# Patient Record
Sex: Male | Born: 1955 | Race: White | Hispanic: No | Smoking: Former smoker
Health system: Southern US, Community
[De-identification: ages and names within clinical notes are randomized; demographics above are authoritative.]

## PROBLEM LIST (undated history)

## (undated) DIAGNOSIS — F1010 Alcohol abuse, uncomplicated: Secondary | ICD-10-CM

## (undated) DIAGNOSIS — I4819 Other persistent atrial fibrillation: Secondary | ICD-10-CM

## (undated) DIAGNOSIS — I10 Essential (primary) hypertension: Secondary | ICD-10-CM

## (undated) DIAGNOSIS — I2510 Atherosclerotic heart disease of native coronary artery without angina pectoris: Secondary | ICD-10-CM

## (undated) DIAGNOSIS — F419 Anxiety disorder, unspecified: Secondary | ICD-10-CM

## (undated) DIAGNOSIS — R7303 Prediabetes: Secondary | ICD-10-CM

## (undated) DIAGNOSIS — I5022 Chronic systolic (congestive) heart failure: Secondary | ICD-10-CM

## (undated) DIAGNOSIS — R7989 Other specified abnormal findings of blood chemistry: Secondary | ICD-10-CM

## (undated) DIAGNOSIS — I499 Cardiac arrhythmia, unspecified: Secondary | ICD-10-CM

## (undated) HISTORY — PX: HERNIA REPAIR: SHX51

---

## 2012-06-09 ENCOUNTER — Ambulatory Visit: Payer: Self-pay | Admitting: Family Medicine

## 2012-06-09 VITALS — BP 138/88 | HR 76 | Temp 98.6°F | Resp 18 | Ht 73.0 in | Wt 377.6 lb

## 2012-06-09 DIAGNOSIS — Z021 Encounter for pre-employment examination: Secondary | ICD-10-CM

## 2012-06-09 DIAGNOSIS — Z0289 Encounter for other administrative examinations: Secondary | ICD-10-CM

## 2012-06-09 NOTE — Progress Notes (Signed)
     Patient Name: Arthur Church Date of Birth: 11/06/1956 Medical Record Number: [id number] Gender: male Date of Encounter: 06/09/2012  History of Present Illness:  Arthur Church is a 56 y.o. very pleasant male patient who presents with the following:  Here for self- pay DOT.  He is aware that he needs to lose weight.  Is getting back to formal work- he had been taking care of his grandkids for the past year, during which time he gained a lot of weight.  He did check that he tends to snore on his sheet- his wife noted that when he lost a significant amount of weight in the past that his snoring resolved.  However, he does not have daytime somnolence or known OSA. He has never been diagnosed with HTN  There is no problem list on file for this patient.  No past medical history on file. No past surgical history on file. History  Substance Use Topics  . Smoking status: Current Everyday Smoker    Types: Cigarettes  . Smokeless tobacco: Not on file   Comment: smokes about a week  . Alcohol Use: Not on file   No family history on file. No Known Allergies  Medication list has been reviewed and updated.  Prior to Admission medications   Medication Sig Start Date End Date Taking? Authorizing Provider  pseudoephedrine (SUDAFED) 30 MG tablet Take 30 mg by mouth every 4 (four) hours as needed.   Yes Historical Provider, MD    Review of Systems:  As per HPI- otherwise negative. Please see DOT history and exam form  Physical Examination: Filed Vitals:   06/09/12 1200  BP: 158/95  Pulse:   Temp:   Resp:    Filed Vitals:   06/09/12 1154  Height: 6\' 1"  (1.854 m)  Weight: 377 lb 9.6 oz (171.278 kg)   Body mass index is 49.82 kg/(m^2). Ideal Body Weight: Weight in (lb) to have BMI = 25: 189.1  Note recheck O2 sat 95%, done on pinky finger.  I think his other fingers are too large for probe to read properly   GEN: WDWN, NAD, Non-toxic, A & O x 3, morbid obesity HEENT:  Atraumatic, Normocephalic. Neck supple. No masses, No LAD.  TM and oropharynx wnl, PEERL Ears and Nose: No external deformity. CV: RRR, No M/G/R. No JVD. No thrill. No extra heart sounds. PULM: CTA B, no wheezes, crackles, rhonchi. No retractions. No resp. distress. No accessory muscle use. ABD: S, NT, ND, +BS. No rebound. No HSM. No inguinal hernia EXTR: No c/c/e NEURO Normal gait.  Full strength and normal DTR all extremities PSYCH: Normally interactive. Conversant. Not depressed or anxious appearing.  Calm demeanor.   Assessment and Plan: 1. Physical exam, pre-employment    Mr. Nobrega fulfills the DOT requirements for certification.  Will certify for only one year as he needs to attend to his weight, his BP is borderline, and he probably needs to have a sleep study to ensure that he does not have sleep apnea.  He plans to take care of these issues as soon as possible, and to establish with a local primary care provider.    Abbe Amsterdam, MD

## 2013-05-24 ENCOUNTER — Ambulatory Visit: Payer: Self-pay | Admitting: Family Medicine

## 2013-05-24 VITALS — BP 138/78 | HR 86 | Temp 98.5°F | Resp 16 | Ht 72.0 in | Wt 358.0 lb

## 2013-05-24 DIAGNOSIS — Z0289 Encounter for other administrative examinations: Secondary | ICD-10-CM

## 2013-05-24 NOTE — Progress Notes (Signed)
Subjective:    Patient ID: Arthur Church, male    DOB: [birth year], 57 y.o.   MRN: [id number]  HPI Arthur Church is a 57 y.o. male  here for DOT exam.   Last ov 06/09/12 - 1 year card given.  Went on diet as weight up to 425 in March - has lost about 50 pounds with Renaldo Fiddler Diet.  Weight 377 last year, 358 today. Denies recent daytime somnolence, no recent snoring - per wife. (stopped snoring past month, no daytime fatigue or naps).  Per notes at visit last year -  Certified for one year as BP borderline(lowest reading 138/88), and possible sleep study, as well as weight loss. Has not established care with primary provider. Has lost weight as above - 377 to 358.   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   Moved from [place] 2 years ago.   Review of Systems  Constitutional: Negative for fatigue.  Eyes: Negative for visual disturbance.  Respiratory: Negative for chest tightness.   Cardiovascular: Negative for chest pain.  Genitourinary: Negative for difficulty urinating.  Neurological: Negative for weakness.      Objective:   Physical Exam  Vitals reviewed. Constitutional: He is oriented to person, place, and time. He appears well-developed and well-nourished.  Obese.   HENT:  Head: Normocephalic and atraumatic.  Right Ear: External ear normal.  Left Ear: External ear normal.  Mouth/Throat: Oropharynx is clear and moist.  Eyes: Conjunctivae and lids are normal. Pupils are equal, round, and reactive to light. No foreign bodies found. 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 ). Left eye exhibits abnormal extraocular motion.  Neck: Normal range of motion. Neck supple. No thyromegaly present.  Cardiovascular: Normal rate, regular rhythm, normal heart sounds, intact distal pulses and normal pulses.   No murmur heard. Pulmonary/Chest: Effort normal and breath  sounds normal. No respiratory distress. He has no wheezes.  Abdominal: Soft. He exhibits no distension. There is no tenderness. Hernia confirmed negative in the right inguinal area and confirmed negative in the left inguinal area.  Genitourinary: Prostate normal.  Musculoskeletal: Normal range of motion. He exhibits no edema and no tenderness.       Cervical back: He exhibits normal range of motion.  Lymphadenopathy:    He has no cervical adenopathy.  Neurological: He is alert and oriented to person, place, and time. He has normal reflexes.  Skin: Skin is warm and dry.  Psychiatric: He has a normal mood and affect. His behavior is normal.   Filed Vitals:   05/24/13 1539 05/24/13 1557  BP: 182/108 168/80  Pulse: 86   Temp: 98.5 F (36.9 C)   TempSrc: Oral   Resp: 16   Height: 6' (1.829 m)   Weight: 358 lb (162.388 kg)   SpO2: 98%       Assessment & Plan:  Arthur Church is a 57 y.o. male  Health examination of defined subpopulation   DOT physical - elevated BP initially. Reviewed prior note on plan, and has not yet setup primary care, but has lost weight - and continuing to work on diet.  recheck BP in passing range, vision borderline but passes.  Denies any recent snoring or daytime fatigue.  recommended primary care for follow up of hematuria/proteinuria and borderline BP. 1 year card. See scanned paperwork.   Patient Instructions  1 year DOT card.  Schedule  primary care follow up in next 6 weeks if possible for borderline blood pressure, trace blood and protein in urine. Keep a record of your blood pressures outside of the office and bring them to the next office visit.

## 2013-05-24 NOTE — Patient Instructions (Addendum)
1 year DOT card.  Schedule primary care follow up in next 6 weeks if possible for borderline blood pressure, trace blood and protein in urine. Keep a record of your blood pressures outside of the office and bring them to the next office visit.

## 2013-07-01 ENCOUNTER — Encounter: Payer: Self-pay | Admitting: Family Medicine

## 2014-05-19 ENCOUNTER — Ambulatory Visit: Payer: Self-pay | Admitting: Internal Medicine

## 2014-05-19 VITALS — BP 140/84 | HR 81 | Temp 98.5°F | Resp 16 | Ht 71.73 in | Wt 326.1 lb

## 2014-05-19 DIAGNOSIS — Z0289 Encounter for other administrative examinations: Secondary | ICD-10-CM

## 2014-05-19 NOTE — Progress Notes (Signed)
   Subjective:    Patient ID: Arthur Church, male    DOB: 04-30-1956, 58 y.o.   MRN: [id number]  HPI Here for DOT. Has lost 30lbs over last 1 year. Is down 100lbs over last 2-64yrs. No snoring or day time sleepiness. Has no primary care and no health care coverage.   Review of Systems  Constitutional: Negative.   HENT: Negative.   Eyes: Negative.   Respiratory: Negative.   Cardiovascular: Negative.   Gastrointestinal: Negative.   Endocrine: Negative.   Genitourinary: Negative.   Allergic/Immunologic: Negative.   Neurological: Negative.   Hematological: Negative.   Psychiatric/Behavioral: Negative.        Objective:   Physical Exam  Constitutional: He is oriented to person, place, and time. He appears well-nourished. No distress.  HENT:  Head: Normocephalic.  Right Ear: External ear normal.  Left Ear: External ear normal.  Nose: Nose normal.  Mouth/Throat: Oropharynx is clear and moist.  Eyes: Conjunctivae and EOM are normal. Pupils are equal, round, and reactive to light. No scleral icterus.  Neck: Normal range of motion. Neck supple. No tracheal deviation present. No thyromegaly present.  Cardiovascular: Normal rate, regular rhythm and normal heart sounds.   Pulmonary/Chest: Effort normal and breath sounds normal.  Abdominal: Soft. There is no tenderness. Hernia confirmed negative in the right inguinal area and confirmed negative in the left inguinal area.  Genitourinary: Penis normal.  Musculoskeletal: Normal range of motion.  Lymphadenopathy:    He has no cervical adenopathy.  Neurological: He is alert and oriented to person, place, and time. He has normal reflexes. No cranial nerve deficit. He exhibits normal muscle tone. Coordination normal.  Psychiatric: He has a normal mood and affect. His behavior is normal. Judgment and thought content normal.     No results found for this or any previous visit.      Assessment & Plan:  Obesity improving  DOT ok for 2  yrs

## 2014-05-19 NOTE — Progress Notes (Signed)
   Subjective:    Patient ID: Arthur Church, male    DOB: 1956/05/29, 58 y.o.   MRN: [id number]  HPI Arthur Church is here today for his DOT physical. He dosen't have a PCP due to lack of insurance. He states that he continues to lose weight. He states he started at 419 lbs in which he is currently weighing 326.2 lbs. To his knowledge he has no health hx. Arthur Church states that his blood pressure occasionally runs high from time to time. But he hasn't been diagnosed with HTN. He also takes no regular medications. He has no complaints at this time.    Review of Systems     Objective:   Physical Exam        Assessment & Plan:

## 2015-03-04 ENCOUNTER — Inpatient Hospital Stay (HOSPITAL_COMMUNITY)
Admission: EM | Admit: 2015-03-04 | Discharge: 2015-03-10 | DRG: 308 | Disposition: A | Discharge status: Home / Self Care | Payer: 59 | Attending: Cardiology | Admitting: Cardiology

## 2015-03-04 ENCOUNTER — Encounter (HOSPITAL_COMMUNITY): Payer: Self-pay | Admitting: *Deleted

## 2015-03-04 ENCOUNTER — Other Ambulatory Visit (HOSPITAL_COMMUNITY): Payer: Self-pay

## 2015-03-04 ENCOUNTER — Emergency Department (HOSPITAL_COMMUNITY): Payer: 59

## 2015-03-04 DIAGNOSIS — L299 Pruritus, unspecified: Secondary | ICD-10-CM | POA: Diagnosis present

## 2015-03-04 DIAGNOSIS — R7303 Prediabetes: Secondary | ICD-10-CM | POA: Diagnosis present

## 2015-03-04 DIAGNOSIS — Z6841 Body Mass Index (BMI) 40.0 and over, adult: Secondary | ICD-10-CM

## 2015-03-04 DIAGNOSIS — E785 Hyperlipidemia, unspecified: Secondary | ICD-10-CM | POA: Diagnosis present

## 2015-03-04 DIAGNOSIS — I484 Atypical atrial flutter: Secondary | ICD-10-CM

## 2015-03-04 DIAGNOSIS — R197 Diarrhea, unspecified: Secondary | ICD-10-CM | POA: Diagnosis not present

## 2015-03-04 DIAGNOSIS — I4892 Unspecified atrial flutter: Secondary | ICD-10-CM | POA: Diagnosis present

## 2015-03-04 DIAGNOSIS — I5021 Acute systolic (congestive) heart failure: Secondary | ICD-10-CM | POA: Diagnosis present

## 2015-03-04 DIAGNOSIS — R7309 Other abnormal glucose: Secondary | ICD-10-CM | POA: Diagnosis present

## 2015-03-04 DIAGNOSIS — I4891 Unspecified atrial fibrillation: Secondary | ICD-10-CM | POA: Diagnosis present

## 2015-03-04 DIAGNOSIS — I472 Ventricular tachycardia: Secondary | ICD-10-CM | POA: Diagnosis present

## 2015-03-04 DIAGNOSIS — I509 Heart failure, unspecified: Secondary | ICD-10-CM

## 2015-03-04 DIAGNOSIS — R7989 Other specified abnormal findings of blood chemistry: Secondary | ICD-10-CM | POA: Diagnosis present

## 2015-03-04 DIAGNOSIS — R778 Other specified abnormalities of plasma proteins: Secondary | ICD-10-CM | POA: Diagnosis present

## 2015-03-04 DIAGNOSIS — I2510 Atherosclerotic heart disease of native coronary artery without angina pectoris: Secondary | ICD-10-CM | POA: Diagnosis present

## 2015-03-04 DIAGNOSIS — K5900 Constipation, unspecified: Secondary | ICD-10-CM | POA: Diagnosis present

## 2015-03-04 DIAGNOSIS — R079 Chest pain, unspecified: Secondary | ICD-10-CM | POA: Diagnosis not present

## 2015-03-04 DIAGNOSIS — E875 Hyperkalemia: Secondary | ICD-10-CM | POA: Diagnosis present

## 2015-03-04 DIAGNOSIS — I340 Nonrheumatic mitral (valve) insufficiency: Secondary | ICD-10-CM | POA: Diagnosis present

## 2015-03-04 DIAGNOSIS — F1010 Alcohol abuse, uncomplicated: Secondary | ICD-10-CM | POA: Diagnosis present

## 2015-03-04 DIAGNOSIS — I429 Cardiomyopathy, unspecified: Secondary | ICD-10-CM | POA: Diagnosis present

## 2015-03-04 DIAGNOSIS — I200 Unstable angina: Secondary | ICD-10-CM

## 2015-03-04 DIAGNOSIS — Z7901 Long term (current) use of anticoagulants: Secondary | ICD-10-CM

## 2015-03-04 DIAGNOSIS — G4733 Obstructive sleep apnea (adult) (pediatric): Secondary | ICD-10-CM | POA: Diagnosis present

## 2015-03-04 DIAGNOSIS — Z87891 Personal history of nicotine dependence: Secondary | ICD-10-CM

## 2015-03-04 DIAGNOSIS — I10 Essential (primary) hypertension: Secondary | ICD-10-CM | POA: Diagnosis present

## 2015-03-04 HISTORY — DX: Other specified abnormal findings of blood chemistry: R79.89

## 2015-03-04 HISTORY — DX: Prediabetes: R73.03

## 2015-03-04 HISTORY — DX: Alcohol abuse, uncomplicated: F10.10

## 2015-03-04 HISTORY — DX: Essential (primary) hypertension: I10

## 2015-03-04 HISTORY — DX: Morbid (severe) obesity due to excess calories: E66.01

## 2015-03-04 HISTORY — DX: Chronic systolic (congestive) heart failure: I50.22

## 2015-03-04 LAB — BASIC METABOLIC PANEL
Anion gap: 8 (ref 5–15)
BUN: 14 mg/dL (ref 6–23)
CO2: 28 mmol/L (ref 19–32)
Calcium: 9.6 mg/dL (ref 8.4–10.5)
Chloride: 103 mmol/L (ref 96–112)
Creatinine, Ser: 0.84 mg/dL (ref 0.50–1.35)
GFR calc Af Amer: >90 mL/min (ref >90)
Glucose, Bld: 133 mg/dL — ABNORMAL HIGH (ref 70–99)
POTASSIUM: 4.1 mmol/L (ref 3.5–5.1)
SODIUM: 139 mmol/L (ref 135–145)

## 2015-03-04 LAB — CBC
HEMATOCRIT: 45.3 % (ref 39.0–52.0)
Hemoglobin: 15 g/dL (ref 13.0–17.0)
MCH: 29.6 pg (ref 26.0–34.0)
MCHC: 33.1 g/dL (ref 30.0–36.0)
MCV: 89.5 fL (ref 78.0–100.0)
Platelets: 243 10*3/uL (ref 150–400)
RBC: 5.06 MIL/uL (ref 4.22–5.81)
RDW: 13.9 % (ref 11.5–15.5)
WBC: 10.5 10*3/uL (ref 4.0–10.5)

## 2015-03-04 LAB — I-STAT TROPONIN, ED: Troponin i, poc: 0.03 ng/mL (ref 0.00–0.08)

## 2015-03-04 LAB — BRAIN NATRIURETIC PEPTIDE: B Natriuretic Peptide: 410.6 pg/mL — ABNORMAL HIGH (ref 0.0–100.0)

## 2015-03-04 MED ORDER — ASPIRIN 81 MG PO CHEW
324.0000 mg | CHEWABLE_TABLET | Freq: Once | ORAL | Status: AC
  Administered 2015-03-04: 324 mg via ORAL
  Filled 2015-03-04: qty 4

## 2015-03-04 MED ORDER — HEPARIN (PORCINE) IN NACL 100-0.45 UNIT/ML-% IJ SOLN
2000.0000 [IU]/h | INTRAMUSCULAR | Status: DC
  Administered 2015-03-04: 1600 [IU]/h via INTRAVENOUS
  Administered 2015-03-05: 2000 [IU]/h via INTRAVENOUS
  Filled 2015-03-04 (×2): qty 250

## 2015-03-04 MED ORDER — HEPARIN (PORCINE) IN NACL 100-0.45 UNIT/ML-% IJ SOLN: 12.0000 [IU]/kg/h | Freq: Once | INTRAMUSCULAR | Status: DC

## 2015-03-04 MED ORDER — NITROGLYCERIN 2 % TD OINT
1.0000 [in_us] | TOPICAL_OINTMENT | Freq: Once | TRANSDERMAL | Status: AC
  Administered 2015-03-04: 1 [in_us] via TOPICAL
  Filled 2015-03-04: qty 1

## 2015-03-04 MED ORDER — HEPARIN SODIUM (PORCINE) 5000 UNIT/ML IJ SOLN
4000.0000 [IU] | Freq: Once | INTRAMUSCULAR | Status: AC
  Administered 2015-03-04: 4000 [IU] via INTRAVENOUS

## 2015-03-04 NOTE — ED Notes (Signed)
Pt in c/o intermittent chest pain for the last two weeks, worse today, today pt became diaphoretic and pale during pain, denies pain at this time, also reports trouble sleeping at night due to shortness of breath, also worse with exertion, no distress at this time

## 2015-03-04 NOTE — ED Provider Notes (Signed)
CSN: [id number]     Arrival date & time 03/04/15  2009 History   First MD Initiated Contact with Patient 03/04/15 2149     Chief Complaint  Patient presents with  . Chest Pain     (Consider location/radiation/quality/duration/timing/severity/associated sxs/prior Treatment) Patient is a 59 y.o. male presenting with chest pain. The history is provided by the patient.  Chest Pain Pain location:  Substernal area Pain quality: pressure   Pain radiates to:  L arm Pain radiates to the back: no   Pain severity:  Severe Onset quality:  Sudden Duration:  2 hours Timing:  Intermittent Progression:  Resolved Chronicity:  New Context: at rest   Relieved by:  None tried Worsened by:  Nothing tried Ineffective treatments:  None tried Associated symptoms: fatigue, lower extremity edema, nausea, orthopnea and shortness of breath   Associated symptoms: no cough, no palpitations and not vomiting   Risk factors: hypertension and obesity     Past Medical History  Diagnosis Date  . Hypertension    Past Surgical History  Procedure Laterality Date  . Hernia repair     History reviewed. No pertinent family history. History  Substance Use Topics  . Smoking status: Former Smoker    Types: Cigarettes  . Smokeless tobacco: Never Used     Comment: smokes about a week  . Alcohol Use: Yes    Review of Systems  Constitutional: Positive for fatigue.  Respiratory: Positive for shortness of breath. Negative for cough.   Cardiovascular: Positive for chest pain and orthopnea. Negative for palpitations.  Gastrointestinal: Positive for nausea. Negative for vomiting.  All other systems reviewed and are negative.     Allergies  Review of patient's allergies indicates no known allergies.  Home Medications   Prior to Admission medications   Medication Sig Start Date End Date Taking? Authorizing Provider  diphenhydrAMINE (BENADRYL) 25 MG tablet Take 25 mg by mouth every 6 (six) hours as needed  for allergies.   Yes Historical Provider, MD  ibuprofen (ADVIL,MOTRIN) 200 MG tablet Take 600 mg by mouth every 6 (six) hours as needed (pain).   Yes Historical Provider, MD  loratadine (CLARITIN) 10 MG tablet Take 10 mg by mouth daily.   Yes Historical Provider, MD  sodium chloride (OCEAN) 0.65 % SOLN nasal spray Place 1 spray into both nostrils as needed for congestion.   Yes Historical Provider, MD   BP 115/97 mmHg  Pulse 129  Temp(Src) 98 F (36.7 C) (Oral)  Resp 15  Ht 6\' 1"  (1.854 m)  Wt 370 lb (167.831 kg)  BMI 48.83 kg/m2  SpO2 94% Physical Exam  Constitutional: He is oriented to person, place, and time. He appears well-developed and well-nourished. No distress.  Obese male in no acute distress  HENT:  Head: Normocephalic and atraumatic.  Mouth/Throat: Oropharynx is clear and moist. No oropharyngeal exudate.  Eyes: EOM are normal. Pupils are equal, round, and reactive to light.  Neck: Normal range of motion. Neck supple. No thyromegaly present.  Cardiovascular: Normal heart sounds and intact distal pulses.  An irregularly irregular rhythm present. Tachycardia present.  Exam reveals no gallop and no friction rub.   No murmur heard. Pulmonary/Chest: Effort normal and breath sounds normal. No respiratory distress. He has no wheezes. He has no rales.  Abdominal: Soft. He exhibits no distension. There is no tenderness.  Obese  Musculoskeletal: Normal range of motion. He exhibits edema ( 1+ to the knee bilaterally). He exhibits no tenderness.  Lymphadenopathy:  He has no cervical adenopathy.  Neurological: He is alert and oriented to person, place, and time. No cranial nerve deficit.  Skin: Skin is warm and dry. No rash noted. He is not diaphoretic.  Psychiatric: He has a normal mood and affect. His behavior is normal. Judgment and thought content normal.  Nursing note and vitals reviewed.   ED Course  Procedures (including critical care time) Labs Review Labs Reviewed   BASIC METABOLIC PANEL - Abnormal; Notable for the following:    Glucose, Bld 133 (*)    All other components within normal limits  BRAIN NATRIURETIC PEPTIDE - Abnormal; Notable for the following:    B Natriuretic Peptide 410.6 (*)    All other components within normal limits  CBC  HEPARIN LEVEL (UNFRACTIONATED)  CBC  I-STAT TROPOININ, ED    Imaging Review Dg Chest 2 View  03/04/2015   CLINICAL DATA:  Chest pain and difficulty breathing since 4 p.m. today.  EXAM: CHEST  2 VIEW  COMPARISON:  None.  FINDINGS: The heart is borderline enlarged. There is tortuosity and mild ectasia of the thoracic aorta. There is peribronchial thickening and increased interstitial markings which could suggest chronic bronchitis, smoking changes, mild interstitial edema or interstitial pneumonitis. No pleural effusions. Streaky bibasilar atelectasis. The bony thorax is intact.  IMPRESSION: Borderline cardiac enlargement with acute versus chronic interstitial change as detailed above.   Electronically Signed   By: Rudie Meyer M.D.   On: 03/04/2015 20:50     EKG Interpretation   Date/Time:  Monday March 04 2015 20:15:18 EDT Ventricular Rate:  129 PR Interval:    QRS Duration: 90 QT Interval:  402 QTC Calculation: 588 R Axis:   13 Text Interpretation:  Critical Test Result: Long QTc Atrial flutter  with 2:1 A-V conduction Possible Anterior infarct , age undetermined  Marked ST abnormality, possible inferior subendocardial injury Abnormal  ECG Confirmed by BEATON  MD, ROBERT ([id number]) on 03/04/2015 11:21:04 PM      MDM   Final diagnoses:  Unstable angina  Atypical atrial flutter  Acute heart failure, unspecified heart failure type   59 year old male with past medical history of obesity and hypertension presents with chest pain, shortness of breath, orthopnea, lower extremity swelling. One episode 10 days ago was severe should chest pressure with radiation to his arm and diaphoresis and clamminess. He  had 2 episodes similar to that today. Over the last 2 weeks he is also had increasing symptoms of heart failure with virtually swelling, exertional dyspnea, orthopnea.  Atrial flutter with 2-1 conduction noted on arrival with a slower than expected rate at 125. Pain-free on arrival. Full dose aspirin given. Nitropaste also applied. Laboratory workup from triage was negative troponin, however BNP elevated at 410, and sides with ongoing symptoms of heart failure. His 2 episodes of angina today are concerning for unstable angina. Based on that, heparin bolus and infusion given. Cardiology consultation for admission. This patient is high risk for ACS and has evidence of heart failure. Given symptoms, also concern for unstable angina.   Dorna Leitz, MD 03/04/15 1610  Nelva Nay, MD 03/09/15 [phone]

## 2015-03-04 NOTE — Progress Notes (Signed)
ANTICOAGULATION CONSULT NOTE - Initial Consult  Pharmacy Consult for Heparin Indication: chest pain/ACS  No Known Allergies  Patient Measurements: Height: 6\' 1"  (185.4 cm) Weight: (!) 370 lb (167.831 kg) IBW/kg (Calculated) : 79.9 Heparin Dosing Weight: 120 kg  Vital Signs: Temp: 98 F (36.7 C) (03/21 2013) Temp Source: Oral (03/21 2013) BP: 149/103 mmHg (03/21 2215) Pulse Rate: 129 (03/21 2215)  Labs:  Recent Labs  03/04/15 2023  HGB 15.0  HCT 45.3  PLT 243  CREATININE 0.84    Estimated Creatinine Clearance: 158 mL/min (by C-G formula based on Cr of 0.84).   Medical History: Past Medical History  Diagnosis Date  . Hypertension     Medications:   (Not in a hospital admission) Scheduled:  . heparin  12 Units/kg/hr Intravenous Once  . heparin  4,000 Units Intravenous Once   Infusions:    Assessment: 59yo male with history of HTN presents with chest pain. Pharmacy is consulted to dose heparin for ACS/chest pain. CBC is wnl, sCr 0.8, BNP 410.6, Trop 0.03.  Goal of Therapy:  Heparin level 0.3-0.7 units/ml Monitor platelets by anticoagulation protocol: Yes   Plan:  Give 4000 units bolus x 1 Start heparin infusion at 1600 units/hr Check anti-Xa level in 6 hours and daily while on heparin Continue to monitor H&H and platelets   Arlean Hopping. Newman Pies, PharmD Clinical Pharmacist Pager [phone] 03/04/2015,10:23 PM

## 2015-03-05 DIAGNOSIS — Z87891 Personal history of nicotine dependence: Secondary | ICD-10-CM | POA: Diagnosis not present

## 2015-03-05 DIAGNOSIS — I10 Essential (primary) hypertension: Secondary | ICD-10-CM | POA: Diagnosis present

## 2015-03-05 DIAGNOSIS — Z6841 Body Mass Index (BMI) 40.0 and over, adult: Secondary | ICD-10-CM | POA: Diagnosis not present

## 2015-03-05 DIAGNOSIS — I509 Heart failure, unspecified: Secondary | ICD-10-CM

## 2015-03-05 DIAGNOSIS — I472 Ventricular tachycardia: Secondary | ICD-10-CM | POA: Diagnosis present

## 2015-03-05 DIAGNOSIS — I200 Unstable angina: Secondary | ICD-10-CM | POA: Diagnosis not present

## 2015-03-05 DIAGNOSIS — I5021 Acute systolic (congestive) heart failure: Secondary | ICD-10-CM | POA: Diagnosis present

## 2015-03-05 DIAGNOSIS — F1010 Alcohol abuse, uncomplicated: Secondary | ICD-10-CM | POA: Diagnosis present

## 2015-03-05 DIAGNOSIS — K5900 Constipation, unspecified: Secondary | ICD-10-CM | POA: Diagnosis present

## 2015-03-05 DIAGNOSIS — I4891 Unspecified atrial fibrillation: Secondary | ICD-10-CM | POA: Diagnosis present

## 2015-03-05 DIAGNOSIS — I429 Cardiomyopathy, unspecified: Secondary | ICD-10-CM | POA: Diagnosis present

## 2015-03-05 DIAGNOSIS — E875 Hyperkalemia: Secondary | ICD-10-CM | POA: Diagnosis present

## 2015-03-05 DIAGNOSIS — R079 Chest pain, unspecified: Secondary | ICD-10-CM | POA: Diagnosis present

## 2015-03-05 DIAGNOSIS — R197 Diarrhea, unspecified: Secondary | ICD-10-CM | POA: Diagnosis not present

## 2015-03-05 DIAGNOSIS — G478 Other sleep disorders: Secondary | ICD-10-CM | POA: Diagnosis not present

## 2015-03-05 DIAGNOSIS — E785 Hyperlipidemia, unspecified: Secondary | ICD-10-CM | POA: Diagnosis present

## 2015-03-05 DIAGNOSIS — I2510 Atherosclerotic heart disease of native coronary artery without angina pectoris: Secondary | ICD-10-CM | POA: Diagnosis present

## 2015-03-05 DIAGNOSIS — I483 Typical atrial flutter: Secondary | ICD-10-CM | POA: Diagnosis not present

## 2015-03-05 DIAGNOSIS — R7989 Other specified abnormal findings of blood chemistry: Secondary | ICD-10-CM | POA: Diagnosis not present

## 2015-03-05 DIAGNOSIS — G4733 Obstructive sleep apnea (adult) (pediatric): Secondary | ICD-10-CM | POA: Diagnosis present

## 2015-03-05 DIAGNOSIS — L299 Pruritus, unspecified: Secondary | ICD-10-CM | POA: Diagnosis present

## 2015-03-05 DIAGNOSIS — Z7901 Long term (current) use of anticoagulants: Secondary | ICD-10-CM | POA: Diagnosis not present

## 2015-03-05 DIAGNOSIS — R7309 Other abnormal glucose: Secondary | ICD-10-CM | POA: Diagnosis present

## 2015-03-05 DIAGNOSIS — R778 Other specified abnormalities of plasma proteins: Secondary | ICD-10-CM | POA: Diagnosis present

## 2015-03-05 DIAGNOSIS — I484 Atypical atrial flutter: Secondary | ICD-10-CM | POA: Diagnosis present

## 2015-03-05 DIAGNOSIS — I340 Nonrheumatic mitral (valve) insufficiency: Secondary | ICD-10-CM | POA: Diagnosis present

## 2015-03-05 DIAGNOSIS — I4892 Unspecified atrial flutter: Secondary | ICD-10-CM | POA: Diagnosis not present

## 2015-03-05 LAB — CBC
HEMATOCRIT: 42.4 % (ref 39.0–52.0)
Hemoglobin: 13.9 g/dL (ref 13.0–17.0)
MCH: 29.6 pg (ref 26.0–34.0)
MCHC: 32.8 g/dL (ref 30.0–36.0)
MCV: 90.2 fL (ref 78.0–100.0)
Platelets: 234 10*3/uL (ref 150–400)
RBC: 4.7 MIL/uL (ref 4.22–5.81)
RDW: 13.9 % (ref 11.5–15.5)
WBC: 8.4 10*3/uL (ref 4.0–10.5)

## 2015-03-05 LAB — TROPONIN I
TROPONIN I: 0.05 ng/mL — AB (ref <0.031)
TROPONIN I: 0.04 ng/mL — AB (ref <0.031)
Troponin I: 0.05 ng/mL — ABNORMAL HIGH (ref <0.031)

## 2015-03-05 LAB — TSH: TSH: 5.578 u[IU]/mL — AB (ref 0.350–4.500)

## 2015-03-05 LAB — LIPID PANEL
CHOL/HDL RATIO: 4.3 ratio
Cholesterol: 195 mg/dL (ref 0–200)
HDL: 45 mg/dL (ref >39)
LDL CALC: 134 mg/dL — AB (ref 0–99)
Triglycerides: 78 mg/dL (ref <150)
VLDL: 16 mg/dL (ref 0–40)

## 2015-03-05 LAB — MAGNESIUM: MAGNESIUM: 1.7 mg/dL (ref 1.5–2.5)

## 2015-03-05 LAB — HEPARIN LEVEL (UNFRACTIONATED)

## 2015-03-05 LAB — MRSA PCR SCREENING: MRSA by PCR: NEGATIVE

## 2015-03-05 MED ORDER — CETYLPYRIDINIUM CHLORIDE 0.05 % MT LIQD
7.0000 mL | Freq: Two times a day (BID) | OROMUCOSAL | Status: DC
  Administered 2015-03-06 – 2015-03-10 (×6): 7 mL via OROMUCOSAL

## 2015-03-05 MED ORDER — ATORVASTATIN CALCIUM 80 MG PO TABS
80.0000 mg | ORAL_TABLET | Freq: Every day | ORAL | Status: DC
  Administered 2015-03-05 – 2015-03-09 (×5): 80 mg via ORAL
  Filled 2015-03-05 (×6): qty 1

## 2015-03-05 MED ORDER — SODIUM CHLORIDE 0.9 % IJ SOLN: 3.0000 mL | INTRAMUSCULAR | Status: DC | PRN

## 2015-03-05 MED ORDER — DEXTROSE 5 % IV SOLN
5.0000 mg/h | INTRAVENOUS | Status: DC
  Administered 2015-03-05 – 2015-03-06 (×2): 7.5 mg/h via INTRAVENOUS
  Filled 2015-03-05: qty 100

## 2015-03-05 MED ORDER — FUROSEMIDE 10 MG/ML IJ SOLN
40.0000 mg | Freq: Two times a day (BID) | INTRAMUSCULAR | Status: DC
  Administered 2015-03-05 – 2015-03-07 (×5): 40 mg via INTRAVENOUS
  Filled 2015-03-05 (×7): qty 4

## 2015-03-05 MED ORDER — SODIUM CHLORIDE 0.9 % IV SOLN
INTRAVENOUS | Status: DC
  Administered 2015-03-06: 1000 mL via INTRAVENOUS
  Administered 2015-03-06: 500 mL via INTRAVENOUS

## 2015-03-05 MED ORDER — ONDANSETRON HCL 4 MG/2ML IJ SOLN
4.0000 mg | Freq: Four times a day (QID) | INTRAMUSCULAR | Status: DC | PRN
  Filled 2015-03-05: qty 2

## 2015-03-05 MED ORDER — ASPIRIN EC 81 MG PO TBEC
81.0000 mg | DELAYED_RELEASE_TABLET | Freq: Every day | ORAL | Status: DC
  Administered 2015-03-05 – 2015-03-06 (×2): 81 mg via ORAL
  Filled 2015-03-05 (×2): qty 1

## 2015-03-05 MED ORDER — APIXABAN 5 MG PO TABS
5.0000 mg | ORAL_TABLET | Freq: Two times a day (BID) | ORAL | Status: DC
  Administered 2015-03-05 – 2015-03-10 (×11): 5 mg via ORAL
  Filled 2015-03-05 (×13): qty 1

## 2015-03-05 MED ORDER — HEPARIN BOLUS VIA INFUSION
3000.0000 [IU] | Freq: Once | INTRAVENOUS | Status: AC
  Administered 2015-03-05: 3000 [IU] via INTRAVENOUS
  Filled 2015-03-05: qty 3000

## 2015-03-05 MED ORDER — DEXTROSE 5 % IV SOLN
5.0000 mg/h | INTRAVENOUS | Status: DC
  Administered 2015-03-05: 5 mg/h via INTRAVENOUS

## 2015-03-05 MED ORDER — SODIUM CHLORIDE 0.9 % IJ SOLN
3.0000 mL | Freq: Two times a day (BID) | INTRAMUSCULAR | Status: DC
  Administered 2015-03-05 – 2015-03-10 (×10): 3 mL via INTRAVENOUS

## 2015-03-05 MED ORDER — APIXABAN 5 MG PO TABS: 5.0000 mg | ORAL_TABLET | Freq: Two times a day (BID) | ORAL | Status: DC

## 2015-03-05 MED ORDER — METOPROLOL SUCCINATE 12.5 MG HALF TABLET
12.5000 mg | ORAL_TABLET | Freq: Every day | ORAL | Status: DC
  Administered 2015-03-05 – 2015-03-06 (×2): 12.5 mg via ORAL
  Filled 2015-03-05 (×2): qty 1

## 2015-03-05 MED ORDER — ACETAMINOPHEN 325 MG PO TABS: 650.0000 mg | ORAL_TABLET | ORAL | Status: DC | PRN

## 2015-03-05 MED ORDER — SODIUM CHLORIDE 0.9 % IV SOLN: 250.0000 mL | INTRAVENOUS | Status: DC | PRN

## 2015-03-05 NOTE — Care Management Note (Addendum)
    Page 1 of 2   03/08/2015     4:36:37 PM CARE MANAGEMENT NOTE 03/08/2015  Patient:  Arthur Church, Arthur Church   Account Number:  [id number]  Date Initiated:  03/05/2015  Documentation initiated by:  Junius Creamer  Subjective/Objective Assessment:   adm w heart failure     Action/Plan:   lives w wife   Anticipated DC Date:  03/09/2015   Anticipated DC Plan:  HOME W HOME HEALTH SERVICES      DC Planning Services  CM consult  [facility]  Medication Assistance      Choice offered to / List presented to:             Status of service:   Medicare Important Message given?  NA - LOS <3 / Initial given by admissions (If response is "NO", the following Medicare IM given date fields will be blank) Date Medicare IM given:   Medicare IM given by:   Date Additional Medicare IM given:   Additional Medicare IM given by:    Discharge Disposition:  HOME/SELF CARE  Per UR Regulation:  Reviewed for med. necessity/level of care/duration of stay  If discussed at Long Length of Stay Meetings, dates discussed:    Comments:  03/08/15 Sidney Ace, RN, BSN [phone] Pt has [facility] insurance, and is eligilble for Eliquis copay card--he will be able to obtain med for $10/month after card is activated. Sherryll Burger is not on [facility] formulary and would not be covered after 30 day free trial card. Called [facility] Patient Support Program; completed enrollment form for possible assistance.  Drug co.contact patient's insurance and see if med can be added to formulary.  They should contact me within 48h of faxing in info.  Will follow up with pt; he may be eligible for pt assistance program with drug co and be able to receive med for free.  Noted pt dc over the weekend.  Will follow up on an OP basis.  Pt plans to follow up with Dr. Windle Guard in [place] for PCP.  Office closed today-left message for them to call pt for new PCP appt.  Pt/wife appreciative of help.  03/08/15 Sidney Ace, RN, BSN  [phone]      S/W LAQUESHA @ [facility] @ [phone]  ELIQUIS 5MG  BID 30 DAY SUPPLY  COVER- YES CO-PAY- $ 160.00  30DAY SUPPLY TIER- 4 DRUG PRIOR APPROVAL - NO PHARMACY: [facility] AND [facility]  ENTRESTO 24-26 MG BID  30 DAY SUPPLY   ** NOT ON FORMULARY **    3/23  1108 debbie dowell rn,bsn placed sleep study order form on shadow chart if needed.  3/22 1150 debbie dowell rn,bsn left pt [facility] and inform on [facility]. will cont to follow. placed eliquis pt assist form on shadow chart. left 30day free eliquis card in room for pt.

## 2015-03-05 NOTE — H&P (Signed)
Patient ID: Arthur Church MRN: [id number], DOB/AGE: 01/07/1956   Admit date: 03/04/2015   Primary Physician: No PCP Per Patient Primary Cardiologist: None  Pt. Profile:  72 obese male former smoker with HTN who presents with dyspnea, weight gain, LE edema, and chest pain.  Problem List  Past Medical History  Diagnosis Date  . Hypertension     Past Surgical History  Procedure Laterality Date  . Hernia repair       Allergies  No Known Allergies  HPI  14 obese male former smoker with HTN who presents with dyspnea, weight gain, LE edema, and chest pain.  Arthur Church reports that about 3 weeks ago he developed dyspnea and PND. Since then, he has gained about 20lbs. Has had occasional cough with clear phlegm. Has been sleeping on 1 "big" pillow. Two weeks ago he had a "real bad" chest pain that lasted about 20 minutes which he described as a pressure that resolved with rest. The day of presentation he had another, longer, more severe episode with associated clamminess, shakes, and sweats. Has had nausea without vomiting. Although the CP was worrisome, his progressive dyspnea was what brought him to the ER. He stated he was unable to walk to the bathroom or couch without becoming severely dyspneic.    On arrival, Arthur Church was hypertensive to 165/107 mmHg, and tachycardic to 130bpm. 100% on RA  ECG demonstrated AFL with 2:1, rate 129 Possible old ASMI.Lateral STD. Labs were notable for K 4.1, Cr 0.84, BNP 410, POC TnI 0.03, TnI 0.05, CXR demonstrated bilateral insterstitial pulmonary edema.   He was give 324mg  PO ASA, 1 inch nitro paste, and started on a UFH drip.   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 His main medical care is his annual required physicals for work. He is married, no kids.Marland Kitchen His wife says that he snores heavily (she wears ear plugs) and he occasionally stops breathing. He drinks 10-12 shots of whiskey ~4 nights per week. He smoked 3/4 PPD  X 30 years  and quit 3 years ago. No FH of early CAD.  Home Medications  Prior to Admission medications   Medication Sig Start Date End Date Taking? Authorizing Provider  diphenhydrAMINE (BENADRYL) 25 MG tablet Take 25 mg by mouth every 6 (six) hours as needed for allergies.   Yes Historical Provider, MD  ibuprofen (ADVIL,MOTRIN) 200 MG tablet Take 600 mg by mouth every 6 (six) hours as needed (pain).   Yes Historical Provider, MD  loratadine (CLARITIN) 10 MG tablet Take 10 mg by mouth daily.   Yes Historical Provider, MD  sodium chloride (OCEAN) 0.65 % SOLN nasal spray Place 1 spray into both nostrils as needed for congestion.   Yes Historical Provider, MD    Family History  History reviewed. No pertinent family history.  Social History  History   Social History  . Marital Status: Married    Spouse Name: N/A  . Number of Children: N/A  . Years of Education: N/A   Occupational History  . Not on file.   Social History Main Topics  . Smoking status: Former Smoker    Types: Cigarettes  . Smokeless tobacco: Never Used     Comment: smokes about a week  . Alcohol Use: Yes  . Drug Use: No  . Sexual Activity: Not on file   Other Topics Concern  . Not on file   Social History Narrative     Review of  Systems General:  No chills, fever, night sweats. + 20# weight gain.  Cardiovascular: See HPI Dermatological: No rash, lesions/masses Respiratory: See HPI Urologic: No hematuria, dysuria Abdominal:   + nausea, no vomiting, diarrhea, bright red blood per rectum, melena, or hematemesis Neurologic:  No visual changes, wkns, changes in mental status. All other systems reviewed and are otherwise negative except as noted above.  Physical Exam  Blood pressure 133/93, pulse 129, temperature 98 F (36.7 C), temperature source Oral, resp. rate 19, height  (1.854 m), weight 167.831 kg (370 lb), SpO2 92 %.  General: Pleasant, NAD Psych: Normal affect. Neuro: Alert and oriented X 3. Moves  all extremities spontaneously. HEENT: Normal  Neck: Supple without bruits. JVD to mid neck while seated upright. Lungs:  Resp regular and unlabored, CTA. Heart: tachy, regular, no s3, s4, or murmurs. Abdomen: Soft, non-tender, non-distended, BS + x 4.  Extremities: No clubbing, cyanosis or 1+ LE edema. DP/PT/Radials 2+ and equal bilaterally.  Labs  Troponin [facility] of Care Test)  Recent Labs  03/04/15 2030  TROPIPOC 0.03   No results for input(s): CKTOTAL, CKMB, TROPONINI in the last 72 hours. Lab Results  Component Value Date   WBC 10.5 03/04/2015   HGB 15.0 03/04/2015   HCT 45.3 03/04/2015   MCV 89.5 03/04/2015   PLT 243 03/04/2015    Recent Labs Lab 03/04/15 2023  NA 139  K 4.1  CL 103  CO2 28  BUN 14  CREATININE 0.84  CALCIUM 9.6  GLUCOSE 133*   No results found for: CHOL, HDL, LDLCALC, TRIG No results found for: DDIMER   Radiology/Studies  Dg Chest 2 View  03/04/2015   CLINICAL DATA:  Chest pain and difficulty breathing since 4 p.m. today.  EXAM: CHEST  2 VIEW  COMPARISON:  None.  FINDINGS: The heart is borderline enlarged. There is tortuosity and mild ectasia of the thoracic aorta. There is peribronchial thickening and increased interstitial markings which could suggest chronic bronchitis, smoking changes, mild interstitial edema or interstitial pneumonitis. No pleural effusions. Streaky bibasilar atelectasis. The bony thorax is intact.  IMPRESSION: Borderline cardiac enlargement with acute versus chronic interstitial change as detailed above.   Electronically Signed   By: Rudie Meyer M.D.   On: 03/04/2015 20:50    ECG  03/04/15 @ 8:15pm: AFL with 2:1 block. Rate 129 Possible old ASMI. Lateral STD 03/04/15 @ 11:47: AFL with variable block. Rate 107. NSSTTWC  ASSESSMENT AND PLAN  60 obese male former smoker with HTN who presents with dyspnea, weight gain, LE edema, and chest pain. He has new onset congestive heart failure and atrial flutter. His chest pain  episodes are concerning for ACS. TnI is 0.05 -- it could all be related to HF and arrhythmia.   Acute, new onset HF. Unclear subtype. Given AFL without obvious symptoms, this raise the question of a tachy myopathy. He has risk factors for CAD as well (age, male, former smoker, HTN, obese). He has about 20lbs to be diuresed by his accounts. Warm and perfused.  1. Lasix  IV BID 2. TTE  AFL, new onset. Possible triggers may include obesity, OSA, CHF, thyroid disease. 1. dilt drip 2. UFH gtt with low dose metoprolol (latter given d/t possibility of CAD) 3. NPO for possible TEE DCCV 4. TSH 5. Outpatient sleep study  Chest pain - c/f ACS.  Prudent to treat as such. Based on results of TTE and troponin trend, we can determine whether he requires a cardiac cath or  stress test. 1. Metoprolol 12.5mg  BID 2. ASA 3. UFH 4. atorva  5. Lipids, A1c  Alcohol abuse. Denies history of withdrawal 1. Monitor for w/d  Signed, Glori Luis, MD 03/05/2015, 12:03 AM

## 2015-03-05 NOTE — Progress Notes (Signed)
ANTICOAGULATION CONSULT NOTE Pharmacy Consult for Heparin Indication: Aflutter/ACS  No Known Allergies  Patient Measurements: Height: 6\' 1"  (185.4 cm) Weight: (!) 366 lb 10 oz (166.3 kg) IBW/kg (Calculated) : 79.9 Heparin Dosing Weight: 120 kg  Vital Signs: Temp: 97.9 F (36.6 C) (03/22 0318) Temp Source: Oral (03/22 0318) BP: 148/102 mmHg (03/22 0315) Pulse Rate: 116 (03/22 0315)  Labs:  Recent Labs  03/04/15 2023 03/05/15 0107 03/05/15 0410  HGB 15.0  --  13.9  HCT 45.3  --  42.4  PLT 243  --  234  HEPARINUNFRC  --   --  <0.10*  CREATININE 0.84  --   --   TROPONINI  --  0.05*  --     Estimated Creatinine Clearance: 157.1 mL/min (by C-G formula based on Cr of 0.84).  Assessment: 59 y.o. male with SOB/chest pain for heparin  Goal of Therapy:  Heparin level 0.3-0.7 units/ml Monitor platelets by anticoagulation protocol: Yes   Plan:  Heparin 3000 units IV bolus, then increase heparin 2000 units/hr Check heparin level in 6 hours.   Geannie Risen, PharmD, BCPS 03/05/2015,5:27 AM

## 2015-03-05 NOTE — Progress Notes (Signed)
DAILY PROGRESS NOTE  Subjective:  No events overnight. No further chest pain. Breathing and leg swelling has improved. Troponin remains flat at 0.5, more consistent with CHF than acute coronary syndrome. Flutter rate is controlled on diltiazem.  Long discussion about risk factors - he snores loudly, is morbidly obese and has witnessed apnea - high likelihood of OSA. Also, suspect he has developed some degree of cardiomyopathy. He is also a moderate etoh user - 4 drinks/day, some 4-5x a week. He realizes he needs to quit alcohol and reduce salt intake.  Objective:  Temp:  [97.6 F (36.4 C)-98.7 F (37.1 C)] 97.6 F (36.4 C) (03/22 1139) Pulse Rate:  [66-132] 132 (03/22 1139) Resp:  [13-25] 25 (03/22 1139) BP: (115-165)/(62-124) 149/76 mmHg (03/22 1139) SpO2:  [92 %-100 %] 92 % (03/22 1139) Weight:  [366 lb 10 oz (166.3 kg)-370 lb (167.831 kg)] 366 lb 10 oz (166.3 kg) (03/22 0318) Weight change:   Intake/Output from previous day: 03/21 0701 - 03/22 0700 In: 205.1 [I.V.:205.1] Out: 600 [Urine:600]  Intake/Output from this shift: Total I/O In: -  Out: 475 [Urine:475]  Medications: Current Facility-Administered Medications  Medication Dose Route Frequency Provider Last Rate Last Dose  . 0.9 %  sodium chloride infusion  250 mL Intravenous PRN Lamar Sprinkles, MD 20 mL/hr at 03/05/15 0315 250 mL at 03/05/15 0315  . acetaminophen (TYLENOL) tablet 650 mg  650 mg Oral Q4H PRN Lamar Sprinkles, MD      . aspirin EC tablet 81 mg  81 mg Oral Daily Lamar Sprinkles, MD   81 mg at 03/05/15 0914  . atorvastatin (LIPITOR) tablet 80 mg  80 mg Oral q1800 Lamar Sprinkles, MD      . diltiazem (CARDIZEM) 100 mg in dextrose 5 % 100 mL (1 mg/mL) infusion  5-20 mg/hr Intravenous Titrated Lamar Sprinkles, MD 7.5 mL/hr at 03/05/15 0502 7.5 mg/hr at 03/05/15 0502  . furosemide (LASIX) injection 40 mg  40 mg Intravenous BID Lamar Sprinkles, MD   40 mg at 03/05/15 0913  . heparin ADULT infusion 100  units/mL (25000 units/250 mL)  2,000 Units/hr Intravenous Continuous Lamar Sprinkles, MD 20 mL/hr at 03/05/15 1138 2,000 Units/hr at 03/05/15 1138  . metoprolol succinate (TOPROL-XL) 24 hr tablet 12.5 mg  12.5 mg Oral Daily Lamar Sprinkles, MD   12.5 mg at 03/05/15 0914  . ondansetron (ZOFRAN) injection 4 mg  4 mg Intravenous Q6H PRN Lamar Sprinkles, MD      . sodium chloride 0.9 % injection 3 mL  3 mL Intravenous Q12H Lamar Sprinkles, MD      . sodium chloride 0.9 % injection 3 mL  3 mL Intravenous PRN Lamar Sprinkles, MD        Physical Exam: General appearance: alert, no distress and morbidly obese Neck: no carotid bruit, no JVD and neck thick Lungs: diminished breath sounds bilaterally Heart: irregularly irregular rhythm Abdomen: soft, non-tender; bowel sounds normal; no masses,  no organomegaly and morbidly obese Extremities: edema 1-2+ bilaterally Pulses: 2+ and symmetric Skin: pale, warm, dry Neurologic: Mental status: Alert, oriented, thought content appropriate, right eye exotropia Psych: Pleasant  Lab Results: Results for orders placed or performed during the hospital encounter of 03/04/15 (from the past 48 hour(s))  CBC     Status: None   Collection Time: 03/04/15  8:23 PM  Result Value Ref Range   WBC 10.5 4.0 - 10.5 K/uL   RBC 5.06 4.22 - 5.81 MIL/uL   Hemoglobin 15.0 13.0 - 17.0  g/dL   HCT 45.3 39.0 - 52.0 %   MCV 89.5 78.0 - 100.0 fL   MCH 29.6 26.0 - 34.0 pg   MCHC 33.1 30.0 - 36.0 g/dL   RDW 13.9 11.5 - 15.5 %   Platelets 243 150 - 400 K/uL  Basic metabolic panel     Status: Abnormal   Collection Time: 03/04/15  8:23 PM  Result Value Ref Range   Sodium 139 135 - 145 mmol/L   Potassium 4.1 3.5 - 5.1 mmol/L   Chloride 103 96 - 112 mmol/L   CO2 28 19 - 32 mmol/L   Glucose, Bld 133 (H) 70 - 99 mg/dL   BUN 14 6 - 23 mg/dL   Creatinine, Ser 0.84 0.50 - 1.35 mg/dL   Calcium 9.6 8.4 - 10.5 mg/dL   GFR calc non Af Amer >90 >90 mL/min   GFR calc Af Amer >90 >90  mL/min    Comment: (NOTE) The eGFR has been calculated using the CKD EPI equation. This calculation has not been validated in all clinical situations. eGFR's persistently <90 mL/min signify possible Chronic Kidney Disease.    Anion gap 8 5 - 15  BNP (order ONLY if patient complains of dyspnea/SOB AND you have documented it for THIS visit)     Status: Abnormal   Collection Time: 03/04/15  8:23 PM  Result Value Ref Range   B Natriuretic Peptide 410.6 (H) 0.0 - 100.0 pg/mL  I-stat troponin, ED (not at [facility])     Status: None   Collection Time: 03/04/15  8:30 PM  Result Value Ref Range   Troponin i, poc 0.03 0.00 - 0.08 ng/mL   Comment 3            Comment: Due to the release kinetics of cTnI, a negative result within the first hours of the onset of symptoms does not rule out myocardial infarction with certainty. If myocardial infarction is still suspected, repeat the test at appropriate intervals.   Magnesium     Status: None   Collection Time: 03/05/15  1:07 AM  Result Value Ref Range   Magnesium 1.7 1.5 - 2.5 mg/dL  Troponin I     Status: Abnormal   Collection Time: 03/05/15  1:07 AM  Result Value Ref Range   Troponin I 0.05 (H) <0.031 ng/mL    Comment:        PERSISTENTLY INCREASED TROPONIN VALUES IN THE RANGE OF 0.04-0.49 ng/mL CAN BE SEEN IN:       -UNSTABLE ANGINA       -CONGESTIVE HEART FAILURE       -MYOCARDITIS       -CHEST TRAUMA       -ARRYHTHMIAS       -LATE PRESENTING MYOCARDIAL INFARCTION       -COPD   CLINICAL FOLLOW-UP RECOMMENDED.   TSH     Status: Abnormal   Collection Time: 03/05/15  1:10 AM  Result Value Ref Range   TSH 5.578 (H) 0.350 - 4.500 uIU/mL  Lipid panel     Status: Abnormal   Collection Time: 03/05/15  1:11 AM  Result Value Ref Range   Cholesterol 195 0 - 200 mg/dL   Triglycerides 78 <150 mg/dL   HDL 45 >39 mg/dL   Total CHOL/HDL Ratio 4.3 RATIO   VLDL 16 0 - 40 mg/dL   LDL Cholesterol 134 (H) 0 - 99 mg/dL    Comment:        Total  Cholesterol/HDL:CHD Risk Coronary  Heart Disease Risk Table                     Men   Women  1/2 Average Risk   3.4   3.3  Average Risk       5.0   4.4  2 X Average Risk   9.6   7.1  3 X Average Risk  23.4   11.0        Use the calculated Patient Ratio above and the CHD Risk Table to determine the patient's CHD Risk.        ATP III CLASSIFICATION (LDL):  <100     mg/dL   Optimal  100-129  mg/dL   Near or Above                    Optimal  130-159  mg/dL   Borderline  160-189  mg/dL   High  >190     mg/dL   Very High   MRSA PCR Screening     Status: None   Collection Time: 03/05/15  3:16 AM  Result Value Ref Range   MRSA by PCR NEGATIVE NEGATIVE    Comment:        The GeneXpert MRSA Assay (FDA approved for NASAL specimens only), is one component of a comprehensive MRSA colonization surveillance program. It is not intended to diagnose MRSA infection nor to guide or monitor treatment for MRSA infections.   Heparin level (unfractionated)     Status: Abnormal   Collection Time: 03/05/15  4:10 AM  Result Value Ref Range   Heparin Unfractionated <0.10 (L) 0.30 - 0.70 IU/mL    Comment:        IF HEPARIN RESULTS ARE BELOW EXPECTED VALUES, AND PATIENT DOSAGE HAS BEEN CONFIRMED, SUGGEST FOLLOW UP TESTING OF ANTITHROMBIN III LEVELS.   CBC     Status: None   Collection Time: 03/05/15  4:10 AM  Result Value Ref Range   WBC 8.4 4.0 - 10.5 K/uL   RBC 4.70 4.22 - 5.81 MIL/uL   Hemoglobin 13.9 13.0 - 17.0 g/dL   HCT 42.4 39.0 - 52.0 %   MCV 90.2 78.0 - 100.0 fL   MCH 29.6 26.0 - 34.0 pg   MCHC 32.8 30.0 - 36.0 g/dL   RDW 13.9 11.5 - 15.5 %   Platelets 234 150 - 400 K/uL  Troponin I     Status: Abnormal   Collection Time: 03/05/15  7:40 AM  Result Value Ref Range   Troponin I 0.05 (H) <0.031 ng/mL    Comment:        PERSISTENTLY INCREASED TROPONIN VALUES IN THE RANGE OF 0.04-0.49 ng/mL CAN BE SEEN IN:       -UNSTABLE ANGINA       -CONGESTIVE HEART FAILURE        -MYOCARDITIS       -CHEST TRAUMA       -ARRYHTHMIAS       -LATE PRESENTING MYOCARDIAL INFARCTION       -COPD   CLINICAL FOLLOW-UP RECOMMENDED.     Imaging: Dg Chest 2 View  03/04/2015   CLINICAL DATA:  Chest pain and difficulty breathing since 4 p.m. today.  EXAM: CHEST  2 VIEW  COMPARISON:  None.  FINDINGS: The heart is borderline enlarged. There is tortuosity and mild ectasia of the thoracic aorta. There is peribronchial thickening and increased interstitial markings which could suggest chronic bronchitis, smoking changes, mild interstitial edema or interstitial pneumonitis.  No pleural effusions. Streaky bibasilar atelectasis. The bony thorax is intact.  IMPRESSION: Borderline cardiac enlargement with acute versus chronic interstitial change as detailed above.   Electronically Signed   By: Marijo Sanes M.D.   On: 03/04/2015 20:50    Assessment:  Principal Problem:   Atrial flutter Active Problems:   Congestive heart failure   Morbid obesity   Non-restorative sleep   Elevated troponin   ETOH abuse   Plan:  1. Atrial flutter - rate-controlled on diltiazem. Continue IV diltiazem today. No availability for TEE/cardioversion until tomorrow. Will switch heparin to Eliquis 5 mg BID. Should have 3 doses prior to cardioversion tomorrow. May wish to use b-blocker post-procedure if he has cardiomyopathy. 2. Congestive heart failure - suspect new, acute systolic congestive heart failure - possibly tachycardia related - however, cannot exclude ischemia or etoh related. Continue IV diuresis - nitrates. TEE tomorrow to assess LV function. 3. Morbid obesity - will need weight loss - major risk factor for OSA, which is suspected 4. Non-restorative sleep - wife reports heavy snoring and witnessed apnea - will order overnight oximetry. Will need a formal outpatient sleep study. Could be a major risk factor for his atrial flutter. Also, he has severe daytime fatigue and somnolence. 5. ETOH abuse - he  realizes etoh is excessive and says that he is going to quit . No history of withdrawal or prior admissions for detox. 6. Elevated troponin - suspect due to CHF.  Ultimately, will need an ischemia evaluation. If there is cardiomyopathy, a LHC would be reasonable.  Time Spent Directly with Patient:  45 minutes  Length of Stay:  LOS: 0 days   [name], MD, [facility] Attending Cardiologist [facility]  Neeraj Housand C 03/05/2015, 11:49 AM

## 2015-03-06 ENCOUNTER — Inpatient Hospital Stay (HOSPITAL_COMMUNITY): Payer: 59 | Admitting: Anesthesiology

## 2015-03-06 ENCOUNTER — Encounter (HOSPITAL_COMMUNITY)
Admission: EM | Disposition: A | Discharge status: Home / Self Care | Payer: Self-pay | Source: Home / Self Care | Attending: Cardiology

## 2015-03-06 ENCOUNTER — Encounter (HOSPITAL_COMMUNITY): Payer: Self-pay | Admitting: *Deleted

## 2015-03-06 DIAGNOSIS — I340 Nonrheumatic mitral (valve) insufficiency: Secondary | ICD-10-CM

## 2015-03-06 DIAGNOSIS — I4892 Unspecified atrial flutter: Secondary | ICD-10-CM

## 2015-03-06 HISTORY — PX: CARDIOVERSION: SHX1299

## 2015-03-06 HISTORY — PX: TEE WITHOUT CARDIOVERSION: SHX5443

## 2015-03-06 LAB — HEMOGLOBIN A1C
Hgb A1c MFr Bld: 6.1 % — ABNORMAL HIGH (ref 4.8–5.6)
Mean Plasma Glucose: 128 mg/dL

## 2015-03-06 LAB — BASIC METABOLIC PANEL
Anion gap: 11 (ref 5–15)
BUN: 6 mg/dL (ref 6–23)
CO2: 30 mmol/L (ref 19–32)
Calcium: 9 mg/dL (ref 8.4–10.5)
Chloride: 96 mmol/L (ref 96–112)
Creatinine, Ser: 0.93 mg/dL (ref 0.50–1.35)
GFR calc Af Amer: >90 mL/min (ref >90)
GLUCOSE: 96 mg/dL (ref 70–99)
Potassium: 4 mmol/L (ref 3.5–5.1)
SODIUM: 137 mmol/L (ref 135–145)

## 2015-03-06 LAB — CBC
HCT: 45 % (ref 39.0–52.0)
Hemoglobin: 14.5 g/dL (ref 13.0–17.0)
MCH: 29.2 pg (ref 26.0–34.0)
MCHC: 32.2 g/dL (ref 30.0–36.0)
MCV: 90.5 fL (ref 78.0–100.0)
Platelets: 265 10*3/uL (ref 150–400)
RBC: 4.97 MIL/uL (ref 4.22–5.81)
RDW: 13.9 % (ref 11.5–15.5)
WBC: 9.3 10*3/uL (ref 4.0–10.5)

## 2015-03-06 SURGERY — ECHOCARDIOGRAM, TRANSESOPHAGEAL
Anesthesia: Monitor Anesthesia Care

## 2015-03-06 MED ORDER — CARVEDILOL 6.25 MG PO TABS
6.2500 mg | ORAL_TABLET | Freq: Two times a day (BID) | ORAL | Status: DC
  Administered 2015-03-06 – 2015-03-07 (×3): 6.25 mg via ORAL
  Filled 2015-03-06 (×6): qty 1

## 2015-03-06 MED ORDER — PROPOFOL INFUSION 10 MG/ML OPTIME
INTRAVENOUS | Status: DC | PRN
  Administered 2015-03-06: 100 ug/kg/min via INTRAVENOUS

## 2015-03-06 MED ORDER — SACUBITRIL-VALSARTAN 24-26 MG PO TABS
1.0000 | ORAL_TABLET | Freq: Two times a day (BID) | ORAL | Status: DC
  Administered 2015-03-06 – 2015-03-09 (×6): 1 via ORAL
  Filled 2015-03-06 (×9): qty 1

## 2015-03-06 MED ORDER — MIDAZOLAM HCL 5 MG/5ML IJ SOLN
INTRAMUSCULAR | Status: DC | PRN
  Administered 2015-03-06: 2 mg via INTRAVENOUS

## 2015-03-06 MED ORDER — PROPOFOL 10 MG/ML IV BOLUS
INTRAVENOUS | Status: DC | PRN
  Administered 2015-03-06: 100 mg via INTRAVENOUS

## 2015-03-06 MED ORDER — MAGNESIUM HYDROXIDE 400 MG/5ML PO SUSP
30.0000 mL | Freq: Every day | ORAL | Status: DC | PRN
  Administered 2015-03-06: 30 mL via ORAL
  Filled 2015-03-06: qty 30

## 2015-03-06 MED ORDER — LACTATED RINGERS IV SOLN
INTRAVENOUS | Status: DC | PRN
  Administered 2015-03-06: 12:00:00 via INTRAVENOUS

## 2015-03-06 NOTE — Procedures (Addendum)
Electrical Cardioversion Procedure Note Arthur Church [id number] 26-Mar-1956  Procedure: Electrical Cardioversion Indications:  Atrial Flutter.  He has been on Eliquis and TEE showed no LAA thrombus.   Procedure Details Consent: Risks of procedure as well as the alternatives and risks of each were explained to the (patient/caregiver).  Consent for procedure obtained. Time Out: Verified patient identification, verified procedure, site/side was marked, verified correct patient position, special equipment/implants available, medications/allergies/relevent history reviewed, required imaging and test results available.  Performed  Patient placed on cardiac monitor, pulse oximetry, supplemental oxygen as necessary.  Sedation given: Propofol per anesthesiology Pacer pads placed anterior and posterior chest.  Cardioverted 1 time(s).  Cardioverted at 150J.  Evaluation Findings: Post procedure EKG shows: NSR Complications: None Patient did tolerate procedure well.   Marca Ancona 03/06/2015, 1:40 PM

## 2015-03-06 NOTE — Anesthesia Postprocedure Evaluation (Signed)
  Anesthesia Post-op Note  Patient: Arthur Church  Procedure(s) Performed: Procedure(s): TRANSESOPHAGEAL ECHOCARDIOGRAM (TEE) (N/A) CARDIOVERSION (N/A)  Patient Location: Endoscopy Unit  Anesthesia Type:MAC  Level of Consciousness: awake, alert  and oriented  Airway and Oxygen Therapy: Patient Spontanous Breathing and Patient connected to nasal cannula oxygen  Post-op Pain: none  Post-op Assessment: Post-op Vital signs reviewed, Patient's Cardiovascular Status Stable, Respiratory Function Stable, Patent Airway and No signs of Nausea or vomiting  Post-op Vital Signs: Reviewed and stable  Last Vitals:  Filed Vitals:   03/06/15 1355  BP: 150/81  Pulse: 81  Temp:   Resp: 16    Complications: No apparent anesthesia complications

## 2015-03-06 NOTE — Transfer of Care (Signed)
Immediate Anesthesia Transfer of Care Note  Patient: Arthur Church  Procedure(s) Performed: Procedure(s): TRANSESOPHAGEAL ECHOCARDIOGRAM (TEE) (N/A) CARDIOVERSION (N/A)  Patient Location: Endoscopy Unit  Anesthesia Type:MAC  Level of Consciousness: awake, alert  and oriented  Airway & Oxygen Therapy: Patient Spontanous Breathing and Patient connected to nasal cannula oxygen  Post-op Assessment: Report given to RN, Post -op Vital signs reviewed and stable and Patient moving all extremities X 4  Post vital signs: Reviewed and stable  Last Vitals:  Filed Vitals:   03/06/15 1355  BP: 150/81  Pulse: 81  Temp:   Resp: 16    Complications: No apparent anesthesia complications

## 2015-03-06 NOTE — CV Procedure (Signed)
Procedure: TEE  Indication: Atrial flutter with RVR  Sedation: Propofol per anesthesiology  Findings: Please see echo section for full report.  Normal LV size with severe global hypokinesis, EF 15-20%.  Normal wall thickness.  Normal RV size with mild to moderate systolic dysfunction.  There was mild mitral regurgitation.  Trivial TR.  No AS or AI, trileaflet aortic valve.  Mild left atrial enlargement with no LAA thrombus.  Normal caliber aorta with no significant plaque.    Will proceed to DCCV.   Arthur Church 03/06/2015 1:40 PM

## 2015-03-06 NOTE — H&P (View-Only) (Signed)
DAILY PROGRESS NOTE  Subjective:  No events overnight. Breathing has improved and swelling is less. He is almost -9L negative. I personally reviewed his bedside echo today and there is severe cardiomyopathy, EF 15-20% with global hypokinesis. Remains in atrial flutter with variable response on cardizem.  Objective:  Temp:  [97.6 F (36.4 C)-98.4 F (36.9 C)] 97.8 F (36.6 C) (03/23 0757) Pulse Rate:  [25-132] 71 (03/23 1023) Resp:  [14-25] 20 (03/23 0757) BP: (98-172)/(44-101) 151/101 mmHg (03/23 1023) SpO2:  [92 %-99 %] 99 % (03/23 0800) Weight:  [358 lb 0.4 oz (162.4 kg)] 358 lb 0.4 oz (162.4 kg) (03/23 0300) Weight change: -11 lb 15.6 oz (-5.431 kg)  Intake/Output from previous day: 03/22 0701 - 03/23 0700 In: 687 [I.V.:687] Out: 8000 [Urine:8000]  Intake/Output from this shift: Total I/O In: 27.5 [I.V.:27.5] Out: 1200 [Urine:1200]  Medications: Current Facility-Administered Medications  Medication Dose Route Frequency Provider Last Rate Last Dose  . 0.9 %  sodium chloride infusion  250 mL Intravenous PRN Lamar Sprinkles, MD 10 mL/hr at 03/05/15 1138 250 mL at 03/05/15 1138  . 0.9 %  sodium chloride infusion   Intravenous Continuous [name], MD 20 mL/hr at 03/06/15 0030 1,000 mL at 03/06/15 0030  . acetaminophen (TYLENOL) tablet 650 mg  650 mg Oral Q4H PRN Lamar Sprinkles, MD      . antiseptic oral rinse (CPC / CETYLPYRIDINIUM CHLORIDE 0.05%) solution 7 mL  7 mL Mouth Rinse BID Lamar Sprinkles, MD   7 mL at 03/06/15 0031  . apixaban (ELIQUIS) tablet 5 mg  5 mg Oral BID [name], MD   5 mg at 03/06/15 1023  . aspirin EC tablet 81 mg  81 mg Oral Daily Lamar Sprinkles, MD   81 mg at 03/06/15 1024  . atorvastatin (LIPITOR) tablet 80 mg  80 mg Oral q1800 Lamar Sprinkles, MD   80 mg at 03/05/15 1721  . diltiazem (CARDIZEM) 100 mg in dextrose 5 % 100 mL (1 mg/mL) infusion  5-15 mg/hr Intravenous Titrated [name], MD 7.5 mL/hr at 03/06/15 0305 7.5 mg/hr  at 03/06/15 0305  . furosemide (LASIX) injection 40 mg  40 mg Intravenous BID Lamar Sprinkles, MD   40 mg at 03/06/15 0758  . magnesium hydroxide (MILK OF MAGNESIA) suspension 30 mL  30 mL Oral Daily PRN Lamar Sprinkles, MD      . metoprolol succinate (TOPROL-XL) 24 hr tablet 12.5 mg  12.5 mg Oral Daily Lamar Sprinkles, MD   12.5 mg at 03/06/15 1024  . ondansetron (ZOFRAN) injection 4 mg  4 mg Intravenous Q6H PRN Lamar Sprinkles, MD      . sodium chloride 0.9 % injection 3 mL  3 mL Intravenous Q12H Lamar Sprinkles, MD   3 mL at 03/06/15 1027  . sodium chloride 0.9 % injection 3 mL  3 mL Intravenous PRN Lamar Sprinkles, MD        Physical Exam: General appearance: alert, no distress and morbidly obese Neck: no carotid bruit, no JVD and neck thick Lungs: diminished breath sounds bilaterally Heart: irregularly irregular rhythm Abdomen: soft, non-tender; bowel sounds normal; no masses,  no organomegaly and morbidly obese Extremities: edema 1+ bilaterally Pulses: 2+ and symmetric Skin: pale, warm, dry Neurologic: Mental status: Alert, oriented, thought content appropriate, right eye exotropia Psych: Pleasant  Lab Results: Results for orders placed or performed during the hospital encounter of 03/04/15 (from the past 48 hour(s))  CBC     Status: None  Collection Time: 03/04/15  8:23 PM  Result Value Ref Range   WBC 10.5 4.0 - 10.5 K/uL   RBC 5.06 4.22 - 5.81 MIL/uL   Hemoglobin 15.0 13.0 - 17.0 g/dL   HCT 45.3 39.0 - 52.0 %   MCV 89.5 78.0 - 100.0 fL   MCH 29.6 26.0 - 34.0 pg   MCHC 33.1 30.0 - 36.0 g/dL   RDW 13.9 11.5 - 15.5 %   Platelets 243 150 - 400 K/uL  Basic metabolic panel     Status: Abnormal   Collection Time: 03/04/15  8:23 PM  Result Value Ref Range   Sodium 139 135 - 145 mmol/L   Potassium 4.1 3.5 - 5.1 mmol/L   Chloride 103 96 - 112 mmol/L   CO2 28 19 - 32 mmol/L   Glucose, Bld 133 (H) 70 - 99 mg/dL   BUN 14 6 - 23 mg/dL   Creatinine, Ser 0.84 0.50 - 1.35 mg/dL     Calcium 9.6 8.4 - 10.5 mg/dL   GFR calc non Af Amer >90 >90 mL/min   GFR calc Af Amer >90 >90 mL/min    Comment: (NOTE) The eGFR has been calculated using the CKD EPI equation. This calculation has not been validated in all clinical situations. eGFR's persistently <90 mL/min signify possible Chronic Kidney Disease.    Anion gap 8 5 - 15  BNP (order ONLY if patient complains of dyspnea/SOB AND you have documented it for THIS visit)     Status: Abnormal   Collection Time: 03/04/15  8:23 PM  Result Value Ref Range   B Natriuretic Peptide 410.6 (H) 0.0 - 100.0 pg/mL  I-stat troponin, ED (not at [facility])     Status: None   Collection Time: 03/04/15  8:30 PM  Result Value Ref Range   Troponin i, poc 0.03 0.00 - 0.08 ng/mL   Comment 3            Comment: Due to the release kinetics of cTnI, a negative result within the first hours of the onset of symptoms does not rule out myocardial infarction with certainty. If myocardial infarction is still suspected, repeat the test at appropriate intervals.   Magnesium     Status: None   Collection Time: 03/05/15  1:07 AM  Result Value Ref Range   Magnesium 1.7 1.5 - 2.5 mg/dL  Troponin I     Status: Abnormal   Collection Time: 03/05/15  1:07 AM  Result Value Ref Range   Troponin I 0.05 (H) <0.031 ng/mL    Comment:        PERSISTENTLY INCREASED TROPONIN VALUES IN THE RANGE OF 0.04-0.49 ng/mL CAN BE SEEN IN:       -UNSTABLE ANGINA       -CONGESTIVE HEART FAILURE       -MYOCARDITIS       -CHEST TRAUMA       -ARRYHTHMIAS       -LATE PRESENTING MYOCARDIAL INFARCTION       -COPD   CLINICAL FOLLOW-UP RECOMMENDED.   TSH     Status: Abnormal   Collection Time: 03/05/15  1:10 AM  Result Value Ref Range   TSH 5.578 (H) 0.350 - 4.500 uIU/mL  Hemoglobin A1c     Status: Abnormal   Collection Time: 03/05/15  1:11 AM  Result Value Ref Range   Hgb A1c MFr Bld 6.1 (H) 4.8 - 5.6 %    Comment: (NOTE)         Pre-diabetes:  5.7 - 6.4          Diabetes: >6.4         Glycemic control for adults with diabetes: <7.0    Mean Plasma Glucose 128 mg/dL    Comment: (NOTE) Performed At: [facility] [place], [place] [id number] Lindon Romp MD OF:[id number]   Lipid panel     Status: Abnormal   Collection Time: 03/05/15  1:11 AM  Result Value Ref Range   Cholesterol 195 0 - 200 mg/dL   Triglycerides 78 <150 mg/dL   HDL 45 >39 mg/dL   Total CHOL/HDL Ratio 4.3 RATIO   VLDL 16 0 - 40 mg/dL   LDL Cholesterol 134 (H) 0 - 99 mg/dL    Comment:        Total Cholesterol/HDL:CHD Risk Coronary Heart Disease Risk Table                     Men   Women  1/2 Average Risk   3.4   3.3  Average Risk       5.0   4.4  2 X Average Risk   9.6   7.1  3 X Average Risk  23.4   11.0        Use the calculated Patient Ratio above and the CHD Risk Table to determine the patient's CHD Risk.        ATP III CLASSIFICATION (LDL):  <100     mg/dL   Optimal  100-129  mg/dL   Near or Above                    Optimal  130-159  mg/dL   Borderline  160-189  mg/dL   High  >190     mg/dL   Very High   MRSA PCR Screening     Status: None   Collection Time: 03/05/15  3:16 AM  Result Value Ref Range   MRSA by PCR NEGATIVE NEGATIVE    Comment:        The GeneXpert MRSA Assay (FDA approved for NASAL specimens only), is one component of a comprehensive MRSA colonization surveillance program. It is not intended to diagnose MRSA infection nor to guide or monitor treatment for MRSA infections.   Heparin level (unfractionated)     Status: Abnormal   Collection Time: 03/05/15  4:10 AM  Result Value Ref Range   Heparin Unfractionated <0.10 (L) 0.30 - 0.70 IU/mL    Comment:        IF HEPARIN RESULTS ARE BELOW EXPECTED VALUES, AND PATIENT DOSAGE HAS BEEN CONFIRMED, SUGGEST FOLLOW UP TESTING OF ANTITHROMBIN III LEVELS.   CBC     Status: None   Collection Time: 03/05/15  4:10 AM  Result Value Ref Range   WBC 8.4 4.0 - 10.5 K/uL    RBC 4.70 4.22 - 5.81 MIL/uL   Hemoglobin 13.9 13.0 - 17.0 g/dL   HCT 42.4 39.0 - 52.0 %   MCV 90.2 78.0 - 100.0 fL   MCH 29.6 26.0 - 34.0 pg   MCHC 32.8 30.0 - 36.0 g/dL   RDW 13.9 11.5 - 15.5 %   Platelets 234 150 - 400 K/uL  Troponin I     Status: Abnormal   Collection Time: 03/05/15  7:40 AM  Result Value Ref Range   Troponin I 0.05 (H) <0.031 ng/mL    Comment:        PERSISTENTLY INCREASED TROPONIN VALUES IN THE RANGE  OF 0.04-0.49 ng/mL CAN BE SEEN IN:       -UNSTABLE ANGINA       -CONGESTIVE HEART FAILURE       -MYOCARDITIS       -CHEST TRAUMA       -ARRYHTHMIAS       -LATE PRESENTING MYOCARDIAL INFARCTION       -COPD   CLINICAL FOLLOW-UP RECOMMENDED.   Troponin I     Status: Abnormal   Collection Time: 03/05/15  1:15 PM  Result Value Ref Range   Troponin I 0.04 (H) <0.031 ng/mL    Comment:        PERSISTENTLY INCREASED TROPONIN VALUES IN THE RANGE OF 0.04-0.49 ng/mL CAN BE SEEN IN:       -UNSTABLE ANGINA       -CONGESTIVE HEART FAILURE       -MYOCARDITIS       -CHEST TRAUMA       -ARRYHTHMIAS       -LATE PRESENTING MYOCARDIAL INFARCTION       -COPD   CLINICAL FOLLOW-UP RECOMMENDED.   CBC     Status: None   Collection Time: 03/06/15  3:09 AM  Result Value Ref Range   WBC 9.3 4.0 - 10.5 K/uL   RBC 4.97 4.22 - 5.81 MIL/uL   Hemoglobin 14.5 13.0 - 17.0 g/dL   HCT 45.0 39.0 - 52.0 %   MCV 90.5 78.0 - 100.0 fL   MCH 29.2 26.0 - 34.0 pg   MCHC 32.2 30.0 - 36.0 g/dL   RDW 13.9 11.5 - 15.5 %   Platelets 265 150 - 400 K/uL  Basic metabolic panel     Status: None   Collection Time: 03/06/15  3:09 AM  Result Value Ref Range   Sodium 137 135 - 145 mmol/L   Potassium 4.0 3.5 - 5.1 mmol/L   Chloride 96 96 - 112 mmol/L   CO2 30 19 - 32 mmol/L   Glucose, Bld 96 70 - 99 mg/dL   BUN 6 6 - 23 mg/dL   Creatinine, Ser 0.93 0.50 - 1.35 mg/dL   Calcium 9.0 8.4 - 10.5 mg/dL   GFR calc non Af Amer >90 >90 mL/min   GFR calc Af Amer >90 >90 mL/min    Comment:  (NOTE) The eGFR has been calculated using the CKD EPI equation. This calculation has not been validated in all clinical situations. eGFR's persistently <90 mL/min signify possible Chronic Kidney Disease.    Anion gap 11 5 - 15    Imaging: Dg Chest 2 View  03/04/2015   CLINICAL DATA:  Chest pain and difficulty breathing since 4 p.m. today.  EXAM: CHEST  2 VIEW  COMPARISON:  None.  FINDINGS: The heart is borderline enlarged. There is tortuosity and mild ectasia of the thoracic aorta. There is peribronchial thickening and increased interstitial markings which could suggest chronic bronchitis, smoking changes, mild interstitial edema or interstitial pneumonitis. No pleural effusions. Streaky bibasilar atelectasis. The bony thorax is intact.  IMPRESSION: Borderline cardiac enlargement with acute versus chronic interstitial change as detailed above.   Electronically Signed   By: Marijo Sanes M.D.   On: 03/04/2015 20:50    Assessment:  Principal Problem:   Atrial flutter Active Problems:   Congestive heart failure   Morbid obesity   Non-restorative sleep   Elevated troponin   ETOH abuse   Acute heart failure   Plan:  1. Atrial flutter - CHADSVASC score is 2 (Systolic CHF, HTN). He is  rate-controlled on diltiazem - preliminary bedside echo shows EF of 15-20%, global hypokinesis. Continue Eliquis 5 mg BID. Plan for TEE/Cardioversion this afternoon. 2. Acute systolic congestive heart failure, NYHA Class IV symptoms- LVEF at bedside is around 15-20% with global hypokinesis. Diuresing well on IV lasix. Will switch to po b-blocker (carvediolol) after cardioversion. Renal function is normal. He is ACE-I naive.  Will also start Entresto 24-26 mg BID tonight. 3. Morbid obesity - will need weight loss - major risk factor for OSA, which is suspected 4. Non-restorative sleep - wife reports heavy snoring and witnessed apnea - will order overnight oximetry. Will need a formal outpatient sleep study.  Could be a major risk factor for his atrial flutter. Also, he has severe daytime fatigue and somnolence. 5. ETOH abuse - he realizes etoh is excessive and says that he is going to quit . No history of withdrawal or prior admissions for detox. 6. Elevated troponin - suspect due to CHF.  Ultimately, will need an ischemia evaluation - likely LHC. 7. Constipation - given MOM. Consider miralax PRN.  Time Spent Directly with Patient:  45 minutes  Length of Stay:  LOS: 1 day   [name], MD, [facility] Attending Cardiologist [facility]  Rayn Shorb C 03/06/2015, 11:15 AM

## 2015-03-06 NOTE — Anesthesia Procedure Notes (Signed)
Procedure Name: MAC Date/Time: 03/06/2015 1:15 PM Performed by: Carmela Rima Pre-anesthesia Checklist: Timeout performed, Patient identified, Emergency Drugs available, Suction available and Patient being monitored Patient Re-evaluated:Patient Re-evaluated prior to induction

## 2015-03-06 NOTE — Progress Notes (Signed)
Echocardiogram Echocardiogram Transesophageal has been performed.  Arthur Church M 03/06/2015, 2:07 PM

## 2015-03-06 NOTE — Progress Notes (Signed)
Echocardiogram 2D Echocardiogram has been performed.  Arthur Church 03/06/2015, 11:25 AM

## 2015-03-06 NOTE — Anesthesia Preprocedure Evaluation (Addendum)
Anesthesia Evaluation    Reviewed: NPO status , Patient's Chart, lab work & pertinent test results  History of Anesthesia Complications Negative for: history of anesthetic complications  Airway        Dental   Pulmonary sleep apnea , former smoker,          Cardiovascular hypertension, Pt. on medications and Pt. on home beta blockers +CHF + dysrhythmias Atrial Fibrillation     Neuro/Psych negative neurological ROS  negative psych ROS   GI/Hepatic negative GI ROS, Neg liver ROS,   Endo/Other  Morbid obesity  Renal/GU negative Renal ROS     Musculoskeletal   Abdominal   Peds  Hematology   Anesthesia Other Findings   Reproductive/Obstetrics                            Anesthesia Physical Anesthesia Plan  ASA: III  Anesthesia Plan: MAC   Post-op Pain Management:    Induction: Intravenous  Airway Management Planned: Simple Face Mask  Additional Equipment:   Intra-op Plan:   Post-operative Plan:   Informed Consent:   Plan Discussed with:   Anesthesia Plan Comments:         Anesthesia Quick Evaluation

## 2015-03-06 NOTE — Progress Notes (Signed)
DAILY PROGRESS NOTE  Subjective:  No events overnight. Breathing has improved and swelling is less. He is almost -9L negative. I personally reviewed his bedside echo today and there is severe cardiomyopathy, EF 15-20% with global hypokinesis. Remains in atrial flutter with variable response on cardizem.  Objective:  Temp:  [97.6 F (36.4 C)-98.4 F (36.9 C)] 97.8 F (36.6 C) (03/23 0757) Pulse Rate:  [25-132] 71 (03/23 1023) Resp:  [14-25] 20 (03/23 0757) BP: (98-172)/(44-101) 151/101 mmHg (03/23 1023) SpO2:  [92 %-99 %] 99 % (03/23 0800) Weight:  [358 lb 0.4 oz (162.4 kg)] 358 lb 0.4 oz (162.4 kg) (03/23 0300) Weight change: -11 lb 15.6 oz (-5.431 kg)  Intake/Output from previous day: 03/22 0701 - 03/23 0700 In: 687 [I.V.:687] Out: 8000 [Urine:8000]  Intake/Output from this shift: Total I/O In: 27.5 [I.V.:27.5] Out: 1200 [Urine:1200]  Medications: Current Facility-Administered Medications  Medication Dose Route Frequency Provider Last Rate Last Dose  . 0.9 %  sodium chloride infusion  250 mL Intravenous PRN Lamar Sprinkles, MD 10 mL/hr at 03/05/15 1138 250 mL at 03/05/15 1138  . 0.9 %  sodium chloride infusion   Intravenous Continuous [name], MD 20 mL/hr at 03/06/15 0030 1,000 mL at 03/06/15 0030  . acetaminophen (TYLENOL) tablet 650 mg  650 mg Oral Q4H PRN Lamar Sprinkles, MD      . antiseptic oral rinse (CPC / CETYLPYRIDINIUM CHLORIDE 0.05%) solution 7 mL  7 mL Mouth Rinse BID Lamar Sprinkles, MD   7 mL at 03/06/15 0031  . apixaban (ELIQUIS) tablet 5 mg  5 mg Oral BID [name], MD   5 mg at 03/06/15 1023  . aspirin EC tablet 81 mg  81 mg Oral Daily Lamar Sprinkles, MD   81 mg at 03/06/15 1024  . atorvastatin (LIPITOR) tablet 80 mg  80 mg Oral q1800 Lamar Sprinkles, MD   80 mg at 03/05/15 1721  . diltiazem (CARDIZEM) 100 mg in dextrose 5 % 100 mL (1 mg/mL) infusion  5-15 mg/hr Intravenous Titrated [name], MD 7.5 mL/hr at 03/06/15 0305 7.5 mg/hr  at 03/06/15 0305  . furosemide (LASIX) injection 40 mg  40 mg Intravenous BID Lamar Sprinkles, MD   40 mg at 03/06/15 0758  . magnesium hydroxide (MILK OF MAGNESIA) suspension 30 mL  30 mL Oral Daily PRN Lamar Sprinkles, MD      . metoprolol succinate (TOPROL-XL) 24 hr tablet 12.5 mg  12.5 mg Oral Daily Lamar Sprinkles, MD   12.5 mg at 03/06/15 1024  . ondansetron (ZOFRAN) injection 4 mg  4 mg Intravenous Q6H PRN Lamar Sprinkles, MD      . sodium chloride 0.9 % injection 3 mL  3 mL Intravenous Q12H Lamar Sprinkles, MD   3 mL at 03/06/15 1027  . sodium chloride 0.9 % injection 3 mL  3 mL Intravenous PRN Lamar Sprinkles, MD        Physical Exam: General appearance: alert, no distress and morbidly obese Neck: no carotid bruit, no JVD and neck thick Lungs: diminished breath sounds bilaterally Heart: irregularly irregular rhythm Abdomen: soft, non-tender; bowel sounds normal; no masses,  no organomegaly and morbidly obese Extremities: edema 1+ bilaterally Pulses: 2+ and symmetric Skin: pale, warm, dry Neurologic: Mental status: Alert, oriented, thought content appropriate, right eye exotropia Psych: Pleasant  Lab Results: Results for orders placed or performed during the hospital encounter of 03/04/15 (from the past 48 hour(s))  CBC     Status: None  Collection Time: 03/04/15  8:23 PM  Result Value Ref Range   WBC 10.5 4.0 - 10.5 K/uL   RBC 5.06 4.22 - 5.81 MIL/uL   Hemoglobin 15.0 13.0 - 17.0 g/dL   HCT 45.3 39.0 - 52.0 %   MCV 89.5 78.0 - 100.0 fL   MCH 29.6 26.0 - 34.0 pg   MCHC 33.1 30.0 - 36.0 g/dL   RDW 13.9 11.5 - 15.5 %   Platelets 243 150 - 400 K/uL  Basic metabolic panel     Status: Abnormal   Collection Time: 03/04/15  8:23 PM  Result Value Ref Range   Sodium 139 135 - 145 mmol/L   Potassium 4.1 3.5 - 5.1 mmol/L   Chloride 103 96 - 112 mmol/L   CO2 28 19 - 32 mmol/L   Glucose, Bld 133 (H) 70 - 99 mg/dL   BUN 14 6 - 23 mg/dL   Creatinine, Ser 0.84 0.50 - 1.35 mg/dL     Calcium 9.6 8.4 - 10.5 mg/dL   GFR calc non Af Amer >90 >90 mL/min   GFR calc Af Amer >90 >90 mL/min    Comment: (NOTE) The eGFR has been calculated using the CKD EPI equation. This calculation has not been validated in all clinical situations. eGFR's persistently <90 mL/min signify possible Chronic Kidney Disease.    Anion gap 8 5 - 15  BNP (order ONLY if patient complains of dyspnea/SOB AND you have documented it for THIS visit)     Status: Abnormal   Collection Time: 03/04/15  8:23 PM  Result Value Ref Range   B Natriuretic Peptide 410.6 (H) 0.0 - 100.0 pg/mL  I-stat troponin, ED (not at [facility])     Status: None   Collection Time: 03/04/15  8:30 PM  Result Value Ref Range   Troponin i, poc 0.03 0.00 - 0.08 ng/mL   Comment 3            Comment: Due to the release kinetics of cTnI, a negative result within the first hours of the onset of symptoms does not rule out myocardial infarction with certainty. If myocardial infarction is still suspected, repeat the test at appropriate intervals.   Magnesium     Status: None   Collection Time: 03/05/15  1:07 AM  Result Value Ref Range   Magnesium 1.7 1.5 - 2.5 mg/dL  Troponin I     Status: Abnormal   Collection Time: 03/05/15  1:07 AM  Result Value Ref Range   Troponin I 0.05 (H) <0.031 ng/mL    Comment:        PERSISTENTLY INCREASED TROPONIN VALUES IN THE RANGE OF 0.04-0.49 ng/mL CAN BE SEEN IN:       -UNSTABLE ANGINA       -CONGESTIVE HEART FAILURE       -MYOCARDITIS       -CHEST TRAUMA       -ARRYHTHMIAS       -LATE PRESENTING MYOCARDIAL INFARCTION       -COPD   CLINICAL FOLLOW-UP RECOMMENDED.   TSH     Status: Abnormal   Collection Time: 03/05/15  1:10 AM  Result Value Ref Range   TSH 5.578 (H) 0.350 - 4.500 uIU/mL  Hemoglobin A1c     Status: Abnormal   Collection Time: 03/05/15  1:11 AM  Result Value Ref Range   Hgb A1c MFr Bld 6.1 (H) 4.8 - 5.6 %    Comment: (NOTE)         Pre-diabetes:  5.7 - 6.4          Diabetes: >6.4         Glycemic control for adults with diabetes: <7.0    Mean Plasma Glucose 128 mg/dL    Comment: (NOTE) Performed At: [facility] [place], [place] [id number] Lindon Romp MD [id number]   Lipid panel     Status: Abnormal   Collection Time: 03/05/15  1:11 AM  Result Value Ref Range   Cholesterol 195 0 - 200 mg/dL   Triglycerides 78 <150 mg/dL   HDL 45 >39 mg/dL   Total CHOL/HDL Ratio 4.3 RATIO   VLDL 16 0 - 40 mg/dL   LDL Cholesterol 134 (H) 0 - 99 mg/dL    Comment:        Total Cholesterol/HDL:CHD Risk Coronary Heart Disease Risk Table                     Men   Women  1/2 Average Risk   3.4   3.3  Average Risk       5.0   4.4  2 X Average Risk   9.6   7.1  3 X Average Risk  23.4   11.0        Use the calculated Patient Ratio above and the CHD Risk Table to determine the patient's CHD Risk.        ATP III CLASSIFICATION (LDL):  <100     mg/dL   Optimal  100-129  mg/dL   Near or Above                    Optimal  130-159  mg/dL   Borderline  160-189  mg/dL   High  >190     mg/dL   Very High   MRSA PCR Screening     Status: None   Collection Time: 03/05/15  3:16 AM  Result Value Ref Range   MRSA by PCR NEGATIVE NEGATIVE    Comment:        The GeneXpert MRSA Assay (FDA approved for NASAL specimens only), is one component of a comprehensive MRSA colonization surveillance program. It is not intended to diagnose MRSA infection nor to guide or monitor treatment for MRSA infections.   Heparin level (unfractionated)     Status: Abnormal   Collection Time: 03/05/15  4:10 AM  Result Value Ref Range   Heparin Unfractionated <0.10 (L) 0.30 - 0.70 IU/mL    Comment:        IF HEPARIN RESULTS ARE BELOW EXPECTED VALUES, AND PATIENT DOSAGE HAS BEEN CONFIRMED, SUGGEST FOLLOW UP TESTING OF ANTITHROMBIN III LEVELS.   CBC     Status: None   Collection Time: 03/05/15  4:10 AM  Result Value Ref Range   WBC 8.4 4.0 - 10.5 K/uL    RBC 4.70 4.22 - 5.81 MIL/uL   Hemoglobin 13.9 13.0 - 17.0 g/dL   HCT 42.4 39.0 - 52.0 %   MCV 90.2 78.0 - 100.0 fL   MCH 29.6 26.0 - 34.0 pg   MCHC 32.8 30.0 - 36.0 g/dL   RDW 13.9 11.5 - 15.5 %   Platelets 234 150 - 400 K/uL  Troponin I     Status: Abnormal   Collection Time: 03/05/15  7:40 AM  Result Value Ref Range   Troponin I 0.05 (H) <0.031 ng/mL    Comment:        PERSISTENTLY INCREASED TROPONIN VALUES IN THE RANGE  OF 0.04-0.49 ng/mL CAN BE SEEN IN:       -UNSTABLE ANGINA       -CONGESTIVE HEART FAILURE       -MYOCARDITIS       -CHEST TRAUMA       -ARRYHTHMIAS       -LATE PRESENTING MYOCARDIAL INFARCTION       -COPD   CLINICAL FOLLOW-UP RECOMMENDED.   Troponin I     Status: Abnormal   Collection Time: 03/05/15  1:15 PM  Result Value Ref Range   Troponin I 0.04 (H) <0.031 ng/mL    Comment:        PERSISTENTLY INCREASED TROPONIN VALUES IN THE RANGE OF 0.04-0.49 ng/mL CAN BE SEEN IN:       -UNSTABLE ANGINA       -CONGESTIVE HEART FAILURE       -MYOCARDITIS       -CHEST TRAUMA       -ARRYHTHMIAS       -LATE PRESENTING MYOCARDIAL INFARCTION       -COPD   CLINICAL FOLLOW-UP RECOMMENDED.   CBC     Status: None   Collection Time: 03/06/15  3:09 AM  Result Value Ref Range   WBC 9.3 4.0 - 10.5 K/uL   RBC 4.97 4.22 - 5.81 MIL/uL   Hemoglobin 14.5 13.0 - 17.0 g/dL   HCT 45.0 39.0 - 52.0 %   MCV 90.5 78.0 - 100.0 fL   MCH 29.2 26.0 - 34.0 pg   MCHC 32.2 30.0 - 36.0 g/dL   RDW 13.9 11.5 - 15.5 %   Platelets 265 150 - 400 K/uL  Basic metabolic panel     Status: None   Collection Time: 03/06/15  3:09 AM  Result Value Ref Range   Sodium 137 135 - 145 mmol/L   Potassium 4.0 3.5 - 5.1 mmol/L   Chloride 96 96 - 112 mmol/L   CO2 30 19 - 32 mmol/L   Glucose, Bld 96 70 - 99 mg/dL   BUN 6 6 - 23 mg/dL   Creatinine, Ser 0.93 0.50 - 1.35 mg/dL   Calcium 9.0 8.4 - 10.5 mg/dL   GFR calc non Af Amer >90 >90 mL/min   GFR calc Af Amer >90 >90 mL/min    Comment:  (NOTE) The eGFR has been calculated using the CKD EPI equation. This calculation has not been validated in all clinical situations. eGFR's persistently <90 mL/min signify possible Chronic Kidney Disease.    Anion gap 11 5 - 15    Imaging: Dg Chest 2 View  03/04/2015   CLINICAL DATA:  Chest pain and difficulty breathing since 4 p.m. today.  EXAM: CHEST  2 VIEW  COMPARISON:  None.  FINDINGS: The heart is borderline enlarged. There is tortuosity and mild ectasia of the thoracic aorta. There is peribronchial thickening and increased interstitial markings which could suggest chronic bronchitis, smoking changes, mild interstitial edema or interstitial pneumonitis. No pleural effusions. Streaky bibasilar atelectasis. The bony thorax is intact.  IMPRESSION: Borderline cardiac enlargement with acute versus chronic interstitial change as detailed above.   Electronically Signed   By: Marijo Sanes M.D.   On: 03/04/2015 20:50    Assessment:  Principal Problem:   Atrial flutter Active Problems:   Congestive heart failure   Morbid obesity   Non-restorative sleep   Elevated troponin   ETOH abuse   Acute heart failure   Plan:  1. Atrial flutter - CHADSVASC score is 2 (Systolic CHF, HTN). He is  rate-controlled on diltiazem - preliminary bedside echo shows EF of 15-20%, global hypokinesis. Continue Eliquis 5 mg BID. Plan for TEE/Cardioversion this afternoon. 2. Acute systolic congestive heart failure, NYHA Class IV symptoms- LVEF at bedside is around 15-20% with global hypokinesis. Diuresing well on IV lasix. Will switch to po b-blocker (carvediolol) after cardioversion. Renal function is normal. He is ACE-I naive.  Will also start Entresto 24-26 mg BID tonight. 3. Morbid obesity - will need weight loss - major risk factor for OSA, which is suspected 4. Non-restorative sleep - wife reports heavy snoring and witnessed apnea - will order overnight oximetry. Will need a formal outpatient sleep study.  Could be a major risk factor for his atrial flutter. Also, he has severe daytime fatigue and somnolence. 5. ETOH abuse - he realizes etoh is excessive and says that he is going to quit . No history of withdrawal or prior admissions for detox. 6. Elevated troponin - suspect due to CHF.  Ultimately, will need an ischemia evaluation - likely LHC. 7. Constipation - given MOM. Consider miralax PRN.  Time Spent Directly with Patient:  45 minutes  Length of Stay:  LOS: 1 day   [name], MD, [facility] Attending Cardiologist [facility]  HILTY,Kenneth C 03/06/2015, 11:15 AM

## 2015-03-06 NOTE — Interval H&P Note (Signed)
History and Physical Interval Note:  03/06/2015 1:22 PM  Arthur Church  has presented today for surgery, with the diagnosis of AFIB  The various methods of treatment have been discussed with the patient and family. After consideration of risks, benefits and other options for treatment, the patient has consented to  Procedure(s): TRANSESOPHAGEAL ECHOCARDIOGRAM (TEE) (N/A) CARDIOVERSION (N/A) as a surgical intervention .  The patient's history has been reviewed, patient examined, no change in status, stable for surgery.  I have reviewed the patient's chart and labs.  Questions were answered to the patient's satisfaction.     Brindy Higginbotham [employer]

## 2015-03-07 ENCOUNTER — Encounter (HOSPITAL_COMMUNITY): Payer: Self-pay | Admitting: Cardiology

## 2015-03-07 DIAGNOSIS — K5909 Other constipation: Secondary | ICD-10-CM

## 2015-03-07 LAB — BASIC METABOLIC PANEL
Anion gap: 10 (ref 5–15)
BUN: 8 mg/dL (ref 6–23)
CO2: 32 mmol/L (ref 19–32)
CREATININE: 0.79 mg/dL (ref 0.50–1.35)
Calcium: 9 mg/dL (ref 8.4–10.5)
Chloride: 96 mmol/L (ref 96–112)
GFR calc non Af Amer: >90 mL/min (ref >90)
Glucose, Bld: 116 mg/dL — ABNORMAL HIGH (ref 70–99)
Potassium: 4.1 mmol/L (ref 3.5–5.1)
Sodium: 138 mmol/L (ref 135–145)

## 2015-03-07 LAB — CBC
HCT: 46.1 % (ref 39.0–52.0)
Hemoglobin: 14.9 g/dL (ref 13.0–17.0)
MCH: 29.4 pg (ref 26.0–34.0)
MCHC: 32.3 g/dL (ref 30.0–36.0)
MCV: 91.1 fL (ref 78.0–100.0)
PLATELETS: 264 10*3/uL (ref 150–400)
RBC: 5.06 MIL/uL (ref 4.22–5.81)
RDW: 14 % (ref 11.5–15.5)
WBC: 10 10*3/uL (ref 4.0–10.5)

## 2015-03-07 MED ORDER — FUROSEMIDE 40 MG PO TABS: 40.0000 mg | ORAL_TABLET | Freq: Two times a day (BID) | ORAL | Status: DC

## 2015-03-07 MED ORDER — FUROSEMIDE 40 MG PO TABS: 40.0000 mg | ORAL_TABLET | Freq: Every day | ORAL | Status: DC

## 2015-03-07 MED ORDER — FUROSEMIDE 40 MG PO TABS
40.0000 mg | ORAL_TABLET | Freq: Two times a day (BID) | ORAL | Status: DC
  Administered 2015-03-07 – 2015-03-10 (×6): 40 mg via ORAL
  Filled 2015-03-07 (×8): qty 1

## 2015-03-07 NOTE — Progress Notes (Signed)
1301 tel report received from [place] 2h RN  1330 Received from 2h via wheelchair .Fully awake aler and oriented . Spouse at bedside . Orientation to pt and spouse given  Safety precauition  observed

## 2015-03-07 NOTE — Progress Notes (Signed)
Resting comfortably in bed ,dangling legs.spouse in attendance

## 2015-03-07 NOTE — Progress Notes (Signed)
DAILY PROGRESS NOTE  Subjective:  Successfully cardioverted yesterday. Has significant LV dysfunction. Switched to po lasix today. Weight down 5lbs since admit. Net negative almost 10L.  Breathing has improved. Started on carvedilol and Entresto yesterday for CHF. Labs are stable.   Objective:  Temp:  [97.6 F (36.4 C)-98.6 F (37 C)] 98.6 F (37 C) (03/24 0342) Pulse Rate:  [76-117] 76 (03/24 0800) Resp:  [12-22] 22 (03/24 0800) BP: (112-164)/(57-113) 131/98 mmHg (03/24 0800) SpO2:  [91 %-100 %] 92 % (03/24 0800) Weight:  [352 lb 1.2 oz (159.7 kg)] 352 lb 1.2 oz (159.7 kg) (03/24 0342) Weight change: -5 lb 15.2 oz (-2.7 kg)  Intake/Output from previous day: 03/23 0701 - 03/24 0700 In: 1287.5 [P.O.:730; I.V.:557.5] Out: 3000 [Urine:3000]  Intake/Output from this shift: Total I/O In: 360 [P.O.:360] Out: 350 [Urine:350]  Medications: Current Facility-Administered Medications  Medication Dose Route Frequency Provider Last Rate Last Dose  . 0.9 %  sodium chloride infusion  250 mL Intravenous PRN Lamar Sprinkles, MD 10 mL/hr at 03/05/15 1138 250 mL at 03/05/15 1138  . 0.9 %  sodium chloride infusion   Intravenous Continuous [name], MD 20 mL/hr at 03/06/15 1223 500 mL at 03/06/15 1223  . acetaminophen (TYLENOL) tablet 650 mg  650 mg Oral Q4H PRN Lamar Sprinkles, MD      . antiseptic oral rinse (CPC / CETYLPYRIDINIUM CHLORIDE 0.05%) solution 7 mL  7 mL Mouth Rinse BID Lamar Sprinkles, MD   7 mL at 03/07/15 1000  . apixaban (ELIQUIS) tablet 5 mg  5 mg Oral BID [name], MD   5 mg at 03/07/15 0859  . atorvastatin (LIPITOR) tablet 80 mg  80 mg Oral q1800 Lamar Sprinkles, MD   80 mg at 03/06/15 1808  . carvedilol (COREG) tablet 6.25 mg  6.25 mg Oral BID WC Larey Dresser, MD   6.25 mg at 03/07/15 0745  . furosemide (LASIX) tablet 40 mg  40 mg Oral BID [name], MD   40 mg at 03/07/15 0815  . magnesium hydroxide (MILK OF MAGNESIA) suspension 30 mL  30 mL Oral  Daily PRN Lamar Sprinkles, MD   30 mL at 03/06/15 2213  . ondansetron (ZOFRAN) injection 4 mg  4 mg Intravenous Q6H PRN Lamar Sprinkles, MD      . sacubitril-valsartan (ENTRESTO) 24-26 mg per tablet  1 tablet Oral BID [name], MD   1 tablet at 03/07/15 0859  . sodium chloride 0.9 % injection 3 mL  3 mL Intravenous Q12H Lamar Sprinkles, MD   3 mL at 03/07/15 0900  . sodium chloride 0.9 % injection 3 mL  3 mL Intravenous PRN Lamar Sprinkles, MD        Physical Exam: General appearance: alert, no distress and morbidly obese Neck: no carotid bruit, no JVD and neck thick Lungs: diminished breath sounds bilaterally Heart: irregularly irregular rhythm Abdomen: soft, non-tender; bowel sounds normal; no masses,  no organomegaly and morbidly obese Extremities: edema 1+ bilaterally Pulses: 2+ and symmetric Skin: pale, warm, dry Neurologic: Mental status: Alert, oriented, thought content appropriate, right eye exotropia Psych: Pleasant  Lab Results: Results for orders placed or performed during the hospital encounter of 03/04/15 (from the past 48 hour(s))  Troponin I     Status: Abnormal   Collection Time: 03/05/15  1:15 PM  Result Value Ref Range   Troponin I 0.04 (H) <0.031 ng/mL    Comment:  PERSISTENTLY INCREASED TROPONIN VALUES IN THE RANGE OF 0.04-0.49 ng/mL CAN BE SEEN IN:       -UNSTABLE ANGINA       -CONGESTIVE HEART FAILURE       -MYOCARDITIS       -CHEST TRAUMA       -ARRYHTHMIAS       -LATE PRESENTING MYOCARDIAL INFARCTION       -COPD   CLINICAL FOLLOW-UP RECOMMENDED.   CBC     Status: None   Collection Time: 03/06/15  3:09 AM  Result Value Ref Range   WBC 9.3 4.0 - 10.5 K/uL   RBC 4.97 4.22 - 5.81 MIL/uL   Hemoglobin 14.5 13.0 - 17.0 g/dL   HCT 45.0 39.0 - 52.0 %   MCV 90.5 78.0 - 100.0 fL   MCH 29.2 26.0 - 34.0 pg   MCHC 32.2 30.0 - 36.0 g/dL   RDW 13.9 11.5 - 15.5 %   Platelets 265 150 - 400 K/uL  Basic metabolic panel     Status: None   Collection  Time: 03/06/15  3:09 AM  Result Value Ref Range   Sodium 137 135 - 145 mmol/L   Potassium 4.0 3.5 - 5.1 mmol/L   Chloride 96 96 - 112 mmol/L   CO2 30 19 - 32 mmol/L   Glucose, Bld 96 70 - 99 mg/dL   BUN 6 6 - 23 mg/dL   Creatinine, Ser 0.93 0.50 - 1.35 mg/dL   Calcium 9.0 8.4 - 10.5 mg/dL   GFR calc non Af Amer >90 >90 mL/min   GFR calc Af Amer >90 >90 mL/min    Comment: (NOTE) The eGFR has been calculated using the CKD EPI equation. This calculation has not been validated in all clinical situations. eGFR's persistently <90 mL/min signify possible Chronic Kidney Disease.    Anion gap 11 5 - 15  CBC     Status: None   Collection Time: 03/07/15  4:33 AM  Result Value Ref Range   WBC 10.0 4.0 - 10.5 K/uL   RBC 5.06 4.22 - 5.81 MIL/uL   Hemoglobin 14.9 13.0 - 17.0 g/dL   HCT 46.1 39.0 - 52.0 %   MCV 91.1 78.0 - 100.0 fL   MCH 29.4 26.0 - 34.0 pg   MCHC 32.3 30.0 - 36.0 g/dL   RDW 14.0 11.5 - 15.5 %   Platelets 264 150 - 400 K/uL  Basic metabolic panel     Status: Abnormal   Collection Time: 03/07/15  4:33 AM  Result Value Ref Range   Sodium 138 135 - 145 mmol/L   Potassium 4.1 3.5 - 5.1 mmol/L   Chloride 96 96 - 112 mmol/L   CO2 32 19 - 32 mmol/L   Glucose, Bld 116 (H) 70 - 99 mg/dL   BUN 8 6 - 23 mg/dL   Creatinine, Ser 0.79 0.50 - 1.35 mg/dL   Calcium 9.0 8.4 - 10.5 mg/dL   GFR calc non Af Amer >90 >90 mL/min   GFR calc Af Amer >90 >90 mL/min    Comment: (NOTE) The eGFR has been calculated using the CKD EPI equation. This calculation has not been validated in all clinical situations. eGFR's persistently <90 mL/min signify possible Chronic Kidney Disease.    Anion gap 10 5 - 15    Imaging: No results found.  Assessment:  Principal Problem:   Atrial flutter Active Problems:   Morbid obesity   Non-restorative sleep   Elevated troponin   ETOH abuse  Acute systolic congestive heart failure, NYHA class 4   Plan:  1. Atrial flutter - CHADSVASC score is 2  (Systolic CHF, HTN). He is rate-controlled on diltiazem - preliminary bedside echo shows EF of 15-20%, global hypokinesis. Continue Eliquis 5 mg BID. Back in sinus and maintaining rhythm.  2. Acute systolic congestive heart failure, NYHA Class IV symptoms- LVEF at bedside is around 15-20% with global hypokinesis. Diuresing well on IV lasix. Will switch to po b-blocker (carvediolol) after cardioversion. Renal function is normal. He is ACE-I naive.  Will also start Entresto 24-26 mg BID tonight. Switched to po lasix - continue diuresis. He still has a 4L [place] o2 requirement. 3. Morbid obesity - will need weight loss - major risk factor for OSA, which is suspected 4. Non-restorative sleep - wife reports heavy snoring and witnessed apnea - will order overnight oximetry. Will need a formal outpatient sleep study. Could be a major risk factor for his atrial flutter. Also, he has severe daytime fatigue and somnolence. 5. ETOH abuse - he realizes etoh is excessive and says that he is going to quit . No history of withdrawal or prior admissions for detox. 6. Elevated troponin - suspect due to CHF.  Ultimately, will need an ischemia evaluation - likely LHC. 7. Constipation - good BM yesterday with miralax.  Ok to transfer to telemetry floor today (3E preferred).  Time Spent Directly with Patient:  30 minutes  Length of Stay:  LOS: 2 days   [name], MD, [place] [facility] Attending Cardiologist [facility]  HILTY,Kenneth C 03/07/2015, 11:08 AM

## 2015-03-07 NOTE — Progress Notes (Signed)
CARDIAC REHAB PHASE I   PRE:  Rate/Rhythm: 80 SR with PACs    BP: sitting 141/87    SaO2: 93 3L  MODE:  Ambulation: 400 ft   POST:  Rate/Rhythm: 88 SR with PACs    BP: sitting 134/87     SaO2: 93 3L  Pt tolerated well. Thankful to walk, sts it feels good. Denied SOB. 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 Numerous PACs. Gave HF booklet and video to watch. [id number]   Elissa Lovett [place] CES, ACSM 03/07/2015 3:40 PM

## 2015-03-07 NOTE — Discharge Instructions (Signed)
Information on my medicine - ELIQUIS® (apixaban) ° °This medication education was reviewed with me or my healthcare representative as part of my discharge preparation.  The pharmacist that spoke with me during my hospital stay was:  Ashleynicole Mcclees Rhea, RPH ° °Why was Eliquis® prescribed for you? °Eliquis® was prescribed for you to reduce the risk of a blood clot forming that can cause a stroke if you have a medical condition called atrial fibrillation (a type of irregular heartbeat). ° °What do You need to know about Eliquis® ? °Take your Eliquis® TWICE DAILY - one tablet in the morning and one tablet in the evening with or without food. If you have difficulty swallowing the tablet whole please discuss with your pharmacist how to take the medication safely. ° °Take Eliquis® exactly as prescribed by your doctor and DO NOT stop taking Eliquis® without talking to the doctor who prescribed the medication.  Stopping may increase your risk of developing a stroke.  Refill your prescription before you run out. ° °After discharge, you should have regular check-up appointments with your healthcare provider that is prescribing your Eliquis®.  In the future your dose may need to be changed if your kidney function or weight changes by a significant amount or as you get older. ° °What do you do if you miss a dose? °If you miss a dose, take it as soon as you remember on the same day and resume taking twice daily.  Do not take more than one dose of ELIQUIS at the same time to make up a missed dose. ° °Important Safety Information °A possible side effect of Eliquis® is bleeding. You should call your healthcare provider right away if you experience any of the following: °  Bleeding from an injury or your nose that does not stop. °  Unusual colored urine (red or dark brown) or unusual colored stools (red or black). °  Unusual bruising for unknown reasons. °  A serious fall or if you hit your head (even if there is no  bleeding). ° °Some medicines may interact with Eliquis® and might increase your risk of bleeding or clotting while on Eliquis®. To help avoid this, consult your healthcare provider or pharmacist prior to using any new prescription or non-prescription medications, including herbals, vitamins, non-steroidal anti-inflammatory drugs (NSAIDs) and supplements. ° °This website has more information on Eliquis® (apixaban): http://www.eliquis.com/eliquis/home ° °

## 2015-03-08 ENCOUNTER — Other Ambulatory Visit: Payer: Self-pay | Admitting: Physician Assistant

## 2015-03-08 DIAGNOSIS — F1010 Alcohol abuse, uncomplicated: Secondary | ICD-10-CM

## 2015-03-08 DIAGNOSIS — I5021 Acute systolic (congestive) heart failure: Secondary | ICD-10-CM

## 2015-03-08 LAB — BASIC METABOLIC PANEL
ANION GAP: 8 (ref 5–15)
BUN: 12 mg/dL (ref 6–23)
CHLORIDE: 96 mmol/L (ref 96–112)
CO2: 34 mmol/L — AB (ref 19–32)
CREATININE: 0.82 mg/dL (ref 0.50–1.35)
Calcium: 9.1 mg/dL (ref 8.4–10.5)
GFR calc non Af Amer: >90 mL/min (ref >90)
Glucose, Bld: 121 mg/dL — ABNORMAL HIGH (ref 70–99)
POTASSIUM: 4 mmol/L (ref 3.5–5.1)
Sodium: 138 mmol/L (ref 135–145)

## 2015-03-08 MED ORDER — CARVEDILOL 12.5 MG PO TABS
12.5000 mg | ORAL_TABLET | Freq: Two times a day (BID) | ORAL | Status: DC
  Administered 2015-03-08 – 2015-03-10 (×4): 12.5 mg via ORAL
  Filled 2015-03-08 (×6): qty 1

## 2015-03-08 NOTE — Progress Notes (Signed)
CARDIAC REHAB PHASE I   PRE:  Rate/Rhythm: 90 SR with PAC's  BP:  Supine:   Sitting: 126/82  Standing:    SaO2: 93 2 1/2 93 RA  MODE:  Ambulation: 1600 ft   POST:  Rate/Rhythm: 109 ST with PAC's  BP:  Supine:   Sitting: 149/94  Standing:    SaO2: 89-93 RA 1410-1510 On arrival pt on O2 2 1/2 L sat 93%. O2 discontinued room air sat 93-94%. Walked pt on room air sat 89-93%. Pt denies SOB. VS stable. RA sat when back to room 93%. O2 left off. Pt denies SOB walking. He describes a pattern of sleep apnea. Pt needs O2 saturations to be measured at night during sleep. Completed CHF and Afib education with pt and wife. They voice understanding. I encouraged them to watch Afib and CHF videos. Pt and his wife seems very motivated to making changes, expect good compliance.  Melina Copa RN 03/08/2015 3:26 PM

## 2015-03-08 NOTE — Progress Notes (Addendum)
Patient Name: Arthur Church Date of Encounter: 03/08/2015  Primary Cardiologist: New - Dr. Rennis Golden   Principal Problem:   Atrial flutter Active Problems:   Morbid obesity   Non-restorative sleep   Elevated troponin   ETOH abuse   Acute systolic congestive heart failure, NYHA class 4    SUBJECTIVE  Denies any SOB or CP  CURRENT MEDS . antiseptic oral rinse  7 mL Mouth Rinse BID  . apixaban  5 mg Oral BID  . atorvastatin  80 mg Oral q1800  . carvedilol  6.25 mg Oral BID WC  . furosemide  40 mg Oral BID  . sacubitril-valsartan  1 tablet Oral BID  . sodium chloride  3 mL Intravenous Q12H    OBJECTIVE  Filed Vitals:   03/07/15 1410 03/07/15 2049 03/08/15 0224 03/08/15 0500  BP: 128/95 148/98 119/70 131/69  Pulse: 99 82 80 88  Temp: 98.5 F (36.9 C) 97.7 F (36.5 C) 98 F (36.7 C) 98.1 F (36.7 C)  TempSrc:  Oral Oral Oral  Resp:  20 18 20   Height:      Weight:    351 lb 6.4 oz (159.394 kg)  SpO2: 95% 96% 95% 98%    Intake/Output Summary (Last 24 hours) at 03/08/15 0821 Last data filed at 03/08/15 0400  Gross per 24 hour  Intake   1060 ml  Output   2475 ml  Net  -1415 ml   Filed Weights   03/06/15 0300 03/07/15 0342 03/08/15 0500  Weight: 358 lb 0.4 oz (162.4 kg) 352 lb 1.2 oz (159.7 kg) 351 lb 6.4 oz (159.394 kg)    PHYSICAL EXAM  General: Pleasant, NAD. Neuro: Alert and oriented X 3. Moves all extremities spontaneously. Psych: Normal affect. HEENT:  Normal  Neck: Supple without bruits or JVD. Lungs:  Resp regular and unlabored, CTA. Heart: RRR no s3, s4, or murmurs. Abdomen: Soft, non-tender, non-distended, BS + x 4.  Extremities: No clubbing, cyanosis or edema. DP/PT/Radials 2+ and equal bilaterally.  Accessory Clinical Findings  CBC  Recent Labs  03/06/15 0309 03/07/15 0433  WBC 9.3 10.0  HGB 14.5 14.9  HCT 45.0 46.1  MCV 90.5 91.1  PLT 265 264   Basic Metabolic Panel  Recent Labs  03/07/15 0433 03/08/15 0635  NA 138 138  K  4.1 4.0  CL 96 96  CO2 32 34*  GLUCOSE 116* 121*  BUN 8 12  CREATININE 0.79 0.82  CALCIUM 9.0 9.1   Cardiac Enzymes  Recent Labs  03/05/15 1315  TROPONINI 0.04*    TELE NSR with HR 80s, frequent PACs    ECG  No new EKG  Echocardiogram 03/06/2015  - Left ventricle: The cavity size was moderately dilated. Systolic function was severely reduced. The estimated ejection fraction was in the range of 20% to 25%. Diffuse hypokinesis. - Aorta: Ascending aortic diameter: 40 mm (S). - Left atrium: The atrium was mildly dilated. - Right ventricle: The cavity size was mildly dilated. Wall thickness was normal. Systolic function was moderately reduced.     Radiology/Studies  Dg Chest 2 View  03/04/2015   CLINICAL DATA:  Chest pain and difficulty breathing since 4 p.m. today.  EXAM: CHEST  2 VIEW  COMPARISON:  None.  FINDINGS: The heart is borderline enlarged. There is tortuosity and mild ectasia of the thoracic aorta. There is peribronchial thickening and increased interstitial markings which could suggest chronic bronchitis, smoking changes, mild interstitial edema or interstitial pneumonitis. No pleural effusions. Streaky  bibasilar atelectasis. The bony thorax is intact.  IMPRESSION: Borderline cardiac enlargement with acute versus chronic interstitial change as detailed above.   Electronically Signed   By: Rudie Meyer M.D.   On: 03/04/2015 20:50    ASSESSMENT AND PLAN 73 obese male former smoker with HTN who presents with dyspnea, weight gain, LE edema, and chest pain. He has new onset congestive heart failure and atrial flutter. His chest pain episodes are concerning for ACS. TnI is 0.05 -- it could all be related to HF and arrhythmia.   1. Newly diagnosed atrial flutter with RVR  - CHA2DS2- Vasc score 2 (CHF, HTN), started on eliquis  - s/p TEE/DCCV on 03/06/2015 EF 15-20%, mild MR  - maintaining NSR after cardioversion however has frequent PACs on telemetry, will  increase coreg.  - possible discharge today vs tomorrow, however worried about compliance and cost for entresto and eliquis, will discuss with case manager  2. Acute systolic heart failure in the setting of prolonged a-fib with RVR  - likely tachy-mediated, also had CP prior to arrival  - started on coreg and Entresto  - EF 15-20% on TEE, repeat TTE 03/06/2015 EF 20-25%, diffuse hypokinesis.  - will need repeat echo in 3 month, if LVEF < 35%, may need ICD   - will need stress test vs outpatient LHC esp if EF does not improve in 3 month however this can be done as outpatient given the patient is already back on eliquis. Continue  BID PO lasix. Need 1 wk BMET  3. Morbid obesity 4. Elevated troponin: likely related to CHF 5. ETOH abuse 6. Possible OSA need outpatient sleep study 7. Pre-diabetes: A1C 6.1, followup with PCP. Will need to establish care with new PCP 8. Elevated TSH: TSH 5.578, need outpatient assessment.   Ramond Dial PA Pager: [phone]   I have seen and examined the patient along with Azalee Course, PA.  I have reviewed the chart, notes and new data.  I agree with PA's note.  Key new complaints: still wearing O2, dyspnea improved substantially Key examination changes: no rales, regular rhythm, virtually no edema Key new findings / data: mildly elevated TSH could represent sick euthyroid sd.  PLAN: Differential includes tachycardia related or alcoholic cardiomyopathy, but ischemic heart disease is statistically most likely and will need to be excluded first. Possible DC tomorrow. Cost of meds may be an issue in the long run.  Discussed mandatory need for Eliquis x 30 days. Will need coronary angiography once it is safe to interrupt anticoagulation. Will also need frequent follow up for titration of CHF meds and diuretics as an outpatient. Reevaluate need for ICD in 90 days, depending on need for revascularization. Complete ETOH abstinence. Daily weights and sodium  restriction discussed. Thurmon Fair, MD, [facility] [phone] 03/08/2015, 10:28 AM

## 2015-03-09 ENCOUNTER — Other Ambulatory Visit: Payer: Self-pay | Admitting: Physician Assistant

## 2015-03-09 DIAGNOSIS — I484 Atypical atrial flutter: Secondary | ICD-10-CM | POA: Diagnosis present

## 2015-03-09 DIAGNOSIS — I5022 Chronic systolic (congestive) heart failure: Secondary | ICD-10-CM

## 2015-03-09 DIAGNOSIS — E875 Hyperkalemia: Secondary | ICD-10-CM | POA: Diagnosis present

## 2015-03-09 DIAGNOSIS — R197 Diarrhea, unspecified: Secondary | ICD-10-CM | POA: Diagnosis present

## 2015-03-09 LAB — BASIC METABOLIC PANEL
ANION GAP: 8 (ref 5–15)
Anion gap: 9 (ref 5–15)
BUN: 18 mg/dL (ref 6–23)
BUN: 17 mg/dL (ref 6–23)
CALCIUM: 9.3 mg/dL (ref 8.4–10.5)
CO2: 34 mmol/L — AB (ref 19–32)
CO2: 28 mmol/L (ref 19–32)
CREATININE: 0.91 mg/dL (ref 0.50–1.35)
Calcium: 9.4 mg/dL (ref 8.4–10.5)
Chloride: 96 mmol/L (ref 96–112)
Chloride: 99 mmol/L (ref 96–112)
Creatinine, Ser: 0.88 mg/dL (ref 0.50–1.35)
GFR calc Af Amer: >90 mL/min (ref >90)
GFR calc Af Amer: >90 mL/min (ref >90)
GLUCOSE: 103 mg/dL — AB (ref 70–99)
Glucose, Bld: 117 mg/dL — ABNORMAL HIGH (ref 70–99)
Potassium: 5.2 mmol/L — ABNORMAL HIGH (ref 3.5–5.1)
Potassium: 4.6 mmol/L (ref 3.5–5.1)
SODIUM: 136 mmol/L (ref 135–145)
Sodium: 138 mmol/L (ref 135–145)

## 2015-03-09 LAB — T4, FREE: Free T4: 1.09 ng/dL (ref 0.80–1.80)

## 2015-03-09 MED ORDER — DIPHENHYDRAMINE HCL 25 MG PO CAPS: 25.0000 mg | ORAL_CAPSULE | ORAL | Status: DC | PRN

## 2015-03-09 NOTE — Progress Notes (Signed)
Patient Name: Arthur Church Date of Encounter: 03/09/2015  Primary Cardiologist: New - Dr. Rennis Golden   Principal Problem:   Atrial flutter Active Problems:   Morbid obesity   Non-restorative sleep   Elevated troponin   ETOH abuse   Acute systolic congestive heart failure, NYHA class 4    SUBJECTIVE  Denies any SOB or CP  CURRENT MEDS . antiseptic oral rinse  7 mL Mouth Rinse BID  . apixaban  5 mg Oral BID  . atorvastatin  80 mg Oral q1800  . carvedilol  12.5 mg Oral BID WC  . furosemide  40 mg Oral BID  . sacubitril-valsartan  1 tablet Oral BID  . sodium chloride  3 mL Intravenous Q12H    OBJECTIVE  Filed Vitals:   03/08/15 1700 03/08/15 2234 03/09/15 0216 03/09/15 0534  BP: 122/68 124/58 128/64 126/69  Pulse: 69 70 72 76  Temp: 98 F (36.7 C) 98.5 F (36.9 C) 98.3 F (36.8 C) 98.2 F (36.8 C)  TempSrc: Oral Oral Oral Oral  Resp: Height:      Weight:    350 lb 12.7 oz (159.118 kg)  SpO2: 90% 98% 96% 98%    Intake/Output Summary (Last 24 hours) at 03/09/15 0927 Last data filed at 03/09/15 0529  Gross per 24 hour  Intake   1080 ml  Output   1575 ml  Net   -495 ml   Filed Weights   03/07/15 0342 03/08/15 0500 03/09/15 0534  Weight: 352 lb 1.2 oz (159.7 kg) 351 lb 6.4 oz (159.394 kg) 350 lb 12.7 oz (159.118 kg)    PHYSICAL EXAM  General: Pleasant, NAD. obese Neuro: Alert and oriented X 3. Moves all extremities spontaneously. Psych: Normal affect. HEENT:  Normal  Neck: Supple without bruits or JVD. Lungs:  Resp regular and unlabored, CTA. Heart: RRR no s3, s4, or murmurs. Abdomen: Soft, non-tender, non-distended, BS + x 4.  Extremities: No clubbing, cyanosis or no edema. DP/PT/Radials 2+ and equal bilaterally.  Accessory Clinical Findings  CBC  Recent Labs  03/07/15 0433  WBC 10.0  HGB 14.9  HCT 46.1  MCV 91.1  PLT 264   Basic Metabolic Panel  Recent Labs  03/08/15 0635 03/09/15 0340  NA 138 138  K 4.0 5.2*  CL 96  96  CO2 34* 34*  GLUCOSE 121* 117*  BUN 12 18  CREATININE 0.82 0.91  CALCIUM 9.1 9.4   Cardiac Enzymes No results for input(s): CKTOTAL, CKMB, CKMBINDEX, TROPONINI in the last 72 hours.  TELE NSR with HR 80s, frequent PACs    ECG  No new EKG  Echocardiogram 03/06/2015  - Left ventricle: The cavity size was moderately dilated. Systolic function was severely reduced. The estimated ejection fraction was in the range of 20% to 25%. Diffuse hypokinesis. - Aorta: Ascending aortic diameter: 40 mm (S). - Left atrium: The atrium was mildly dilated. - Right ventricle: The cavity size was mildly dilated. Wall thickness was normal. Systolic function was moderately reduced.     Radiology/Studies  Dg Chest 2 View  03/04/2015   CLINICAL DATA:  Chest pain and difficulty breathing since 4 p.m. today.  EXAM: CHEST  2 VIEW  COMPARISON:  None.  FINDINGS: The heart is borderline enlarged. There is tortuosity and mild ectasia of the thoracic aorta. There is peribronchial thickening and increased interstitial markings which could suggest chronic bronchitis, smoking changes, mild interstitial edema or interstitial pneumonitis. No pleural effusions. Streaky bibasilar  atelectasis. The bony thorax is intact.  IMPRESSION: Borderline cardiac enlargement with acute versus chronic interstitial change as detailed above.   Electronically Signed   By: Rudie Meyer M.D.   On: 03/04/2015 20:50    ASSESSMENT AND PLAN 50 obese male former smoker with HTN who presented to [facility] on 03/05/15 with dyspnea, weight gain, LE edema, and chest pain. He was found to have new onset CHF and atrial flutter. His chest pain episodes are concerning for ACS. TnI is 0.05 -- it could all be related to HF and arrhythmia.   Newly diagnosed atrial flutter with RVR  - CHA2DS2- Vasc score 2 (CHF, HTN), started on eliquis  - s/p TEE/DCCV on 03/06/2015 EF 15-20%, mild MR. Will need to be on uninterrupted AC for at least 1 month  -  maintaining NSR after cardioversion however has frequent PACs on telemetry, coreg dose increased to 12.5mg  BID.   Acute systolic heart failure in the setting of prolonged a-fib with RVR  - likely tachy-mediated, also had CP prior to arrival. Differential includes tachycardia related or alcoholic cardiomyopathy, but ischemic heart disease is statistically most likely and will need to be excluded first. Will need coronary angiography once it is safe to interrupt anticoagulation. He also has many risk factors for CAD  - started on coreg and Entresto  - EF 15-20% on TEE, repeat TTE 03/06/2015 EF 20-25%, diffuse hypokinesis. EF 15-20% by TEE.  - Will need repeat echo in 3 month, if LVEF < 35%, may need ICD. Reevaluate need for ICD in 90 days, depending on need for revascularization. He was noted to have 14 beat run of NSVT. He is at high risk for SCD so we will place a LifeVest before discharge.   - Continue 40mg  BID PO lasix. Need 1 wk BMET  - Complete ETOH abstinence.  - Daily weights and sodium restriction discussed.  Hyperkalemia- K jumped from 4.0 to 5.1 today. Will hold Entresto today.  NSVT- 14 beat run noted on tele. Worrisome for ischemia. High risk for SCD with low EF, will place LifeVest.  -- Continue BB  Rash- noted on face and chest. Pruritic. Will order benadryl. On many new drugs. Continue to monitor  Diarrhea- he has two days of watery mucus-y diarrhea. Will order a CDif PCR  Morbid obesity- Body mass index is 46.29 kg/(m^2).  Elevated troponin: likely related to CHF, but has many RFs for CAD. He will need a LHC at some point. We can hopefully arrange this as an outpatient after he has completed 30 days of Eliquis after DCCV on 03/06/15.  ETOH abuse- advised complete abstinence.   Possible OSA- need outpatient sleep study  Pre-diabetes: A1C 6.1, followup with PCP.  -- Will need to establish care with new PCP  Elevated TSH: TSH 5.578. mildly elevated TSH could represent sick  euthyroid sd. -- Follow up as an out patient   Cline Crock PA-C  [facility]

## 2015-03-09 NOTE — Progress Notes (Signed)
The patient was placed on a continuous pulse ox overnight.  His O2 sat fell into the mid-80s and he was placed on 3L of O2.

## 2015-03-09 NOTE — Progress Notes (Signed)
CARDIAC REHAB PHASE I   PRE:  Rate/Rhythm: SR 82  BP:  Supine:   Sitting: 110/64 left radial  Standing:    SaO2: 91 on ra increased to 95 with PLB 30 seconds  MODE:  Ambulation: 400 ft   POST:  Rate/Rhythm: 89  BP:  Supine:   Sitting: 141/71 left radial  Standing:    SaO2: 93 RA increased to 95-96% rest and plb 15 seconds  Pt ambulated x 1 minimal assist in hallway with rehab staff. Pt gait steady and strong.  Pt worried about the loose stools and the possibility of C. Difficile.  Reassured pt and offered support.  Pt to side of bed, call bell in place. Denies any other needs. Alanson Aly, BSN [phone]

## 2015-03-10 ENCOUNTER — Encounter (HOSPITAL_COMMUNITY): Payer: Self-pay | Admitting: Physician Assistant

## 2015-03-10 DIAGNOSIS — R7989 Other specified abnormal findings of blood chemistry: Secondary | ICD-10-CM | POA: Diagnosis present

## 2015-03-10 DIAGNOSIS — R946 Abnormal results of thyroid function studies: Secondary | ICD-10-CM

## 2015-03-10 DIAGNOSIS — I484 Atypical atrial flutter: Principal | ICD-10-CM

## 2015-03-10 DIAGNOSIS — R7303 Prediabetes: Secondary | ICD-10-CM | POA: Diagnosis present

## 2015-03-10 LAB — BASIC METABOLIC PANEL
ANION GAP: 9 (ref 5–15)
BUN: 14 mg/dL (ref 6–23)
CO2: 32 mmol/L (ref 19–32)
Calcium: 9.3 mg/dL (ref 8.4–10.5)
Chloride: 97 mmol/L (ref 96–112)
Creatinine, Ser: 0.81 mg/dL (ref 0.50–1.35)
GFR calc Af Amer: >90 mL/min (ref >90)
Glucose, Bld: 179 mg/dL — ABNORMAL HIGH (ref 70–99)
POTASSIUM: 4.4 mmol/L (ref 3.5–5.1)
SODIUM: 138 mmol/L (ref 135–145)

## 2015-03-10 MED ORDER — ATORVASTATIN CALCIUM 80 MG PO TABS: 80.0000 mg | ORAL_TABLET | Freq: Every day | ORAL | Status: DC

## 2015-03-10 MED ORDER — APIXABAN 5 MG PO TABS: 5.0000 mg | ORAL_TABLET | Freq: Two times a day (BID) | ORAL | Status: DC

## 2015-03-10 MED ORDER — FUROSEMIDE 40 MG PO TABS: 40.0000 mg | ORAL_TABLET | Freq: Two times a day (BID) | ORAL | Status: DC

## 2015-03-10 MED ORDER — CARVEDILOL 12.5 MG PO TABS: 12.5000 mg | ORAL_TABLET | Freq: Two times a day (BID) | ORAL | Status: DC

## 2015-03-10 MED ORDER — LISINOPRIL 10 MG PO TABS: 5.0000 mg | ORAL_TABLET | Freq: Every day | ORAL | Status: DC

## 2015-03-10 NOTE — Discharge Summary (Signed)
Discharge Summary   Patient ID: Arthur Church MRN: [id number], DOB/AGE: July 18, 1956 59 y.o. Admit date: 03/04/2015 D/C date:     03/10/2015  Primary Cardiologist: Dr. Rennis Golden  Principal Problems:   Atrial flutter- new dx   Acute systolic congestive heart failure- new dx Active Problems:   Morbid obesity   Elevated troponin   ETOH abuse   Atypical atrial flutter   Diarrhea   Hyperkalemia   Elevated TSH   Pre-diabetes    Admission Dates: 03/04/15-03/10/15 Discharge Diagnosis: New onset acute systolic CHF and new onset atrial flutter s/p TEE/DCCV   HPI: Arthur Church is a 59 y.o. male with a history of tobacco abuse, heavy alcohol use and HTN who presented to [facility] on 03/05/15 with dyspnea, weight gain, LE edema, and chest pain. He was found to have new onset CHF and atrial flutter.    Hospital Course  Newly diagnosed atrial flutter with RVR  - CHA2DS2- Vasc score 2 (CHF, HTN), started on eliquis  - S/p TEE/DCCV on 03/06/2015 EF 15-20%, mild MR. Will need to be on uninterrupted AC for at least 1 month  - Maintaining NSR after cardioversion however has frequent PACs on telemetry, coreg dose increased to 12.5mg  BID.   Newly diagnosed acute systolic heart failure in the setting of prolonged a-fib with RVR  - Likely tachy-mediated, also had CP prior to arrival. Differential includes tachycardia related or alcoholic cardiomyopathy, but ischemic heart disease is statistically most likely and will need to be excluded first. Will need coronary angiography once it is safe to interrupt anticoagulation. He also has many risk factors for CAD  - Sarted on coreg and Entresto; however entresto D/C'd due to hyperkalemia. Will start low dose of lisinopril 5mg  as K has normalized. BMET in 1 week scheduled.   - EF 15-20% on TEE, repeat TTE 03/06/2015 EF 20-25%, diffuse hypokinesis. EF 15-20% by TEE.  - Will need repeat echo in 3 month, if LVEF < 35%, may need ICD. Reevaluate need for ICD in 90 days,  depending on need for revascularization. He was noted to have 14 beat run of NSVT. He is at high risk for SCD so a LifeVest has been placed.  - Continue 40mg  BID PO lasix. Need 1 wk BMET  - Complete ETOH abstinence.  - Daily weights and sodium restriction discussed.  Hyperkalemia- K jumped from 4.0 to 5.1 yesterday. Entresto held. Repeat BMET last night and K down to 4.4  NSVT- 14 beat run noted on tele. Worrisome for ischemia. High risk for SCD with low EF,  LifeVest placed.   -- Continue BB  Rash- noted on face and chest. He has had similar rash before and thinks it has to do with his abrupt alcohol cessation since being in the hopsital. Will continue to monitor  Diarrhea- now resolved. C Dif PCR pending. Low risk as he has not been on ABx recently, no white count or fever.   Morbid obesity- Body mass index is 45.96 kg/(m^2).  Elevated troponin: likely related to CHF, but has many RFs for CAD. He will need a LHC at some point. We can hopefully arrange this as an outpatient after he has completed 30 days of Eliquis after DCCV on 03/06/15. -- Placed on statin   ETOH abuse- advised complete abstinence. He is determined to quit  Possible OSA- need outpatient sleep study  Pre-diabetes: A1C 6.1, followup with PCP.  -- Will need to establish care with new PCP  Elevated TSH: TSH  5.578. mildly elevated TSH could represent sick euthyroid sd. -- Follow up as an out patient  HLD- LDL 134. Placed on statin.   The patient has had an uncomplicated hospital course and is recovering well. He has been seen by Dr. Jeffie Pollock today and deemed ready for discharge home. All follow-up appointments have been scheduled. Smoking cessation was disscussed in length. Discharge medications are listed below. He will need a TOC appt in 1-2 weeks. I have sent a staff message to arrange this.    Discharge Vitals: Blood pressure 121/100, pulse 63, temperature 98.2 F (36.8 C), temperature source Oral, resp. rate 18,  height  (1.854 m), weight 348 lb 4.8 oz (157.988 kg), SpO2 96 %.  Labs: Lab Results  Component Value Date   WBC 10.0 03/07/2015   HGB 14.9 03/07/2015   HCT 46.1 03/07/2015   MCV 91.1 03/07/2015   PLT 264 03/07/2015     Recent Labs Lab 03/10/15 0859  NA 138  K 4.4  CL 97  CO2 32  Arthur 14  CREATININE 0.81  CALCIUM 9.3  GLUCOSE 179*   No results for input(s): CKTOTAL, CKMB, TROPONINI in the last 72 hours. Lab Results  Component Value Date   CHOL 195 03/05/2015   HDL 45 03/05/2015   LDLCALC 134* 03/05/2015   TRIG 78 03/05/2015     Diagnostic Studies/Procedures   Dg Chest 2 View  03/04/2015   CLINICAL DATA:  Chest pain and difficulty breathing since 4 p.m. today.  EXAM: CHEST  2 VIEW  COMPARISON:  None.  FINDINGS: The heart is borderline enlarged. There is tortuosity and mild ectasia of the thoracic aorta. There is peribronchial thickening and increased interstitial markings which could suggest chronic bronchitis, smoking changes, mild interstitial edema or interstitial pneumonitis. No pleural effusions. Streaky bibasilar atelectasis. The bony thorax is intact.  IMPRESSION: Borderline cardiac enlargement with acute versus chronic interstitial change as detailed above.   Electronically Signed   By: Rudie Meyer M.D.   On: 03/04/2015 20:50    Echocardiogram 03/06/2015  - Left ventricle: The cavity size was moderately dilated. Systolic function was severely reduced. The estimated ejection fraction was in the range of 20% to 25%. Diffuse hypokinesis. - Aorta: Ascending aortic diameter: 40 mm (S). - Left atrium: The atrium was mildly dilated. - Right ventricle: The cavity size was mildly dilated. Wall thickness was normal. Systolic function was moderately reduced.     Discharge Medications     Medication List    STOP taking these medications        ibuprofen 200 MG tablet  Commonly known as:  ADVIL,MOTRIN      TAKE these medications        apixaban  5 MG Tabs tablet  Commonly known as:  ELIQUIS  Take 1 tablet (5 mg total) by mouth 2 (two) times daily.     atorvastatin 80 MG tablet  Commonly known as:  LIPITOR  Take 1 tablet (80 mg total) by mouth daily at 6 PM.     carvedilol 12.5 MG tablet  Commonly known as:  COREG  Take 1 tablet (12.5 mg total) by mouth 2 (two) times daily with a meal.     diphenhydrAMINE 25 MG tablet  Commonly known as:  BENADRYL  Take 25 mg by mouth every 6 (six) hours as needed for allergies.     furosemide 40 MG tablet  Commonly known as:  LASIX  Take 1 tablet (40 mg total) by mouth 2 (two)  times daily.     lisinopril 10 MG tablet  Commonly known as:  PRINIVIL  Take 0.5 tablets (5 mg total) by mouth daily.     loratadine 10 MG tablet  Commonly known as:  CLARITIN  Take 10 mg by mouth daily.     sodium chloride 0.65 % Soln nasal spray  Commonly known as:  OCEAN  Place 1 spray into both nostrils as needed for congestion.        Disposition   The patient will be discharged in stable condition to home.  Follow-up Information    Follow up with [facility] R, NP On 04/05/2015.   Specialty:  Cardiology   Why:  8:00am   Contact information:   [address] [place] [address] [phone]       Follow up with [facility] On 03/15/2015.   Why:  Obtain BMET lab to check renal function in 1 week   Contact information:   [address] [place] [address] [phone]      Follow up with [facility].   Specialty:  Cardiology   Why:  The office will call you to make an appoinment., If you do not hear from them, please contact them., You should be seen within 1-2 weeks.   Contact information:   [address] [place] [address] [phone]        Duration of Discharge Encounter: Greater than 30 minutes including physician and PA time.  Shari Heritage, Tamila Gaulin R PA-C 03/10/2015, 1:00 PM

## 2015-03-10 NOTE — Progress Notes (Signed)
Patient Name: Arthur Church Date of Encounter: 03/10/2015  Primary Cardiologist: New - Dr. Rennis Golden   Principal Problem:   Atrial flutter Active Problems:   Morbid obesity   Non-restorative sleep   Elevated troponin   ETOH abuse   Acute systolic congestive heart failure, NYHA class 4   Atypical atrial flutter   Diarrhea   Hyperkalemia    SUBJECTIVE  Denies any SOB or CP. Feeling well. Ready to go home.   CURRENT MEDS . antiseptic oral rinse  7 mL Mouth Rinse BID  . apixaban  5 mg Oral BID  . atorvastatin  80 mg Oral q1800  . carvedilol  12.5 mg Oral BID WC  . furosemide  40 mg Oral BID  . sodium chloride  3 mL Intravenous Q12H    OBJECTIVE  Filed Vitals:   03/09/15 1501 03/09/15 2118 03/10/15 0530 03/10/15 0655  BP: 139/86 138/90 120/69   Pulse: 80 65 63   Temp: 98.2 F (36.8 C) 97.7 F (36.5 C) 98.2 F (36.8 C)   TempSrc: Oral Oral Oral   Resp: 18 16 18    Height:      Weight:    348 lb 4.8 oz (157.988 kg)  SpO2: 94% 97% 96%     Intake/Output Summary (Last 24 hours) at 03/10/15 0752 Last data filed at 03/10/15 0658  Gross per 24 hour  Intake   1420 ml  Output   2050 ml  Net   -630 ml   Filed Weights   03/08/15 0500 03/09/15 0534 03/10/15 0655  Weight: 351 lb 6.4 oz (159.394 kg) 350 lb 12.7 oz (159.118 kg) 348 lb 4.8 oz (157.988 kg)    PHYSICAL EXAM  General: Pleasant, NAD. obese Neuro: Alert and oriented X 3. Moves all extremities spontaneously. Psych: Normal affect. HEENT:  Normal  Neck: Supple without bruits or JVD. Lungs:  Resp regular and unlabored, CTA. Heart: RRR no s3, s4, or murmurs. Abdomen: Soft, non-tender, non-distended, BS + x 4.  Extremities: No clubbing, cyanosis or no edema. DP/PT/Radials 2+ and equal bilaterally.  Accessory Clinical Findings  CBC No results for input(s): WBC, NEUTROABS, HGB, HCT, MCV, PLT in the last 72 hours. Basic Metabolic Panel  Recent Labs  03/09/15 0340 03/09/15 1300  NA 138 136  K 5.2* 4.6    CL 96 99  CO2 34* 28  GLUCOSE 117* 103*  BUN 18 17  CREATININE 0.91 0.88  CALCIUM 9.4 9.3    TELE NSR with HR 80s, frequent PACs    ECG  No new EKG  Echocardiogram 03/06/2015  - Left ventricle: The cavity size was moderately dilated. Systolic function was severely reduced. The estimated ejection fraction was in the range of 20% to 25%. Diffuse hypokinesis. - Aorta: Ascending aortic diameter: 40 mm (S). - Left atrium: The atrium was mildly dilated. - Right ventricle: The cavity size was mildly dilated. Wall thickness was normal. Systolic function was moderately reduced.     Radiology/Studies  Dg Chest 2 View  03/04/2015   CLINICAL DATA:  Chest pain and difficulty breathing since 4 p.m. today.  EXAM: CHEST  2 VIEW  COMPARISON:  None.  FINDINGS: The heart is borderline enlarged. There is tortuosity and mild ectasia of the thoracic aorta. There is peribronchial thickening and increased interstitial markings which could suggest chronic bronchitis, smoking changes, mild interstitial edema or interstitial pneumonitis. No pleural effusions. Streaky bibasilar atelectasis. The bony thorax is intact.  IMPRESSION: Borderline cardiac enlargement with acute versus chronic  interstitial change as detailed above.   Electronically Signed   By: Rudie Meyer M.D.   On: 03/04/2015 20:50    ASSESSMENT AND PLAN 68 obese male former smoker with HTN who presented to [facility] on [facility]15 with dyspnea, weight gain, LE edema, and chest pain. He was found to have new onset CHF and atrial flutter.   Newly diagnosed atrial flutter with RVR  - CHA2DS2- Vasc score 2 (CHF, HTN), started on eliquis  - S/p TEE/DCCV on 03/06/2015 EF 15-20%, mild MR. Will need to be on uninterrupted AC for at least 1 month  - Maintaining NSR after cardioversion however has frequent PACs on telemetry, coreg dose increased to 12.5mg  BID.   Acute systolic heart failure in the setting of prolonged a-fib with RVR  - Likely  tachy-mediated, also had CP prior to arrival. Differential includes tachycardia related or alcoholic cardiomyopathy, but ischemic heart disease is statistically most likely and will need to be excluded first. Will need coronary angiography once it is safe to interrupt anticoagulation. He also has many risk factors for CAD  - Sarted on coreg and Entresto; however entresto D/C'd due to hyperkalemia. Now normalized and will start a low dose of lisinopril .    - EF 15-20% on TEE, repeat TTE 03/06/2015 EF 20-25%, diffuse hypokinesis. EF 15-20% by TEE.  - Will need repeat echo in 3 month, if LVEF < 35%, may need ICD. Reevaluate need for ICD in 90 days, depending on need for revascularization. He was noted to have 14 beat run of NSVT. He is at high risk for SCD so a LifeVest has been placed.  - Continue  BID PO lasix. Need 1 wk BMET  - Complete ETOH abstinence.  - Daily weights and sodium restriction discussed.  Hyperkalemia- K jumped from 4.0 to 5.1 yesterday. Entresto held. Repeat BMET last night and K down to 4.4.   NSVT- 14 beat run noted on tele. Worrisome for ischemia. High risk for SCD with low EF, will place LifeVest.  -- Continue BB  Rash- noted on face and chest. Pruritic. Will order benadryl. On many new drugs. Continue to monitor --He has had facial rash before and thinks it has to do with his abrupt alcohol cessation since being in the hopsital  Diarrhea- he has two days of watery mucus-y diarrhea. CDif PCR pending. Low risk as he has not been on ABx recently, no white count or fever. Now diarrhea has stopped.   Morbid obesity- Body mass index is 45.96 kg/(m^2).  Elevated troponin: likely related to CHF, but has many RFs for CAD. He will need a LHC at some point. We can hopefully arrange this as an outpatient after he has completed 30 days of Eliquis after DCCV on 03/06/15.  ETOH abuse- advised complete abstinence. He is determined to quit  Possible OSA- need outpatient sleep  study  Pre-diabetes: A1C 6.1, followup with PCP.  -- Will need to establish care with new PCP  Elevated TSH: TSH 5.578. mildly elevated TSH could represent sick euthyroid sd. -- Follow up as an out patient   Cline Crock PA-C  [facility]    I have seen and examined the patient along with Cline Crock PA-C  [facility].  I have reviewed the chart, notes and new data.  I agree with PA's note.  Key new complaints: feels well, only one stool last 24h (strongly doubt C.diff) Key examination changes: no edema or rales Key new findings / data: no further VT  PLAN:  Early f/u in clinic and BMET DC on carvedilol, lisinopril, furosemide and Eliquis Needs coronary angio after 30 days uninterrupted Eliquis. Life Vest - reevaluate for ICD after 90 days (post revascularization if this is indicated) Outpatient sleep study. Discussed diet, exercise, sodium restriction, daily weights - bring weight log to clinic  Thurmon Fair, MD, [facility] [phone] 03/10/2015, 12:38 PM

## 2015-03-11 LAB — CLOSTRIDIUM DIFFICILE BY PCR: CDIFFPCR: NEGATIVE

## 2015-03-15 ENCOUNTER — Telehealth: Payer: Self-pay | Admitting: Cardiology

## 2015-03-15 ENCOUNTER — Other Ambulatory Visit: Payer: Self-pay | Admitting: *Deleted

## 2015-03-15 DIAGNOSIS — I5021 Acute systolic (congestive) heart failure: Secondary | ICD-10-CM

## 2015-03-15 DIAGNOSIS — E875 Hyperkalemia: Secondary | ICD-10-CM

## 2015-03-15 LAB — BASIC METABOLIC PANEL WITH GFR
BUN: 27 mg/dL — ABNORMAL HIGH (ref 6–23)
CALCIUM: 10 mg/dL (ref 8.4–10.5)
CO2: 28 mEq/L (ref 19–32)
CREATININE: 1 mg/dL (ref 0.50–1.35)
Chloride: 97 mEq/L (ref 96–112)
GFR, EST NON AFRICAN AMERICAN: 83 mL/min
GFR, Est African American: >89 mL/min
Glucose, Bld: 93 mg/dL (ref 70–99)
Potassium: 4.6 mEq/L (ref 3.5–5.3)
SODIUM: 135 meq/L (ref 135–145)

## 2015-03-15 NOTE — Telephone Encounter (Signed)
Bmp orders placed 

## 2015-03-20 ENCOUNTER — Other Ambulatory Visit: Payer: Self-pay | Admitting: *Deleted

## 2015-03-20 DIAGNOSIS — E875 Hyperkalemia: Secondary | ICD-10-CM

## 2015-03-22 ENCOUNTER — Other Ambulatory Visit (INDEPENDENT_AMBULATORY_CARE_PROVIDER_SITE_OTHER): Payer: 59 | Admitting: *Deleted

## 2015-03-22 ENCOUNTER — Encounter: Payer: Self-pay | Admitting: Nurse Practitioner

## 2015-03-22 ENCOUNTER — Ambulatory Visit (INDEPENDENT_AMBULATORY_CARE_PROVIDER_SITE_OTHER): Payer: 59 | Admitting: Nurse Practitioner

## 2015-03-22 VITALS — BP 110/70 | HR 86 | Resp 18 | Ht 73.0 in | Wt 349.0 lb

## 2015-03-22 DIAGNOSIS — R0600 Dyspnea, unspecified: Secondary | ICD-10-CM | POA: Diagnosis not present

## 2015-03-22 DIAGNOSIS — I5021 Acute systolic (congestive) heart failure: Secondary | ICD-10-CM | POA: Diagnosis not present

## 2015-03-22 LAB — BASIC METABOLIC PANEL
BUN: 25 mg/dL — ABNORMAL HIGH (ref 6–23)
CALCIUM: 10 mg/dL (ref 8.4–10.5)
CO2: 28 mEq/L (ref 19–32)
Chloride: 99 mEq/L (ref 96–112)
Creatinine, Ser: 0.97 mg/dL (ref 0.40–1.50)
GFR: 84.59 mL/min (ref >60.00)
Glucose, Bld: 98 mg/dL (ref 70–99)
POTASSIUM: 4.2 meq/L (ref 3.5–5.1)
SODIUM: 136 meq/L (ref 135–145)

## 2015-03-22 MED ORDER — CARVEDILOL 12.5 MG PO TABS: 18.7500 mg | ORAL_TABLET | Freq: Two times a day (BID) | ORAL | Status: DC

## 2015-03-22 NOTE — Addendum Note (Signed)
Addended by: Tonita Phoenix on: 03/22/2015 11:11 AM   Modules accepted: Orders

## 2015-03-22 NOTE — Progress Notes (Signed)
CARDIOLOGY OFFICE NOTE  Date:  03/22/2015    [name] Date of Birth: [birth year] Medical Record #[id number]  PCP:  No PCP Per Patient  Cardiologist:  [facility]    Chief Complaint  Patient presents with  . Congestive Heart Failure  . Atrial Fibrillation     History of Present Illness: Arthur Church is a 59 y.o. male who presents today for a follow up/post hospital visit. Seen for Dr. Rennis Golden. He has morbid obesity, ETOH abuse, prediabetes and elevated TSH.  Presented with new onset of acute systolic CHF in the setting of new onset atrial flutter s/p TEE/DCCV.  Tried on Entresto stopped due to hyperkalemia. Plan was to repeat echo in 3 months. May need to have cardiac cath given elevated troponin but has to complete 30 days of Eliquis prior to any procedure.   Comes in today. Here with his wife. He is doing ok. Overwhelmed with what all has transpired. Not short of breath. No swelling. Weight and BP ok at home. Has really changed his diet, less salt. Not drinking alcohol anymore. Does have a LifeVest in place. No palpitations reported. He is not dizzy or lightheaded. No syncope.   Past Medical History  Diagnosis Date  . Hypertension   . Chronic systolic CHF (congestive heart failure)     a. 2D ECHO (03/06/15) EF 20-25%, diffuse HK. Asc aortic diameter: 40mm. Mild LA dilation, mild RV dilation. Mild RV systolic dysfunction   b. LifeVest placed on 02/2015 admissoin   . Morbid obesity     a. BMI ~46  . Paroxysmal atrial flutter     a. newly dx 02/2015 admission. s/p successful TEE/DCCV  b. on Eliquis  . ETOH abuse   . Pre-diabetes     a. HgA1c 6.1  . Elevated TSH     Past Surgical History  Procedure Laterality Date  . Hernia repair    . Tee without cardioversion N/A 03/06/2015    Procedure: TRANSESOPHAGEAL ECHOCARDIOGRAM (TEE);  Surgeon: Laurey Morale, MD;  Location: [facility] ENDOSCOPY;  Service: Cardiovascular;  Laterality: N/A;  . Cardioversion N/A 03/06/2015    Procedure:  CARDIOVERSION;  Surgeon: Laurey Morale, MD;  Location: [facility] ENDOSCOPY;  Service: Cardiovascular;  Laterality: N/A;     Medications: Current Outpatient Prescriptions  Medication Sig Dispense Refill  . apixaban (ELIQUIS) 5 MG TABS tablet Take 1 tablet (5 mg total) by mouth 2 (two) times daily. 60 tablet 11  . atorvastatin (LIPITOR) 80 MG tablet Take 1 tablet (80 mg total) by mouth daily at 6 PM. 30 tablet 11  . carvedilol (COREG) 12.5 MG tablet Take 1.5 tablets (18.75 mg total) by mouth 2 (two) times daily with a meal. 60 tablet 11  . diphenhydrAMINE (BENADRYL) 25 MG tablet Take 25 mg by mouth every 6 (six) hours as needed for allergies.    . furosemide (LASIX) 40 MG tablet Take 1 tablet (40 mg total) by mouth 2 (two) times daily. 60 tablet 11  . lisinopril (PRINIVIL) 10 MG tablet Take 0.5 tablets (5 mg total) by mouth daily. 30 tablet 11  . loratadine (CLARITIN) 10 MG tablet Take 10 mg by mouth daily.    . sodium chloride (OCEAN) 0.65 % SOLN nasal spray Place 1 spray into both nostrils as needed for congestion.     No current facility-administered medications for this visit.    Allergies: No Known Allergies  Social History: The patient  reports that he has quit smoking. His smoking use  included Cigarettes. He has never used smokeless tobacco. He reports that he drinks alcohol. He reports that he does not use illicit drugs.   Family History: The patient's family history is not on file.  His parents are both deceased. Mother had an irregular heart beat.   Review of Systems: Please see the history of present illness.   All other systems are reviewed and negative.   Physical Exam: VS:  BP 110/70 mmHg  Pulse 86  Resp 18  Ht  (1.854 m)  Wt 349 lb (158.305 kg)  BMI 46.05 kg/m2  SpO2 94% .  BMI Body mass index is 46.05 kg/(m^2).  Wt Readings from Last 3 Encounters:  03/22/15 349 lb (158.305 kg)  03/10/15 348 lb 4.8 oz (157.988 kg)  05/19/14 326 lb 2 oz (147.929 kg)     General: Pleasant. Obese white male who is in no acute distress.  HEENT: Normal. Neck: Supple, no JVD, carotid bruits, or masses noted.  Cardiac: Irregular irregular rhythm. His rate is ok. No murmurs, rubs, or gallops. No edema.  Respiratory:  Lungs are clear to auscultation bilaterally with normal work of breathing.  GI: Soft and nontender.  MS: No deformity or atrophy. Gait and ROM intact. Skin: Warm and dry. Color is normal.  Neuro:  Strength and sensation are intact and no gross focal deficits noted.  Psych: Alert, appropriate and with normal affect.   LABORATORY DATA:  EKG:  EKG is ordered today. This demonstrates atrial fib with a controlled VR.  Lab Results  Component Value Date   WBC 10.0 03/07/2015   HGB 14.9 03/07/2015   HCT 46.1 03/07/2015   PLT 264 03/07/2015   GLUCOSE 93 03/15/2015   CHOL 195 03/05/2015   TRIG 78 03/05/2015   HDL 45 03/05/2015   LDLCALC 134* 03/05/2015   NA 135 03/15/2015   K 4.6 03/15/2015   CL 97 03/15/2015   CREATININE 1.00 03/15/2015   BUN 27* 03/15/2015   CO2 28 03/15/2015   TSH 5.578* 03/05/2015   HGBA1C 6.1* 03/05/2015    BNP (last 3 results)  Recent Labs  03/04/15 2023  BNP 410.6*    ProBNP (last 3 results) No results for input(s): PROBNP in the last 8760 hours.   Other Studies Reviewed Today:  Echo Study Conclusions from 02/2015  - Left ventricle: The cavity size was moderately dilated. Systolic function was severely reduced. The estimated ejection fraction was in the range of 20% to 25%. Diffuse hypokinesis. - Aorta: Ascending aortic diameter: 40 mm (S). - Left atrium: The atrium was mildly dilated. - Right ventricle: The cavity size was mildly dilated. Wall thickness was normal. Systolic function was moderately reduced.  Procedure: Electrical Cardioversion  Patient placed on cardiac monitor, pulse oximetry, supplemental oxygen as necessary.  Sedation given: Propofol per anesthesiology Pacer pads  placed anterior and posterior chest.  Cardioverted 1 time(s).  Cardioverted at 150J.  Evaluation Findings: Post procedure EKG shows: NSR Complications: None Patient did tolerate procedure well.   Marca Ancona 03/06/2015, 1:40 PM    Assessment/Plan: 1. Acute systolic heart failure - looks compensated. Will try to increase the Coreg to 18.75 mg BID. Recheck lab today - he has had a tendency towards hyperkalemia - this may limit how much ACE he can be on. See back in 2 to 3 weeks.  2. Atrial flutter - s/p cardioversion  3. Atrial fib - now in AF - his rate is controlled. He is anticoagulated with Eliquis. Most likely will need  AAD therapy. Will refer on to EP/AF clinic.  4. Elevated troponin - no symptoms - would hold on plan for cath until after we get his rhythm straightened out - possible repeat cardioversion - would not want to hold his anticoagulation at this time.   5. Alcohol abuse - not drinking  6. Obesity  Current medicines are reviewed with the patient today.  The patient does not have concerns regarding medicines other than what has been noted above.  The following changes have been made:  See above.  Labs/ tests ordered today include:    Orders Placed This Encounter  Procedures  . Brain natriuretic peptide  . Basic metabolic panel  . CBC  . Ambulatory referral to Cardiac Electrophysiology     Disposition:   FU with EP for referral. See Dr. Rennis Golden in 2 to 3 weeks  Patient is agreeable to this plan and will call if any problems develop in the interim.   Signed: Rosalio Macadamia, RN, ANP-C 03/22/2015 12:17 PM  [facility] [address] [place], [place]  [address] Phone: [phone] Fax: [phone]

## 2015-03-22 NOTE — Patient Instructions (Addendum)
We will be checking the following labs today BNP, BMET & CBC  Stay on your current medicines but I am increasing your Coreg today   Take 1 1/2 pills twice a day - this 18.75mg  two times a day  Keep restricting salt and weigh daily  Take extra dose of Lasix - fluid pill - for weight gain of over 3 pounds overnight  We will refer you to EP regarding your rhythm  See Dr. Rennis Golden in 2 to 3 weeks  Call the [facility] office at [phone] if you have any questions, problems or concerns.

## 2015-03-22 NOTE — Addendum Note (Signed)
Addended by: Serita Degroote K on: 03/22/2015 11:11 AM   Modules accepted: Orders  

## 2015-03-27 ENCOUNTER — Other Ambulatory Visit: Payer: Self-pay

## 2015-03-27 DIAGNOSIS — I5021 Acute systolic (congestive) heart failure: Secondary | ICD-10-CM

## 2015-03-27 DIAGNOSIS — I484 Atypical atrial flutter: Secondary | ICD-10-CM

## 2015-03-27 NOTE — Progress Notes (Signed)
Cindi Carbon, RN, called pt to come in on Friday for repeat labs.

## 2015-03-29 ENCOUNTER — Other Ambulatory Visit (INDEPENDENT_AMBULATORY_CARE_PROVIDER_SITE_OTHER): Payer: 59 | Admitting: *Deleted

## 2015-03-29 DIAGNOSIS — I5021 Acute systolic (congestive) heart failure: Secondary | ICD-10-CM

## 2015-03-29 DIAGNOSIS — I484 Atypical atrial flutter: Secondary | ICD-10-CM

## 2015-03-29 LAB — CBC WITH DIFFERENTIAL/PLATELET
Basophils Absolute: 0 10*3/uL (ref 0.0–0.1)
Basophils Relative: 0.4 % (ref 0.0–3.0)
Eosinophils Absolute: 0.4 10*3/uL (ref 0.0–0.7)
Eosinophils Relative: 3.3 % (ref 0.0–5.0)
HCT: 47 % (ref 39.0–52.0)
Hemoglobin: 15.7 g/dL (ref 13.0–17.0)
Lymphocytes Relative: 28.6 % (ref 12.0–46.0)
Lymphs Abs: 3.4 10*3/uL (ref 0.7–4.0)
MCHC: 33.5 g/dL (ref 30.0–36.0)
MCV: 86.2 fl (ref 78.0–100.0)
Monocytes Absolute: 1 10*3/uL (ref 0.1–1.0)
Monocytes Relative: 8.1 % (ref 3.0–12.0)
Neutro Abs: 7.2 10*3/uL (ref 1.4–7.7)
Neutrophils Relative %: 59.6 % (ref 43.0–77.0)
Platelets: 307 10*3/uL (ref 150.0–400.0)
RBC: 5.45 Mil/uL (ref 4.22–5.81)
RDW: 13.4 % (ref 11.5–15.5)
WBC: 12 10*3/uL — ABNORMAL HIGH (ref 4.0–10.5)

## 2015-03-29 LAB — BRAIN NATRIURETIC PEPTIDE: Pro B Natriuretic peptide (BNP): 172 pg/mL — ABNORMAL HIGH (ref 0.0–100.0)

## 2015-04-01 ENCOUNTER — Other Ambulatory Visit: Payer: Self-pay

## 2015-04-01 ENCOUNTER — Telehealth: Payer: Self-pay | Admitting: *Deleted

## 2015-04-01 DIAGNOSIS — I5021 Acute systolic (congestive) heart failure: Secondary | ICD-10-CM

## 2015-04-01 NOTE — Telephone Encounter (Signed)
Patient aware of lab results that were resulted from [place], [place], see there is already bmet scheduled for pt to come back.  Not sure if pt is aware. Will send to Cindi Carbon, RN to [facility] and call pt if need to come in on  April 22.

## 2015-04-03 ENCOUNTER — Ambulatory Visit (INDEPENDENT_AMBULATORY_CARE_PROVIDER_SITE_OTHER): Payer: 59 | Admitting: Family Medicine

## 2015-04-03 ENCOUNTER — Encounter: Payer: Self-pay | Admitting: Family Medicine

## 2015-04-03 VITALS — BP 113/73 | HR 45 | Ht 72.05 in | Wt 349.0 lb

## 2015-04-03 DIAGNOSIS — R946 Abnormal results of thyroid function studies: Secondary | ICD-10-CM | POA: Diagnosis not present

## 2015-04-03 DIAGNOSIS — R7309 Other abnormal glucose: Secondary | ICD-10-CM

## 2015-04-03 DIAGNOSIS — L740 Miliaria rubra: Secondary | ICD-10-CM

## 2015-04-03 DIAGNOSIS — R7989 Other specified abnormal findings of blood chemistry: Secondary | ICD-10-CM

## 2015-04-03 DIAGNOSIS — I5020 Unspecified systolic (congestive) heart failure: Secondary | ICD-10-CM | POA: Insufficient documentation

## 2015-04-03 DIAGNOSIS — I5022 Chronic systolic (congestive) heart failure: Secondary | ICD-10-CM

## 2015-04-03 DIAGNOSIS — R7303 Prediabetes: Secondary | ICD-10-CM

## 2015-04-03 DIAGNOSIS — L719 Rosacea, unspecified: Secondary | ICD-10-CM

## 2015-04-03 DIAGNOSIS — L409 Psoriasis, unspecified: Secondary | ICD-10-CM

## 2015-04-03 MED ORDER — TRIAMCINOLONE ACETONIDE 0.1 % EX CREA: TOPICAL_CREAM | CUTANEOUS | Status: DC

## 2015-04-03 NOTE — Progress Notes (Signed)
CC: Arthur Church is a 59 y.o. male is here for Establish Care   Subjective: HPI:  Pleasant 59 year old here to establish care  Currently been seen by cardiology and electrophysiology for recent hospitalization for acute systolic heart failure. He's currently on carvedilol and lisinopril without any known side effects. No outside blood pressures to report. He is weighing himself on a daily basis and has not noticed any change greater than 1 or 2 pounds in a 24-hour period. He continues to take Lasix 40 mg twice a day without any muscle cramping or excessive thirst. No peripheral edema or orthopnea.  While hospitalized he had a mildly elevated TSH and normal T3. He is unaware of this. He's never had an elevated or abnormal TSH that he knows of in the past but his daughter does suffer from hypothyroidism. He's had difficulty losing weight in the past however this was complicated by excessive alcohol use.   While in the hospital he was also found to have a A1c of 6.1. Since discharge she's been trying to eat much healthier and with reduced carbohydrates. He's never had an elevated A1c were blood sugar that he is aware of. No polyuria polyphagia or polydipsia.  His major complaint today is a rash involving his face and the back of the neck and the back between the shoulder blades. The regionbetween the shoulder blades is itchy the other spots are painless without any itch. No interventions as of yet. The rash on the face seems to get worse after a hot shower and is almost absent at the end of the day. He is uncertain when the rash on the face began but the rash on the back began ever since he had to start wearing a defibrillator LifeVest.  Review of Systems - General ROS: negative for - chills, fever, night sweats, weight gain or weight loss Ophthalmic ROS: negative for - decreased vision Psychological ROS: negative for - anxiety or depression ENT ROS: negative for - hearing change, nasal congestion,  tinnitus or allergies Hematological and Lymphatic ROS: negative for - bleeding problems, bruising or swollen lymph nodes Breast ROS: negative Respiratory ROS: no cough, shortness of breath, or wheezing Cardiovascular ROS: no chest pain or dyspnea on exertion Gastrointestinal ROS: no abdominal pain, change in bowel habits, or black or bloody stools Genito-Urinary ROS: negative for - genital discharge, genital ulcers, incontinence or abnormal bleeding from genitals Musculoskeletal ROS: negative for - joint pain or muscle pain Neurological ROS: negative for - headaches or memory loss Dermatological ROS: negative for lumps, mole changes, rash and skin lesion changes other than that described above  Past Medical History  Diagnosis Date  . Hypertension   . Chronic systolic CHF (congestive heart failure)     a. 2D ECHO (03/06/15) EF 20-25%, diffuse HK. Asc aortic diameter: 72mm. Mild LA dilation, mild RV dilation. Mild RV systolic dysfunction   b. LifeVest placed on 02/2015 admissoin   . Morbid obesity     a. BMI ~46  . Paroxysmal atrial flutter     a. newly dx 02/2015 admission. s/p successful TEE/DCCV  b. on Eliquis  . ETOH abuse   . Pre-diabetes     a. HgA1c 6.1  . Elevated TSH     Past Surgical History  Procedure Laterality Date  . Hernia repair    . Tee without cardioversion N/A 03/06/2015    Procedure: TRANSESOPHAGEAL ECHOCARDIOGRAM (TEE);  Surgeon: Laurey Morale, MD;  Location: [facility] ENDOSCOPY;  Service: Cardiovascular;  Laterality: N/A;  .  Cardioversion N/A 03/06/2015    Procedure: CARDIOVERSION;  Surgeon: Laurey Morale, MD;  Location: [facility] ENDOSCOPY;  Service: Cardiovascular;  Laterality: N/A;   Family History  Problem Relation Age of Onset  . Uterine cancer Sister   . Heart attack Mother   . Breast cancer Mother   . Sleep apnea Brother   . Heart attack Maternal Grandfather   . Sleep apnea Brother     History   Social History  . Marital Status: Married    Spouse Name: N/A   . Number of Children: N/A  . Years of Education: N/A   Occupational History  . Not on file.   Social History Main Topics  . Smoking status: Former Smoker    Types: Cigarettes    Quit date: 01/02/2013  . Smokeless tobacco: Never Used     Comment: smokes about a week  . Alcohol Use: 0.0 oz/week    0 Standard drinks or equivalent per week  . Drug Use: No  . Sexual Activity: Not Currently   Other Topics Concern  . Not on file   Social History Narrative     Objective: BP 113/73 mmHg  Pulse 45  Ht 6' 0.05" (1.83 m)  Wt 349 lb (158.305 kg)  BMI 47.27 kg/m2  General: Alert and Oriented, No Acute Distress HEENT: Pupils equal, round, reactive to light. Conjunctivae clear. Moist mucous membranes pharynx unremarkable Lungs: Clear to auscultation bilaterally, no wheezing/ronchi/rales.  Comfortable work of breathing. Good air movement. Cardiac: Regular rate and rhythm,estimated rate at 60-60 5 bpm. Normal S1/S2.  No murmurs, rubs, nor gallops.   Abdomen: obese and soft Extremities: No peripheral edema.  Strong peripheral pulses.  Mental Status: No depression, anxiety, nor agitation. Skin: Warm and dry. Heat rash and allergic dermatitis appearance  Of a 10 x 10 cm patch of erythema between the shoulder blades. On the face he has a erythematous patch of discoloration involving the nose and just beneath the eyes. Around the scalp line there is some mild flaking anteriorly and posteriorly  Assessment & Plan: Arthur Church was seen today for establish care.  Diagnoses and all orders for this visit:  Chronic systolic heart failure  Elevated TSH Orders: -     TSH -     T4, free -     T3, free  Pre-diabetes  Rosacea  Psoriasis Orders: -     triamcinolone cream (KENALOG) 0.1 %; Apply to affected areas twice a day for up to two weeks, avoid face.  Heat rash   Chronic systolic heart failure: Currently controlled, weight is stable, continue management with cardiology note adjustment  in medications today. Prediabetes: Congratulated his his efforts at quitting alcohol use and starting  To be much more healthy. Plan to recheck A1c in 2 months. Continue walking regimen and current diet. Elevated TSH: Repeat TSH T3 and T4 Rosacea: Discussed treatment options however he understandably does not want to pursue any treatment of this since it does not bother him cosmetically.  psoriasis: Begin as needed use of triamcinolone cream to flaky Patches around the scalp line Heat rash on the back: Begin as needed use of triamcinolone cream for the next 2 weeks and begin focusing on keeping this region dry and clean.  Return in about 3 months (around 07/03/2015) for blood sugar.

## 2015-04-05 ENCOUNTER — Ambulatory Visit: Payer: 59 | Admitting: Cardiology

## 2015-04-05 ENCOUNTER — Other Ambulatory Visit (INDEPENDENT_AMBULATORY_CARE_PROVIDER_SITE_OTHER): Payer: 59 | Admitting: *Deleted

## 2015-04-05 ENCOUNTER — Ambulatory Visit (INDEPENDENT_AMBULATORY_CARE_PROVIDER_SITE_OTHER): Payer: 59 | Admitting: Internal Medicine

## 2015-04-05 ENCOUNTER — Encounter: Payer: Self-pay | Admitting: Internal Medicine

## 2015-04-05 ENCOUNTER — Other Ambulatory Visit: Payer: Self-pay | Admitting: *Deleted

## 2015-04-05 VITALS — BP 137/88 | HR 79 | Ht 72.5 in | Wt 349.0 lb

## 2015-04-05 DIAGNOSIS — I5021 Acute systolic (congestive) heart failure: Secondary | ICD-10-CM | POA: Diagnosis not present

## 2015-04-05 DIAGNOSIS — I484 Atypical atrial flutter: Secondary | ICD-10-CM

## 2015-04-05 DIAGNOSIS — R7989 Other specified abnormal findings of blood chemistry: Secondary | ICD-10-CM

## 2015-04-05 DIAGNOSIS — R946 Abnormal results of thyroid function studies: Secondary | ICD-10-CM | POA: Diagnosis not present

## 2015-04-05 LAB — BASIC METABOLIC PANEL
BUN: 25 mg/dL — ABNORMAL HIGH (ref 6–23)
CHLORIDE: 99 meq/L (ref 96–112)
CO2: 31 meq/L (ref 19–32)
Calcium: 9.9 mg/dL (ref 8.4–10.5)
Creatinine, Ser: 0.94 mg/dL (ref 0.40–1.50)
GFR: 87.7 mL/min (ref >60.00)
Glucose, Bld: 96 mg/dL (ref 70–99)
Potassium: 4.2 mEq/L (ref 3.5–5.1)
Sodium: 136 mEq/L (ref 135–145)

## 2015-04-05 LAB — T3, FREE: T3, Free: 3 pg/mL (ref 2.3–4.2)

## 2015-04-05 LAB — MAGNESIUM: MAGNESIUM: 2 mg/dL (ref 1.5–2.5)

## 2015-04-05 MED ORDER — APIXABAN 5 MG PO TABS: 5.0000 mg | ORAL_TABLET | Freq: Two times a day (BID) | ORAL | Status: DC

## 2015-04-05 NOTE — Patient Instructions (Signed)
Your physician recommends that you return for lab work BMP/MAG/T3 FREE  You have been referred FOR OUTPATIENT SLEEP STUDY   Cordelia Pen will call you with follow up  Thank you for choosing [facility]!!

## 2015-04-05 NOTE — Progress Notes (Signed)
ELECTROPHYSIOLOGY CONSULT NOTE  Patient ID: Arthur Church, MRN: [id number], DOB/AGE: [birth year] 59 y.o. Admit date: (Not on file) Date of Consult: 04/05/2015  Primary Physician: Laren Boom, DO Primary Cardiologist: [facility]  Chief Complaint: Atrial fibrillation   HPI Arthur Church is a 59 y.o. male  Seen for management of atrial arrhythmias.  He has a history of atrial flutter and fibrillation.Marland Kitchen He presented with congestive heart failure and asymptomatic rapid atrial flutter and was found to be in florid congestive failure. He was found to have severe LV dysfunction and surprisingly normal atrial sizes. ;  it was thought and hoped that his cardiomyopathy was tachycardia mediated. Left atrial dimension was 44/1.5??/30  He was treated with carvedilol and Entresto, the second was discontinued because of hyperkalemia and he was treated with low-dose lisinopril. She is apparently tolerated.  He was considered high risk for sudden death and a LifeVest was placed. Nonsustained ventricular tachycardia had been noted.  Unfortunately he reverted to atrial fibrillation albeit now with controlled ventricular response  He feels great. He is sleeping better than he has in months. He has more energy than he has had in months. He has no peripheral edema. He has no palpitations.  His sleep disordered breathing and some daytime somnolence. A sleep study has been discussed but not yet ordered   Ejection fraction by echocardiogram 3/16 was 20-25%. There is significant left ventricular chamber dilatation as well as RV enlargement with dilatation and biatrial dilatation.   Past Medical History  Diagnosis Date  . Hypertension   . Chronic systolic CHF (congestive heart failure)     a. 2D ECHO (03/06/15) EF 20-25%, diffuse HK. Asc aortic diameter: 40mm. Mild LA dilation, mild RV dilation. Mild RV systolic dysfunction   b. LifeVest placed on 02/2015 admissoin   . Morbid obesity     a. BMI ~46  .  Paroxysmal atrial flutter     a. newly dx 02/2015 admission. s/p successful TEE/DCCV  b. on Eliquis  . ETOH abuse   . Pre-diabetes     a. HgA1c 6.1  . Elevated TSH       Surgical History:  Past Surgical History  Procedure Laterality Date  . Hernia repair    . Tee without cardioversion N/A 03/06/2015    Procedure: TRANSESOPHAGEAL ECHOCARDIOGRAM (TEE);  Surgeon: Laurey Morale, MD;  Location: [facility] ENDOSCOPY;  Service: Cardiovascular;  Laterality: N/A;  . Cardioversion N/A 03/06/2015    Procedure: CARDIOVERSION;  Surgeon: Laurey Morale, MD;  Location: [facility] ENDOSCOPY;  Service: Cardiovascular;  Laterality: N/A;     Home Meds: Prior to Admission medications   Medication Sig Start Date End Date Taking? Authorizing Provider  apixaban (ELIQUIS) 5 MG TABS tablet Take 1 tablet (5 mg total) by mouth 2 (two) times daily. 03/10/15  Yes Janetta Hora, PA-C  atorvastatin (LIPITOR) 80 MG tablet Take 1 tablet (80 mg total) by mouth daily at 6 PM. 03/10/15  Yes Janetta Hora, PA-C  carvedilol (COREG) 12.5 MG tablet Take 1.5 tablets (18.75 mg total) by mouth 2 (two) times daily with a meal. 03/22/15  Yes Rosalio Macadamia, NP  furosemide (LASIX) 40 MG tablet Take 1 tablet (40 mg total) by mouth 2 (two) times daily. 03/10/15  Yes Janetta Hora, PA-C  lisinopril (PRINIVIL) 10 MG tablet Take 0.5 tablets (5 mg total) by mouth daily. 03/10/15  Yes Janetta Hora, PA-C  triamcinolone cream (KENALOG) 0.1 % Apply to affected areas twice a day  for up to two weeks, avoid face. 04/03/15 04/02/16 Yes Sean Hommel, DO    I  Allergies: No Known Allergies  History   Social History  . Marital Status: Married    Spouse Name: N/A  . Number of Children: N/A  . Years of Education: N/A   Occupational History  . Not on file.   Social History Main Topics  . Smoking status: Former Smoker    Types: Cigarettes    Quit date: 01/02/2013  . Smokeless tobacco: Never Used     Comment: smokes about a week  .  Alcohol Use: 0.0 oz/week    0 Standard drinks or equivalent per week  . Drug Use: No  . Sexual Activity: Not Currently   Other Topics Concern  . Not on file   Social History Narrative     Family History  Problem Relation Age of Onset  . Uterine cancer Sister   . Heart attack Mother   . Breast cancer Mother   . Sleep apnea Brother   . Heart attack Maternal Grandfather   . Sleep apnea Brother      ROS:  Please see the history of present illness.     All other systems reviewed and negative.    Physical Exam:   Blood pressure 137/88, pulse 79, height 6' 0.5" (1.842 m), weight 349 lb (158.305 kg). General: Well developed, Morbidly obese  male in no acute distress. Head: Normocephalic, atraumatic, sclera non-icteric, no xanthomas, nares are without discharge. EENT: normal Lymph Nodes:  none Back: without scoliosis/kyphosis , no CVA tendersness Neck: Negative for carotid bruits. JVD not elevated. Lungs: Clear bilaterally to auscultation without wheezes, rales, or rhonchi. Breathing is unlabored. Heart: RRR with S1 S2. No  murmur , rubs, or gallops appreciated. Abdomen: Soft, non-tender, non-distended with normoactive bowel sounds. No hepatomegaly. No rebound/guarding. No obvious abdominal masses. Msk:  Strength and tone appear normal for age. Extremities: No clubbing or cyanosis. No* edema.  Distal pedal pulses are 2+ and equal bilaterally. Skin: Warm and Dry Neuro: Alert and oriented X 3. CN III-XII intact Grossly normal sensory and motor function . Psych:  Responds to questions appropriately with a normal affect.      Labs: Cardiac Enzymes No results for input(s): CKTOTAL, CKMB, TROPONINI in the last 72 hours. CBC Lab Results  Component Value Date   WBC 12.0* 03/29/2015   HGB 15.7 03/29/2015   HCT 47.0 03/29/2015   MCV 86.2 03/29/2015   PLT 307.0 03/29/2015   PROTIME: No results for input(s): LABPROT, INR in the last 72 hours. Chemistry No results for input(s): NA,  K, CL, CO2, BUN, CREATININE, CALCIUM, PROT, BILITOT, ALKPHOS, ALT, AST, GLUCOSE in the last 168 hours.  Invalid input(s): LABALBU Lipids Lab Results  Component Value Date   CHOL 195 03/05/2015   HDL 45 03/05/2015   LDLCALC 134* 03/05/2015   TRIG 78 03/05/2015   BNP PRO B NATRIURETIC PEPTIDE (BNP)  Date/Time Value Ref Range Status  03/29/2015 01:34 PM 172.0* 0.0 - 100.0 pg/mL Final   Thyroid Function Tests: No results for input(s): TSH, T4TOTAL, T3FREE, THYROIDAB in the last 72 hours.  Invalid input(s): FREET3    Miscellaneous No results found for: DDIMER  Radiology/Studies:  No results found.  EKG:  Atrial fibrillation at 76 Intervals-/09/38   Assessment and Plan:   Atrial fibrillation/flutter  Nonischemic cardiomyopathy  Morbid obesity  Probable sleep apnea  He is feeling amazingly well with restoration of a controlled heart rate not withstanding the  persistence of atrial fibrillation. We have discussed the lack of data regarding outcomes and rate versus rhythm control especially given his paucity of symptoms. However, given his young age and his normal left atrial dimensions, I think it is reasonable to undertake the use of dofetilide to try to restore and maintain sinus rhythm. There may well be a role for catheter ablation however, in the absence of data from [facility], we don't know the answer to this question. It is also appropriate that in about 2 months repeat assessment of LV function be obtained and hopefully his cardiomyopathy will have resolved.  In anticipation of admission for dofetilide loading, we will check his magnesium level and potassium level today. He comes over with a requisition for blood work for thyroid function testing; laboratories from the hospital reviewed. His TSH was mildly elevated at 5.57. His T4 was measured and was normal. We will measure his T3.  We will also arrange for an outpatient sleep study.    Sherryl Manges

## 2015-04-06 ENCOUNTER — Emergency Department (HOSPITAL_COMMUNITY): Payer: 59

## 2015-04-06 ENCOUNTER — Emergency Department (HOSPITAL_COMMUNITY)
Admission: EM | Admit: 2015-04-06 | Discharge: 2015-04-06 | Disposition: A | Discharge status: Home / Self Care | Payer: 59 | Attending: Emergency Medicine | Admitting: Emergency Medicine

## 2015-04-06 ENCOUNTER — Encounter (HOSPITAL_COMMUNITY): Payer: Self-pay | Admitting: *Deleted

## 2015-04-06 DIAGNOSIS — Y9389 Activity, other specified: Secondary | ICD-10-CM | POA: Diagnosis not present

## 2015-04-06 DIAGNOSIS — Y92410 Unspecified street and highway as the place of occurrence of the external cause: Secondary | ICD-10-CM | POA: Insufficient documentation

## 2015-04-06 DIAGNOSIS — Z7901 Long term (current) use of anticoagulants: Secondary | ICD-10-CM | POA: Insufficient documentation

## 2015-04-06 DIAGNOSIS — Y998 Other external cause status: Secondary | ICD-10-CM | POA: Diagnosis not present

## 2015-04-06 DIAGNOSIS — Z87891 Personal history of nicotine dependence: Secondary | ICD-10-CM | POA: Insufficient documentation

## 2015-04-06 DIAGNOSIS — I10 Essential (primary) hypertension: Secondary | ICD-10-CM | POA: Insufficient documentation

## 2015-04-06 DIAGNOSIS — Z23 Encounter for immunization: Secondary | ICD-10-CM | POA: Insufficient documentation

## 2015-04-06 DIAGNOSIS — Z79899 Other long term (current) drug therapy: Secondary | ICD-10-CM | POA: Insufficient documentation

## 2015-04-06 DIAGNOSIS — I5022 Chronic systolic (congestive) heart failure: Secondary | ICD-10-CM | POA: Diagnosis not present

## 2015-04-06 DIAGNOSIS — S20212A Contusion of left front wall of thorax, initial encounter: Secondary | ICD-10-CM | POA: Diagnosis not present

## 2015-04-06 DIAGNOSIS — I4892 Unspecified atrial flutter: Secondary | ICD-10-CM | POA: Diagnosis not present

## 2015-04-06 DIAGNOSIS — S299XXA Unspecified injury of thorax, initial encounter: Secondary | ICD-10-CM | POA: Diagnosis present

## 2015-04-06 LAB — I-STAT CHEM 8, ED
BUN: 29 mg/dL — AB (ref 6–23)
CALCIUM ION: 1.12 mmol/L (ref 1.12–1.23)
CHLORIDE: 100 mmol/L (ref 96–112)
CREATININE: 1 mg/dL (ref 0.50–1.35)
Glucose, Bld: 121 mg/dL — ABNORMAL HIGH (ref 70–99)
HCT: 52 % (ref 39.0–52.0)
Hemoglobin: 17.7 g/dL — ABNORMAL HIGH (ref 13.0–17.0)
Potassium: 4.4 mmol/L (ref 3.5–5.1)
SODIUM: 141 mmol/L (ref 135–145)
TCO2: 24 mmol/L (ref 0–100)

## 2015-04-06 MED ORDER — HYDROCODONE-ACETAMINOPHEN 5-325 MG PO TABS: 1.0000 | ORAL_TABLET | Freq: Four times a day (QID) | ORAL | Status: DC | PRN

## 2015-04-06 MED ORDER — TETANUS-DIPHTH-ACELL PERTUSSIS 5-2.5-18.5 LF-MCG/0.5 IM SUSP
0.5000 mL | Freq: Once | INTRAMUSCULAR | Status: AC
Start: 2015-04-06 — End: 2015-04-06
  Administered 2015-04-06: 0.5 mL via INTRAMUSCULAR
  Filled 2015-04-06: qty 0.5

## 2015-04-06 MED ORDER — IOHEXOL 300 MG/ML  SOLN
80.0000 mL | Freq: Once | INTRAMUSCULAR | Status: AC | PRN
Start: 2015-04-06 — End: 2015-04-06
  Administered 2015-04-06: 80 mL via INTRAVENOUS
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
## 2015-04-06 NOTE — Discharge Instructions (Signed)
Follow up with your md as needed °

## 2015-04-12 ENCOUNTER — Ambulatory Visit (INDEPENDENT_AMBULATORY_CARE_PROVIDER_SITE_OTHER): Payer: 59 | Admitting: Internal Medicine

## 2015-04-12 ENCOUNTER — Encounter: Payer: Self-pay | Admitting: Internal Medicine

## 2015-04-12 VITALS — BP 126/80 | HR 80 | Ht 72.5 in | Wt 347.0 lb

## 2015-04-12 DIAGNOSIS — I483 Typical atrial flutter: Secondary | ICD-10-CM | POA: Diagnosis not present

## 2015-04-12 DIAGNOSIS — I484 Atypical atrial flutter: Secondary | ICD-10-CM | POA: Diagnosis not present

## 2015-04-12 DIAGNOSIS — I5021 Acute systolic (congestive) heart failure: Secondary | ICD-10-CM | POA: Diagnosis not present

## 2015-04-12 DIAGNOSIS — R778 Other specified abnormalities of plasma proteins: Secondary | ICD-10-CM

## 2015-04-12 DIAGNOSIS — R7989 Other specified abnormal findings of blood chemistry: Secondary | ICD-10-CM

## 2015-04-12 NOTE — Patient Instructions (Signed)
Your physician recommends that you schedule a follow-up appointment in: 1 month with Dr.Hilty.  
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
## 2015-04-15 ENCOUNTER — Telehealth: Payer: Self-pay | Admitting: *Deleted

## 2015-04-15 DIAGNOSIS — D689 Coagulation defect, unspecified: Secondary | ICD-10-CM

## 2015-04-15 DIAGNOSIS — I5021 Acute systolic (congestive) heart failure: Secondary | ICD-10-CM

## 2015-04-15 DIAGNOSIS — Z01812 Encounter for preprocedural laboratory examination: Secondary | ICD-10-CM

## 2015-04-15 DIAGNOSIS — Z79899 Other long term (current) drug therapy: Secondary | ICD-10-CM

## 2015-04-15 DIAGNOSIS — R5383 Other fatigue: Secondary | ICD-10-CM

## 2015-04-15 DIAGNOSIS — I214 Non-ST elevation (NSTEMI) myocardial infarction: Secondary | ICD-10-CM

## 2015-04-15 NOTE — Telephone Encounter (Signed)
Called patient to inform him that Dr. Rennis Golden would like to proceed with ordering a LHC for him. Patient voiced understanding. Patient notified to HOLD eliquis 2 days prior to procedure. Patient notified to have labs 3-5 days prior to procedure. Patient prefers procedure to be on Friday. Patient will be out of town may 19th-29th.   LHC & labs ordered.   Staff message sent to scheduler to contact patient to scheduler procedure.

## 2015-04-17 ENCOUNTER — Telehealth: Payer: Self-pay | Admitting: Internal Medicine

## 2015-04-17 ENCOUNTER — Encounter: Payer: Self-pay | Admitting: Interventional Cardiology

## 2015-04-17 NOTE — Telephone Encounter (Signed)
5.4.16 Received signed and completed FMLA form from Julaine Fusi, [employer] fundraiser.  Contacted patient and advised forms were signed and ready.  Patient requested they be faxed to Leave of Absence Department with his employer as indicated on FMLA form and a copy be mailed to him.  Faxed to employer and mailed his copy. lp

## 2015-04-19 ENCOUNTER — Other Ambulatory Visit: Payer: Self-pay | Admitting: *Deleted

## 2015-04-19 DIAGNOSIS — IMO0002 Reserved for concepts with insufficient information to code with codable children: Secondary | ICD-10-CM

## 2015-04-19 DIAGNOSIS — I252 Old myocardial infarction: Secondary | ICD-10-CM

## 2015-04-22 ENCOUNTER — Telehealth: Payer: Self-pay | Admitting: Internal Medicine

## 2015-04-22 MED ORDER — CARVEDILOL 12.5 MG PO TABS: 18.7500 mg | ORAL_TABLET | Freq: Two times a day (BID) | ORAL | Status: DC

## 2015-04-22 NOTE — Telephone Encounter (Signed)
Refill submitted to patient's preferred pharmacy. Informed patient. Pt voiced understanding, no other stated concerns at this time.  

## 2015-04-22 NOTE — Telephone Encounter (Signed)
°  1. Which medications need to be refilled? Carvedilol-new prescription because his dose increased. 2. Which pharmacy is medication to be sent to?[facility] [phone]  3. Do they need a 30 day or 90 day supply?#90 and refills  4. Would they like a call back once the medication has been sent to the pharmacy? yes

## 2015-04-23 ENCOUNTER — Other Ambulatory Visit (HOSPITAL_COMMUNITY)
Admission: RE | Admit: 2015-04-23 | Discharge: 2015-04-23 | Disposition: A | Discharge status: Home / Self Care | Payer: 59 | Source: Ambulatory Visit | Attending: Internal Medicine | Admitting: Internal Medicine

## 2015-04-24 LAB — BASIC METABOLIC PANEL
BUN: 20 mg/dL (ref 6–23)
CHLORIDE: 100 meq/L (ref 96–112)
CO2: 27 mEq/L (ref 19–32)
CREATININE: 1.08 mg/dL (ref 0.50–1.35)
Calcium: 9.4 mg/dL (ref 8.4–10.5)
Glucose, Bld: 89 mg/dL (ref 70–99)
Potassium: 4.6 mEq/L (ref 3.5–5.3)
Sodium: 140 mEq/L (ref 135–145)

## 2015-04-24 LAB — CBC
HEMATOCRIT: 43.8 % (ref 39.0–52.0)
Hemoglobin: 15 g/dL (ref 13.0–17.0)
MCH: 29.1 pg (ref 26.0–34.0)
MCHC: 34.2 g/dL (ref 30.0–36.0)
MCV: 85 fL (ref 78.0–100.0)
MPV: 10.7 fL (ref 8.6–12.4)
PLATELETS: 266 10*3/uL (ref 150–400)
RBC: 5.15 MIL/uL (ref 4.22–5.81)
RDW: 13.7 % (ref 11.5–15.5)
WBC: 10.9 10*3/uL — ABNORMAL HIGH (ref 4.0–10.5)

## 2015-04-24 LAB — PROTIME-INR
INR: 1.17 (ref <1.50)
Prothrombin Time: 14.9 seconds (ref 11.6–15.2)

## 2015-04-24 LAB — TSH: TSH: 2.307 u[IU]/mL (ref 0.350–4.500)

## 2015-04-24 LAB — APTT: APTT: 34 s (ref 24–37)

## 2015-04-26 ENCOUNTER — Encounter (HOSPITAL_COMMUNITY): Payer: Self-pay | Admitting: Interventional Cardiology

## 2015-04-26 ENCOUNTER — Ambulatory Visit (HOSPITAL_COMMUNITY)
Admission: RE | Admit: 2015-04-26 | Discharge: 2015-04-26 | Disposition: A | Discharge status: Home / Self Care | Payer: 59 | Source: Ambulatory Visit | Attending: Interventional Cardiology | Admitting: Interventional Cardiology

## 2015-04-26 ENCOUNTER — Encounter (HOSPITAL_COMMUNITY)
Admission: RE | Disposition: A | Discharge status: Home / Self Care | Payer: Self-pay | Source: Ambulatory Visit | Attending: Interventional Cardiology

## 2015-04-26 ENCOUNTER — Encounter (HOSPITAL_COMMUNITY)
Admission: RE | Disposition: A | Discharge status: Home / Self Care | Payer: 59 | Source: Ambulatory Visit | Attending: Interventional Cardiology

## 2015-04-26 DIAGNOSIS — I5021 Acute systolic (congestive) heart failure: Secondary | ICD-10-CM | POA: Diagnosis present

## 2015-04-26 DIAGNOSIS — I5022 Chronic systolic (congestive) heart failure: Secondary | ICD-10-CM | POA: Insufficient documentation

## 2015-04-26 DIAGNOSIS — I4892 Unspecified atrial flutter: Secondary | ICD-10-CM | POA: Diagnosis not present

## 2015-04-26 DIAGNOSIS — I10 Essential (primary) hypertension: Secondary | ICD-10-CM | POA: Insufficient documentation

## 2015-04-26 DIAGNOSIS — I5020 Unspecified systolic (congestive) heart failure: Secondary | ICD-10-CM | POA: Diagnosis present

## 2015-04-26 DIAGNOSIS — I2584 Coronary atherosclerosis due to calcified coronary lesion: Secondary | ICD-10-CM | POA: Insufficient documentation

## 2015-04-26 DIAGNOSIS — I252 Old myocardial infarction: Secondary | ICD-10-CM

## 2015-04-26 DIAGNOSIS — I2510 Atherosclerotic heart disease of native coronary artery without angina pectoris: Secondary | ICD-10-CM | POA: Insufficient documentation

## 2015-04-26 DIAGNOSIS — E875 Hyperkalemia: Secondary | ICD-10-CM | POA: Insufficient documentation

## 2015-04-26 DIAGNOSIS — E785 Hyperlipidemia, unspecified: Secondary | ICD-10-CM | POA: Diagnosis not present

## 2015-04-26 DIAGNOSIS — Z87891 Personal history of nicotine dependence: Secondary | ICD-10-CM | POA: Insufficient documentation

## 2015-04-26 DIAGNOSIS — IMO0002 Reserved for concepts with insufficient information to code with codable children: Secondary | ICD-10-CM

## 2015-04-26 HISTORY — PX: CARDIAC CATHETERIZATION: SHX172

## 2015-04-26 LAB — BASIC METABOLIC PANEL
Anion gap: 10 (ref 5–15)
BUN: 17 mg/dL (ref 6–20)
CALCIUM: 9.2 mg/dL (ref 8.9–10.3)
CO2: 29 mmol/L (ref 22–32)
CREATININE: 0.86 mg/dL (ref 0.61–1.24)
Chloride: 100 mmol/L — ABNORMAL LOW (ref 101–111)
GFR calc non Af Amer: >60 mL/min (ref >60)
Glucose, Bld: 136 mg/dL — ABNORMAL HIGH (ref 65–99)
POTASSIUM: 3.7 mmol/L (ref 3.5–5.1)
Sodium: 139 mmol/L (ref 135–145)

## 2015-04-26 SURGERY — LEFT HEART CATH AND CORONARY ANGIOGRAPHY
Anesthesia: LOCAL

## 2015-04-26 SURGERY — LEFT HEART CATHETERIZATION WITH CORONARY ANGIOGRAM
Anesthesia: LOCAL

## 2015-04-26 MED ORDER — VERAPAMIL HCL 2.5 MG/ML IV SOLN
INTRAVENOUS | Status: DC | PRN
  Administered 2015-04-26: 09:00:00 via INTRA_ARTERIAL

## 2015-04-26 MED ORDER — IOHEXOL 350 MG/ML SOLN
INTRAVENOUS | Status: DC | PRN
  Administered 2015-04-26: 125 mL via INTRA_ARTERIAL

## 2015-04-26 MED ORDER — ASPIRIN 81 MG PO CHEW
81.0000 mg | CHEWABLE_TABLET | ORAL | Status: AC
  Administered 2015-04-26: 81 mg via ORAL

## 2015-04-26 MED ORDER — VERAPAMIL HCL 2.5 MG/ML IV SOLN
INTRAVENOUS | Status: AC
  Filled 2015-04-26: qty 2

## 2015-04-26 MED ORDER — SODIUM CHLORIDE 0.9 % IJ SOLN: 3.0000 mL | Freq: Two times a day (BID) | INTRAMUSCULAR | Status: DC

## 2015-04-26 MED ORDER — ONDANSETRON HCL 4 MG/2ML IJ SOLN: 4.0000 mg | Freq: Four times a day (QID) | INTRAMUSCULAR | Status: DC | PRN

## 2015-04-26 MED ORDER — MIDAZOLAM HCL 2 MG/2ML IJ SOLN
INTRAMUSCULAR | Status: AC
  Filled 2015-04-26: qty 2

## 2015-04-26 MED ORDER — HEPARIN SODIUM (PORCINE) 1000 UNIT/ML IJ SOLN
INTRAMUSCULAR | Status: DC | PRN
  Administered 2015-04-26: 6000 [IU] via INTRAVENOUS

## 2015-04-26 MED ORDER — MIDAZOLAM HCL 2 MG/2ML IJ SOLN
INTRAMUSCULAR | Status: DC | PRN
  Administered 2015-04-26 (×2): 1 mg via INTRAVENOUS

## 2015-04-26 MED ORDER — HEPARIN SODIUM (PORCINE) 1000 UNIT/ML IJ SOLN
INTRAMUSCULAR | Status: AC
  Filled 2015-04-26: qty 1

## 2015-04-26 MED ORDER — SODIUM CHLORIDE 0.9 % IJ SOLN: 3.0000 mL | INTRAMUSCULAR | Status: DC | PRN

## 2015-04-26 MED ORDER — LIDOCAINE HCL (PF) 1 % IJ SOLN
INTRAMUSCULAR | Status: AC
  Filled 2015-04-26: qty 30

## 2015-04-26 MED ORDER — NITROGLYCERIN 1 MG/10 ML FOR IR/CATH LAB
INTRA_ARTERIAL | Status: AC
  Filled 2015-04-26: qty 10

## 2015-04-26 MED ORDER — SODIUM CHLORIDE 0.9 % IV SOLN
INTRAVENOUS | Status: DC
  Administered 2015-04-26: 1000 mL via INTRAVENOUS

## 2015-04-26 MED ORDER — FENTANYL CITRATE (PF) 100 MCG/2ML IJ SOLN
INTRAMUSCULAR | Status: AC
  Filled 2015-04-26: qty 2

## 2015-04-26 MED ORDER — HEPARIN (PORCINE) IN NACL 2-0.9 UNIT/ML-% IJ SOLN
INTRAMUSCULAR | Status: AC
  Filled 2015-04-26: qty 1000

## 2015-04-26 MED ORDER — OXYCODONE-ACETAMINOPHEN 5-325 MG PO TABS: 1.0000 | ORAL_TABLET | ORAL | Status: DC | PRN

## 2015-04-26 MED ORDER — FENTANYL CITRATE (PF) 100 MCG/2ML IJ SOLN
INTRAMUSCULAR | Status: DC | PRN
  Administered 2015-04-26 (×2): 50 ug via INTRAVENOUS

## 2015-04-26 MED ORDER — ASPIRIN 81 MG PO CHEW
CHEWABLE_TABLET | ORAL | Status: AC
  Administered 2015-04-26: 81 mg via ORAL
  Filled 2015-04-26: qty 1

## 2015-04-26 MED ORDER — ACETAMINOPHEN 325 MG PO TABS: 650.0000 mg | ORAL_TABLET | ORAL | Status: DC | PRN

## 2015-04-26 MED ORDER — SODIUM CHLORIDE 0.9 % IV SOLN: 250.0000 mL | INTRAVENOUS | Status: DC | PRN

## 2015-04-26 MED ORDER — SODIUM CHLORIDE 0.9 % WEIGHT BASED INFUSION: 1.0000 mL/kg/h | INTRAVENOUS | Status: DC

## 2015-04-26 SURGICAL SUPPLY — 10 items
CATH INFINITI 5 FR JL3.5 (CATHETERS) ×2 IMPLANT
CATH INFINITI 5FR ANG PIGTAIL (CATHETERS) ×2 IMPLANT
CATH INFINITI JR4 5F (CATHETERS) ×2 IMPLANT
DEVICE RAD COMP TR BAND LRG (VASCULAR PRODUCTS) ×2 IMPLANT
GLIDESHEATH SLEND A-KIT 6F 22G (SHEATH) ×2 IMPLANT
KIT HEART LEFT (KITS) ×2 IMPLANT
PACK CARDIAC CATHETERIZATION (CUSTOM PROCEDURE TRAY) ×2 IMPLANT
TRANSDUCER W/STOPCOCK (MISCELLANEOUS) ×2 IMPLANT
TUBING CIL FLEX 10 FLL-RA (TUBING) ×2 IMPLANT
WIRE SAFE-T 1.5MM-J .035X260CM (WIRE) ×2 IMPLANT

## 2015-04-26 NOTE — Discharge Instructions (Signed)
Radial Site Care °Refer to this sheet in the next few weeks. These instructions provide you with information on caring for yourself after your procedure. Your caregiver may also give you more specific instructions. Your treatment has been planned according to current medical practices, but problems sometimes occur. Call your caregiver if you have any problems or questions after your procedure. °HOME CARE INSTRUCTIONS °· You may shower the day after the procedure. Remove the bandage (dressing) and gently wash the site with plain soap and water. Gently pat the site dry. °· Do not apply powder or lotion to the site. °· Do not submerge the affected site in water for 3 to 5 days. °· Inspect the site at least twice daily. °· Do not flex or bend the affected arm for 24 hours. °· No lifting over 5 pounds (2.3 kg) for 5 days after your procedure. °· Do not drive home if you are discharged the same day of the procedure. Have someone else drive you. °· You may drive 24 hours after the procedure unless otherwise instructed by your caregiver. °· Do not operate machinery or power tools for 24 hours. °· A responsible adult should be with you for the first 24 hours after you arrive home. °What to expect: °· Any bruising will usually fade within 1 to 2 weeks. °· Blood that collects in the tissue (hematoma) may be painful to the touch. It should usually decrease in size and tenderness within 1 to 2 weeks. °SEEK IMMEDIATE MEDICAL CARE IF: °· You have unusual pain at the radial site. °· You have redness, warmth, swelling, or pain at the radial site. °· You have drainage (other than a small amount of blood on the dressing). °· You have chills. °· You have a fever or persistent symptoms for more than 72 hours. °· You have a fever and your symptoms suddenly get worse. °· Your arm becomes pale, cool, tingly, or numb. °· You have heavy bleeding from the site. Hold pressure on the site. °Document Released: 01/02/2011 Document Revised:  02/22/2012 Document Reviewed: 01/02/2011 °ExitCare® Patient Information ©2015 [employer]. This information is not intended to replace advice given to you by your health care provider. Make sure you discuss any questions you have with your health care provider. ° °
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
## 2015-04-26 NOTE — Interval H&P Note (Signed)
Cath Lab Visit (complete for each Cath Lab visit)  Clinical Evaluation Leading to the Procedure:   ACS: No.  Non-ACS:    Anginal Classification: CCS III  Anti-ischemic medical therapy: Maximal Therapy (2 or more classes of medications)  Non-Invasive Test Results: No non-invasive testing performed  Prior CABG: No previous CABG      History and Physical Interval Note:  04/26/2015 9:20 AM  Arthur Church  has presented today for surgery, with the diagnosis of cHEST pain  The various methods of treatment have been discussed with the patient and family. After consideration of risks, benefits and other options for treatment, the patient has consented to  Procedure(s): Left Heart Cath and Coronary Angiography (N/A) as a surgical intervention .  The patient's history has been reviewed, patient examined, no change in status, stable for surgery.  I have reviewed the patient's chart and labs.  Questions were answered to the patient's satisfaction.     Arthur Church

## 2015-04-29 MED FILL — Lidocaine HCl Local Preservative Free (PF) Inj 1%: INTRAMUSCULAR | Qty: 30 | Status: AC

## 2015-04-29 MED FILL — Heparin Sodium (Porcine) 2 Unit/ML in Sodium Chloride 0.9%: INTRAMUSCULAR | Qty: 1000 | Status: AC

## 2015-04-30 ENCOUNTER — Telehealth: Payer: Self-pay | Admitting: Internal Medicine

## 2015-04-30 NOTE — Telephone Encounter (Signed)
Pt called in stating that our office was suppose to contact him for a Tykosyn appt in the hospital. Please f/u with the pt  Thanks

## 2015-04-30 NOTE — Telephone Encounter (Signed)
Patient notified that Kennon Rounds, PharmD will contact him on 5/18 to set up admission for Tikosyn - after speaking with Kennon Rounds this was the info received. He voiced understanding

## 2015-05-01 ENCOUNTER — Other Ambulatory Visit: Payer: Self-pay | Admitting: *Deleted

## 2015-05-01 ENCOUNTER — Telehealth: Payer: Self-pay | Admitting: Internal Medicine

## 2015-05-01 DIAGNOSIS — I4891 Unspecified atrial fibrillation: Secondary | ICD-10-CM

## 2015-05-01 NOTE — Telephone Encounter (Signed)
Will need to call patient and give him option of doing TEE - to assess for (potential) thrombus that could have formed while off anticoagulation for LHC, or wait for 3 weeks

## 2015-05-01 NOTE — Telephone Encounter (Signed)
Patient is scheduled to TEE 05/02/15 with Dr. Jens Som. Procedure is around 9am. Patient given instructions of what time to arrive at [facility] short stay, what time procedure is, NPO after midnight, can take meds with small sip of water.   Will defer to Kennon Rounds to assist with arranging for bed/admssion for [facility]
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
## 2015-05-02 ENCOUNTER — Ambulatory Visit (HOSPITAL_COMMUNITY): Payer: 59

## 2015-05-02 ENCOUNTER — Encounter (HOSPITAL_COMMUNITY): Payer: Self-pay

## 2015-05-02 ENCOUNTER — Encounter (HOSPITAL_COMMUNITY)
Admission: RE | Disposition: A | Discharge status: Home / Self Care | Payer: Self-pay | Source: Ambulatory Visit | Attending: Cardiology

## 2015-05-02 ENCOUNTER — Ambulatory Visit (HOSPITAL_COMMUNITY)
Admission: RE | Admit: 2015-05-02 | Discharge: 2015-05-02 | Disposition: A | Discharge status: Home / Self Care | Payer: 59 | Source: Ambulatory Visit | Attending: Cardiology | Admitting: Cardiology

## 2015-05-02 DIAGNOSIS — I5031 Acute diastolic (congestive) heart failure: Secondary | ICD-10-CM | POA: Insufficient documentation

## 2015-05-02 DIAGNOSIS — Z87891 Personal history of nicotine dependence: Secondary | ICD-10-CM | POA: Insufficient documentation

## 2015-05-02 DIAGNOSIS — Z6841 Body Mass Index (BMI) 40.0 and over, adult: Secondary | ICD-10-CM | POA: Insufficient documentation

## 2015-05-02 DIAGNOSIS — F1010 Alcohol abuse, uncomplicated: Secondary | ICD-10-CM | POA: Insufficient documentation

## 2015-05-02 DIAGNOSIS — I4892 Unspecified atrial flutter: Secondary | ICD-10-CM

## 2015-05-02 DIAGNOSIS — I252 Old myocardial infarction: Secondary | ICD-10-CM

## 2015-05-02 DIAGNOSIS — I340 Nonrheumatic mitral (valve) insufficiency: Secondary | ICD-10-CM | POA: Diagnosis not present

## 2015-05-02 DIAGNOSIS — I4891 Unspecified atrial fibrillation: Secondary | ICD-10-CM | POA: Insufficient documentation

## 2015-05-02 HISTORY — PX: TEE WITHOUT CARDIOVERSION: SHX5443

## 2015-05-02 SURGERY — ECHOCARDIOGRAM, TRANSESOPHAGEAL
Anesthesia: Moderate Sedation

## 2015-05-02 MED ORDER — FENTANYL CITRATE (PF) 100 MCG/2ML IJ SOLN
INTRAMUSCULAR | Status: DC | PRN
  Administered 2015-05-02 (×2): 25 ug via INTRAVENOUS

## 2015-05-02 MED ORDER — MIDAZOLAM HCL 10 MG/2ML IJ SOLN
INTRAMUSCULAR | Status: DC | PRN
  Administered 2015-05-02 (×2): 2 mg via INTRAVENOUS

## 2015-05-02 MED ORDER — DIPHENHYDRAMINE HCL 50 MG/ML IJ SOLN
INTRAMUSCULAR | Status: AC
  Filled 2015-05-02: qty 1

## 2015-05-02 MED ORDER — SODIUM CHLORIDE 0.9 % IV SOLN
INTRAVENOUS | Status: DC
  Administered 2015-05-02: 500 mL via INTRAVENOUS

## 2015-05-02 MED ORDER — BUTAMBEN-TETRACAINE-BENZOCAINE 2-2-14 % EX AERO
INHALATION_SPRAY | CUTANEOUS | Status: DC | PRN
  Administered 2015-05-02: 2 via TOPICAL

## 2015-05-02 MED ORDER — MIDAZOLAM HCL 5 MG/ML IJ SOLN
INTRAMUSCULAR | Status: AC
Start: 2015-05-02 — End: 2015-05-02
  Filled 2015-05-02: qty 2

## 2015-05-02 MED ORDER — FENTANYL CITRATE (PF) 100 MCG/2ML IJ SOLN
INTRAMUSCULAR | Status: AC
  Filled 2015-05-02: qty 2

## 2015-05-02 NOTE — Interval H&P Note (Signed)
History and Physical Interval Note:  05/02/2015 9:08 AM  Arthur Church  has presented today for surgery, with the diagnosis of AFIB  The various methods of treatment have been discussed with the patient and family. After consideration of risks, benefits and other options for treatment, the patient has consented to  Procedure(s): TRANSESOPHAGEAL ECHOCARDIOGRAM (TEE) (N/A) as a surgical intervention .  The patient's history has been reviewed, patient examined, no change in status, stable for surgery.  I have reviewed the patient's chart and labs.  Questions were answered to the patient's satisfaction.     Olga Millers

## 2015-05-02 NOTE — Telephone Encounter (Signed)
Spoke with pt.  He will come to the [address] office on 5/20 at 9:15 for Tikosyn admission.
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
## 2015-05-02 NOTE — Discharge Instructions (Signed)
Conscious Sedation, Adult, Care After °Refer to this sheet in the next few weeks. These instructions provide you with information on caring for yourself after your procedure. Your health care provider may also give you more specific instructions. Your treatment has been planned according to current medical practices, but problems sometimes occur. Call your health care provider if you have any problems or questions after your procedure. °WHAT TO EXPECT AFTER THE PROCEDURE  °After your procedure: °· You may feel sleepy, clumsy, and have poor balance for several hours. °· Vomiting may occur if you eat too soon after the procedure. °HOME CARE INSTRUCTIONS °· Do not participate in any activities where you could become injured for at least 24 hours. Do not: °¨ Drive. °¨ Swim. °¨ Ride a bicycle. °¨ Operate heavy machinery. °¨ Cook. °¨ Use power tools. °¨ Climb ladders. °¨ Work from a high place. °· Do not make important decisions or sign legal documents until you are improved. °· If you vomit, drink water, juice, or soup when you can drink without vomiting. Make sure you have little or no nausea before eating solid foods. °· Only take over-the-counter or prescription medicines for pain, discomfort, or fever as directed by your health care provider. °· Make sure you and your family fully understand everything about the medicines given to you, including what side effects may occur. °· You should not drink alcohol, take sleeping pills, or take medicines that cause drowsiness for at least 24 hours. °· If you smoke, do not smoke without supervision. °· If you are feeling better, you may resume normal activities 24 hours after you were sedated. °· Keep all appointments with your health care provider. °SEEK MEDICAL CARE IF: °· Your skin is pale or bluish in color. °· You continue to feel nauseous or vomit. °· Your pain is getting worse and is not helped by medicine. °· You have bleeding or swelling. °· You are still sleepy or  feeling clumsy after 24 hours. °SEEK IMMEDIATE MEDICAL CARE IF: °· You develop a rash. °· You have difficulty breathing. °· You develop any type of allergic problem. °· You have a fever. °MAKE SURE YOU: °· Understand these instructions. °· Will watch your condition. °· Will get help right away if you are not doing well or get worse. °Document Released: 09/20/2013 Document Reviewed: 09/20/2013 °ExitCare® Patient Information ©2015 [employer]. This information is not intended to replace advice given to you by your health care provider. Make sure you discuss any questions you have with your health care provider. °  °

## 2015-05-02 NOTE — Progress Notes (Signed)
Echocardiogram Echocardiogram Transesophageal has been performed.  Dorothey Baseman 05/02/2015, 9:54 AM

## 2015-05-02 NOTE — CV Procedure (Signed)
See full TEE report in [employer]; no LAA thrombus. Olga Millers

## 2015-05-03 ENCOUNTER — Ambulatory Visit (INDEPENDENT_AMBULATORY_CARE_PROVIDER_SITE_OTHER): Payer: 59 | Admitting: Pharmacist

## 2015-05-03 ENCOUNTER — Institutional Professional Consult (permissible substitution): Payer: 59 | Admitting: Internal Medicine

## 2015-05-03 ENCOUNTER — Inpatient Hospital Stay (HOSPITAL_COMMUNITY)
Admission: AD | Admit: 2015-05-03 | Discharge: 2015-05-06 | DRG: 309 | Disposition: A | Discharge status: Home / Self Care | Payer: 59 | Source: Ambulatory Visit | Attending: Internal Medicine | Admitting: Internal Medicine

## 2015-05-03 ENCOUNTER — Telehealth: Payer: Self-pay | Admitting: *Deleted

## 2015-05-03 ENCOUNTER — Encounter (HOSPITAL_COMMUNITY): Payer: Self-pay | Admitting: Cardiology

## 2015-05-03 DIAGNOSIS — Z8249 Family history of ischemic heart disease and other diseases of the circulatory system: Secondary | ICD-10-CM

## 2015-05-03 DIAGNOSIS — F1010 Alcohol abuse, uncomplicated: Secondary | ICD-10-CM | POA: Diagnosis present

## 2015-05-03 DIAGNOSIS — Z23 Encounter for immunization: Secondary | ICD-10-CM

## 2015-05-03 DIAGNOSIS — I10 Essential (primary) hypertension: Secondary | ICD-10-CM | POA: Diagnosis present

## 2015-05-03 DIAGNOSIS — I2510 Atherosclerotic heart disease of native coronary artery without angina pectoris: Secondary | ICD-10-CM | POA: Diagnosis present

## 2015-05-03 DIAGNOSIS — Z87891 Personal history of nicotine dependence: Secondary | ICD-10-CM

## 2015-05-03 DIAGNOSIS — I429 Cardiomyopathy, unspecified: Secondary | ICD-10-CM | POA: Diagnosis not present

## 2015-05-03 DIAGNOSIS — I483 Typical atrial flutter: Secondary | ICD-10-CM

## 2015-05-03 DIAGNOSIS — I481 Persistent atrial fibrillation: Principal | ICD-10-CM | POA: Diagnosis present

## 2015-05-03 DIAGNOSIS — I252 Old myocardial infarction: Secondary | ICD-10-CM

## 2015-05-03 DIAGNOSIS — R7309 Other abnormal glucose: Secondary | ICD-10-CM | POA: Diagnosis present

## 2015-05-03 DIAGNOSIS — I480 Paroxysmal atrial fibrillation: Secondary | ICD-10-CM | POA: Diagnosis present

## 2015-05-03 DIAGNOSIS — Z7902 Long term (current) use of antithrombotics/antiplatelets: Secondary | ICD-10-CM

## 2015-05-03 DIAGNOSIS — Z5181 Encounter for therapeutic drug level monitoring: Secondary | ICD-10-CM

## 2015-05-03 DIAGNOSIS — I484 Atypical atrial flutter: Secondary | ICD-10-CM

## 2015-05-03 DIAGNOSIS — I5022 Chronic systolic (congestive) heart failure: Secondary | ICD-10-CM | POA: Diagnosis present

## 2015-05-03 DIAGNOSIS — Z79899 Other long term (current) drug therapy: Secondary | ICD-10-CM | POA: Diagnosis not present

## 2015-05-03 DIAGNOSIS — I428 Other cardiomyopathies: Secondary | ICD-10-CM | POA: Diagnosis present

## 2015-05-03 DIAGNOSIS — Z7901 Long term (current) use of anticoagulants: Secondary | ICD-10-CM

## 2015-05-03 DIAGNOSIS — Z6841 Body Mass Index (BMI) 40.0 and over, adult: Secondary | ICD-10-CM | POA: Diagnosis not present

## 2015-05-03 HISTORY — DX: Atherosclerotic heart disease of native coronary artery without angina pectoris: I25.10

## 2015-05-03 HISTORY — DX: Other persistent atrial fibrillation: I48.19

## 2015-05-03 LAB — BASIC METABOLIC PANEL
ANION GAP: 8 (ref 5–15)
BUN: 17 mg/dL (ref 6–23)
BUN: 16 mg/dL (ref 6–20)
CALCIUM: 9.6 mg/dL (ref 8.4–10.5)
CALCIUM: 9.4 mg/dL (ref 8.9–10.3)
CHLORIDE: 97 mmol/L — AB (ref 101–111)
CO2: 34 meq/L — AB (ref 19–32)
CO2: 33 mmol/L — ABNORMAL HIGH (ref 22–32)
CREATININE: 0.86 mg/dL (ref 0.40–1.50)
CREATININE: 0.93 mg/dL (ref 0.61–1.24)
Chloride: 100 mEq/L (ref 96–112)
GFR calc Af Amer: >60 mL/min (ref >60)
GFR: 97.16 mL/min (ref >60.00)
Glucose, Bld: 108 mg/dL — ABNORMAL HIGH (ref 70–99)
Glucose, Bld: 131 mg/dL — ABNORMAL HIGH (ref 65–99)
Potassium: 3.9 mEq/L (ref 3.5–5.1)
Potassium: 4.7 mmol/L (ref 3.5–5.1)
Sodium: 138 mEq/L (ref 135–145)
Sodium: 138 mmol/L (ref 135–145)

## 2015-05-03 LAB — MAGNESIUM
Magnesium: 1.7 mg/dL (ref 1.5–2.5)
Magnesium: 2 mg/dL (ref 1.7–2.4)

## 2015-05-03 MED ORDER — FUROSEMIDE 40 MG PO TABS
40.0000 mg | ORAL_TABLET | Freq: Two times a day (BID) | ORAL | Status: DC
  Administered 2015-05-03 – 2015-05-06 (×6): 40 mg via ORAL
  Filled 2015-05-03 (×8): qty 1

## 2015-05-03 MED ORDER — DOFETILIDE 500 MCG PO CAPS
500.0000 ug | ORAL_CAPSULE | Freq: Two times a day (BID) | ORAL | Status: DC
  Administered 2015-05-03 – 2015-05-06 (×6): 500 ug via ORAL
  Filled 2015-05-03 (×9): qty 1

## 2015-05-03 MED ORDER — SODIUM CHLORIDE 0.9 % IJ SOLN: 3.0000 mL | INTRAMUSCULAR | Status: DC | PRN

## 2015-05-03 MED ORDER — SODIUM CHLORIDE 0.9 % IV SOLN: 250.0000 mL | INTRAVENOUS | Status: DC | PRN

## 2015-05-03 MED ORDER — LORATADINE 10 MG PO TABS
10.0000 mg | ORAL_TABLET | Freq: Every day | ORAL | Status: DC
  Administered 2015-05-04 – 2015-05-06 (×3): 10 mg via ORAL
  Filled 2015-05-03 (×4): qty 1

## 2015-05-03 MED ORDER — ACETAMINOPHEN 500 MG PO TABS: 500.0000 mg | ORAL_TABLET | Freq: Four times a day (QID) | ORAL | Status: DC | PRN

## 2015-05-03 MED ORDER — APIXABAN 5 MG PO TABS
5.0000 mg | ORAL_TABLET | Freq: Two times a day (BID) | ORAL | Status: DC
  Administered 2015-05-03 – 2015-05-06 (×6): 5 mg via ORAL
  Filled 2015-05-03 (×7): qty 1

## 2015-05-03 MED ORDER — DOFETILIDE 500 MCG PO CAPS
500.0000 ug | ORAL_CAPSULE | Freq: Two times a day (BID) | ORAL | Status: DC
  Filled 2015-05-03 (×2): qty 1

## 2015-05-03 MED ORDER — SODIUM CHLORIDE 0.9 % IJ SOLN
3.0000 mL | Freq: Two times a day (BID) | INTRAMUSCULAR | Status: DC
  Administered 2015-05-03 – 2015-05-06 (×4): 3 mL via INTRAVENOUS

## 2015-05-03 MED ORDER — LISINOPRIL 5 MG PO TABS
5.0000 mg | ORAL_TABLET | Freq: Every day | ORAL | Status: DC
  Administered 2015-05-03 – 2015-05-06 (×4): 5 mg via ORAL
  Filled 2015-05-03 (×4): qty 1

## 2015-05-03 MED ORDER — MAGNESIUM OXIDE 400 (241.3 MG) MG PO TABS
400.0000 mg | ORAL_TABLET | Freq: Once | ORAL | Status: AC
  Administered 2015-05-03: 400 mg via ORAL
  Filled 2015-05-03: qty 1

## 2015-05-03 MED ORDER — ATORVASTATIN CALCIUM 80 MG PO TABS
80.0000 mg | ORAL_TABLET | Freq: Every day | ORAL | Status: DC
  Administered 2015-05-03 – 2015-05-05 (×3): 80 mg via ORAL
  Filled 2015-05-03 (×4): qty 1

## 2015-05-03 MED ORDER — POTASSIUM CHLORIDE CRYS ER 20 MEQ PO TBCR
20.0000 meq | EXTENDED_RELEASE_TABLET | Freq: Once | ORAL | Status: AC
  Administered 2015-05-03: 20 meq via ORAL
  Filled 2015-05-03: qty 1

## 2015-05-03 MED ORDER — CARVEDILOL 6.25 MG PO TABS
18.7500 mg | ORAL_TABLET | Freq: Two times a day (BID) | ORAL | Status: DC
  Administered 2015-05-03 – 2015-05-06 (×6): 18.75 mg via ORAL
  Filled 2015-05-03 (×9): qty 1

## 2015-05-03 MED ORDER — TRIAMCINOLONE ACETONIDE 0.1 % EX CREA
1.0000 "application " | TOPICAL_CREAM | Freq: Two times a day (BID) | CUTANEOUS | Status: DC | PRN
  Filled 2015-05-03: qty 15

## 2015-05-03 MED ORDER — HYDROCODONE-ACETAMINOPHEN 5-325 MG PO TABS: 1.0000 | ORAL_TABLET | Freq: Four times a day (QID) | ORAL | Status: DC | PRN

## 2015-05-03 NOTE — Progress Notes (Signed)
Admit Complaint: 59 y.o. male admitted 05/03/2015 with atrial fibrillation to be initiated on dofetilide.   Assessment:  Patient Exclusion Criteria: If any screening criteria checked as "Yes", then  patient  should NOT receive dofetilide until criteria item is corrected. If "Yes" please indicate correction plan.  YES  NO Patient  Exclusion Criteria Correction Plan  []  [x]  Baseline QTc interval is greater than or equal to 440 msec. IF above YES box checked dofetilide contraindicated unless patient has ICD; then may proceed if QTc 500-550 msec or with known ventricular conduction abnormalities may proceed with QTc 550-600 msec. QTc =   422  [x]  []  Magnesium level is less than 1.8 mEq/l : Last magnesium:  Lab Results  Component Value Date   MG 1.7 05/03/2015       Replaced x1  [x]  []  Potassium level is less than 4 mEq/l : Last potassium:  Lab Results  Component Value Date   K 3.9 05/03/2015       Replaced x1  []  [x]  Patient is known or suspected to have a digoxin level greater than 2 ng/ml: No results found for: DIGOXIN    []  [x]  Creatinine clearance less than 20 ml/min (calculated using Cockcroft-Gault, actual body weight and serum creatinine): Estimated Creatinine Clearance: 146.1 mL/min (by C-G formula based on Cr of 0.86).    []  [x]  Patient has received drugs known to prolong the QT intervals within the last 48 hours (phenothiazines, tricyclics or tetracyclic antidepressants, erythromycin, H-1 antihistamines, cisapride, fluoroquinolones, azithromycin). Drugs not listed above may have an, as yet, undetected potential to prolong the QT interval, updated information on QT prolonging agents is available at this website:QT prolonging agents   []  [x]  Patient received a dose of hydrochlorothiazide (Oretic) alone or in any combination including triamterene (Dyazide, Maxzide) in the last 48 hours.   []  [x]  Patient received a medication known to increase dofetilide plasma concentrations prior  to initial dofetilide dose:  . Trimethoprim (Primsol, Proloprim) in the last 36 hours . Verapamil (Calan, Verelan) in the last 36 hours or a sustained release dose in the last 72 hours . Megestrol (Megace) in the last 5 days  . Cimetidine (Tagamet) in the last 6 hours . Ketoconazole (Nizoral) in the last 24 hours . Itraconazole (Sporanox) in the last 48 hours  . Prochlorperazine (Compazine) in the last 36 hours    []  [x]  Patient is known to have a history of torsades de pointes; congenital or acquired long QT syndromes.   []  [x]  Patient has received a Class 1 antiarrhythmic with less than 2 half-lives since last dose. (Disopyramide, Quinidine, Procainamide, Lidocaine, Mexiletine, Flecainide, Propafenone)   []  [x]  Patient has received amiodarone therapy in the past 3 months or amiodarone level is greater than 0.3 ng/ml.    Patient has been appropriately anticoagulated with apixban.  Ordering provider was confirmed at [email or web address] if they are not listed on the [facility] Authorized Prescribers list.  Goal of Therapy: Follow renal function, electrolytes, potential drug interactions, and dose adjustment. Provide education and 1 week supply at discharge.  Plan:  [x]   Physician selected initial dose within range recommended for patients level of renal function - will monitor for response.  []   Physician selected initial dose outside of range recommended for patients level of renal function - will discuss if the dose should be altered at this time.   Select One Calculated CrCl  Dose q12h  [x]  > 60 ml/min 500 mcg  []  40-60 ml/min 250 mcg  []   20-40 ml/min 125 mcg   2. Follow up QTc after the first 5 doses, renal function, electrolytes (K & Mg) daily x 3 days, dose adjustment, success of initiation and facilitate 1 week discharge supply as clinically indicated.  3. Initiate Tikosyn education video (Call [phone] and ask for video # 116).  4. Place Enrollment Form on the chart for discharge  supply of dofetilide.   Serjio Deupree, Tsz-Yin 5:03 PM 05/03/2015

## 2015-05-03 NOTE — Progress Notes (Signed)
     Chief Complaint: Atrial fibrillation   HPI Arthur Church is a 59 y.o. male pt of Dr. Rennis Golden seen today for Tikosyn initiation.  He was seen in consultation by [name] on 4/22 for a history of afib/flutter.  He went to the ER in March with congestive heart failure and asymptomatic rapid atrial flutter and was found to be in florid congestive failure. 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 Left atrial dimension was 44/1.5??/30.  He was considered high risk for sudden death and a LifeVest was placed. Nonsustained ventricular tachycardia had been noted. He was cardioverted on 03/06/15 but did revert to atrial fibrillation.  [name] discussed options with patient including AAD therapy.  Plan was to proceed with Tikosyn at that time.    On 5/13, pt underwent cardiac catheterization to look for ischemic causes of LV dysfunction.  The cath showed 70% stenosis of the mid LAD and EF of 20%.  Medical management was suggested at that time.    Pt called office on 5/17 to set up Tikosyn admission.  Since he had interrupted his anticoagulation for his procedure on 5/13, he underwent TEE on 5/19.  There was no evidence of atrial thrombus so okay to proceed with chemical cardioversion.  Pt is accompanied with his wife today.  He was educated on potential side effects of Tikosyn including QTc prolongation.  He continues to wear his lifevest.  He is aware of the importance of compliance and will call the office if he misses more than 2 doses in a row.    Reviewed pt's medication list.  He is currently not taking any QTc prolongating or contraindicated medication.  He is anticoagulated with Eliquis and had TEE yesterday. He was given the $4 copay card to use with his prescription.    EKG reviewed by [name] showed atrial fibrillation with vent rate of 81 bpm.  QTc 422 msec.      Home Meds: Prior to  Admission medications   Medication Sig Start Date End Date Taking? Authorizing Provider  apixaban (ELIQUIS) 5 MG TABS tablet Take 1 tablet (5 mg total) by mouth 2 (two) times daily. 03/10/15  Yes Janetta Hora, PA-C  atorvastatin (LIPITOR) 80 MG tablet Take 1 tablet (80 mg total) by mouth daily at 6 PM. 03/10/15  Yes Janetta Hora, PA-C  carvedilol (COREG) 12.5 MG tablet Take 1.5 tablets (18.75 mg total) by mouth 2 (two) times daily with a meal. 03/22/15  Yes Rosalio Macadamia, NP  furosemide (LASIX) 40 MG tablet Take 1 tablet (40 mg total) by mouth 2 (two) times daily. 03/10/15  Yes Janetta Hora, PA-C  lisinopril (PRINIVIL) 10 MG tablet Take 0.5 tablets (5 mg total) by mouth daily. 03/10/15  Yes Janetta Hora, PA-C  triamcinolone cream (KENALOG) 0.1 % Apply to affected areas twice a day for up to two weeks, avoid face. 04/03/15 04/02/16 Yes Sean Hommel, DO    I  Allergies: No Known Allergies   Assessment and Plan:   Atrial fibrillation/flutter: Pt's K and Mg slightly low for starting Tikosyn but given that he underwent TEE yesterday, will go ahead and admit to the hospital for replacement prior to starting Tikosyn tonight.  Pt is aware to report to hospital for admission.     Sarita Hakanson R
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
## 2015-05-03 NOTE — Telephone Encounter (Signed)
Referral to [facility]

## 2015-05-04 LAB — BASIC METABOLIC PANEL
ANION GAP: 10 (ref 5–15)
BUN: 15 mg/dL (ref 6–20)
CHLORIDE: 101 mmol/L (ref 101–111)
CO2: 26 mmol/L (ref 22–32)
Calcium: 8.8 mg/dL — ABNORMAL LOW (ref 8.9–10.3)
Creatinine, Ser: 0.78 mg/dL (ref 0.61–1.24)
GFR calc non Af Amer: >60 mL/min (ref >60)
Glucose, Bld: 107 mg/dL — ABNORMAL HIGH (ref 65–99)
Potassium: 4.2 mmol/L (ref 3.5–5.1)
SODIUM: 137 mmol/L (ref 135–145)

## 2015-05-04 LAB — MAGNESIUM: Magnesium: 1.8 mg/dL (ref 1.7–2.4)

## 2015-05-04 NOTE — Progress Notes (Signed)
   SUBJECTIVE: The patient is doing well today.  At this time, he denies chest pain, shortness of breath, or any new concerns.  Converted to sinus overnight.  Marland Kitchen apixaban  5 mg Oral BID  . atorvastatin  80 mg Oral q1800  . carvedilol  18.75 mg Oral BID WC  . dofetilide  500 mcg Oral BID  . furosemide  40 mg Oral BID  . lisinopril  5 mg Oral Daily  . loratadine  10 mg Oral Daily  . sodium chloride  3 mL Intravenous Q12H      OBJECTIVE: Physical Exam: Filed Vitals:   05/03/15 1539 05/03/15 2035 05/04/15 0612  BP: 119/76 121/78 126/73  Pulse: 92 79 72  Temp: 98.7 F (37.1 C) 97.8 F (36.6 C) 97.7 F (36.5 C)  TempSrc: Oral Oral Oral  Resp: 17 18 18   SpO2: 94% 93% 95%   No intake or output data in the 24 hours ending 05/04/15 0836  Telemetry reveals afib--> sinus rhythm overnight, frequent PACs, no ventricular arrhythmias, QT is stable  GEN- The patient is overweight appearing, alert and oriented x 3 today.   Head- normocephalic, atraumatic Eyes-  Sclera clear, conjunctiva pink Ears- hearing intact Oropharynx- clear Neck- supple,   Lungs- Clear to ausculation bilaterally, normal work of breathing Heart- Regular rate and rhythm  GI- soft, NT, ND, + BS Extremities- no clubbing, cyanosis, or edema Skin- no rash or lesion Psych- euthymic mood, full affect Neuro- strength and sensation are intact  LABS: Basic Metabolic Panel:  Recent Labs  79/89/21 1942 05/04/15 0400  NA 138 137  K 4.7 4.2  CL 97* 101  CO2 33* 26  GLUCOSE 131* 107*  BUN 16 15  CREATININE 0.93 0.78  CALCIUM 9.4 8.8*  MG 2.0 1.8    ASSESSMENT AND PLAN:  Principal Problem:   Persistent atrial fibrillation Active Problems:   Morbid obesity   Non-ischemic cardiomyopathy   Assessment/Plan: 1. Persistent atrial fibrillation/atrial flutter Now in sinus with Tikosyn loading. QT is stable Will need significant lifestyle modification for long-term success. He does not snore and does not use  alcohol.  Keep K >3.9, Mg >1.8 Continue Eliquis for CHADS2VASC score of 2   2. HTN Stable No change required today  3. Morbid obesity Weight loss encouraged Will refer patient to AF clinic nutrition program and may need to consider bariatric surgery He previously lost 130 pounds with the [employer] (was >400 at that time)  4. 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 He does not wish to continue wearing LifeVest. Discussion with patient about data available on the [facility] patient population - he and his wife will contemplate if they would like to continue LifeVest at discharge. Will need re-evaluation of EF in 3 months post restoration of SR - if EF remains depressed, will need to be considered for primary prevention ICD.   5. CAD 70% LAD lesion at cath earlier this month Continue BB/statin  Hillis Range, MD 05/04/2015 8:36 AM

## 2015-05-05 LAB — BASIC METABOLIC PANEL
ANION GAP: 8 (ref 5–15)
BUN: 11 mg/dL (ref 6–20)
CALCIUM: 8.9 mg/dL (ref 8.9–10.3)
CHLORIDE: 98 mmol/L — AB (ref 101–111)
CO2: 32 mmol/L (ref 22–32)
Creatinine, Ser: 0.86 mg/dL (ref 0.61–1.24)
GFR calc Af Amer: >60 mL/min (ref >60)
Glucose, Bld: 122 mg/dL — ABNORMAL HIGH (ref 65–99)
Potassium: 4.2 mmol/L (ref 3.5–5.1)
Sodium: 138 mmol/L (ref 135–145)

## 2015-05-05 LAB — MAGNESIUM: Magnesium: 2 mg/dL (ref 1.7–2.4)

## 2015-05-05 NOTE — Progress Notes (Signed)
   SUBJECTIVE: The patient is doing well today.  At this time, he denies chest pain, shortness of breath, or any new concerns.  Remains in sinus with PACs.  Marland Kitchen apixaban  5 mg Oral BID  . atorvastatin  80 mg Oral q1800  . carvedilol  18.75 mg Oral BID WC  . dofetilide  500 mcg Oral BID  . furosemide  40 mg Oral BID  . lisinopril  5 mg Oral Daily  . loratadine  10 mg Oral Daily  . sodium chloride  3 mL Intravenous Q12H      OBJECTIVE: Physical Exam: Filed Vitals:   05/04/15 1343 05/04/15 2019 05/05/15 0440 05/05/15 0643  BP: 115/63 119/85 95/61 122/70  Pulse: 78 65 53 64  Temp: 97.9 F (36.6 C) 98 F (36.7 C) 98.1 F (36.7 C)   TempSrc: Oral Oral Oral   Resp: 17 18 18    SpO2: 97% 96% 92%     Intake/Output Summary (Last 24 hours) at 05/05/15 6222 Last data filed at 05/04/15 1343  Gross per 24 hour  Intake    600 ml  Output      0 ml  Net    600 ml    Telemetry reveals sinus rhythm , frequent PACs, no ventricular arrhythmias, QT is stable  GEN- The patient is overweight appearing, alert and oriented x 3 today.   Head- normocephalic, atraumatic Eyes-  Sclera clear, conjunctiva pink Ears- hearing intact Oropharynx- clear Neck- supple,   Lungs- Clear to ausculation bilaterally, normal work of breathing Heart- Regular rate and rhythm  GI- soft, NT, ND, + BS Extremities- no clubbing, cyanosis, or edema Skin- no rash or lesion Psych- euthymic mood, full affect Neuro- strength and sensation are intact  LABS: Basic Metabolic Panel:  Recent Labs  97/98/92 0400 05/05/15 0520  NA 137 138  K 4.2 4.2  CL 101 98*  CO2 26 32  GLUCOSE 107* 122*  BUN 15 11  CREATININE 0.78 0.86  CALCIUM 8.8* 8.9  MG 1.8 2.0    ASSESSMENT AND PLAN:  Principal Problem:   Persistent atrial fibrillation Active Problems:   Morbid obesity   Non-ischemic cardiomyopathy   Assessment/Plan: 1. Persistent atrial fibrillation/atrial flutter Now in sinus with Tikosyn loading. QT is  stable Will need significant lifestyle modification for long-term success. He does not snore and does not use alcohol.  Keep K >3.9, Mg >1.8 Continue Eliquis for CHADS2VASC score of 2   2. HTN Stable No change required today  3. Morbid obesity Weight loss encouraged Will refer patient to AF clinic nutrition program and may need to consider bariatric surgery He previously lost 130 pounds with the [employer] (was >400 at that time)  4. 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 He does not wish to continue wearing LifeVest. Discussion with patient about data available on the [facility] patient population - he and his wife will contemplate if they would like to continue LifeVest at discharge. Will need re-evaluation of EF in 3 months post restoration of SR - if EF remains depressed, will need to be considered for primary prevention ICD.   5. CAD 70% LAD lesion at cath earlier this month Continue BB/statin  Anticipate discharge to home in am Bmet,mg, ekg in 1 week Follow-up with Dr Graciela Husbands in 4 weeks  Hillis Range, MD 05/05/2015 8:07 AM

## 2015-05-05 NOTE — Discharge Instructions (Addendum)
Information on my medicine - ELIQUIS (apixaban)  This medication education was reviewed with me or my healthcare representative as part of my discharge preparation.    Why was Eliquis prescribed for you? Eliquis was prescribed for you to reduce the risk of a blood clot forming that can cause a stroke if you have a medical condition called atrial fibrillation (a type of irregular heartbeat).  What do You need to know about Eliquis ? Take your Eliquis 5mg  TWICE DAILY - one tablet in the morning and one tablet in the evening with or without food. If you have difficulty swallowing the tablet whole please discuss with your pharmacist how to take the medication safely.  Take Eliquis exactly as prescribed by your doctor and DO NOT stop taking Eliquis without talking to the doctor who prescribed the medication.  Stopping may increase your risk of developing a stroke.  Refill your prescription before you run out.  After discharge, you should have regular check-up appointments with your healthcare provider that is prescribing your Eliquis.  In the future your dose may need to be changed if your kidney function or weight changes by a significant amount or as you get older.  What do you do if you miss a dose? If you miss a dose, take it as soon as you remember on the same day and resume taking twice daily.  Do not take more than one dose of ELIQUIS at the same time to make up a missed dose.  Important Safety Information A possible side effect of Eliquis is bleeding. You should call your healthcare provider right away if you experience any of the following: ? Bleeding from an injury or your nose that does not stop. ? Unusual colored urine (red or dark brown) or unusual colored stools (red or black). ? Unusual bruising for unknown reasons. ? A serious fall or if you hit your head (even if there is no bleeding).  Some medicines may interact with Eliquis and might increase your risk of bleeding or  clotting while on Eliquis. To help avoid this, consult your healthcare provider or pharmacist prior to using any new prescription or non-prescription medications, including herbals, vitamins, non-steroidal anti-inflammatory drugs (NSAIDs) and supplements.  This website has more information on Eliquis (apixaban): http://www.eliquis.com/eliquis/home  Do not miss your eliquis or Tikosyn.  Keep appt on the 31st and have lab work along with EKG done that day.  Heart Healthy diet.  Weigh daily Call [phone] if weight climbs more than 3 pounds in a day or 5 pounds in a week. No salt to very little salt in your diet.  No more than 2000 mg in a day. Call if increased shortness of breath or increased swelling.

## 2015-05-06 ENCOUNTER — Other Ambulatory Visit: Payer: Self-pay | Admitting: Cardiology

## 2015-05-06 DIAGNOSIS — Z5181 Encounter for therapeutic drug level monitoring: Secondary | ICD-10-CM

## 2015-05-06 DIAGNOSIS — I480 Paroxysmal atrial fibrillation: Secondary | ICD-10-CM

## 2015-05-06 DIAGNOSIS — Z79899 Other long term (current) drug therapy: Secondary | ICD-10-CM

## 2015-05-06 DIAGNOSIS — Z7901 Long term (current) use of anticoagulants: Secondary | ICD-10-CM

## 2015-05-06 LAB — BASIC METABOLIC PANEL
Anion gap: 8 (ref 5–15)
BUN: 12 mg/dL (ref 6–20)
CALCIUM: 8.8 mg/dL — AB (ref 8.9–10.3)
CO2: 31 mmol/L (ref 22–32)
Chloride: 98 mmol/L — ABNORMAL LOW (ref 101–111)
Creatinine, Ser: 0.8 mg/dL (ref 0.61–1.24)
Glucose, Bld: 114 mg/dL — ABNORMAL HIGH (ref 65–99)
POTASSIUM: 4.2 mmol/L (ref 3.5–5.1)
SODIUM: 137 mmol/L (ref 135–145)

## 2015-05-06 LAB — MAGNESIUM: MAGNESIUM: 1.9 mg/dL (ref 1.7–2.4)

## 2015-05-06 MED ORDER — DOFETILIDE 500 MCG PO CAPS: 500.0000 ug | ORAL_CAPSULE | Freq: Two times a day (BID) | ORAL | Status: DC

## 2015-05-06 NOTE — Progress Notes (Signed)
59 y.o. male with a past medical history significant for morbid obesity, pre-diabetes, and hypertension. He presented to the hospital in 02/2015 in typical atrial flutter and heart failure and underwent TEE/cardioversion. EF at that time was found to be 20-25%. He was placed on medical therapy and subsequently developed rate controlled atrial fibrillation. It was felt that his cardiomyopathy was tachycardia mediated. A lifevest was placed prior to discharge. He was seen recently by [name] who recommended cardiac catheterization which demonstrated 70% mid LAD stenosis with an EF of 20%. He was placed back on anticoagulation and underwent TEE yesterday with plans for Tikosyn admission today. TEE yesterday demonstrated EF 20-25%, diffuse hypokinesis, moderately dilated LA.   Subjective: No complaints  Objective: Vital signs in last 24 hours: Temp:  [97.9 F (36.6 C)-98.2 F (36.8 C)] 97.9 F (36.6 C) (05/23 0458) Pulse Rate:  [41-110] 66 (05/23 0656) Resp:  [18] 18 (05/23 0458) BP: (120-130)/(50-67) 125/67 mmHg (05/23 0656) SpO2:  [91 %-95 %] 91 % (05/23 0458) Weight change:  Last BM Date: 05/05/15 Intake/Output from previous day: +840 05/22 0701 - 05/23 0700 In: 840 [P.O.:840] Out: -  Intake/Output this shift:    PE: General:Pleasant affect, NAD Skin:Warm and dry, brisk capillary refill HEENT:normocephalic, sclera clear, mucus membranes moist Heart:S1S2 RRR without murmur, gallup, rub or click Lungs:clear without rales, rhonchi, or wheezes HFS:FSEL, non tender, + BS, do not palpate liver spleen or masses Ext:no lower ext edema, 2+ pedal pulses, 2+ radial pulses Neuro:alert and oriented, MAE, follows commands, + facial symmetry Tele: SR with PACs   Lab Results: No results for input(s): WBC, HGB, HCT, PLT in the last 72 hours. BMET  Recent Labs  05/05/15 0520 05/06/15 0410  NA 138 137  K 4.2 4.2  CL 98* 98*  CO2 32 31  GLUCOSE 122* 114*  BUN 11 12    CREATININE 0.86 0.80  CALCIUM 8.9 8.8*   No results for input(s): TROPONINI in the last 72 hours.  Invalid input(s): CK, MB  Lab Results  Component Value Date   CHOL 195 03/05/2015   HDL 45 03/05/2015   LDLCALC 134* 03/05/2015   TRIG 78 03/05/2015   CHOLHDL 4.3 03/05/2015   Lab Results  Component Value Date   HGBA1C 6.1* 03/05/2015     Lab Results  Component Value Date   TSH 2.307 04/23/2015     Studies/Results: No results found.  Medications: I have reviewed the patient's current medications. Scheduled Meds: . apixaban  5 mg Oral BID  . atorvastatin  80 mg Oral q1800  . carvedilol  18.75 mg Oral BID WC  . dofetilide  500 mcg Oral BID  . furosemide  40 mg Oral BID  . lisinopril  5 mg Oral Daily  . loratadine  10 mg Oral Daily  . sodium chloride  3 mL Intravenous Q12H   Continuous Infusions:  PRN Meds:.sodium chloride, acetaminophen, HYDROcodone-acetaminophen, sodium chloride, triamcinolone cream  Assessment/Plan: 1. Persistent atrial fibrillation/atrial flutter Now in sinus with Tikosyn loading. QT is stable yesterday, today's is pending Will need significant lifestyle modification for long-term success. He does not snore and does not use alcohol.  Keep K >3.9, Mg >1.8  (today K+4.2 and Mg+ 1.9) Continue Eliquis for CHADS2VASC score of 2  Probable d/c today   2. HTN Stable No change required today  3. Morbid obesity Weight loss encouraged Will refer patient to AF clinic nutrition program and may need to consider bariatric surgery He previously  lost 130 pounds with the [employer] (was >400 at that time)  4. 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 He does not wish to continue wearing LifeVest. Discussion with patient about data available on the [facility] patient population - he and his wife will contemplate if they would like to  continue LifeVest at discharge. Will need re-evaluation of EF in 3 months post restoration of SR - if EF remains depressed, will need to be considered for primary prevention ICD.   5. CAD 70% LAD lesion at cath earlier this month Continue BB/statin      LOS: 3 days   Time spent with pt. :15 minutes. [facility] R  Nurse Practitioner Certified Pager [phone] or after 5pm and on weekends call [phone] 05/06/2015, 8:25 AM

## 2015-05-06 NOTE — Discharge Summary (Signed)
Physician Discharge Summary       Patient ID: Arthur Church MRN: [id number] DOB/AGE: 1956-02-25 59 y.o.  Admit date: 05/03/2015 Discharge date: 05/06/2015 Primary Cardiologist:Dr. Hilty  Discharge Diagnoses:  Principal Problem:   Persistent atrial fibrillation, now SR at discharge 05/06/15 Active Problems:   Morbid obesity   Non-ischemic cardiomyopathy   Anticoagulation goal of INR 2 to 3, CHADS score of at least 2   Discharged Condition: good  Procedures: none, Tikosyn load  Hospital Course:  59 y.o. male with a past medical history significant for morbid obesity, pre-diabetes, and hypertension. He presented to the hospital in 02/2015 in typical atrial flutter and heart failure and underwent TEE/cardioversion. EF at that time was found to be 20-25%. He was placed on medical therapy and subsequently developed rate controlled atrial fibrillation. It was felt that his cardiomyopathy was tachycardia mediated. A lifevest was placed prior to discharge. He was seen recently by [name] who recommended cardiac catheterization which demonstrated 70% mid LAD stenosis with an EF of 20%. He was placed back on anticoagulation and underwent TEE 05/02/15 with plans for Tikosyn admission the next day. TEE demonstrated EF 20-25%, diffuse hypokinesis, moderately dilated LA.  He reported compliance with Eliquis.   Pt placed on Tikosyn and converted to SR.   Tikosyn 500 mg BID.  Today with SR with PACs.  K+ at discharge 4.2, Mg + 1.9.  Qtc 470 ms.  [name] discussed lifevest with pt and his wife. Data does not reflect an apparent benfit from Lifevest in nonischemics. He does not wish to continue wearing LifeVest. Discussion with patient about data available on the [facility] patient population - he and his wife decided to discontinue.    Will need re-evaluation of EF in 3 months post restoration of SR - if EF remains depressed, will need to be considered for primary prevention ICD.   He was seen and  found stable for discharge by [name].  He will follow up in 1 week for labs and EKG with pharmacy in our [facility].     Consults: None  Significant Diagnostic Studies:  BMP Latest Ref Rng 05/06/2015 05/05/2015 05/04/2015  Glucose 65 - 99 mg/dL 782(N) 562(Z) 308(M)  BUN 6 - 20 mg/dL Creatinine 0.61 - 1.24 mg/dL 5.78 4.69 6.29  Sodium 135 - 145 mmol/L 137 138 137  Potassium 3.5 - 5.1 mmol/L 4.2 4.2 4.2  Chloride 101 - 111 mmol/L 98(L) 98(L) 101  CO2 22 - 32 mmol/L 31 32 26  Calcium 8.9 - 10.3 mg/dL 5.2(W) 8.9 4.1(L)   CBC Latest Ref Rng 04/23/2015 04/06/2015 03/29/2015  WBC 4.0 - 10.5 K/uL 10.9(H) - 12.0(H)  Hemoglobin 13.0 - 17.0 g/dL 24.4 17.7(H) 15.7  Hematocrit 39.0 - 52.0 % 43.8 52.0 47.0  Platelets 150 - 400 K/uL 266 - 307.0   Magnesium  1.7, 2.0, and 1.9. TSH 2.3      Discharge Exam: Blood pressure 125/67, pulse 66, temperature 97.9 F (36.6 C), temperature source Oral, resp. rate 18, SpO2 91 %.  Disposition: Home    Medication List    TAKE these medications        acetaminophen 500 MG tablet  Commonly known as:  TYLENOL  Take 500 mg by mouth every 6 (six) hours as needed for mild pain or moderate pain.     apixaban 5 MG Tabs tablet  Commonly known as:  ELIQUIS  Take 1 tablet (5 mg total) by mouth 2 (two)  times daily.     atorvastatin 80 MG tablet  Commonly known as:  LIPITOR  Take 1 tablet (80 mg total) by mouth daily at 6 PM.     carvedilol 12.5 MG tablet  Commonly known as:  COREG  Take 1.5 tablets (18.75 mg total) by mouth 2 (two) times daily with a meal.     dofetilide 500 MCG capsule  Commonly known as:  TIKOSYN  Take 1 capsule (500 mcg total) by mouth 2 (two) times daily.     furosemide 40 MG tablet  Commonly known as:  LASIX  Take 1 tablet (40 mg total) by mouth 2 (two) times daily.     lisinopril 10 MG tablet  Commonly known as:  PRINIVIL  Take 0.5 tablets (5 mg total) by mouth daily.     loratadine 10 MG tablet    Commonly known as:  CLARITIN  Take 10 mg by mouth daily.     triamcinolone cream 0.1 %  Commonly known as:  KENALOG  Apply to affected areas twice a day for up to two weeks, avoid face.       Follow-up Information    Follow up with [facility] On 05/14/2015.   Why:  at 11:45AM   Contact information:   [address] [place] [address]       Follow up with [facility] On 05/31/2015.   Why:  at 10:30AM   Contact information:   [address] [place] [phone]      Follow up with Sherryl Manges, MD On 07/23/2015.   Specialty:  Cardiology   Why:  at 2:30PM   Contact information:   [address] [place] [address] [phone]       Follow up with Chrystie Nose, MD On 05/17/2015.   Specialty:  Cardiology   Why:  at 1000AM   Contact information:   [address] [place] [address] [phone]        Discharge Instructions: Do not miss your eliquis or Tikosyn.  Keep appt on the 31st and have lab work along with EKG done that day.  Heart Healthy diet.  Weigh daily Call [phone] if weight climbs more than 3 pounds in a day or 5 pounds in a week. No salt to very little salt in your diet.  No more than 2000 mg in a day. Call if increased shortness of breath or increased swelling.    Signed: Leone Brand Nurse Practitioner-Certified [facility]: [facility] 05/06/2015, 9:36 AM  Time spent on discharge : >30 minutes.

## 2015-05-06 NOTE — Progress Notes (Signed)
Patient Name: Arthur Church      SUBJECTIVE: without complaint fo SOB  Or CP  Past Medical History  Diagnosis Date  . Hypertension   . Chronic systolic CHF (congestive heart failure)     a. 2D ECHO (03/06/15) EF 20-25%, diffuse HK. Asc aortic diameter: 17mm. Mild LA dilation, mild RV dilation. Mild RV systolic dysfunction   b. LifeVest placed on 02/2015 admissoin   . Morbid obesity     a. BMI ~46  . Persistent atrial fibrillation     a. newly dx 02/2015 admission. s/p successful TEE/DCCV  b. on Eliquis  . ETOH abuse   . Pre-diabetes     a. HgA1c 6.1  . Elevated TSH   . Coronary artery disease     a. 70% LAD lesion    Scheduled Meds:  Scheduled Meds: . apixaban  5 mg Oral BID  . atorvastatin  80 mg Oral q1800  . carvedilol  18.75 mg Oral BID WC  . dofetilide  500 mcg Oral BID  . furosemide  40 mg Oral BID  . lisinopril  5 mg Oral Daily  . loratadine  10 mg Oral Daily  . sodium chloride  3 mL Intravenous Q12H   Continuous Infusions:  sodium chloride, acetaminophen, HYDROcodone-acetaminophen, sodium chloride, triamcinolone cream    PHYSICAL EXAM Filed Vitals:   05/05/15 1420 05/05/15 2010 05/06/15 0458 05/06/15 0656  BP: 128/58 120/50 130/53 125/67  Pulse: 68 110 41 66  Temp: 98.2 F (36.8 C) 98.1 F (36.7 C) 97.9 F (36.6 C)   TempSrc: Oral Oral Oral   Resp: 18 18 18    SpO2: 94% 95% 91%    Well developed and nourished in no acute distress HENT normal Neck supple with JVP-flat Clear Regular rate and rhythm, no murmurs or gallops Abd-soft with active BS No Clubbing cyanosis edema Skin-warm and dry A & Oriented  Grossly normal sensory and motor function   TELEMETRY: Reviewed telemetry pt in *nsr with pac    Intake/Output Summary (Last 24 hours) at 05/06/15 0807 Last data filed at 05/05/15 1920  Gross per 24 hour  Intake    840 ml  Output      0 ml  Net    840 ml    LABS: Basic Metabolic Panel:  Recent Labs Lab 05/03/15 1011  05/03/15 1942 05/04/15 0400 05/05/15 0520 05/06/15 0410  NA 138 138 137 138 137  K 3.9 4.7 4.2 4.2 4.2  CL 100 97* 101 98* 98*  CO2 34* 33* 26 32 31  GLUCOSE 108* 131* 107* 122* 114*  BUN 17 16 15 11 12   CREATININE 0.86 0.93 0.78 0.86 0.80  CALCIUM 9.6 9.4 8.8* 8.9 8.8*  MG 1.7 2.0 1.8 2.0 1.9   Cardiac Enzymes:  BNP: BNP (last 3 results)  Recent Labs  03/04/15 2023  BNP 410.6*    ProBNP (last 3 results)  Recent Labs  03/29/15 1334  PROBNP 172.0*    ECG  QTc 490 freq PAC .22/    ASSESSMENT AND PLAN:  Principal Problem:   Persistent atrial fibrillation Active Problems:   Morbid obesity   Non-ischemic cardiomyopathy  Has converted to Sinus, freq PACs make me less sanguine re long term benefit,  Not PVI candidate per JA 2/2 size D/C today Tikosyn clinic followup Has appt with CHilty DCarroll  4wks-6 wks  SK 12 weeks with echo to decide re ICD  no need to go home with lifevest  Plan revied  Signed, Sherryl Manges MD  05/06/2015

## 2015-05-06 NOTE — Care Management Note (Signed)
Case Management Note  Patient Details  Name: SAJE KANEMOTO MRN: [id number] Date of Birth: [birth year]  Subjective/Objective:    Pt admitted with afib for Tikosyn load                Action/Plan: PTA pt lived at home with wife, had LifeVest prior to admission  Expected Discharge Date:                  Expected Discharge Plan:  Home/Self Care  In-House Referral:     Discharge planning Services  CM Consult, Medication Assistance  Post Acute Care Choice:    Choice offered to:     DME Arranged:    DME Agency:     HH Arranged:    HH Agency:     Status of Service:  Completed, signed off  Medicare Important Message Given:  No Date Medicare IM Given:    Medicare IM give by:    Date Additional Medicare IM Given:    Additional Medicare Important Message give by:     If discussed at Long Length of Stay Meetings, dates discussed:    Additional Comments: 05/06/15- Pt for d/c home today on Tikosyn- per pt he would prefer to use [facility] to fill his Tikosyn- call made to pharmacy who states that they are in Tikosyn program and can order drug when script provided- pt has been given a  7 day supply from [facility] pharmacy in order to allow his pharmacy to get drug in stock.  Will attempt benefits check and let pt know info on copay cost prior to discharge if it returns otherwise will call pt at home. Pt also has questions regarding his LifeVest and if he still needs to wear it along with how much exercise he can do- have let his bedside RN know about these questions to f/u on for d/c instructions.   Donn Pierini Mead, [phone] 05/06/2015, 12:12 PM

## 2015-05-07 ENCOUNTER — Telehealth: Payer: Self-pay | Admitting: Internal Medicine

## 2015-05-07 NOTE — Telephone Encounter (Signed)
Returned call to patient he stated he wanted to ask Dr.Hilty if ok to remove life vest.Spoke to Dr.Hilty he advised ok to remove life vest.Advised to keep post hospital appointment with Dr.Hilty 05/17/15 at 10:00 am.

## 2015-05-07 NOTE — Telephone Encounter (Signed)
Pt was wearing a Life Vest before he was admitted to the hospital on Friday. He was released yesterday,they told him he did not to wear the Life Vest anymore. Pt need to know if [name] wants him to continue wearing it or should he return it to the company?

## 2015-05-14 ENCOUNTER — Ambulatory Visit (INDEPENDENT_AMBULATORY_CARE_PROVIDER_SITE_OTHER): Payer: 59 | Admitting: Pharmacist

## 2015-05-14 DIAGNOSIS — I481 Persistent atrial fibrillation: Secondary | ICD-10-CM | POA: Diagnosis not present

## 2015-05-14 DIAGNOSIS — I4819 Other persistent atrial fibrillation: Secondary | ICD-10-CM

## 2015-05-14 LAB — BASIC METABOLIC PANEL
BUN: 21 mg/dL (ref 6–23)
CO2: 31 mEq/L (ref 19–32)
Calcium: 9.5 mg/dL (ref 8.4–10.5)
Chloride: 99 mEq/L (ref 96–112)
Creatinine, Ser: 0.87 mg/dL (ref 0.40–1.50)
GFR: 95.86 mL/min (ref >60.00)
Glucose, Bld: 92 mg/dL (ref 70–99)
POTASSIUM: 4.3 meq/L (ref 3.5–5.1)
SODIUM: 136 meq/L (ref 135–145)

## 2015-05-14 LAB — MAGNESIUM: MAGNESIUM: 1.7 mg/dL (ref 1.5–2.5)

## 2015-05-17 ENCOUNTER — Ambulatory Visit (INDEPENDENT_AMBULATORY_CARE_PROVIDER_SITE_OTHER): Payer: 59 | Admitting: Internal Medicine

## 2015-05-17 ENCOUNTER — Encounter: Payer: Self-pay | Admitting: Internal Medicine

## 2015-05-17 VITALS — BP 128/66 | HR 60 | Ht 73.0 in | Wt 341.4 lb

## 2015-05-17 DIAGNOSIS — I484 Atypical atrial flutter: Secondary | ICD-10-CM | POA: Diagnosis not present

## 2015-05-17 DIAGNOSIS — I5021 Acute systolic (congestive) heart failure: Secondary | ICD-10-CM

## 2015-05-17 DIAGNOSIS — I429 Cardiomyopathy, unspecified: Secondary | ICD-10-CM | POA: Diagnosis not present

## 2015-05-17 DIAGNOSIS — I481 Persistent atrial fibrillation: Secondary | ICD-10-CM

## 2015-05-17 DIAGNOSIS — Z5181 Encounter for therapeutic drug level monitoring: Secondary | ICD-10-CM

## 2015-05-17 DIAGNOSIS — I4819 Other persistent atrial fibrillation: Secondary | ICD-10-CM

## 2015-05-17 DIAGNOSIS — Z7901 Long term (current) use of anticoagulants: Secondary | ICD-10-CM

## 2015-05-17 DIAGNOSIS — I428 Other cardiomyopathies: Secondary | ICD-10-CM
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
## 2015-05-17 NOTE — Progress Notes (Signed)
     Chief Complaint: Atrial fibrillation   HPI Arthur Church is a 59 y.o. male pt of Dr. Rennis Golden seen today for Tikosyn post hospital follow up.  He was admitted from 05/03/15-05/06/15.  He was initiated on BID.  He converted to SR with no need for DCCV.  His QTc remained stable throughout hospitalization.  QTc on discharge was 480 msec. His lifevest was also removed prior to discharge.     Pt is accompanied by his wife today.  He states he is doing well since discharge.  He has had no difficulties in obtaining the medication and has not missed any doses.     EKG reviewed by 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   Current Outpatient Prescriptions  Medication Sig Dispense Refill  . acetaminophen (TYLENOL) 500 MG tablet Take 500 mg by mouth every 6 (six) hours as needed for mild pain or moderate pain.    Marland Kitchen apixaban (ELIQUIS) 5 MG TABS tablet Take 1 tablet (5 mg total) by mouth 2 (two) times daily. 60 tablet 11  . atorvastatin (LIPITOR) 80 MG tablet Take 1 tablet (80 mg total) by mouth daily at 6 PM. 30 tablet 11  . carvedilol (COREG) 12.5 MG tablet Take 1.5 tablets (18.75 mg total) by mouth 2 (two) times daily with a meal. 180 tablet 3  . dofetilide (TIKOSYN) 500 MCG capsule Take 1 capsule (500 mcg total) by mouth 2 (two) times daily. 60 capsule 11  . furosemide (LASIX) 40 MG tablet Take 1 tablet (40 mg total) by mouth 2 (two) times daily. 60 tablet 11  . lisinopril (PRINIVIL) 10 MG tablet Take 0.5 tablets (5 mg total) by mouth daily. 30 tablet 11  . loratadine (CLARITIN) 10 MG tablet Take 10 mg by mouth daily.    Marland Kitchen triamcinolone cream (KENALOG) 0.1 % Apply to affected areas twice a day for up to two weeks, avoid face. (Patient taking differently: Apply 1 application topically 2 (two) times daily as needed (affected areas). ) 80 g 0   No current facility-administered medications for this visit.      Allergies: No Known  Allergies   Assessment and Plan:   Atrial fibrillation/flutter: Pt doing well since discharge. QTc stable.  Will check electrolytes today and pt scheduled to follow up with Dr. Rennis Golden on 6/3

## 2015-05-17 NOTE — Patient Instructions (Addendum)
Your physician has requested that you have an echocardiogram in September 2016. Echocardiography is a painless test that uses sound waves to create images of your heart. It provides your doctor with information about the size and shape of your heart and how well your heart's chambers and valves are working. This procedure takes approximately one hour. There are no restrictions for this procedure.  Your physician recommends that you schedule a follow-up appointment in September - after your echocardiogram

## 2015-05-31 ENCOUNTER — Telehealth: Payer: Self-pay | Admitting: *Deleted

## 2015-05-31 ENCOUNTER — Ambulatory Visit (HOSPITAL_COMMUNITY): Payer: 59 | Admitting: Nurse Practitioner

## 2015-05-31 NOTE — Telephone Encounter (Signed)
Left message for patient to call me back regarding his Home sleep study.

## 2015-06-03 ENCOUNTER — Other Ambulatory Visit: Payer: Self-pay | Admitting: *Deleted

## 2015-06-03 ENCOUNTER — Encounter: Payer: Self-pay | Admitting: *Deleted

## 2015-06-03 ENCOUNTER — Telehealth: Payer: Self-pay | Admitting: *Deleted

## 2015-06-03 DIAGNOSIS — G4733 Obstructive sleep apnea (adult) (pediatric): Secondary | ICD-10-CM

## 2015-06-03 NOTE — Telephone Encounter (Signed)
Called patient to give him the results of his home sleep study.  He is scheduled for a CPAP Titration 08/20/15. A letter has been sent to the patient so that he has the appt date and the address of the facility.

## 2015-06-06 ENCOUNTER — Telehealth: Payer: Self-pay

## 2015-06-06 MED ORDER — LISINOPRIL 10 MG PO TABS: 5.0000 mg | ORAL_TABLET | Freq: Every day | ORAL | Status: DC

## 2015-06-06 NOTE — Telephone Encounter (Signed)
[facility] sent in refill request for Lisinopril 10 MG and pt had been using [facility] and had lisinopril 30 tabs with 11 refills sent to [facility] in March 2016--I spoke to [facility] pharmacist who said the pt had no refills remaining---so I called and spoke to [facility] pharmacist who assured me that the remaining refills were sent to [facility] when pt trasffered to them. I will forward this to RN

## 2015-06-06 NOTE — Telephone Encounter (Signed)
Called patient and he requested refill for lisinopril to go to [facility].  Rx(s) sent to pharmacy electronically.

## 2015-06-28 ENCOUNTER — Ambulatory Visit (INDEPENDENT_AMBULATORY_CARE_PROVIDER_SITE_OTHER): Payer: 59 | Admitting: Family Medicine

## 2015-06-28 ENCOUNTER — Telehealth: Payer: Self-pay | Admitting: Family Medicine

## 2015-06-28 ENCOUNTER — Encounter: Payer: Self-pay | Admitting: Family Medicine

## 2015-06-28 VITALS — BP 136/71 | HR 62 | Wt 346.0 lb

## 2015-06-28 DIAGNOSIS — I10 Essential (primary) hypertension: Secondary | ICD-10-CM | POA: Diagnosis not present

## 2015-06-28 DIAGNOSIS — R7303 Prediabetes: Secondary | ICD-10-CM

## 2015-06-28 DIAGNOSIS — R7309 Other abnormal glucose: Secondary | ICD-10-CM

## 2015-06-28 LAB — POCT GLYCOSYLATED HEMOGLOBIN (HGB A1C): HEMOGLOBIN A1C: 6.4

## 2015-06-28 NOTE — Telephone Encounter (Signed)
Arthur Church/Arthur Church, Can you please call [employer] and see which procedures and specialist visits need a back dated referral?  He has seen multiple cardiologists and the referral placed in May is not sufficient from what he and I can tell.

## 2015-06-28 NOTE — Progress Notes (Signed)
CC: Arthur Church is a 59 y.o. male is here for prediabetes and Hypertension   Subjective: HPI:  Follow-up essential hypertension: Continues on follow-up essential hypertension: Continues on carvedilol and lisinopril. No outside blood pressures report but overall good numbers from what I can see in Epic at his cardiology office visits. He's been successful with removing a large portion of sodium and that was in his diet. He reports room for improvement with exercise. Denies chest pain shortness of breath orthopnea nor peripheral edema   Follow-up prediabetes: No formal carbohydrate restrictions. Admits room for improvement with exercising. Denies polyuria plication or polydipsia. Denies poorly healing wounds blood sugars to report  Review Of Systems Outlined In HPI  Past Medical History  Diagnosis Date  . Hypertension   . Chronic systolic CHF (congestive heart failure)     a. 2D ECHO (03/06/15) EF 20-25%, diffuse HK. Asc aortic diameter: 44mm. Mild LA dilation, mild RV dilation. Mild RV systolic dysfunction   b. LifeVest placed on 02/2015 admissoin   . Morbid obesity     a. BMI ~46  . Persistent atrial fibrillation     a. newly dx 02/2015 admission. s/p successful TEE/DCCV  b. on Eliquis  . ETOH abuse   . Pre-diabetes     a. HgA1c 6.1  . Elevated TSH   . Coronary artery disease     a. 70% LAD lesion    Past Surgical History  Procedure Laterality Date  . Hernia repair    . Tee without cardioversion N/A 03/06/2015    Procedure: TRANSESOPHAGEAL ECHOCARDIOGRAM (TEE);  Surgeon: Laurey Morale, MD;  Location: [facility] ENDOSCOPY;  Service: Cardiovascular;  Laterality: N/A;  . Cardioversion N/A 03/06/2015    Procedure: CARDIOVERSION;  Surgeon: Laurey Morale, MD;  Location: [facility] ENDOSCOPY;  Service: Cardiovascular;  Laterality: N/A;  . Cardiac catheterization N/A 04/26/2015    Procedure: Left Heart Cath and Coronary Angiography;  Surgeon: Lyn Records, MD;  Location: [facility] INVASIVE CV LAB;  Service:  Cardiovascular;  Laterality: N/A;  . Tee without cardioversion N/A 05/02/2015    Procedure: TRANSESOPHAGEAL ECHOCARDIOGRAM (TEE);  Surgeon: Lewayne Bunting, MD;  Location: [facility] ENDOSCOPY;  Service: Cardiovascular;  Laterality: N/A;   Family History  Problem Relation Age of Onset  . Uterine cancer Sister   . Heart attack Mother   . Breast cancer Mother   . Sleep apnea Brother   . Heart attack Maternal Grandfather   . Sleep apnea Brother     History   Social History  . Marital Status: Married    Spouse Name: N/A  . Number of Children: N/A  . Years of Education: N/A   Occupational History  . Not on file.   Social History Main Topics  . Smoking status: Former Smoker    Types: Cigarettes    Quit date: 01/02/2013  . Smokeless tobacco: Never Used     Comment: smokes about a week  . Alcohol Use: 0.0 oz/week    0 Standard drinks or equivalent per week  . Drug Use: No  . Sexual Activity: Not Currently   Other Topics Concern  . Not on file   Social History Narrative     Objective: BP 136/71 mmHg  Pulse 62  Wt 346 lb (156.945 kg)  General: Alert and Oriented, No Acute Distress HEENT: Pupils equal, round, reactive to light. Conjunctivae clear.  Moist mucous membranes pharynx unremarkable Lungs: Clear to auscultation bilaterally, no wheezing/ronchi/rales.  Comfortable work of breathing. Good air movement. Cardiac:  Regular rate and rhythm. Normal S1/S2.  No murmurs, rubs, nor gallops.   Abdomen:  Obese and soft Extremities: No peripheral edema.  Strong peripheral pulses.  Mental Status: No depression, anxiety, nor agitation. Skin: Warm and dry.  Assessment & Plan: Arthur Church was seen today for prediabetes and hypertension.  Diagnoses and all orders for this visit:  Pre-diabetes Orders: -     POCT HgB A1C  Essential hypertension   Prediabetes: Controlled with an A1c of 6.4. Discussed dietary carbohydrate changes and light exercise to help lower this. Essential  hypertension: Controlled continue lisinopril and carvedilol.  Return in about 3 months (around 09/28/2015).

## 2015-07-01 NOTE — Telephone Encounter (Signed)
Back dated referral for 03/15/2015. New referral number is [id number]

## 2015-07-01 NOTE — Telephone Encounter (Signed)
Sue Lush, Can you update the patient about Angela's success with this?

## 2015-07-01 NOTE — Telephone Encounter (Signed)
Pt's wife notified.

## 2015-07-05 ENCOUNTER — Ambulatory Visit: Payer: 59 | Admitting: Family Medicine

## 2015-07-09 ENCOUNTER — Telehealth: Payer: Self-pay | Admitting: Internal Medicine

## 2015-07-09 NOTE — Telephone Encounter (Signed)
Victorino Dike would like to speak with the Clinical Manager regarding her father's care.

## 2015-07-09 NOTE — Telephone Encounter (Signed)
Lm for [employer].  FYI---Jennifer is not listed on patient's DPR.

## 2015-07-09 NOTE — Telephone Encounter (Signed)
Jennifer would like to speak with the Clinical Manager regarding her father's care. 

## 2015-07-23 ENCOUNTER — Ambulatory Visit (INDEPENDENT_AMBULATORY_CARE_PROVIDER_SITE_OTHER): Payer: 59 | Admitting: Internal Medicine

## 2015-07-23 ENCOUNTER — Encounter: Payer: Self-pay | Admitting: Internal Medicine

## 2015-07-23 VITALS — BP 132/84 | HR 64 | Ht 73.0 in | Wt 349.4 lb

## 2015-07-23 DIAGNOSIS — I429 Cardiomyopathy, unspecified: Secondary | ICD-10-CM

## 2015-07-23 DIAGNOSIS — Z79899 Other long term (current) drug therapy: Secondary | ICD-10-CM | POA: Diagnosis not present

## 2015-07-23 DIAGNOSIS — I481 Persistent atrial fibrillation: Secondary | ICD-10-CM | POA: Diagnosis not present

## 2015-07-23 DIAGNOSIS — I428 Other cardiomyopathies: Secondary | ICD-10-CM

## 2015-07-23 DIAGNOSIS — I4819 Other persistent atrial fibrillation: Secondary | ICD-10-CM

## 2015-07-23 LAB — BASIC METABOLIC PANEL
BUN: 19 mg/dL (ref 6–23)
CHLORIDE: 99 meq/L (ref 96–112)
CO2: 33 meq/L — AB (ref 19–32)
Calcium: 9.8 mg/dL (ref 8.4–10.5)
Creatinine, Ser: 0.79 mg/dL (ref 0.40–1.50)
GFR: 107.07 mL/min (ref >60.00)
GLUCOSE: 99 mg/dL (ref 70–99)
POTASSIUM: 4.5 meq/L (ref 3.5–5.1)
SODIUM: 137 meq/L (ref 135–145)

## 2015-07-23 MED ORDER — LISINOPRIL 10 MG PO TABS: 10.0000 mg | ORAL_TABLET | Freq: Every day | ORAL | Status: DC

## 2015-07-23 MED ORDER — CARVEDILOL 25 MG PO TABS: 25.0000 mg | ORAL_TABLET | Freq: Two times a day (BID) | ORAL | Status: DC

## 2015-07-23 NOTE — Progress Notes (Signed)
Patient Care Team: Laren Boom, DO as PCP - General (Family Medicine)   HPI  Arthur Church is a 59 y.o. male Seen in follow-up for atrial fibrillation for which he presented to hospital 5/16. He was found to have an ejection fraction of 20-25% when he first presented with atrial flutter in March. This remained depressed in May. He underwent catheterization at that time demonstrating an ejection fraction of 20% with this 70% LAD stenosis. He was started on Tikosyn and reverted to sinus rhythm.  Entresto started and discontinued because of  Hyperkalemia  He is tolerating carvedilol and lisinopril. Exercise tolerance has improved.  He is undergoing his sleep studies. He is having atypical fleeting chest pain.   Records and Results Reviewed  As above  Past Medical History  Diagnosis Date  . Hypertension   . Chronic systolic CHF (congestive heart failure)     a. 2D ECHO (03/06/15) EF 20-25%, diffuse HK. Asc aortic diameter: 7mm. Mild LA dilation, mild RV dilation. Mild RV systolic dysfunction   b. LifeVest placed on 02/2015 admissoin   . Morbid obesity     a. BMI ~46  . Persistent atrial fibrillation     a. newly dx 02/2015 admission. s/p successful TEE/DCCV  b. on Eliquis  . ETOH abuse   . Pre-diabetes     a. HgA1c 6.1  . Elevated TSH   . Coronary artery disease     a. 70% LAD lesion    Past Surgical History  Procedure Laterality Date  . Hernia repair    . Tee without cardioversion N/A 03/06/2015    Procedure: TRANSESOPHAGEAL ECHOCARDIOGRAM (TEE);  Surgeon: Laurey Morale, MD;  Location: [facility] ENDOSCOPY;  Service: Cardiovascular;  Laterality: N/A;  . Cardioversion N/A 03/06/2015    Procedure: CARDIOVERSION;  Surgeon: Laurey Morale, MD;  Location: [facility] ENDOSCOPY;  Service: Cardiovascular;  Laterality: N/A;  . Cardiac catheterization N/A 04/26/2015    Procedure: Left Heart Cath and Coronary Angiography;  Surgeon: Lyn Records, MD;  Location: [facility] INVASIVE CV LAB;  Service:  Cardiovascular;  Laterality: N/A;  . Tee without cardioversion N/A 05/02/2015    Procedure: TRANSESOPHAGEAL ECHOCARDIOGRAM (TEE);  Surgeon: Lewayne Bunting, MD;  Location: [facility] ENDOSCOPY;  Service: Cardiovascular;  Laterality: N/A;    Current Outpatient Prescriptions  Medication Sig Dispense Refill  . acetaminophen (TYLENOL) 500 MG tablet Take 500 mg by mouth every 6 (six) hours as needed for mild pain or moderate pain.    Marland Kitchen apixaban (ELIQUIS) 5 MG TABS tablet Take 1 tablet (5 mg total) by mouth 2 (two) times daily. 60 tablet 11  . atorvastatin (LIPITOR) 80 MG tablet Take 1 tablet (80 mg total) by mouth daily at 6 PM. 30 tablet 11  . carvedilol (COREG) 12.5 MG tablet Take 1.5 tablets (18.75 mg total) by mouth 2 (two) times daily with a meal. 180 tablet 3  . dofetilide (TIKOSYN) 500 MCG capsule Take 1 capsule (500 mcg total) by mouth 2 (two) times daily. 60 capsule 11  . furosemide (LASIX) 40 MG tablet Take 1 tablet (40 mg total) by mouth 2 (two) times daily. 60 tablet 11  . lisinopril (PRINIVIL) 10 MG tablet Take 0.5 tablets (5 mg total) by mouth daily. 15 tablet 11  . loratadine (CLARITIN) 10 MG tablet Take 10 mg by mouth daily.    Marland Kitchen triamcinolone cream (KENALOG) 0.1 % Apply 1 application topically 2 (two) times daily as needed (affected areas).  No current facility-administered medications for this visit.    No Known Allergies    Review of Systems negative except from HPI and PMH  Physical Exam BP 132/84 mmHg  Pulse 64  Ht  (1.854 m)  Wt 349 lb 6.4 oz (158.487 kg)  BMI 46.11 kg/m2 Well developed and well nourished in no acute distress HENT normal E scleral and icterus clear Neck Supple JVP flat; carotids brisk and full Clear to ausculation  Regular rate and rhythm, no murmurs gallops or rub Soft with active bowel sounds No clubbing cyanosis  Edema Alert and oriented, grossly normal motor and sensory function Skin Warm and Dry  ECG demonstrates sinus rhythm at 64  intervals 22/10/44  Assessment and  Plan  Atrial flutter/fibrillation  Hypertension  Chronic systolic heart failure  High risk medication  Obstructive sleep apnea.  Morbid obesity  We will increase his carvedilol from 18.75--25; sinus bradycardia might preclude further up titration towards 50. We will increase his lisinopril from 5--10. He is to see Dr. Rennis Golden in the next few weeks. He will need repeat metabolic profile.    In the event that his ejection fractions over 35%, no specific EP follow-up will be necessary. Otherwise we will plan to see him at that juncture for consideration of ICD implantation.  He will need ongoing surveillance for his high risk dofetilide. We will check his metabolic profile today.  Blood pressure is reasonably controlled.  I encouraged him to increase his exercise as possible  Sleep evaluation therapy is ongoing.

## 2015-07-23 NOTE — Patient Instructions (Addendum)
Medication Instructions:  Your physician has recommended you make the following change in your medication:  1) INCREASE Carvedilol to 25 mg twice a day 2) INCREASE Lisinopril to 10 mg daily  Labwork: Today: BMET  Testing/Procedures: None ordered  Follow-Up: Your physician wants you to follow-up in: 6 months with Rudi Coco, NP.  You will receive a reminder letter in the mail two months in advance. If you don't receive a letter, please call our office to schedule the follow-up appointment.  Any Other Special Instructions Will Be Listed Below (If Applicable). Thank you for choosing  [facility]!!

## 2015-08-06 ENCOUNTER — Telehealth: Payer: Self-pay

## 2015-08-06 NOTE — Telephone Encounter (Signed)
Pt wife called in needing refill of Coreg because of dose increase at last office visit. I called [facility] and verbally authorized coreg refill 60 tablets with 6 rremaining refills based of last office visit with Dr Graciela Husbands.     Medication Detail      Disp Refills Start End     carvedilol (COREG) 25 MG tablet 60 tablet 6 07/23/2015     Sig - Route: Take 1 tablet (25 mg total) by mouth 2 (two) times daily with a meal. - Oral    Class: No Print    Notes to Pharmacy: Increased dosage     Pharmacy

## 2015-08-16 ENCOUNTER — Telehealth: Payer: Self-pay | Admitting: *Deleted

## 2015-08-16 NOTE — Telephone Encounter (Signed)
Wife called and states they received a letter stating the insurance was not going to pay for the "sleep study." The wife is confused because the patient has already had a sleep study done. The wife said in the message that she needs to know what to do so she can cancel the " sleep study" if need be.  I called and spoke with I believe the daughter. I  looked at future appt and saw that pt is not scheduled for sleep study but a CPAP titration. The daughter said "whatever the patient is scheduled for is what it the insurance is not going to cover. After speaking with the daughter it sounds like the titration is not going to be covered because of whatever dx was associated with the titration. The other provider form the other office has to change this. I cannot be exactly sure since I have not seen the letter and the daughter herself did not have the letter in front of her. i advised them to call the office of whoever schedule the cpap titration;

## 2015-08-20 ENCOUNTER — Encounter (HOSPITAL_BASED_OUTPATIENT_CLINIC_OR_DEPARTMENT_OTHER): Payer: 59

## 2015-08-23 ENCOUNTER — Ambulatory Visit (HOSPITAL_COMMUNITY): Payer: 59 | Attending: Internal Medicine

## 2015-08-23 ENCOUNTER — Other Ambulatory Visit: Payer: Self-pay

## 2015-08-23 DIAGNOSIS — I429 Cardiomyopathy, unspecified: Secondary | ICD-10-CM | POA: Diagnosis not present

## 2015-08-23 DIAGNOSIS — Z87891 Personal history of nicotine dependence: Secondary | ICD-10-CM | POA: Diagnosis not present

## 2015-08-23 DIAGNOSIS — I10 Essential (primary) hypertension: Secondary | ICD-10-CM | POA: Diagnosis not present

## 2015-08-23 DIAGNOSIS — Z8249 Family history of ischemic heart disease and other diseases of the circulatory system: Secondary | ICD-10-CM | POA: Diagnosis not present

## 2015-08-23 DIAGNOSIS — I517 Cardiomegaly: Secondary | ICD-10-CM | POA: Insufficient documentation

## 2015-08-23 DIAGNOSIS — I428 Other cardiomyopathies: Secondary | ICD-10-CM

## 2015-09-06 ENCOUNTER — Ambulatory Visit (INDEPENDENT_AMBULATORY_CARE_PROVIDER_SITE_OTHER): Payer: 59 | Admitting: Internal Medicine

## 2015-09-06 ENCOUNTER — Encounter: Payer: Self-pay | Admitting: Internal Medicine

## 2015-09-06 VITALS — BP 118/66 | HR 73 | Ht 73.0 in | Wt 357.3 lb

## 2015-09-06 DIAGNOSIS — I872 Venous insufficiency (chronic) (peripheral): Secondary | ICD-10-CM

## 2015-09-06 DIAGNOSIS — I428 Other cardiomyopathies: Secondary | ICD-10-CM

## 2015-09-06 DIAGNOSIS — I484 Atypical atrial flutter: Secondary | ICD-10-CM | POA: Diagnosis not present

## 2015-09-06 DIAGNOSIS — I868 Varicose veins of other specified sites: Secondary | ICD-10-CM

## 2015-09-06 DIAGNOSIS — I481 Persistent atrial fibrillation: Secondary | ICD-10-CM | POA: Diagnosis not present

## 2015-09-06 DIAGNOSIS — Z7901 Long term (current) use of anticoagulants: Secondary | ICD-10-CM

## 2015-09-06 DIAGNOSIS — Z5181 Encounter for therapeutic drug level monitoring: Secondary | ICD-10-CM

## 2015-09-06 DIAGNOSIS — I4819 Other persistent atrial fibrillation: Secondary | ICD-10-CM

## 2015-09-06 DIAGNOSIS — I8390 Asymptomatic varicose veins of unspecified lower extremity: Secondary | ICD-10-CM | POA: Insufficient documentation

## 2015-09-06 DIAGNOSIS — I429 Cardiomyopathy, unspecified: Secondary | ICD-10-CM
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
## 2015-09-06 NOTE — Patient Instructions (Signed)
Your physician wants you to follow-up in: 6 MONTHS WITH DR HILTY You will receive a reminder letter in the mail two months in advance. If you don't receive a letter, please call our office to schedule the follow-up appointment.   REFERRAL TO [facility] FOR VENOUS INSUFFICIENCY   Your physician has requested that you have a lower extremity venous duplex. This test is an ultrasound of the veins in the legs or arms. It looks at venous blood flow that carries blood from the heart to the legs or arms. Allow one hour for a Lower Venous exam. Allow thirty minutes for an Upper Venous exam. There are no restrictions or special instructions. AT [facility] PRIOR TO APPOINTMENT

## 2015-09-16 ENCOUNTER — Telehealth: Payer: Self-pay | Admitting: Internal Medicine

## 2015-09-16 ENCOUNTER — Encounter: Payer: Self-pay | Admitting: *Deleted

## 2015-09-16 NOTE — Telephone Encounter (Signed)
Ok to go back to work.  Dr. Rexene Edison

## 2015-09-16 NOTE — Telephone Encounter (Signed)
Pt's wife is calling requesting a medical release form so that the pt can return back to work. He has been out of work for about 6 months with CHF.  Thanks

## 2015-09-16 NOTE — Telephone Encounter (Signed)
Letter to go back to work faxed to Attn: Larina Bras @ [phone] per wife.

## 2015-09-27 ENCOUNTER — Ambulatory Visit: Payer: 59 | Admitting: Family Medicine

## 2015-10-04 ENCOUNTER — Ambulatory Visit: Payer: 59 | Admitting: Family Medicine

## 2015-11-19 NOTE — Telephone Encounter (Signed)
Order home CPAP titration from 5 to 18 cm H2O

## 2015-11-19 NOTE — Telephone Encounter (Signed)
CPAP Titration was denied, and per request from Dr. Mayford Knife, this was appealed.    Pre-Cert team has just let me know that the appeal has also been denied by the insurance commission.   Will forward to Turner to advise.

## 2015-11-20 NOTE — Telephone Encounter (Signed)
Patient stated that he is getting ready to start new insurance so he would like to wait until this starts and try a lab study with that insurance.  As soon as it starts and he has all the paperwork he needs, he will call me to give me the information to see if they will approve it. Pre-Cert has been notified as well.

## 2016-01-17 ENCOUNTER — Telehealth: Payer: Self-pay | Admitting: Internal Medicine

## 2016-01-17 NOTE — Telephone Encounter (Signed)
Returned patient call. Describes progressive SOB, fluid gain, weight "up" (he does not know by how many lbs).  He affirms he is taking 2x40mg  lasix per day. Advised to take extra 1 tab of lasix today, double AM dose Sat & Sun, see if improved.  Call Monday if not better - may warrant flex clinic visit or further intervention if symptoms unimproved or worse. Pt voiced understanding.

## 2016-01-17 NOTE — Telephone Encounter (Signed)
New message      Pt c/o Shortness Of Breath: STAT if SOB developed within the last 24 hours or pt is noticeably SOB on the phone  1. Are you currently SOB (can you hear that pt is SOB on the phone)?  Pt was not on the phone  2. How long have you been experiencing SOB? A few days but today is worse  3. Are you SOB when sitting or when up moving around?  Worse with with exertion  4. Are you currently experiencing any other symptoms? No Not sure if pt needs to be seen today or not since the weekend is coming

## 2016-01-17 NOTE — Telephone Encounter (Signed)
Agree 

## 2016-01-28 ENCOUNTER — Other Ambulatory Visit: Payer: Self-pay | Admitting: Internal Medicine

## 2016-01-28 ENCOUNTER — Other Ambulatory Visit: Payer: Self-pay | Admitting: *Deleted

## 2016-01-28 MED ORDER — FUROSEMIDE 40 MG PO TABS: 40.0000 mg | ORAL_TABLET | Freq: Two times a day (BID) | ORAL | Status: DC

## 2016-01-28 NOTE — Telephone Encounter (Signed)
Rx request sent to pharmacy.  

## 2016-01-29 ENCOUNTER — Other Ambulatory Visit: Payer: Self-pay | Admitting: *Deleted

## 2016-01-29 MED ORDER — FUROSEMIDE 40 MG PO TABS: 40.0000 mg | ORAL_TABLET | Freq: Two times a day (BID) | ORAL | Status: DC

## 2016-02-21 ENCOUNTER — Other Ambulatory Visit: Payer: Self-pay | Admitting: Internal Medicine

## 2016-03-02 ENCOUNTER — Other Ambulatory Visit: Payer: Self-pay | Admitting: Internal Medicine

## 2016-03-04 ENCOUNTER — Other Ambulatory Visit: Payer: Self-pay | Admitting: *Deleted

## 2016-03-04 MED ORDER — ATORVASTATIN CALCIUM 80 MG PO TABS: 80.0000 mg | ORAL_TABLET | Freq: Every day | ORAL | Status: DC

## 2016-03-04 NOTE — Addendum Note (Signed)
Addended by: Awilda Bill on: 03/04/2016 03:03 PM   Modules accepted: Orders

## 2016-03-06 ENCOUNTER — Other Ambulatory Visit: Payer: Self-pay | Admitting: Pharmacist Clinician (PhC)/ Clinical Pharmacy Specialist

## 2016-03-06 MED ORDER — APIXABAN 5 MG PO TABS: 5.0000 mg | ORAL_TABLET | Freq: Two times a day (BID) | ORAL | Status: DC

## 2016-05-04 ENCOUNTER — Other Ambulatory Visit: Payer: Self-pay | Admitting: Cardiology

## 2016-05-05 NOTE — Telephone Encounter (Signed)
Rx(s) sent to pharmacy electronically.  

## 2016-07-04 ENCOUNTER — Other Ambulatory Visit: Payer: Self-pay | Admitting: Internal Medicine

## 2016-08-04 ENCOUNTER — Other Ambulatory Visit: Payer: Self-pay | Admitting: Internal Medicine

## 2016-08-04 MED ORDER — FUROSEMIDE 40 MG PO TABS: 40.0000 mg | ORAL_TABLET | Freq: Two times a day (BID) | ORAL | 0 refills | Status: DC

## 2016-08-04 NOTE — Telephone Encounter (Signed)
Rx(s) sent to pharmacy electronically.  

## 2016-08-06 ENCOUNTER — Other Ambulatory Visit: Payer: Self-pay | Admitting: *Deleted

## 2016-08-06 MED ORDER — TIKOSYN 500 MCG PO CAPS: 500.0000 ug | ORAL_CAPSULE | Freq: Two times a day (BID) | ORAL | 1 refills | Status: DC

## 2016-08-18 ENCOUNTER — Other Ambulatory Visit: Payer: Self-pay | Admitting: Internal Medicine

## 2016-08-28 ENCOUNTER — Other Ambulatory Visit: Payer: Self-pay

## 2016-08-28 MED ORDER — FUROSEMIDE 40 MG PO TABS: 40.0000 mg | ORAL_TABLET | Freq: Two times a day (BID) | ORAL | 0 refills | Status: DC

## 2016-08-31 ENCOUNTER — Other Ambulatory Visit: Payer: Self-pay | Admitting: Physician Assistant

## 2016-09-07 ENCOUNTER — Other Ambulatory Visit: Payer: Self-pay | Admitting: Pharmacist

## 2016-09-07 MED ORDER — APIXABAN 5 MG PO TABS: 5.0000 mg | ORAL_TABLET | Freq: Two times a day (BID) | ORAL | 5 refills | Status: DC

## 2016-09-14 ENCOUNTER — Encounter: Payer: Self-pay | Admitting: Internal Medicine

## 2016-09-14 ENCOUNTER — Ambulatory Visit (INDEPENDENT_AMBULATORY_CARE_PROVIDER_SITE_OTHER): Payer: BLUE CROSS/BLUE SHIELD | Admitting: Internal Medicine

## 2016-09-14 VITALS — BP 158/82 | HR 80 | Ht 73.0 in | Wt 396.2 lb

## 2016-09-14 DIAGNOSIS — I483 Typical atrial flutter: Secondary | ICD-10-CM

## 2016-09-14 DIAGNOSIS — I10 Essential (primary) hypertension: Secondary | ICD-10-CM | POA: Diagnosis not present

## 2016-09-14 DIAGNOSIS — I481 Persistent atrial fibrillation: Secondary | ICD-10-CM

## 2016-09-14 DIAGNOSIS — R7981 Abnormal blood-gas level: Secondary | ICD-10-CM | POA: Diagnosis not present

## 2016-09-14 DIAGNOSIS — I4819 Other persistent atrial fibrillation: Secondary | ICD-10-CM

## 2016-09-14 MED ORDER — FUROSEMIDE 40 MG PO TABS: 40.0000 mg | ORAL_TABLET | Freq: Two times a day (BID) | ORAL | 3 refills | Status: DC

## 2016-09-14 MED ORDER — LISINOPRIL 10 MG PO TABS: 10.0000 mg | ORAL_TABLET | Freq: Every day | ORAL | 3 refills | Status: DC

## 2016-09-14 MED ORDER — APIXABAN 5 MG PO TABS: 5.0000 mg | ORAL_TABLET | Freq: Two times a day (BID) | ORAL | 0 refills | Status: DC

## 2016-09-14 MED ORDER — ATORVASTATIN CALCIUM 80 MG PO TABS: 80.0000 mg | ORAL_TABLET | Freq: Every day | ORAL | 3 refills | Status: DC

## 2016-09-14 MED ORDER — TIKOSYN 500 MCG PO CAPS: 500.0000 ug | ORAL_CAPSULE | Freq: Two times a day (BID) | ORAL | 3 refills | Status: DC

## 2016-09-14 MED ORDER — CARVEDILOL 25 MG PO TABS: 25.0000 mg | ORAL_TABLET | Freq: Two times a day (BID) | ORAL | 3 refills | Status: DC

## 2016-09-14 NOTE — Patient Instructions (Signed)
Medication Instructions:  Your physician recommends that you continue on your current medications as directed. Please refer to the Current Medication list given to you today.  Labwork: None   Testing/Procedures: Your physician has recommended that you have a sleep study. This test records several body functions during sleep, including: brain activity, eye movement, oxygen and carbon dioxide blood levels, heart rate and rhythm, breathing rate and rhythm, the flow of air through your mouth and nose, snoring, body muscle movements, and chest and belly movement.  Follow-Up: Your physician recommends that you schedule a follow-up appointment in: 3 MONTHS with [name].  Any Other Special Instructions Will Be Listed Below (If Applicable).   If you need a refill on your cardiac medications before your next appointment, please call your pharmacy.  NEXT APPT  DATE:______________________________________  TIME:______________________________________AM/PM

## 2016-09-16 DIAGNOSIS — R7981 Abnormal blood-gas level: Secondary | ICD-10-CM | POA: Insufficient documentation
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
## 2016-10-02 ENCOUNTER — Telehealth: Payer: Self-pay | Admitting: Internal Medicine

## 2016-10-02 NOTE — Telephone Encounter (Signed)
New message   Pt wife is calling and she said that Dr.Hilty needs a referral for pt to have sleep study ordered and prior authorization for pt

## 2016-10-02 NOTE — Telephone Encounter (Signed)
Spoke with patient and she stated she spoke with insurance regarding sleep study  Patient will need PA since facility is out of network but if PA done will adjust to secondary to nothing close by in network  Sent to precert department  Advised wife

## 2016-10-23 NOTE — Telephone Encounter (Signed)
Sleep study scheduled.

## 2016-10-25 ENCOUNTER — Ambulatory Visit (HOSPITAL_BASED_OUTPATIENT_CLINIC_OR_DEPARTMENT_OTHER): Payer: BLUE CROSS/BLUE SHIELD | Attending: Internal Medicine | Admitting: Cardiovascular Disease

## 2016-10-25 VITALS — Ht 72.5 in | Wt 395.0 lb

## 2016-10-25 DIAGNOSIS — R7981 Abnormal blood-gas level: Secondary | ICD-10-CM | POA: Diagnosis not present

## 2016-10-25 DIAGNOSIS — I493 Ventricular premature depolarization: Secondary | ICD-10-CM | POA: Insufficient documentation

## 2016-10-25 DIAGNOSIS — I4891 Unspecified atrial fibrillation: Secondary | ICD-10-CM | POA: Diagnosis not present

## 2016-10-25 DIAGNOSIS — G4733 Obstructive sleep apnea (adult) (pediatric): Secondary | ICD-10-CM

## 2016-10-25 DIAGNOSIS — I480 Paroxysmal atrial fibrillation: Secondary | ICD-10-CM

## 2016-10-25 DIAGNOSIS — I4892 Unspecified atrial flutter: Secondary | ICD-10-CM | POA: Insufficient documentation

## 2016-11-10 ENCOUNTER — Ambulatory Visit (HOSPITAL_COMMUNITY)
Admission: EM | Admit: 2016-11-10 | Discharge: 2016-11-10 | Disposition: A | Discharge status: Home / Self Care | Payer: BLUE CROSS/BLUE SHIELD | Attending: Family Medicine | Admitting: Family Medicine

## 2016-11-10 ENCOUNTER — Encounter (HOSPITAL_COMMUNITY): Payer: Self-pay | Admitting: Emergency Medicine

## 2016-11-10 ENCOUNTER — Telehealth: Payer: Self-pay | Admitting: Internal Medicine

## 2016-11-10 DIAGNOSIS — L0291 Cutaneous abscess, unspecified: Secondary | ICD-10-CM

## 2016-11-10 MED ORDER — DOXYCYCLINE HYCLATE 100 MG PO TABS: 100.0000 mg | ORAL_TABLET | Freq: Two times a day (BID) | ORAL | 0 refills | Status: DC

## 2016-11-10 NOTE — Telephone Encounter (Signed)
Spoke with pt, he wanted to let dr hilty know what he had been told. Patient made aware the results are not available yet. Will make dr hilty aware

## 2016-11-10 NOTE — ED Provider Notes (Signed)
[facility]    CSN: [id number] Arrival date & time: 11/10/16  1402     History   Chief Complaint Chief Complaint  Patient presents with  . Abscess    HPI Arthur Church is a 60 y.o. male.   This is a 60 year old man who presents with what he thinks is an abscess on his for head. It started out as little pimple and gradually built up over the last 12 days. He 1 on a cruise to the [place] so he was unable to get medical attention until he got back last night.      Past Medical History:  Diagnosis Date  . Chronic systolic CHF (congestive heart failure) (HCC)    a. 2D ECHO (03/06/15) EF 20-25%, diffuse HK. Asc aortic diameter: 40mm. Mild LA dilation, mild RV dilation. Mild RV systolic dysfunction   b. LifeVest placed on 02/2015 admissoin   . Coronary artery disease    a. 70% LAD lesion  . Elevated TSH   . ETOH abuse   . Hypertension   . Morbid obesity (HCC)    a. BMI ~46  . Persistent atrial fibrillation (HCC)    a. newly dx 02/2015 admission. s/p successful TEE/DCCV  b. on Eliquis  . Pre-diabetes    a. HgA1c 6.1    Patient Active Problem List   Diagnosis Date Noted  . Abnormal pulse oximetry 09/16/2016  . Varicose veins 09/06/2015  . Essential hypertension 06/28/2015  . Anticoagulation goal of INR 2 to 3, CHADS score of at least 2 05/06/2015  . Persistent atrial fibrillation, now SR at discharge 05/06/15 05/03/2015  . Non-ischemic cardiomyopathy (HCC) 05/03/2015  . Systolic CHF (HCC) 04/03/2015  . Rosacea 04/03/2015  . Psoriasis 04/03/2015  . Elevated TSH   . Pre-diabetes   . Atypical atrial flutter (HCC)   . Diarrhea   . Hyperkalemia   . Atrial flutter (HCC) 03/05/2015  . Morbid obesity (HCC) 03/05/2015  . Elevated troponin 03/05/2015  . ETOH abuse 03/05/2015  . Acute systolic congestive heart failure, NYHA class 4 (HCC)     Past Surgical History:  Procedure Laterality Date  . CARDIAC CATHETERIZATION N/A 04/26/2015   Procedure: Left Heart Cath  and Coronary Angiography;  Surgeon: Lyn Records, MD;  Location: [facility] INVASIVE CV LAB;  Service: Cardiovascular;  Laterality: N/A;  . CARDIOVERSION N/A 03/06/2015   Procedure: CARDIOVERSION;  Surgeon: Laurey Morale, MD;  Location: [facility] ENDOSCOPY;  Service: Cardiovascular;  Laterality: N/A;  . HERNIA REPAIR    . TEE WITHOUT CARDIOVERSION N/A 03/06/2015   Procedure: TRANSESOPHAGEAL ECHOCARDIOGRAM (TEE);  Surgeon: Laurey Morale, MD;  Location: [facility]OSCOPY;  Service: Cardiovascular;  Laterality: N/A;  . TEE WITHOUT CARDIOVERSION N/A 05/02/2015   Procedure: TRANSESOPHAGEAL ECHOCARDIOGRAM (TEE);  Surgeon: Lewayne Bunting, MD;  Location: [facility] ENDOSCOPY;  Service: Cardiovascular;  Laterality: N/A;       Home Medications    Prior to Admission medications   Medication Sig Start Date End Date Taking? Authorizing Provider  acetaminophen (TYLENOL) 500 MG tablet Take 500 mg by mouth every 6 (six) hours as needed for mild pain or moderate pain.   Yes Historical Provider, MD  apixaban (ELIQUIS) 5 MG TABS tablet Take 1 tablet (5 mg total) by mouth 2 (two) times daily. 09/14/16  Yes Chrystie Nose, MD  atorvastatin (LIPITOR) 80 MG tablet Take 1 tablet (80 mg total) by mouth daily. 09/14/16  Yes Chrystie Nose, MD  carvedilol (COREG) 25 MG tablet Take 1  tablet (25 mg total) by mouth 2 (two) times daily. 09/14/16  Yes Chrystie Nose, MD  furosemide (LASIX) 40 MG tablet Take 1 tablet (40 mg total) by mouth 2 (two) times daily. 09/14/16  Yes Chrystie Nose, MD  lisinopril (PRINIVIL,ZESTRIL) 10 MG tablet Take 1 tablet (10 mg total) by mouth daily. 09/14/16  Yes Chrystie Nose, MD  loratadine (CLARITIN) 10 MG tablet Take 10 mg by mouth daily.   Yes Historical Provider, MD  TIKOSYN 500 MCG capsule Take 1 capsule (500 mcg total) by mouth 2 (two) times daily. 09/14/16  Yes Chrystie Nose, MD  doxycycline (VIBRA-TABS) 100 MG tablet Take 1 tablet (100 mg total) by mouth 2 (two) times daily. 11/10/16   Elvina Sidle, MD    triamcinolone cream (KENALOG) 0.1 % Apply 1 application topically 2 (two) times daily as needed (affected areas).    Historical Provider, MD    Family History Family History  Problem Relation Age of Onset  . Uterine cancer Sister   . Heart attack Mother   . Breast cancer Mother   . Sleep apnea Brother   . Heart attack Maternal Grandfather   . Sleep apnea Brother     Social History Social History  Substance Use Topics  . Smoking status: Former Smoker    Types: Cigarettes    Quit date: 01/02/2013  . Smokeless tobacco: Never Used     Comment: smokes about a week  . Alcohol use 0.0 oz/week     Allergies   Patient has no known allergies.   Review of Systems Review of Systems  Constitutional: Negative.   HENT: Negative.   Musculoskeletal: Negative.      Physical Exam Triage Vital Signs ED Triage Vitals  Enc Vitals Group     BP 11/10/16 1440 150/83     Pulse Rate 11/10/16 1440 72     Resp 11/10/16 1440 20     Temp 11/10/16 1440 98 F (36.7 C)     Temp Source 11/10/16 1440 Oral     SpO2 11/10/16 1440 (!) 87 %     Weight --      Height --      Head Circumference --      Peak Flow --      Pain Score 11/10/16 1443 0     Pain Loc --      Pain Edu? --      Excl. in GC? --    No data found.   Updated Vital Signs BP 150/83 (BP Location: Right Wrist)   Pulse 72   Temp 98 F (36.7 C) (Oral)   Resp 20   SpO2 (!) 87% Comment: Patient stated that he runs between 87-90 normally  Visual Acuity Right Eye Distance:   Left Eye Distance:   Bilateral Distance:    Right Eye Near:   Left Eye Near:    Bilateral Near:     Physical Exam  Constitutional: He is oriented to person, place, and time. He appears well-developed and well-nourished.  HENT:  Right Ear: External ear normal.  Left Ear: External ear normal.  Mouth/Throat: Oropharynx is clear and moist.  Eyes: Conjunctivae and EOM are normal.  Neck: Normal range of motion. Neck supple.  Pulmonary/Chest:  Effort normal.  Musculoskeletal: Normal range of motion.  Neurological: He is alert and oriented to person, place, and time.  Skin: Skin is warm and dry.  1 cm abscess on mid forehead which is tender, erythematous, nonfluctuant  Nursing  note and vitals reviewed.    UC Treatments / Results  Labs (all labs ordered are listed, but only abnormal results are displayed) Labs Reviewed - No data to display  EKG  EKG Interpretation None       Radiology No results found.  Procedures .Marland KitchenIncision and Drainage Date/Time: 11/10/2016 3:06 PM Performed by: Elvina Sidle Authorized by: Elvina Sidle   Consent:    Consent obtained:  Verbal   Consent given by:  Patient   Risks discussed:  Bleeding   Alternatives discussed:  No treatment Location:    Type:  Abscess   Location:  Head Pre-procedure details:    Skin preparation:  Antiseptic wash Anesthesia (see MAR for exact dosages):    Anesthesia method:  None Procedure type:    Complexity:  Simple Procedure details:    Needle aspiration: no     Incision types:  Stab incision   Incision depth:  Dermal   Scalpel blade:  11   Wound management:  Probed and deloculated   Drainage:  Purulent   Drainage amount:  Moderate   Wound treatment:  Wound left open   Packing materials:  None Post-procedure details:    Patient tolerance of procedure:  Tolerated well, no immediate complications   (including critical care time)  Medications Ordered in UC Medications - No data to display   Initial Impression / Assessment and Plan / UC Course  I have reviewed the triage vital signs and the nursing notes.  Pertinent labs & imaging results that were available during my care of the patient were reviewed by me and considered in my medical decision making (see chart for details).  Clinical Course     Final Clinical Impressions(s) / UC Diagnoses   Final diagnoses:  Abscess    New Prescriptions New Prescriptions   DOXYCYCLINE  (VIBRA-TABS) 100 MG TABLET    Take 1 tablet (100 mg total) by mouth 2 (two) times daily.     Elvina Sidle, MD 11/10/16 [phone]

## 2016-11-10 NOTE — Telephone Encounter (Signed)
Arthur Church is calling to find out about the results of his Sleep Study . When he had the sleep study they stated that he was had low oxygen as well as well as today when he was having the cyst taken care of they told him the same them . Please call at [phone].   Thanks

## 2016-11-10 NOTE — ED Triage Notes (Signed)
The patient presented to the [facility] with a complaint of a possible abscess on his fore head x 10 days.

## 2016-11-15 NOTE — Procedures (Signed)
Patient Name: Arthur KaufmanBice, Kace Study Date: 10/25/2016 Gender: Male D.O.B: 10/04/1956 Age (years): 60 Referring Provider: Lisette AbuKenneth C Hilty Height (inches): 73 Interpreting Physician: Nicki Guadalajarahomas Aaron Boeh MD, ABSM Weight (lbs): 395 RPSGT: Armen PickupFord, Evelyn BMI: 53 MRN: [id number] Neck Size: 19.00  CLINICAL INFORMATION Sleep Study Type: NPSG Indication for sleep study: Excessive daytime sleepiness, atrial fibrillation, snoring Epworth Sleepiness Score: 16  SLEEP STUDY TECHNIQUE As per the AASM Manual for the Scoring of Sleep and Associated Events v2.3 (April 2016) with a hypopnea requiring 4% desaturations. The channels recorded and monitored were frontal, central and occipital EEG, electrooculogram (EOG), submentalis EMG (chin), nasal and oral airflow, thoracic and abdominal wall motion, anterior tibialis EMG, snore microphone, electrocardiogram, and pulse oximetry.  MEDICATIONS apixaban (ELIQUIS) 5 MG TABS tablet atorvastatin (LIPITOR) 80 MG tablet carvedilol (COREG) 25 MG tablet doxycycline (VIBRA-TABS) 100 MG tablet furosemide (LASIX) 40 MG tablet lisinopril (PRINIVIL,ZESTRIL) 10 MG tablet loratadine (CLARITIN) 10 MG tablet TIKOSYN 500 MCG capsule triamcinolone cream (KENALOG) 0.1 %  Medications self-administered by patient taken the night of the study : TYLENOL PM, ELIQUIS, LIPITOR, COREG, LASIX, LISINOPRIL, TIKOSYN  SLEEP ARCHITECTURE The study was initiated at 10:14:40 PM and ended at 4:26:00 AM. Sleep onset time was 13.3 minutes and the sleep efficiency was 75.6%. The total sleep time was 280.6 minutes. Wake after sleep onset (WASO)  was 77.5 minutes. Stage REM latency was 127.5 minutes. The patient spent 10.87% of the night in stage N1 sleep, 54.53% in stage N2 sleep, 0.00% in stage N3 and 34.60% in REM. Alpha intrusion was absent. Supine sleep was 0.00%.  RESPIRATORY PARAMETERS The overall apnea/hypopnea index (AHI) was 8.6 per hour. There were 20 total apneas, including  20 obstructive, 0 central and 0 mixed apneas. There were 20 hypopneas and 44 RERAs. The AHI during Stage REM sleep was 21.0 per hour. AHI while supine was N/A per hour. The mean oxygen saturation was 91.70%. The minimum SpO2 during sleep was 65.00% during REM sleep. Moderate snoring was noted during this study.  CARDIAC DATA The 2 lead EKG demonstrated sinus rhythm, atrial fibrillation. The mean heart rate was 66.68 beats per minute. Other EKG findings include:   Occasional to frequent PACs, isolated PVCs, a ventricular couplet and a ventricular triplet.  LEG MOVEMENT DATA The total PLMS were 147 with a resulting PLMS index of 31.44. Associated arousal with leg movement index was 1.3 .  IMPRESSIONS - Mild obstructive sleep apnea overall (AHI = 8.6/h); howev sleep apnea was of mild/moderate severity during REM sleep with an AHI of 21.0/h. - No significant central sleep apnea occurred during this study (CAI = 0.0/h). - Severe oxygen desaturation to a nadir of 65.00% during REM sleep.  The patient was started on supplemental oxygen at 1 L due to low oxygen saturation. - Reduced sleep efficiency at 75.6%. - The patient snored with Moderate snoring volume. - EKG findings include occasional to frequent PACs, rare PVCs,  Isolated ventricular couplet and ventricular triplet. - Moderate periodic limb movements of sleep occurred during the study. No significant associated arousals.  DIAGNOSIS - Obstructive Sleep Apnea (327.23 [G47.33 ICD-10]) - Nocturnal Hypoxemia (327.26 [G47.36 ICD-10])  RECOMMENDATIONS - Therapeutic CPAP titration to determine optimal pressure required to alleviate sleep disordered breathing. - Efforts should be made to optimize nasal and oral pharyngeal patency. - Consider follow-up cardiac monitoring. - If significant oxygen desaturation is present on CPAP therapy, supplemental oxygen  may be necessary. - Avoid alcohol, sedatives and other CNS depressants that may worsen  sleep apnea and disrupt normal sleep architecture. - Sleep hygiene should be reviewed to assess factors that may improve sleep quality. - Weight management (BMI 53) and regular exercise should be initiated or continued if appropriate.  [Electronically signed] 11/15/2016 11:23 AM  Nicki Guadalajara MD, [facility], ABSM Diplomate, American Board of Sleep Medicine   NPI: [id number] [facility] PH: [phone]   FX: [phone] ACCREDITED BY THE AMERICAN ACADEMY OF SLEEP MEDICINE

## 2016-11-23 ENCOUNTER — Telehealth: Payer: Self-pay | Admitting: Internal Medicine

## 2016-11-23 NOTE — Telephone Encounter (Signed)
New Message  Pt call requesting to speak with RN.  pt states he will waill discuss when RN returns call. Please call back to discuss

## 2016-11-23 NOTE — Telephone Encounter (Signed)
Returned call.   He states problems with low O2 sats when checked. He's denying being overly short of breath but states readings of mid 80s when checked.  Does not have any awareness that he's gained weight, fluid or otherwise.  He preferred to see Dr. Rennis Golden to evaluate. Moved appt at patient preference, pt to come in this week on Thursday. Noted the new appt date/time was suitable.   Have recommended he may take extra lasix dose tomorrow (extra 40mg  AM, beyond his usual 40mg  BID) and Wednesday to see if any change in symptoms.  Further instructions to follow appt/provider recommendations.

## 2016-11-24 NOTE — Telephone Encounter (Signed)
Thanks .Marland Kitchen I'll look forward to seeing him later this week.  Dr. Rexene Edison

## 2016-11-26 ENCOUNTER — Ambulatory Visit: Payer: BLUE CROSS/BLUE SHIELD | Admitting: Internal Medicine

## 2016-12-03 ENCOUNTER — Other Ambulatory Visit: Payer: Self-pay | Admitting: *Deleted

## 2016-12-03 ENCOUNTER — Telehealth: Payer: Self-pay | Admitting: *Deleted

## 2016-12-03 DIAGNOSIS — G4733 Obstructive sleep apnea (adult) (pediatric): Secondary | ICD-10-CM

## 2016-12-03 NOTE — Telephone Encounter (Signed)
Left message sleep study positive for OSA. CPAP titration study will be ordered.

## 2016-12-03 NOTE — Progress Notes (Signed)
Left message sleep study positive for OSA. CPAP titration study will be ordered. 

## 2016-12-03 NOTE — Telephone Encounter (Signed)
-----   Message from Lennette Bihari, MD sent at 11/15/2016 11:32 AM EST ----- Burna Mortimer, please set up for CPAP titration study.

## 2016-12-04 ENCOUNTER — Ambulatory Visit (INDEPENDENT_AMBULATORY_CARE_PROVIDER_SITE_OTHER): Payer: BLUE CROSS/BLUE SHIELD | Admitting: Internal Medicine

## 2016-12-04 ENCOUNTER — Ambulatory Visit: Payer: BLUE CROSS/BLUE SHIELD | Admitting: Internal Medicine

## 2016-12-04 ENCOUNTER — Encounter: Payer: Self-pay | Admitting: Internal Medicine

## 2016-12-04 DIAGNOSIS — I428 Other cardiomyopathies: Secondary | ICD-10-CM | POA: Diagnosis not present

## 2016-12-04 DIAGNOSIS — R0902 Hypoxemia: Secondary | ICD-10-CM | POA: Insufficient documentation
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
## 2016-12-04 NOTE — Patient Instructions (Signed)
Medication Instructions:  Your physician recommends that you continue on your current medications as directed. Please refer to the Current Medication list given to you today.  Labwork: None   Testing/Procedures: none  Follow-Up: Your physician recommends that you schedule a follow-up appointment in: 3 MONTHS WITH DR HILTY.  Any Other Special Instructions Will Be Listed Below (If Applicable).  If you need a refill on your cardiac medications before your next appointment, please call your pharmacy.  NEXT APPOINTMENT   DATE:______________________________________________   TIME:_______________________________________AM/PM

## 2017-01-01 ENCOUNTER — Telehealth: Payer: Self-pay | Admitting: Internal Medicine

## 2017-01-01 NOTE — Telephone Encounter (Signed)
Spoke to patient's wife. She states they have co-pay cards for each medication but was told by the pharmacy that he has to meet his $1000 deductible before the co-pay card can be used.   Patient has enough medication to last him until Monday Jan 22.   Patient's wife has program thru [employer] where they make monthly payments on all the hospital bills at once.  Called [facility] outpatient pharmacy to inquire if they offer a similar program - they do not as hospital billing and pharmacy is separate.   Offered samples of eliquis so that they only have to pay out of pocket for Tikosyn to get close to meeting $1000 deductible. Wife will pick up on Monday Jan 22 4 boxes [id number] Exp: March 2020

## 2017-01-01 NOTE — Telephone Encounter (Signed)
New message      Pt went to get eliquis and tikosyn refilled and they both (combined) were over $1,000 dollars.  Please call

## 2017-01-12 ENCOUNTER — Other Ambulatory Visit: Payer: Self-pay

## 2017-01-12 MED ORDER — TIKOSYN 500 MCG PO CAPS: 500.0000 ug | ORAL_CAPSULE | Freq: Two times a day (BID) | ORAL | 1 refills | Status: DC
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
## 2017-02-11 ENCOUNTER — Telehealth: Payer: Self-pay | Admitting: Internal Medicine

## 2017-02-11 NOTE — Telephone Encounter (Signed)
New Message  Pt voiced requesting to speak with nurse-did not state as to what.  Please f/u

## 2017-02-11 NOTE — Telephone Encounter (Signed)
Returned call-patient states he is running out of Lasix before he is able to renew his rx.  States he takes extra sometimes and is running out of them.  Patient takes 40mg  BID-verified with patient.  Reports he is having to take an extra 40mg  x 3days about 2x a month.  He becomes SOB and his weight increases, he takes an extra 40mg  for 3 days and it resolves.    No issues at current-no SOB, no weight gain, edema.    Patient reports he has enough to last until his appt with Dr. Rennis Golden on 3/26.    Advised to continue as directed and Dr. Rennis Golden can readjust his rx or dosage at appt if needed.    Pt agreed and verbalized understanding.

## 2017-03-08 ENCOUNTER — Ambulatory Visit: Payer: BLUE CROSS/BLUE SHIELD | Admitting: Internal Medicine

## 2017-03-08 ENCOUNTER — Other Ambulatory Visit: Payer: Self-pay | Admitting: Internal Medicine

## 2017-03-08 MED ORDER — APIXABAN 5 MG PO TABS: 5.0000 mg | ORAL_TABLET | Freq: Two times a day (BID) | ORAL | 6 refills | Status: DC

## 2017-03-08 NOTE — Telephone Encounter (Signed)
E-SENT TO PHARMACY FOR Arthur Church

## 2017-03-09 ENCOUNTER — Encounter: Payer: Self-pay | Admitting: Internal Medicine

## 2017-03-16 ENCOUNTER — Telehealth: Payer: Self-pay | Admitting: Pharmacist

## 2017-03-16 MED ORDER — DOFETILIDE 500 MCG PO CAPS: 500.0000 ug | ORAL_CAPSULE | Freq: Two times a day (BID) | ORAL | 1 refills | Status: DC

## 2017-03-16 MED ORDER — ELIQUIS 5 MG PO TABS: 5.0000 mg | ORAL_TABLET | Freq: Two times a day (BID) | ORAL | 5 refills | Status: DC

## 2017-03-16 NOTE — Telephone Encounter (Signed)
Patient needs rx for Eliquis brand name to be able to use co-pay card for commercial insurance and Tikosyn Rx for generic    Both prescriptions sent to prefer pharmacy as requested.

## 2017-05-17 ENCOUNTER — Ambulatory Visit (INDEPENDENT_AMBULATORY_CARE_PROVIDER_SITE_OTHER): Payer: BLUE CROSS/BLUE SHIELD

## 2017-05-17 ENCOUNTER — Ambulatory Visit (INDEPENDENT_AMBULATORY_CARE_PROVIDER_SITE_OTHER): Payer: BLUE CROSS/BLUE SHIELD | Admitting: Sports Medicine

## 2017-05-17 ENCOUNTER — Encounter: Payer: Self-pay | Admitting: Sports Medicine

## 2017-05-17 VITALS — BP 144/80 | HR 67 | Wt 371.0 lb

## 2017-05-17 DIAGNOSIS — Z0000 Encounter for general adult medical examination without abnormal findings: Secondary | ICD-10-CM | POA: Diagnosis not present

## 2017-05-17 DIAGNOSIS — I10 Essential (primary) hypertension: Secondary | ICD-10-CM | POA: Diagnosis not present

## 2017-05-17 DIAGNOSIS — R0902 Hypoxemia: Secondary | ICD-10-CM

## 2017-05-17 DIAGNOSIS — H6122 Impacted cerumen, left ear: Secondary | ICD-10-CM

## 2017-05-17 DIAGNOSIS — I481 Persistent atrial fibrillation: Secondary | ICD-10-CM

## 2017-05-17 DIAGNOSIS — R062 Wheezing: Secondary | ICD-10-CM | POA: Diagnosis not present

## 2017-05-17 DIAGNOSIS — R05 Cough: Secondary | ICD-10-CM

## 2017-05-17 DIAGNOSIS — J9611 Chronic respiratory failure with hypoxia: Secondary | ICD-10-CM | POA: Insufficient documentation

## 2017-05-17 DIAGNOSIS — R059 Cough, unspecified: Secondary | ICD-10-CM

## 2017-05-17 DIAGNOSIS — R0602 Shortness of breath: Secondary | ICD-10-CM

## 2017-05-17 DIAGNOSIS — M79671 Pain in right foot: Secondary | ICD-10-CM

## 2017-05-17 DIAGNOSIS — I4819 Other persistent atrial fibrillation: Secondary | ICD-10-CM

## 2017-05-17 MED ORDER — MONTELUKAST SODIUM 10 MG PO TABS: 10.0000 mg | ORAL_TABLET | Freq: Every day | ORAL | 3 refills | Status: DC

## 2017-05-17 MED ORDER — PREDNISONE 50 MG PO TABS
ORAL_TABLET | ORAL | 0 refills | Status: DC
Start: 2017-05-17 — End: 2018-02-23

## 2017-05-17 MED ORDER — DOXYCYCLINE HYCLATE 100 MG PO TABS: 100.0000 mg | ORAL_TABLET | Freq: Two times a day (BID) | ORAL | 0 refills | Status: AC

## 2017-05-17 NOTE — Assessment & Plan Note (Addendum)
With inspiratory and expiratory wheezes in the right lung field. Prednisone, doxycycline instead of azithromycin considering current antiarrhythmics, chest x-ray, Singulair.  Return in 2 weeks for this.

## 2017-05-17 NOTE — Assessment & Plan Note (Signed)
To heavy for medication to be effective, I think he needs a gastric sleeve, referral to [place] surgery. Has multiple comorbidities.

## 2017-05-17 NOTE — Assessment & Plan Note (Signed)
Elevated, we will keep an eye on this. 

## 2017-05-17 NOTE — Assessment & Plan Note (Signed)
This likely represents navicular stress fracture. Patient declines boot, he tells me it is heading in the right direction.

## 2017-05-17 NOTE — Assessment & Plan Note (Signed)
Irrigation as above. 

## 2017-05-17 NOTE — Progress Notes (Signed)
Subjective:    CC: Establish care, physical  HPI:  Atrial fibrillation: Stable with antiarrhythmics and Eliquis.  Hypertension: Slightly elevated, no headaches, visual changes, chest pain.  Morbid obesity: Agrees to discuss this with bariatric surgery.  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 Overall he tells me this is improving considerably.  Cough: Present for several weeks now, productive of yellowish sputum, no constitutional symptoms.  Past medical history:  Negative.  See flowsheet/record as well for more information.  Surgical history: Negative.  See flowsheet/record as well for more information.  Family history: Negative.  See flowsheet/record as well for more information.  Social history: Negative.  See flowsheet/record as well for more information.  Allergies, and medications have been entered into the medical record, reviewed, and no changes needed.    Review of Systems: No headache, visual changes, nausea, vomiting, diarrhea, constipation, dizziness, abdominal pain, skin rash, fevers, chills, night sweats, swollen lymph nodes, weight loss, chest pain, body aches, joint swelling, muscle aches, shortness of breath, mood changes, visual or auditory hallucinations.  Objective:    General: Well Developed, well nourished, and in no acute distress.  Neuro: Alert and oriented x3, extra-ocular muscles intact, sensation grossly intact. Cranial nerves II through XII are intact, motor, sensory, and coordinative functions are all intact. HEENT: Normocephalic, atraumatic, pupils equal round reactive to light, neck supple, no masses, no lymphadenopathy, thyroid nonpalpable. Oropharynx, nasopharynx, unremarkable, left ear canal is occluded with cerumen  Skin: Warm and dry, no rashes noted.  Cardiac: Regular rate and rhythm, no murmurs rubs or gallops.  Respiratory: Clear to auscultation  bilaterally. Not using accessory muscles, speaking in full sentences.  Abdominal: Soft, nontender, nondistended, positive bowel sounds, no masses, no organomegaly.  Right Foot: No visible erythema or swelling. Range of motion is full in all directions. Strength is 5/5 in all directions. No hallux valgus. No pes cavus or pes planus. No abnormal callus noted. No pain over the navicular prominence, or base of fifth metatarsal. No tenderness to palpation of the calcaneal insertion of plantar fascia. No pain at the Achilles insertion. No pain over the calcaneal bursa. No pain of the retrocalcaneal bursa. Tender to palpation over the plantar aspect of the navicular No hallux rigidus or limitus. No tenderness palpation over interphalangeal joints. No pain with compression of the metatarsal heads. Neurovascularly intact distally. Rectal: Smooth prostate. Good tone.  Indication: Cerumen impaction of the left ear(s) Medical necessity statement: On physical examination, cerumen impairs clinically significant portions of the external auditory canal, and tympanic membrane. Noted obstructive, copious cerumen that cannot be removed without irrigation Consent: Discussed benefits and risks of procedure and verbal consent obtained Procedure: Patient was prepped for the procedure. Utilized an otoscope to assess and take note of the ear canal, the tympanic membrane, and the presence, amount, and placement of the cerumen. Gentle water irrigation was utilized to remove cerumen.  Post procedure examination: shows cerumen was completely removed. Patient tolerated procedure well. The patient is made aware that they may experience temporary vertigo, temporary hearing loss, and temporary discomfort. If these symptom last for more than 24 hours to call the clinic or proceed to the ED.  Impression and Recommendations:    The patient was counselled, risk factors were discussed, anticipatory guidance given.  Annual  physical exam Checking routine blood work, referral for colonoscopy, does have some blood in his stool.  Essential hypertension  Elevated, we will keep an eye on this.  Morbid obesity To heavy for medication to be effective, I think he needs a gastric sleeve, referral to [place] surgery. Has multiple comorbidities.  Persistent atrial fibrillation, now SR at discharge 05/06/15 Episodes of atrial flutter, continue antiarrhythmics, Eliquis.  Right foot pain This likely represents navicular stress fracture. Patient declines boot, he tells me it is heading in the right direction.  Cough With inspiratory and expiratory wheezes in the right lung field. Prednisone, doxycycline instead of azithromycin considering current antiarrhythmics, chest x-ray, Singulair.  Return in 2 weeks for this.  Hearing loss due to cerumen impaction, left Irrigation as above.

## 2017-05-17 NOTE — Assessment & Plan Note (Addendum)
Checking routine blood work, referral for colonoscopy, does have some blood in his stool.

## 2017-05-17 NOTE — Assessment & Plan Note (Signed)
Episodes of atrial flutter, continue antiarrhythmics, Eliquis.

## 2017-05-25 ENCOUNTER — Encounter: Payer: Self-pay | Admitting: Gastroenterology

## 2017-05-31 ENCOUNTER — Ambulatory Visit: Payer: BLUE CROSS/BLUE SHIELD | Admitting: Sports Medicine

## 2017-06-04 ENCOUNTER — Ambulatory Visit: Payer: BLUE CROSS/BLUE SHIELD | Admitting: Gastroenterology

## 2017-06-30 ENCOUNTER — Telehealth: Payer: Self-pay | Admitting: Internal Medicine

## 2017-06-30 NOTE — Telephone Encounter (Signed)
Received notice from pharmacy that Tikosyn was denied. Attempted to do PA and prior auth for generic Tikosyn is not needed. Brand name Rx was sent in to local pharmacy so that patient can use coupon per chart review.   Key: [id number]

## 2017-07-01 ENCOUNTER — Encounter: Payer: Self-pay | Admitting: Sports Medicine

## 2017-07-02 ENCOUNTER — Encounter: Payer: Self-pay | Admitting: Internal Medicine

## 2017-07-05 ENCOUNTER — Other Ambulatory Visit: Payer: Self-pay | Admitting: *Deleted

## 2017-07-05 MED ORDER — DOFETILIDE 500 MCG PO CAPS: 500.0000 ug | ORAL_CAPSULE | Freq: Two times a day (BID) | ORAL | 1 refills | Status: DC

## 2017-08-03 ENCOUNTER — Other Ambulatory Visit: Payer: Self-pay

## 2017-08-03 MED ORDER — FUROSEMIDE 40 MG PO TABS: 40.0000 mg | ORAL_TABLET | Freq: Two times a day (BID) | ORAL | 1 refills | Status: DC

## 2017-09-17 ENCOUNTER — Other Ambulatory Visit: Payer: Self-pay

## 2017-09-17 MED ORDER — LISINOPRIL 10 MG PO TABS: 10.0000 mg | ORAL_TABLET | Freq: Every day | ORAL | 0 refills | Status: DC

## 2017-10-01 ENCOUNTER — Other Ambulatory Visit: Payer: Self-pay | Admitting: *Deleted

## 2017-10-01 MED ORDER — ATORVASTATIN CALCIUM 80 MG PO TABS: 80.0000 mg | ORAL_TABLET | Freq: Every day | ORAL | 0 refills | Status: DC

## 2017-10-04 ENCOUNTER — Other Ambulatory Visit: Payer: Self-pay | Admitting: Internal Medicine

## 2017-10-05 NOTE — Telephone Encounter (Signed)
REFILL 

## 2017-10-20 ENCOUNTER — Encounter: Payer: Self-pay | Admitting: *Deleted

## 2017-10-21 ENCOUNTER — Ambulatory Visit (INDEPENDENT_AMBULATORY_CARE_PROVIDER_SITE_OTHER): Payer: BLUE CROSS/BLUE SHIELD | Admitting: Cardiology

## 2017-10-21 ENCOUNTER — Encounter: Payer: Self-pay | Admitting: Cardiology

## 2017-10-21 DIAGNOSIS — I10 Essential (primary) hypertension: Secondary | ICD-10-CM

## 2017-10-21 DIAGNOSIS — Z23 Encounter for immunization: Secondary | ICD-10-CM | POA: Diagnosis not present

## 2017-10-21 DIAGNOSIS — I480 Paroxysmal atrial fibrillation: Secondary | ICD-10-CM

## 2017-10-21 DIAGNOSIS — I428 Other cardiomyopathies: Secondary | ICD-10-CM

## 2017-10-21 DIAGNOSIS — G4733 Obstructive sleep apnea (adult) (pediatric): Secondary | ICD-10-CM

## 2017-10-21 DIAGNOSIS — G4730 Sleep apnea, unspecified: Secondary | ICD-10-CM

## 2017-10-21 DIAGNOSIS — G4761 Periodic limb movement disorder: Secondary | ICD-10-CM | POA: Insufficient documentation

## 2017-10-21 NOTE — Assessment & Plan Note (Signed)
Controlled.  

## 2017-10-21 NOTE — Progress Notes (Signed)
10/21/2017 Arthur Church   May 09, 1956  [id number]  Primary Physician Monica Bectonhekkekandam, Thomas J, MD Primary Cardiologist: Dr Rennis GoldenHilty  HPI:  61 y/o morbidly obese male her for f/u. He was supposed to be seen Q 6 months but missed his last appointment. The pt has a history of PAF initially diagnosed in March 2016. At that time he had an EF of 20%. He underwent TEE CV and was placed on Tikosyn (Dr Johney FrameAllred). F/U echo in Sept 2016 showed normal LVF - EF 55-60%. Other problems include morbid obesity with a BMI of 50, untreated sleep apnea, HTN, and dyslipidemia. He was recently treated by his PCP for "walking pneumonia". This improved with ABs and steroids. He denies any recent tachycardia.    Current Outpatient Medications  Medication Sig Dispense Refill  . acetaminophen (TYLENOL) 500 MG tablet Take 500 mg by mouth every 6 (six) hours as needed for mild pain or moderate pain.    Marland Kitchen. atorvastatin (LIPITOR) 80 MG tablet Take 1 tablet (80 mg total) by mouth daily. 60 tablet 0  . carvedilol (COREG) 25 MG tablet Take 1 tablet (25 mg total) by mouth 2 (two) times daily. NEED OV. 180 tablet 0  . dofetilide (TIKOSYN) 500 MCG capsule Take 1 capsule (500 mcg total) by mouth 2 (two) times daily. 180 capsule 1  . ELIQUIS 5 MG TABS tablet Take 1 tablet (5 mg total) by mouth 2 (two) times daily. 60 tablet 5  . furosemide (LASIX) 40 MG tablet Take 1 tablet (40 mg total) by mouth 2 (two) times daily. 180 tablet 1  . lisinopril (PRINIVIL,ZESTRIL) 10 MG tablet Take 1 tablet (10 mg total) by mouth daily. 90 tablet 0  . loratadine (CLARITIN) 10 MG tablet Take 10 mg by mouth daily.    . montelukast (SINGULAIR) 10 MG tablet Take 1 tablet (10 mg total) by mouth at bedtime. 90 tablet 3  . predniSONE (DELTASONE) 50 MG tablet One tab PO daily for 5 days. 5 tablet 0   No current facility-administered medications for this visit.     No Known Allergies  Past Medical History:  Diagnosis Date  . Chronic systolic CHF  (congestive heart failure) (HCC)    a. 2D ECHO (03/06/15) EF 20-25%, diffuse HK. Asc aortic diameter: 40mm. Mild LA dilation, mild RV dilation. Mild RV systolic dysfunction   b. LifeVest placed on 02/2015 admissoin   . Coronary artery disease    a. 70% LAD lesion  . Elevated TSH   . ETOH abuse   . Hypertension   . Morbid obesity (HCC)    a. BMI ~46  . Persistent atrial fibrillation (HCC)    a. newly dx 02/2015 admission. s/p successful TEE/DCCV  b. on Eliquis  . Pre-diabetes    a. HgA1c 6.1    Social History   Socioeconomic History  . Marital status: Married    Spouse name: Not on file  . Number of children: Not on file  . Years of education: Not on file  . Highest education level: Not on file  Social Needs  . Financial resource strain: Not on file  . Food insecurity - worry: Not on file  . Food insecurity - inability: Not on file  . Transportation needs - medical: Not on file  . Transportation needs - non-medical: Not on file  Occupational History  . Not on file  Tobacco Use  . Smoking status: Former Smoker    Types: Cigarettes    Last attempt to quit:  01/02/2013    Years since quitting: 4.8  . Smokeless tobacco: Never Used  . Tobacco comment: smokes about a week  Substance and Sexual Activity  . Alcohol use: Yes    Alcohol/week: 0.0 oz  . Drug use: No  . Sexual activity: Not Currently  Other Topics Concern  . Not on file  Social History Narrative  . Not on file     Family History  Problem Relation Age of Onset  . Uterine cancer Sister   . Heart attack Mother   . Breast cancer Mother   . Sleep apnea Brother   . Heart attack Maternal Grandfather   . Sleep apnea Brother      Review of Systems: General: negative for chills, fever, night sweats or weight changes.  Cardiovascular: negative for chest pain, dyspnea on exertion, edema, orthopnea, palpitations, paroxysmal nocturnal dyspnea or shortness of breath Dermatological: negative for rash Respiratory:  negative for cough or wheezing Urologic: negative for hematuria Abdominal: negative for nausea, vomiting, diarrhea, bright red blood per rectum, melena, or hematemesis Neurologic: negative for visual changes, syncope, or dizziness All other systems reviewed and are otherwise negative except as noted above.    Blood pressure (!) 147/81, pulse 71, height 6\' 1"  (1.854 m), weight (!) 381 lb (172.8 kg).  General appearance: alert, cooperative, no distress and morbidly obese Neck: no carotid bruit and no JVD Lungs: clear to auscultation bilaterally Heart: regular rate and rhythm Extremities: Trace LE edema Skin: Skin color, texture, turgor normal. No rashes or lesions Neurologic: Grossly normal  EKG NSR, QTc 454  ASSESSMENT AND PLAN:   PAF (paroxysmal atrial fibrillation) (HCC) TEE CV and Tikosyn Rx- holding NSR  Non-ischemic cardiomyopathy (HCC) Presumably related to AF- EF improved from 20% in March 2016 to 55-60% by echo in Sept 2016  Morbid obesity (HCC) BMI 50. Pt considering bariatric surgery  Essential hypertension Controlled  Sleep apnea Can't afford C-pap   PLAN  Check labs- CMET, TSH, CBC. F/U [name] in 6 months.   Corine Shelter PA-C 10/21/2017 11:08 AM

## 2017-10-21 NOTE — Assessment & Plan Note (Signed)
Presumably related to AF- EF improved from 20% in March 2016 to 55-60% by echo in Sept 2016

## 2017-10-21 NOTE — Assessment & Plan Note (Signed)
TEE CV and Tikosyn Rx- holding NSR

## 2017-10-21 NOTE — Assessment & Plan Note (Signed)
BMI 50. Pt considering bariatric surgery

## 2017-10-21 NOTE — Patient Instructions (Signed)
Medication Instructions:  NO CHANGES If you need a refill on your cardiac medications before your next appointment, please call your pharmacy.  Labwork: CBC, CMET AND MAG TODAY HERE IN OUR OFFICE AT [facility]  Follow-Up: Your physician wants you to follow-up in: 6 MONTHS WITH DR HILTY. You should receive a reminder letter in the mail two months in advance. If you do not receive a letter, please call our office MARCH 2019 to schedule the MAY 2019 follow-up appointment.   Thank you for choosing [facility] at [facility]!!

## 2017-10-21 NOTE — Assessment & Plan Note (Signed)
Can't afford C-pap

## 2017-10-22 LAB — CBC
Hematocrit: 44.6 % (ref 37.5–51.0)
Hemoglobin: 14.8 g/dL (ref 13.0–17.7)
MCH: 29.3 pg (ref 26.6–33.0)
MCHC: 33.2 g/dL (ref 31.5–35.7)
MCV: 88 fL (ref 79–97)
Platelets: 243 10*3/uL (ref 150–379)
RBC: 5.05 x10E6/uL (ref 4.14–5.80)
RDW: 14.4 % (ref 12.3–15.4)
WBC: 8.8 10*3/uL (ref 3.4–10.8)

## 2017-10-22 LAB — COMPREHENSIVE METABOLIC PANEL
ALT: 10 IU/L (ref 0–44)
AST: 13 IU/L (ref 0–40)
Albumin/Globulin Ratio: 1.4 (ref 1.2–2.2)
Albumin: 3.8 g/dL (ref 3.6–4.8)
Alkaline Phosphatase: 72 IU/L (ref 39–117)
BUN/Creatinine Ratio: 17 (ref 10–24)
BUN: 15 mg/dL (ref 8–27)
Bilirubin Total: 0.6 mg/dL (ref 0.0–1.2)
CO2: 31 mmol/L — ABNORMAL HIGH (ref 20–29)
Calcium: 9.6 mg/dL (ref 8.6–10.2)
Chloride: 96 mmol/L (ref 96–106)
Creatinine, Ser: 0.9 mg/dL (ref 0.76–1.27)
GFR calc Af Amer: 107 mL/min/{1.73_m2} (ref >59)
GFR calc non Af Amer: 93 mL/min/{1.73_m2} (ref >59)
Globulin, Total: 2.8 g/dL (ref 1.5–4.5)
Glucose: 139 mg/dL — ABNORMAL HIGH (ref 65–99)
Potassium: 4.3 mmol/L (ref 3.5–5.2)
Sodium: 140 mmol/L (ref 134–144)
Total Protein: 6.6 g/dL (ref 6.0–8.5)

## 2017-10-22 LAB — MAGNESIUM: Magnesium: 1.7 mg/dL (ref 1.6–2.3)

## 2017-11-29 ENCOUNTER — Other Ambulatory Visit: Payer: Self-pay | Admitting: Internal Medicine

## 2017-11-29 NOTE — Telephone Encounter (Signed)
REFILL 

## 2017-12-16 ENCOUNTER — Other Ambulatory Visit: Payer: Self-pay | Admitting: Internal Medicine

## 2017-12-28 ENCOUNTER — Other Ambulatory Visit: Payer: Self-pay | Admitting: Internal Medicine

## 2017-12-29 ENCOUNTER — Other Ambulatory Visit: Payer: Self-pay | Admitting: Internal Medicine

## 2017-12-30 NOTE — Telephone Encounter (Signed)
Rx(s) sent to pharmacy electronically.  

## 2018-01-03 ENCOUNTER — Other Ambulatory Visit: Payer: Self-pay | Admitting: Internal Medicine

## 2018-01-31 ENCOUNTER — Other Ambulatory Visit: Payer: Self-pay | Admitting: Internal Medicine

## 2018-01-31 NOTE — Telephone Encounter (Signed)
Notified pt's wife no tikosyn samples here try calling [address] office tomorrow Per pt's wife have called several pharmacies and are all out of stock ./cy

## 2018-01-31 NOTE — Telephone Encounter (Signed)
Patient calling the office for samples of medication:   1.  What medication and dosage are you requesting samples for?Dofectilide( Tioskyn) 2.  Are you currently out of this medication? yes

## 2018-02-01 MED ORDER — DOFETILIDE 500 MCG PO CAPS: 500.0000 ug | ORAL_CAPSULE | Freq: Two times a day (BID) | ORAL | 2 refills | Status: DC

## 2018-02-01 NOTE — Telephone Encounter (Signed)
°*  STAT* If patient is at the pharmacy, call can be transferred to refill team.   1. Which medications need to be refilled? (please list name of each medication and dose if known)  new prescription for Dofetilide-If the pharmacy does not have this-please notify the pt right away.Pt have already missed two doses. 2. Which pharmacy/location (including street and city if local pharmacy) is medication to be sent to?[facility]  3. Do they need a 30 day or 90 day supply? 60 and refills

## 2018-02-01 NOTE — Telephone Encounter (Signed)
Called [facility] to [facility]. Rx(s) sent to pharmacy electronically. [facility] [address] [facility]. Patient has missed 2 doses of medication

## 2018-02-01 NOTE — Telephone Encounter (Signed)
We do not have samples at [facility] either. I am not sure which pharmacies the patient checked, but there is plenty of dofetilide inventory at the [facility] [address], [place], or [place].

## 2018-02-01 NOTE — Telephone Encounter (Signed)
Inquired about # of missed doses allowed before patient would need to be hospitalization for re-initiation of Tikosyn. Per clinical pharmacist, if missed >2 doses, would need re-initiation.

## 2018-02-23 ENCOUNTER — Encounter (HOSPITAL_COMMUNITY): Payer: Self-pay | Admitting: Emergency Medicine

## 2018-02-23 ENCOUNTER — Ambulatory Visit (HOSPITAL_COMMUNITY)
Admission: EM | Admit: 2018-02-23 | Discharge: 2018-02-23 | Disposition: A | Discharge status: Home / Self Care | Payer: BLUE CROSS/BLUE SHIELD | Attending: Family Medicine | Admitting: Family Medicine

## 2018-02-23 ENCOUNTER — Other Ambulatory Visit: Payer: Self-pay

## 2018-02-23 DIAGNOSIS — J209 Acute bronchitis, unspecified: Secondary | ICD-10-CM | POA: Diagnosis not present

## 2018-02-23 MED ORDER — PREDNISONE 10 MG (21) PO TBPK: ORAL_TABLET | Freq: Every day | ORAL | 0 refills | Status: DC

## 2018-02-23 MED ORDER — DOXYCYCLINE HYCLATE 100 MG PO CAPS: 100.0000 mg | ORAL_CAPSULE | Freq: Two times a day (BID) | ORAL | 0 refills | Status: DC

## 2018-02-23 NOTE — ED Triage Notes (Signed)
Dr. Tracie Harrier notified of pt vital signs

## 2018-02-23 NOTE — ED Triage Notes (Addendum)
Pt c/o cold symptoms, pt states "sometimes when I wake up my oxygen is low and I borrow my wifes oxygen to get myself up, and I grabbed a dirty hose, and now i've got these cold symptoms". C/o coughing up mucous, congestion. Pt o2 level was 74%, pt told to take deep breaths, o2 is now 89%. Pt states that is his normal.

## 2018-02-23 NOTE — ED Provider Notes (Signed)
[facility]   [id number] 02/23/18 Arrival Time: 1203  ASSESSMENT & PLAN:  1. Acute bronchitis, unspecified organism     Meds ordered this encounter  Medications  . predniSONE (STERAPRED UNI-PAK 21 TAB) 10 MG (21) TBPK tablet    Sig: Take by mouth daily. Take as directed.    Dispense:  21 tablet    Refill:  0  . doxycycline (VIBRAMYCIN) 100 MG capsule    Sig: Take 1 capsule (100 mg total) by mouth 2 (two) times daily.    Dispense:  20 capsule    Refill:  0   Given co-morbitities will treat as above. He plans f/u with his PCP. May f/u here as needed. OTC symptom care as needed.  Reviewed expectations re: course of current medical issues. Questions answered. Outlined signs and symptoms indicating need for more acute intervention. Patient verbalized understanding. After Visit Summary given.   SUBJECTIVE: History from: patient.  Arthur Church is a 62 y.o. male who presents with complaint of nasal congestion, post-nasal drainage, and chest congestion. "Mainly congestion in my chest." Onset gradual, approximately several days ago. SOB: none worse than baseline. Wheezing: none. Fever: no. Overall normal PO intake without n/v. Sick contacts: no. OTC treatment: none. Uses O2 at home; see PMH below.  Social History   Tobacco Use  Smoking Status Former Smoker  . Types: Cigarettes  . Last attempt to quit: 01/02/2013  . Years since quitting: 5.1  Smokeless Tobacco Never Used  Tobacco Comment   smokes about a week    ROS: As per HPI.   OBJECTIVE:  Vitals:   02/23/18 1311  BP: (!) 93/41  Pulse: 66  Resp: 18  Temp: 98.6 F (37 C)  SpO2: (!) 89%    Baseline SpO2 approximately 90%  General appearance: alert; no distress HEENT: nasal congestion; clear runny nose; throat irritation secondary to post-nasal drainage Neck: supple without LAD Lungs: unlabored respirations, symmetrical air entry; cough: mild; no respiratory distress Skin: warm and  dry Psychological: alert and cooperative; normal mood and affect  No Known Allergies  Past Medical History:  Diagnosis Date  . Chronic systolic CHF (congestive heart failure) (HCC)    a. 2D ECHO (03/06/15) EF 20-25%, diffuse HK. Asc aortic diameter: 66mm. Mild LA dilation, mild RV dilation. Mild RV systolic dysfunction   b. LifeVest placed on 02/2015 admissoin   . Coronary artery disease    a. 70% LAD lesion  . Elevated TSH   . ETOH abuse   . Hypertension   . Morbid obesity (HCC)    a. BMI ~46  . Persistent atrial fibrillation (HCC)    a. newly dx 02/2015 admission. s/p successful TEE/DCCV  b. on Eliquis  . Pre-diabetes    a. HgA1c 6.1   Family History  Problem Relation Age of Onset  . Uterine cancer Sister   . Heart attack Mother   . Breast cancer Mother   . Sleep apnea Brother   . Heart attack Maternal Grandfather   . Sleep apnea Brother    Social History   Socioeconomic History  . Marital status: Married    Spouse name: Not on file  . Number of children: Not on file  . Years of education: Not on file  . Highest education level: Not on file  Social Needs  . Financial resource strain: Not on file  . Food insecurity - worry: Not on file  . Food insecurity - inability: Not on file  . Transportation needs - medical:  Not on file  . Transportation needs - non-medical: Not on file  Occupational History  . Not on file  Tobacco Use  . Smoking status: Former Smoker    Types: Cigarettes    Last attempt to quit: 01/02/2013    Years since quitting: 5.1  . Smokeless tobacco: Never Used  . Tobacco comment: smokes about a week  Substance and Sexual Activity  . Alcohol use: Yes    Alcohol/week: 0.0 oz  . Drug use: No  . Sexual activity: Not Currently  Other Topics Concern  . Not on file  Social History Narrative  . Not on file           Mardella Layman, MD 03/02/18 [phone]

## 2018-04-01 ENCOUNTER — Other Ambulatory Visit: Payer: Self-pay | Admitting: Internal Medicine

## 2018-04-18 ENCOUNTER — Telehealth: Payer: Self-pay

## 2018-04-18 DIAGNOSIS — F4321 Adjustment disorder with depressed mood: Secondary | ICD-10-CM | POA: Insufficient documentation

## 2018-04-18 MED ORDER — ALPRAZOLAM 0.5 MG PO TABS: 0.5000 mg | ORAL_TABLET | Freq: Three times a day (TID) | ORAL | 0 refills | Status: DC | PRN

## 2018-04-18 MED ORDER — SERTRALINE HCL 50 MG PO TABS: 50.0000 mg | ORAL_TABLET | Freq: Every day | ORAL | 3 refills | Status: DC

## 2018-04-18 NOTE — Telephone Encounter (Signed)
I had a long discussion with Arthur Church on the phone, his wife passed last week, he has stayed his daughter and son-in-law's house until last night, he spent the first night in their house, and this was very difficult on him, severe depressed mood, no suicidal or homicidal ideation, severe episodes of anxiety and panic.  He also needs to sell the house because he cannot afford it on his own now.  We discussed grieving, he is going to get in touch with her hospice organization as they will provide counseling free of charge, I am going to add a 0.5 mg alprazolam to use as needed as well as Zoloft starting at 50 mg. He will return to see me in a couple of days to discuss this in person. 

## 2018-04-18 NOTE — Assessment & Plan Note (Signed)
I had a long discussion with Arthur Church on the phone, his wife passed last week, he has stayed his daughter and son-in-law's house until last night, he spent the first night in their house, and this was very difficult on him, severe depressed mood, no suicidal or homicidal ideation, severe episodes of anxiety and panic.  He also needs to sell the house because he cannot afford it on his own now.  We discussed grieving, he is going to get in touch with her hospice organization as they will provide counseling free of charge, I am going to add a 0.5 mg alprazolam to use as needed as well as Zoloft starting at 50 mg. He will return to see me in a couple of days to discuss this in person.

## 2018-04-18 NOTE — Telephone Encounter (Signed)
Arthur Church is feeling anxious and sad since his wife passed away.

## 2018-04-20 ENCOUNTER — Ambulatory Visit: Payer: BLUE CROSS/BLUE SHIELD | Admitting: Sports Medicine

## 2018-04-26 ENCOUNTER — Ambulatory Visit (INDEPENDENT_AMBULATORY_CARE_PROVIDER_SITE_OTHER): Payer: BLUE CROSS/BLUE SHIELD | Admitting: Sports Medicine

## 2018-04-26 ENCOUNTER — Encounter: Payer: Self-pay | Admitting: Sports Medicine

## 2018-04-26 DIAGNOSIS — F4321 Adjustment disorder with depressed mood: Secondary | ICD-10-CM

## 2018-04-26 NOTE — Progress Notes (Signed)
Subjective:    CC: Grieving  HPI: Arthur Church is a pleasant 62 year old male, unfortunately his wife passed about 2 to 3 weeks ago.  I spoke to him on the phone and we started some alprazolam for panic and anxiety, I started Zoloft, unfortunately he is not able to tolerate it, did have some nausea.  He stopped it after 3 days.  He tells me that using alprazolam 1-2 times per day seems to control his symptoms, he has returned to work, and is fairly functional.  He would like to avoid a controller anxiety medicine and simply stick with the alprazolam to be used as needed for now.  No suicidal or homicidal ideation.  I reviewed the past medical history, family history, social history, surgical history, and allergies today and no changes were needed.  Please see the problem list section below in epic for further details.  Past Medical History: Past Medical History:  Diagnosis Date  . Chronic systolic CHF (congestive heart failure) (HCC)    a. 2D ECHO (03/06/15) EF 20-25%, diffuse HK. Asc aortic diameter: 40mm. Mild LA dilation, mild RV dilation. Mild RV systolic dysfunction   b. LifeVest placed on 02/2015 admissoin   . Coronary artery disease    a. 70% LAD lesion  . Elevated TSH   . ETOH abuse   . Hypertension   . Morbid obesity (HCC)    a. BMI ~46  . Persistent atrial fibrillation (HCC)    a. newly dx 02/2015 admission. s/p successful TEE/DCCV  b. on Eliquis  . Pre-diabetes    a. HgA1c 6.1   Past Surgical History: Past Surgical History:  Procedure Laterality Date  . CARDIAC CATHETERIZATION N/A 04/26/2015   Procedure: Left Heart Cath and Coronary Angiography;  Surgeon: Lyn Records, MD;  Location: [facility] INVASIVE CV LAB;  Service: Cardiovascular;  Laterality: N/A;  . CARDIOVERSION N/A 03/06/2015   Procedure: CARDIOVERSION;  Surgeon: Laurey Morale, MD;  Location: [facility] ENDOSCOPY;  Service: Cardiovascular;  Laterality: N/A;  . HERNIA REPAIR    . TEE WITHOUT CARDIOVERSION N/A 03/06/2015   Procedure:  TRANSESOPHAGEAL ECHOCARDIOGRAM (TEE);  Surgeon: Laurey Morale, MD;  Location: [facility] ENDOSCOPY;  Service: Cardiovascular;  Laterality: N/A;  . TEE WITHOUT CARDIOVERSION N/A 05/02/2015   Procedure: TRANSESOPHAGEAL ECHOCARDIOGRAM (TEE);  Surgeon: Lewayne Bunting, MD;  Location: [facility] ENDOSCOPY;  Service: Cardiovascular;  Laterality: N/A;   Social History: Social History   Socioeconomic History  . Marital status: Married    Spouse name: Not on file  . Number of children: Not on file  . Years of education: Not on file  . Highest education level: Not on file  Occupational History  . Not on file  Social Needs  . Financial resource strain: Not on file  . Food insecurity:    Worry: Not on file    Inability: Not on file  . Transportation needs:    Medical: Not on file    Non-medical: Not on file  Tobacco Use  . Smoking status: Former Smoker    Types: Cigarettes    Last attempt to quit: 01/02/2013    Years since quitting: 5.3  . Smokeless tobacco: Never Used  . Tobacco comment: smokes about a week  Substance and Sexual Activity  . Alcohol use: Yes    Alcohol/week: 0.0 oz  . Drug use: No  . Sexual activity: Not Currently  Lifestyle  . Physical activity:    Days per week: Not on file    Minutes per session: Not  on file  . Stress: Not on file  Relationships  . Social connections:    Talks on phone: Not on file    Gets together: Not on file    Attends religious service: Not on file    Active member of club or organization: Not on file    Attends meetings of clubs or organizations: Not on file    Relationship status: Not on file  Other Topics Concern  . Not on file  Social History Narrative  . Not on file   Family History: Family History  Problem Relation Age of Onset  . Uterine cancer Sister   . Heart attack Mother   . Breast cancer Mother   . Sleep apnea Brother   . Heart attack Maternal Grandfather   . Sleep apnea Brother    Allergies: No Known Allergies Medications:  See med rec.  Review of Systems: No fevers, chills, night sweats, weight loss, chest pain, or shortness of breath.   Objective:    General: Well Developed, well nourished, and in no acute distress.  Good affect, congruent with mood. Neuro: Alert and oriented x3, extra-ocular muscles intact, sensation grossly intact.  HEENT: Normocephalic, atraumatic, pupils equal round reactive to light, neck supple, no masses, no lymphadenopathy, thyroid nonpalpable.  Skin: Warm and dry, no rashes. Cardiac: Regular rate and rhythm, no murmurs rubs or gallops, no lower extremity edema.  Respiratory: Clear to auscultation bilaterally. Not using accessory muscles, speaking in full sentences.  Impression and Recommendations:    Grieving It is now been approximately 2 to 3 weeks since his wife passed. He is back to work, 0.5 mg alprazolam has done a good job blocking his episodes of panic, he did stop his Zoloft due to nausea. I would like to see him back in a week or 2 to see how things are going, I did recommend that we try another SSRI. I am happy to refill alprazolam as needed.  I spent 25 minutes with this patient, greater than 50% was face-to-face time counseling regarding the above diagnoses ___________________________________________ Ihor Austin. Benjamin Stain, M.D., ABFM., CAQSM. Primary Care and Sports Medicine [facility]  Adjunct Instructor of Family Medicine  [employer]

## 2018-04-26 NOTE — Assessment & Plan Note (Signed)
It is now been approximately 2 to 3 weeks since his wife passed. He is back to work, 0.5 mg alprazolam has done a good job blocking his episodes of panic, he did stop his Zoloft due to nausea. I would like to see him back in a week or 2 to see how things are going, I did recommend that we try another SSRI. I am happy to refill alprazolam as needed.

## 2018-04-26 NOTE — Patient Instructions (Signed)

## 2018-05-10 ENCOUNTER — Other Ambulatory Visit: Payer: Self-pay

## 2018-05-10 MED ORDER — ALPRAZOLAM 0.5 MG PO TABS: 0.5000 mg | ORAL_TABLET | Freq: Three times a day (TID) | ORAL | 0 refills | Status: DC | PRN

## 2018-05-10 NOTE — Telephone Encounter (Signed)
Arthur Church wants a refill on xanax.

## 2018-05-23 ENCOUNTER — Ambulatory Visit: Payer: BLUE CROSS/BLUE SHIELD | Admitting: Sports Medicine

## 2018-05-25 ENCOUNTER — Telehealth: Payer: Self-pay | Admitting: Internal Medicine

## 2018-05-25 ENCOUNTER — Telehealth: Payer: Self-pay | Admitting: Cardiology

## 2018-05-25 ENCOUNTER — Other Ambulatory Visit: Payer: Self-pay | Admitting: Internal Medicine

## 2018-05-25 NOTE — Telephone Encounter (Signed)
New Message:       *STAT* If patient is at the pharmacy, call can be transferred to refill team.   1. Which medications need to be refilled? (please list name of each medication and dose if known) carvedilol (COREG) 25 MG tablet  ELIQUIS 5 MG TABS tablet  furosemide (LASIX) 40 MG tablet  lisinopril (PRINIVIL,ZESTRIL) 10 MG tablet   2. Which pharmacy/location (including street and city if local pharmacy) is medication to be sent to?[facility] - [place] - [address] [address] [address]  3. Do they need a 30 day or 90 day supply? 30

## 2018-05-25 NOTE — Telephone Encounter (Signed)
New Message:        *STAT* If patient is at the pharmacy, call can be transferred to refill team.   1. Which medications need to be refilled? (please list name of each medication and dose if known) dofetilide (TIKOSYN) 500 MCG capsule   2. Which pharmacy/location (including street and city if local pharmacy) is medication to be sent to?[facility] - [place] ([place]), [place] - [address]  3. Do they need a 30 day or 90 day supply? 30

## 2018-05-26 ENCOUNTER — Telehealth: Payer: Self-pay | Admitting: *Deleted

## 2018-05-26 MED ORDER — CARVEDILOL 25 MG PO TABS: 25.0000 mg | ORAL_TABLET | Freq: Two times a day (BID) | ORAL | 1 refills | Status: DC

## 2018-05-26 MED ORDER — LISINOPRIL 10 MG PO TABS: 10.0000 mg | ORAL_TABLET | Freq: Every day | ORAL | 1 refills | Status: DC

## 2018-05-26 MED ORDER — FUROSEMIDE 40 MG PO TABS: 40.0000 mg | ORAL_TABLET | Freq: Two times a day (BID) | ORAL | 1 refills | Status: DC

## 2018-05-26 MED ORDER — APIXABAN 5 MG PO TABS: 5.0000 mg | ORAL_TABLET | Freq: Two times a day (BID) | ORAL | 3 refills | Status: DC

## 2018-05-26 MED ORDER — DOFETILIDE 500 MCG PO CAPS: 500.0000 ug | ORAL_CAPSULE | Freq: Two times a day (BID) | ORAL | 1 refills | Status: DC

## 2018-05-26 NOTE — Telephone Encounter (Signed)
refill 

## 2018-05-26 NOTE — Addendum Note (Signed)
Addended by: Pearletha Furl on: 05/26/2018 04:46 PM   Modules accepted: Orders

## 2018-05-26 NOTE — Telephone Encounter (Signed)
Patient stated that he did not need anything. His medications had been refilled already and he has a follow up appointment on 6/24.

## 2018-05-26 NOTE — Telephone Encounter (Signed)
Follow up   Pt is returning call    

## 2018-06-05 NOTE — Progress Notes (Signed)
Cardiology Office Note   Date:  06/06/2018   ID:  Arthur Church, DOB February 10, 1956, MRN [id number]  PCP:  Monica Becton, MD  Cardiologist:  Dr. Tresa Endo   Chief Complaint  Patient presents with  . Follow-up  . Medication Refill     History of Present Illness: Arthur Church is a 62 y.o. male who presents for ongoing assessment and management of PAF (diagnosed 02/2015); patient had a TEE cardioversion and was placed on Tikosyn [name].  Follow-up echocardiogram in September 2016 demonstrated normal LV systolic function of 55% to 60%.    Other history includes morbid obesity with a BMI of greater than 50, sleep apnea, unable to afford CPAP, hypertension, dyslipidemia.  He was last seen in the office on 10/21/2017 by Corine Shelter, PA.  The patient comes today without any cardiac complaints.  He is very depressed.  He has admitted that he has not gotten over the death of his wife which occurred in April.  He continues to grieve for her.  As a result of this a has not been very active, has remained isolated, and continues to deal with profound sadness.  He has not had complaints of rapid heart rhythm, palpitations, dyspnea on exertion, chest pressure, or dizziness.   Past Medical History:  Diagnosis Date  . Chronic systolic CHF (congestive heart failure) (HCC)    a. 2D ECHO (03/06/15) EF 20-25%, diffuse HK. Asc aortic diameter: 40mm. Mild LA dilation, mild RV dilation. Mild RV systolic dysfunction   b. LifeVest placed on 02/2015 admissoin   . Coronary artery disease    a. 70% LAD lesion  . Elevated TSH   . ETOH abuse   . Hypertension   . Morbid obesity (HCC)    a. BMI ~46  . Persistent atrial fibrillation (HCC)    a. newly dx 02/2015 admission. s/p successful TEE/DCCV  b. on Eliquis  . Pre-diabetes    a. HgA1c 6.1    Past Surgical History:  Procedure Laterality Date  . CARDIAC CATHETERIZATION N/A 04/26/2015   Procedure: Left Heart Cath and Coronary Angiography;  Surgeon: Lyn Records, MD;  Location: [facility] INVASIVE CV LAB;  Service: Cardiovascular;  Laterality: N/A;  . CARDIOVERSION N/A 03/06/2015   Procedure: CARDIOVERSION;  Surgeon: Laurey Morale, MD;  Location: [facility] ENDOSCOPY;  Service: Cardiovascular;  Laterality: N/A;  . HERNIA REPAIR    . TEE WITHOUT CARDIOVERSION N/A 03/06/2015   Procedure: TRANSESOPHAGEAL ECHOCARDIOGRAM (TEE);  Surgeon: Laurey Morale, MD;  Location: [facility] ENDOSCOPY;  Service: Cardiovascular;  Laterality: N/A;  . TEE WITHOUT CARDIOVERSION N/A 05/02/2015   Procedure: TRANSESOPHAGEAL ECHOCARDIOGRAM (TEE);  Surgeon: Lewayne Bunting, MD;  Location: [facility] ENDOSCOPY;  Service: Cardiovascular;  Laterality: N/A;     Current Outpatient Medications  Medication Sig Dispense Refill  . acetaminophen (TYLENOL) 500 MG tablet Take 500 mg by mouth every 6 (six) hours as needed for mild pain or moderate pain.    Marland Kitchen ALPRAZolam (XANAX) 0.5 MG tablet Take 1 tablet (0.5 mg total) by mouth 3 (three) times daily as needed for anxiety. 20 tablet 0  . apixaban (ELIQUIS) 5 MG TABS tablet Take 1 tablet (5 mg total) by mouth 2 (two) times daily. 180 tablet 3  . atorvastatin (LIPITOR) 80 MG tablet Take 1 tablet (80 mg total) by mouth daily. 90 tablet 3  . carvedilol (COREG) 25 MG tablet Take 1 tablet (25 mg total) by mouth 2 (two) times daily. 180 tablet 3  . dofetilide (TIKOSYN)  500 MCG capsule Take 1 capsule (500 mcg total) by mouth 2 (two) times daily. 180 capsule 3  . furosemide (LASIX) 40 MG tablet Take 1 tablet (40 mg total) by mouth 2 (two) times daily. 180 tablet 3  . lisinopril (PRINIVIL,ZESTRIL) 10 MG tablet Take 1 tablet (10 mg total) by mouth daily. 90 tablet 3  . loratadine (CLARITIN) 10 MG tablet Take 10 mg by mouth daily.    . montelukast (SINGULAIR) 10 MG tablet Take 1 tablet (10 mg total) by mouth at bedtime. 90 tablet 3   No current facility-administered medications for this visit.     Allergies:   Patient has no known allergies.    Social History:  The  patient  reports that he quit smoking about 5 years ago. His smoking use included cigarettes. He has never used smokeless tobacco. He reports that he drinks alcohol. He reports that he does not use drugs.   Family History:  The patient's family history includes Breast cancer in his mother; Heart attack in his maternal grandfather and mother; Sleep apnea in his brother and brother; Uterine cancer in his sister.    ROS: All other systems are reviewed and negative. Unless otherwise mentioned in H&P    PHYSICAL EXAM: VS:  BP 140/84   Pulse 64   Ht 6\' 1"  (1.854 m)   Wt (!) 372 lb 6.4 oz (168.9 kg)   BMI 49.13 kg/m  , BMI Body mass index is 49.13 kg/m. GEN: Well nourished, well developed, in no acute distress morbidly obese HEENT: normal  Neck: no JVD, carotid bruits, or masses Cardiac: RRR; no murmurs, rubs, or gallops,no edema  Respiratory:  clear to auscultation bilaterally, normal work of breathing GI: soft, nontender, nondistended, + BS MS: no deformity or atrophy  Skin: warm and dry, no rash Neuro:  Strength and sensation are intact Psych: euthymic mood, full affect   EKG:  Not completed this office visit   Recent Labs: 10/21/2017: ALT 10; BUN 15; Creatinine, Ser 0.90; Hemoglobin 14.8; Magnesium 1.7; Platelets 243; Potassium 4.3; Sodium 140    Lipid Panel    Component Value Date/Time   CHOL 195 03/05/2015 0111   TRIG 78 03/05/2015 0111   HDL 45 03/05/2015 0111   CHOLHDL 4.3 03/05/2015 0111   VLDL 16 03/05/2015 0111   LDLCALC 134 (H) 03/05/2015 0111      Wt Readings from Last 3 Encounters:  06/06/18 (!) 372 lb 6.4 oz (168.9 kg)  04/26/18 (!) 376 lb (170.6 kg)  10/21/17 (!) 381 lb (172.8 kg)      Other studies Reviewed: Echocardiogram 2015/09/19.   Left ventricle: Poor acoustic windows limit study Cannot fully   visualize endocardium The cavity size was mildly dilated. Wall   thickness was increased in a pattern of mild LVH. Systolic   function was normal. The  estimated ejection fraction was in the   range of 55% to 60%. Doppler parameters are consistent with   abnormal left ventricular relaxation (grade 1 diastolic   dysfunction). - Left atrium: The atrium was mildly dilated.  ASSESSMENT AND PLAN:  1.  Paroxysmal atrial fibrillation: Status post TEE cardioversion September 2016 on Tikosyn.  The patient has no complaints of rapid heart rhythm, palpitations, or dizziness.  The patient is extremely depressed at this time and is not as verbal concerning any symptoms.  I have had to prompt him to answer questions concerning any cardiac issues.  He is medically compliant.  He is given refills on all  medications.  This does include Eliquis 5 mg twice daily.  2.  Hypertension: Slightly elevated today.  Had been low on previous visits.  We will continue to monitor.  3.  Hypercholesterolemia: Continue statin therapy.  4.  Obstructive sleep apnea: Intolerant of CPAP.   Current medicines are reviewed at length with the patient today.    Labs/ tests ordered today include: None  Bettey Mare. Liborio Nixon, ANP, AACC   06/06/2018 11:43 AM    [facility] [address] [place], [place] [address] Phone: [phone]; Fax: [phone]

## 2018-06-06 ENCOUNTER — Ambulatory Visit (INDEPENDENT_AMBULATORY_CARE_PROVIDER_SITE_OTHER): Payer: BLUE CROSS/BLUE SHIELD | Admitting: Adult Health

## 2018-06-06 ENCOUNTER — Encounter: Payer: Self-pay | Admitting: Adult Health

## 2018-06-06 VITALS — BP 140/84 | HR 64 | Ht 73.0 in | Wt 372.4 lb

## 2018-06-06 DIAGNOSIS — I480 Paroxysmal atrial fibrillation: Secondary | ICD-10-CM | POA: Diagnosis not present

## 2018-06-06 DIAGNOSIS — E7800 Pure hypercholesterolemia, unspecified: Secondary | ICD-10-CM | POA: Diagnosis not present

## 2018-06-06 DIAGNOSIS — I10 Essential (primary) hypertension: Secondary | ICD-10-CM

## 2018-06-06 MED ORDER — APIXABAN 5 MG PO TABS: 5.0000 mg | ORAL_TABLET | Freq: Two times a day (BID) | ORAL | 3 refills | Status: DC

## 2018-06-06 MED ORDER — ATORVASTATIN CALCIUM 80 MG PO TABS: 80.0000 mg | ORAL_TABLET | Freq: Every day | ORAL | 3 refills | Status: DC

## 2018-06-06 MED ORDER — DOFETILIDE 500 MCG PO CAPS: 500.0000 ug | ORAL_CAPSULE | Freq: Two times a day (BID) | ORAL | 3 refills | Status: DC

## 2018-06-06 MED ORDER — CARVEDILOL 25 MG PO TABS: 25.0000 mg | ORAL_TABLET | Freq: Two times a day (BID) | ORAL | 3 refills | Status: DC

## 2018-06-06 MED ORDER — LISINOPRIL 10 MG PO TABS: 10.0000 mg | ORAL_TABLET | Freq: Every day | ORAL | 3 refills | Status: DC

## 2018-06-06 MED ORDER — FUROSEMIDE 40 MG PO TABS: 40.0000 mg | ORAL_TABLET | Freq: Two times a day (BID) | ORAL | 3 refills | Status: DC

## 2018-06-06 NOTE — Patient Instructions (Signed)
Medication Instructions:  NO CHANGES- Your physician recommends that you continue on your current medications as directed. Please refer to the Current Medication list given to you today.  If you need a refill on your cardiac medications before your next appointment, please call your pharmacy.  Follow-Up: Your physician wants you to follow-up in: 6 MONTHS WITH DR HILTY -OR- KATHRYN LAWRENCE (NURSE PRACTIONIER), DNP,AACC IF PRIMARY CARDIOLOGIST IS UNAVAILABLE.   Thank you for choosing [facility] at [facility]!!

## 2018-07-12 DIAGNOSIS — M9903 Segmental and somatic dysfunction of lumbar region: Secondary | ICD-10-CM | POA: Diagnosis not present

## 2018-07-12 DIAGNOSIS — M9904 Segmental and somatic dysfunction of sacral region: Secondary | ICD-10-CM | POA: Diagnosis not present

## 2018-07-12 DIAGNOSIS — M62830 Muscle spasm of back: Secondary | ICD-10-CM | POA: Diagnosis not present

## 2018-07-12 DIAGNOSIS — M9901 Segmental and somatic dysfunction of cervical region: Secondary | ICD-10-CM | POA: Diagnosis not present

## 2018-07-13 DIAGNOSIS — M9903 Segmental and somatic dysfunction of lumbar region: Secondary | ICD-10-CM | POA: Diagnosis not present

## 2018-07-13 DIAGNOSIS — M9901 Segmental and somatic dysfunction of cervical region: Secondary | ICD-10-CM | POA: Diagnosis not present

## 2018-07-13 DIAGNOSIS — M9904 Segmental and somatic dysfunction of sacral region: Secondary | ICD-10-CM | POA: Diagnosis not present

## 2018-07-13 DIAGNOSIS — M62830 Muscle spasm of back: Secondary | ICD-10-CM | POA: Diagnosis not present

## 2018-07-18 DIAGNOSIS — M9903 Segmental and somatic dysfunction of lumbar region: Secondary | ICD-10-CM | POA: Diagnosis not present

## 2018-07-18 DIAGNOSIS — M9904 Segmental and somatic dysfunction of sacral region: Secondary | ICD-10-CM | POA: Diagnosis not present

## 2018-07-18 DIAGNOSIS — M62830 Muscle spasm of back: Secondary | ICD-10-CM | POA: Diagnosis not present

## 2018-07-18 DIAGNOSIS — M9901 Segmental and somatic dysfunction of cervical region: Secondary | ICD-10-CM | POA: Diagnosis not present

## 2018-07-19 DIAGNOSIS — M62830 Muscle spasm of back: Secondary | ICD-10-CM | POA: Diagnosis not present

## 2018-07-19 DIAGNOSIS — M9903 Segmental and somatic dysfunction of lumbar region: Secondary | ICD-10-CM | POA: Diagnosis not present

## 2018-07-19 DIAGNOSIS — M9904 Segmental and somatic dysfunction of sacral region: Secondary | ICD-10-CM | POA: Diagnosis not present

## 2018-07-19 DIAGNOSIS — M9901 Segmental and somatic dysfunction of cervical region: Secondary | ICD-10-CM | POA: Diagnosis not present

## 2018-07-20 DIAGNOSIS — M62830 Muscle spasm of back: Secondary | ICD-10-CM | POA: Diagnosis not present

## 2018-07-20 DIAGNOSIS — M9903 Segmental and somatic dysfunction of lumbar region: Secondary | ICD-10-CM | POA: Diagnosis not present

## 2018-07-20 DIAGNOSIS — M9904 Segmental and somatic dysfunction of sacral region: Secondary | ICD-10-CM | POA: Diagnosis not present

## 2018-07-20 DIAGNOSIS — M9901 Segmental and somatic dysfunction of cervical region: Secondary | ICD-10-CM | POA: Diagnosis not present

## 2018-07-22 DIAGNOSIS — M9904 Segmental and somatic dysfunction of sacral region: Secondary | ICD-10-CM | POA: Diagnosis not present

## 2018-07-22 DIAGNOSIS — M62830 Muscle spasm of back: Secondary | ICD-10-CM | POA: Diagnosis not present

## 2018-07-22 DIAGNOSIS — M9903 Segmental and somatic dysfunction of lumbar region: Secondary | ICD-10-CM | POA: Diagnosis not present

## 2018-07-22 DIAGNOSIS — M9901 Segmental and somatic dysfunction of cervical region: Secondary | ICD-10-CM | POA: Diagnosis not present

## 2018-07-27 DIAGNOSIS — M9903 Segmental and somatic dysfunction of lumbar region: Secondary | ICD-10-CM | POA: Diagnosis not present

## 2018-07-27 DIAGNOSIS — M62830 Muscle spasm of back: Secondary | ICD-10-CM | POA: Diagnosis not present

## 2018-07-27 DIAGNOSIS — M9904 Segmental and somatic dysfunction of sacral region: Secondary | ICD-10-CM | POA: Diagnosis not present

## 2018-07-27 DIAGNOSIS — M9901 Segmental and somatic dysfunction of cervical region: Secondary | ICD-10-CM | POA: Diagnosis not present

## 2018-07-29 DIAGNOSIS — M62830 Muscle spasm of back: Secondary | ICD-10-CM | POA: Diagnosis not present

## 2018-07-29 DIAGNOSIS — M9903 Segmental and somatic dysfunction of lumbar region: Secondary | ICD-10-CM | POA: Diagnosis not present

## 2018-07-29 DIAGNOSIS — M9904 Segmental and somatic dysfunction of sacral region: Secondary | ICD-10-CM | POA: Diagnosis not present

## 2018-07-29 DIAGNOSIS — M9901 Segmental and somatic dysfunction of cervical region: Secondary | ICD-10-CM | POA: Diagnosis not present

## 2018-10-27 ENCOUNTER — Other Ambulatory Visit: Payer: Self-pay | Admitting: *Deleted

## 2018-10-27 MED ORDER — ALPRAZOLAM 0.5 MG PO TABS: 0.5000 mg | ORAL_TABLET | Freq: Three times a day (TID) | ORAL | 0 refills | Status: DC | PRN

## 2018-10-27 NOTE — Telephone Encounter (Signed)
Pt left vm asking for a refill of his "anxiety med".

## 2018-11-25 ENCOUNTER — Telehealth: Payer: Self-pay | Admitting: Internal Medicine

## 2018-11-25 NOTE — Telephone Encounter (Signed)
Thanks .. I agree.  Dr. H 

## 2018-11-25 NOTE — Telephone Encounter (Signed)
Called patient, he states that his HR has been elevated, he does mention he is under some anxiety right now, and he did take an xanax 0.5 about 10 minutes ago, but his HR started at 125 and now it is 106, pt denies any SOB, CP or Chest discomfort, he states that his average HR is around 80's. Patient does have appointment with MD on 11/30/18. Patient advised to keep that appointment to be seen, and advised if he begins to have CP or Hr becomes more elevated to be evaluated by ER or Urgent Care.  Patient verbalized understanding, will send to MD and nurse to be aware.

## 2018-11-25 NOTE — Telephone Encounter (Signed)
STAT if HR is under 50 or over 120 (normal HR is 60-100 beats per minute)  1) What is your heart rate?  It was 125  And now it 70  2) Do you have a log of your heart rate readings (document readings)? no  3) Do you have any other symptoms? no

## 2018-11-30 ENCOUNTER — Ambulatory Visit (INDEPENDENT_AMBULATORY_CARE_PROVIDER_SITE_OTHER): Payer: BLUE CROSS/BLUE SHIELD | Admitting: Internal Medicine

## 2018-11-30 ENCOUNTER — Encounter: Payer: Self-pay | Admitting: Internal Medicine

## 2018-11-30 VITALS — BP 125/69 | HR 68 | Ht 73.0 in | Wt 377.0 lb

## 2018-11-30 DIAGNOSIS — F1010 Alcohol abuse, uncomplicated: Secondary | ICD-10-CM

## 2018-11-30 DIAGNOSIS — I480 Paroxysmal atrial fibrillation: Secondary | ICD-10-CM

## 2018-11-30 DIAGNOSIS — I428 Other cardiomyopathies: Secondary | ICD-10-CM

## 2018-11-30 DIAGNOSIS — Z79899 Other long term (current) drug therapy: Secondary | ICD-10-CM

## 2018-11-30 DIAGNOSIS — Z5181 Encounter for therapeutic drug level monitoring: Secondary | ICD-10-CM

## 2018-11-30 NOTE — Patient Instructions (Addendum)
Medication Instructions:  NO CHANGES If you need a refill on your cardiac medications before your next appointment, please call your pharmacy.   Lab work: CBC, CMET, BNP, Magnesium today If you have labs (blood work) drawn today and your tests are completely normal, you will receive your results only by: Marland Kitchen MyChart Message (if you have MyChart) OR . A paper copy in the mail If you have any lab test that is abnormal or we need to change your treatment, we will call you to review the results.  Follow-Up: At [facility], you and your health needs are our priority.  As part of our continuing mission to provide you with exceptional heart care, we have created designated Provider Care Teams.  These Care Teams include your primary Cardiologist (physician) and Advanced Practice Providers (APPs -  Physician Assistants and Nurse Practitioners) who all work together to provide you with the care you need, when you need it. You will need a follow up appointment in 6 months.  Please call our office 2 months in advance to schedule this appointment.  You may see Dr. Rennis Golden or one of the following Advanced Practice Providers on your designated Care Team: Azalee Course, [place] . Micah Flesher, PA-C  Any Other Special Instructions Will Be Listed Below (If Applicable).  [facility] [address] [place], [place] [address] Phone: [phone]  [facility] in [place], [place]
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
## 2018-12-01 LAB — COMPREHENSIVE METABOLIC PANEL
A/G RATIO: 1.8 (ref 1.2–2.2)
ALBUMIN: 4 g/dL (ref 3.6–4.8)
ALK PHOS: 76 IU/L (ref 39–117)
ALT: 11 IU/L (ref 0–44)
AST: 14 IU/L (ref 0–40)
BILIRUBIN TOTAL: 0.7 mg/dL (ref 0.0–1.2)
BUN / CREAT RATIO: 20 (ref 10–24)
BUN: 15 mg/dL (ref 8–27)
CHLORIDE: 95 mmol/L — AB (ref 96–106)
CO2: 31 mmol/L — ABNORMAL HIGH (ref 20–29)
Calcium: 9.2 mg/dL (ref 8.6–10.2)
Creatinine, Ser: 0.74 mg/dL — ABNORMAL LOW (ref 0.76–1.27)
GFR calc Af Amer: 115 mL/min/{1.73_m2} (ref >59)
GFR calc non Af Amer: 100 mL/min/{1.73_m2} (ref >59)
GLUCOSE: 196 mg/dL — AB (ref 65–99)
Globulin, Total: 2.2 g/dL (ref 1.5–4.5)
POTASSIUM: 4.3 mmol/L (ref 3.5–5.2)
Sodium: 141 mmol/L (ref 134–144)
Total Protein: 6.2 g/dL (ref 6.0–8.5)

## 2018-12-01 LAB — CBC
HEMOGLOBIN: 15.5 g/dL (ref 13.0–17.7)
Hematocrit: 45.3 % (ref 37.5–51.0)
MCH: 30.1 pg (ref 26.6–33.0)
MCHC: 34.2 g/dL (ref 31.5–35.7)
MCV: 88 fL (ref 79–97)
Platelets: 229 10*3/uL (ref 150–450)
RBC: 5.15 x10E6/uL (ref 4.14–5.80)
RDW: 12 % — ABNORMAL LOW (ref 12.3–15.4)
WBC: 8.5 10*3/uL (ref 3.4–10.8)

## 2018-12-01 LAB — MAGNESIUM: Magnesium: 1.8 mg/dL (ref 1.6–2.3)

## 2018-12-01 LAB — PRO B NATRIURETIC PEPTIDE: NT-Pro BNP: 352 pg/mL — ABNORMAL HIGH (ref 0–210)

## 2019-01-19 ENCOUNTER — Other Ambulatory Visit: Payer: Self-pay | Admitting: Sports Medicine

## 2019-02-02 ENCOUNTER — Telehealth: Payer: Self-pay

## 2019-02-02 NOTE — Telephone Encounter (Signed)
Pt called concerned that his pulse is running around 50 while resting. Pt states he does feel a little "off" and more tired than usual, but no other complaints.   Pt has not been seen since May 2019.. I have advised pt to make appt with Dr T. Pt unable to be seen today due to work, has been scheduled for tomorrow.

## 2019-02-03 ENCOUNTER — Encounter: Payer: Self-pay | Admitting: Sports Medicine

## 2019-02-03 ENCOUNTER — Ambulatory Visit (INDEPENDENT_AMBULATORY_CARE_PROVIDER_SITE_OTHER): Payer: BLUE CROSS/BLUE SHIELD

## 2019-02-03 ENCOUNTER — Ambulatory Visit (INDEPENDENT_AMBULATORY_CARE_PROVIDER_SITE_OTHER): Payer: BLUE CROSS/BLUE SHIELD | Admitting: Sports Medicine

## 2019-02-03 DIAGNOSIS — R05 Cough: Secondary | ICD-10-CM

## 2019-02-03 DIAGNOSIS — R0602 Shortness of breath: Secondary | ICD-10-CM

## 2019-02-03 DIAGNOSIS — R059 Cough, unspecified: Secondary | ICD-10-CM

## 2019-02-03 DIAGNOSIS — I480 Paroxysmal atrial fibrillation: Secondary | ICD-10-CM

## 2019-02-03 DIAGNOSIS — K219 Gastro-esophageal reflux disease without esophagitis: Secondary | ICD-10-CM | POA: Diagnosis not present

## 2019-02-03 DIAGNOSIS — F4321 Adjustment disorder with depressed mood: Secondary | ICD-10-CM | POA: Diagnosis not present

## 2019-02-03 MED ORDER — MONTELUKAST SODIUM 10 MG PO TABS: 10.0000 mg | ORAL_TABLET | Freq: Every day | ORAL | 3 refills | Status: DC

## 2019-02-03 MED ORDER — PANTOPRAZOLE SODIUM 40 MG PO TBEC: 40.0000 mg | DELAYED_RELEASE_TABLET | Freq: Every day | ORAL | 3 refills | Status: DC

## 2019-02-03 MED ORDER — ESCITALOPRAM OXALATE 5 MG PO TABS: 5.0000 mg | ORAL_TABLET | Freq: Every day | ORAL | 3 refills | Status: DC

## 2019-02-03 MED ORDER — LORATADINE 10 MG PO TABS: 10.0000 mg | ORAL_TABLET | Freq: Every day | ORAL | 2 refills | Status: DC

## 2019-02-03 NOTE — Assessment & Plan Note (Signed)
It has now been about 9 months since his wife passed. He has been grieving, and his alcohol intake has increased considerably. We were historically doing a bit of alprazolam, I do think we need to add behavioral therapy and an SSRI. Adding Escitalopram 5 mg. Adding behavioral therapy. Return to see me in 1 month.

## 2019-02-03 NOTE — Progress Notes (Signed)
Subjective:    CC: Feeling fatigued  HPI: Arthur Church is a very pleasant 63 year old male with history of alcoholic cardiomyopathy, EF as low as 15% but improved to 55%.  Unfortunately he has noted some episodes of bradycardia, fatigue.  On further questioning he has been more depressed, grieving more and has increased his alcohol intake up to 12-15 shots per night.  He decreased this and symptoms improved considerably.  He does feel as though this is a coping mechanism for his grief, and understands it we need to do a better job controlling his mood symptoms.  No suicidal or homicidal ideation.  Symptoms are moderate, persistent.  In addition he reports excessive throat clearing, sour brash.  No melena, hematochezia, hematemesis.  I reviewed the past medical history, family history, social history, surgical history, and allergies today and no changes were needed.  Please see the problem list section below in epic for further details.  Past Medical History: Past Medical History:  Diagnosis Date  . Chronic systolic CHF (congestive heart failure) (HCC)    a. 2D ECHO (03/06/15) EF 20-25%, diffuse HK. Asc aortic diameter: 78mm. Mild LA dilation, mild RV dilation. Mild RV systolic dysfunction   b. LifeVest placed on 02/2015 admissoin   . Coronary artery disease    a. 70% LAD lesion  . Elevated TSH   . ETOH abuse   . Hypertension   . Morbid obesity (HCC)    a. BMI ~46  . Persistent atrial fibrillation    a. newly dx 02/2015 admission. s/p successful TEE/DCCV  b. on Eliquis  . Pre-diabetes    a. HgA1c 6.1   Past Surgical History: Past Surgical History:  Procedure Laterality Date  . CARDIAC CATHETERIZATION N/A 04/26/2015   Procedure: Left Heart Cath and Coronary Angiography;  Surgeon: Lyn Records, MD;  Location: [facility] INVASIVE CV LAB;  Service: Cardiovascular;  Laterality: N/A;  . CARDIOVERSION N/A 03/06/2015   Procedure: CARDIOVERSION;  Surgeon: Laurey Morale, MD;  Location: [facility] ENDOSCOPY;   Service: Cardiovascular;  Laterality: N/A;  . HERNIA REPAIR    . TEE WITHOUT CARDIOVERSION N/A 03/06/2015   Procedure: TRANSESOPHAGEAL ECHOCARDIOGRAM (TEE);  Surgeon: Laurey Morale, MD;  Location: [facility] ENDOSCOPY;  Service: Cardiovascular;  Laterality: N/A;  . TEE WITHOUT CARDIOVERSION N/A 05/02/2015   Procedure: TRANSESOPHAGEAL ECHOCARDIOGRAM (TEE);  Surgeon: Lewayne Bunting, MD;  Location: [facility] ENDOSCOPY;  Service: Cardiovascular;  Laterality: N/A;   Social History: Social History   Socioeconomic History  . Marital status: Married    Spouse name: Not on file  . Number of children: Not on file  . Years of education: Not on file  . Highest education level: Not on file  Occupational History  . Not on file  Social Needs  . Financial resource strain: Not on file  . Food insecurity:    Worry: Not on file    Inability: Not on file  . Transportation needs:    Medical: Not on file    Non-medical: Not on file  Tobacco Use  . Smoking status: Former Smoker    Types: Cigarettes    Last attempt to quit: 01/02/2013    Years since quitting: 6.0  . Smokeless tobacco: Never Used  . Tobacco comment: smokes about a week  Substance and Sexual Activity  . Alcohol use: Yes    Alcohol/week: 0.0 standard drinks  . Drug use: No  . Sexual activity: Not Currently  Lifestyle  . Physical activity:    Days per week: Not on  file    Minutes per session: Not on file  . Stress: Not on file  Relationships  . Social connections:    Talks on phone: Not on file    Gets together: Not on file    Attends religious service: Not on file    Active member of club or organization: Not on file    Attends meetings of clubs or organizations: Not on file    Relationship status: Not on file  Other Topics Concern  . Not on file  Social History Narrative  . Not on file   Family History: Family History  Problem Relation Age of Onset  . Uterine cancer Sister   . Heart attack Mother   . Breast cancer Mother   .  Sleep apnea Brother   . Heart attack Maternal Grandfather   . Sleep apnea Brother    Allergies: No Known Allergies Medications: See med rec.  Review of Systems: No fevers, chills, night sweats, weight loss, chest pain, or shortness of breath.   Objective:    General: Well Developed, well nourished, and in no acute distress.  Neuro: Alert and oriented x3, extra-ocular muscles intact, sensation grossly intact.  HEENT: Normocephalic, atraumatic, pupils equal round reactive to light, neck supple, no masses, no lymphadenopathy, thyroid nonpalpable.  Skin: Warm and dry, no rashes. Cardiac: Regular rate and rhythm, no murmurs rubs or gallops, no lower extremity edema.  Respiratory: Clear to auscultation, slightly decreased air movement on the left. Not using accessory muscles, speaking in full sentences.  Impression and Recommendations:    Grieving It has now been about 9 months since his wife passed. He has been grieving, and his alcohol intake has increased considerably. We were historically doing a bit of alprazolam, I do think we need to add behavioral therapy and an SSRI. Adding Escitalopram 5 mg. Adding behavioral therapy. Return to see me in 1 month.  LPRD (laryngopharyngeal reflux disease) Excessive throat clearing, occasional trouble catching his breath. Adding Protonix in the evening. I am going to get a chest x-ray, he did have a bit of decreased air movement on the left. I do not think this is from his heart failure, he has lost about 7 pounds, no PND orthopnea.   PAF (paroxysmal atrial fibrillation) (HCC) Continues with dofetilide, carvedilol. Vitals are good today, he was complaining of some bradycardia and fatigue, on further questioning he had increased back to 12-15 shots of alcohol a night.  ___________________________________________ Ihor Austin. Benjamin Stain, M.D., ABFM., CAQSM. Primary Care and Sports Medicine [facility]  Adjunct  Professor of Family Medicine  [employer]

## 2019-02-03 NOTE — Assessment & Plan Note (Signed)
Excessive throat clearing, occasional trouble catching his breath. Adding Protonix in the evening. I am going to get a chest x-ray, he did have a bit of decreased air movement on the left. I do not think this is from his heart failure, he has lost about 7 pounds, no PND orthopnea.

## 2019-02-03 NOTE — Assessment & Plan Note (Signed)
Continues with dofetilide, carvedilol. Vitals are good today, he was complaining of some bradycardia and fatigue, on further questioning he had increased back to 12-15 shots of alcohol a night.

## 2019-02-08 DIAGNOSIS — S8001XA Contusion of right knee, initial encounter: Secondary | ICD-10-CM | POA: Diagnosis not present

## 2019-02-08 DIAGNOSIS — M9906 Segmental and somatic dysfunction of lower extremity: Secondary | ICD-10-CM | POA: Diagnosis not present

## 2019-02-09 ENCOUNTER — Telehealth: Payer: Self-pay | Admitting: Internal Medicine

## 2019-02-09 DIAGNOSIS — M9901 Segmental and somatic dysfunction of cervical region: Secondary | ICD-10-CM | POA: Diagnosis not present

## 2019-02-09 DIAGNOSIS — S8001XA Contusion of right knee, initial encounter: Secondary | ICD-10-CM | POA: Diagnosis not present

## 2019-02-09 DIAGNOSIS — M9906 Segmental and somatic dysfunction of lower extremity: Secondary | ICD-10-CM | POA: Diagnosis not present

## 2019-02-09 DIAGNOSIS — M9902 Segmental and somatic dysfunction of thoracic region: Secondary | ICD-10-CM | POA: Diagnosis not present

## 2019-02-09 NOTE — Telephone Encounter (Signed)
Sorry to hear that - not much more for me to do - hopefully the lung will re-expand.  Dr. Rexene Edison

## 2019-02-09 NOTE — Telephone Encounter (Signed)
Noted  

## 2019-02-09 NOTE — Telephone Encounter (Signed)
  Patient is calling because his PCP Dr took a chest xray and told him he has a partially collapsed lung. Mr Arthur Church wants to make sure that [name] is aware of this. Patient states that his PCP gave him medication and told him to suck through a straw to strengthen his lung. Patient would like a call back to let him know if Hilty needs to see him or not.

## 2019-02-09 NOTE — Telephone Encounter (Signed)
Spoke with pt. Pt sts that he wanted to make sure that Dr.Hilty was updated that he is currently being treated for a collapsed lung. Advised patient that Dr.Hilty would not treat his lung issue, and he is currently under his primary care physician's care. An appointment with Dr.Hilty would not be indicated if the patient is not having any cardiac symptoms. Patient denies any cardiac symptoms at this time. Advised patient that I will forward an update to Dr.Hilty and we will give him a call back if he has any recommendations. Patient voiced appreciation for the call back.

## 2019-02-10 DIAGNOSIS — M9902 Segmental and somatic dysfunction of thoracic region: Secondary | ICD-10-CM | POA: Diagnosis not present

## 2019-02-10 DIAGNOSIS — M9906 Segmental and somatic dysfunction of lower extremity: Secondary | ICD-10-CM | POA: Diagnosis not present

## 2019-02-10 DIAGNOSIS — S8001XA Contusion of right knee, initial encounter: Secondary | ICD-10-CM | POA: Diagnosis not present

## 2019-02-10 DIAGNOSIS — M9901 Segmental and somatic dysfunction of cervical region: Secondary | ICD-10-CM | POA: Diagnosis not present

## 2019-02-13 DIAGNOSIS — M9901 Segmental and somatic dysfunction of cervical region: Secondary | ICD-10-CM | POA: Diagnosis not present

## 2019-02-13 DIAGNOSIS — M9902 Segmental and somatic dysfunction of thoracic region: Secondary | ICD-10-CM | POA: Diagnosis not present

## 2019-02-13 DIAGNOSIS — S8001XA Contusion of right knee, initial encounter: Secondary | ICD-10-CM | POA: Diagnosis not present

## 2019-02-13 DIAGNOSIS — M9906 Segmental and somatic dysfunction of lower extremity: Secondary | ICD-10-CM | POA: Diagnosis not present

## 2019-02-27 ENCOUNTER — Other Ambulatory Visit: Payer: Self-pay | Admitting: *Deleted

## 2019-02-27 MED ORDER — APIXABAN 5 MG PO TABS: 5.0000 mg | ORAL_TABLET | Freq: Two times a day (BID) | ORAL | 3 refills | Status: DC

## 2019-02-27 NOTE — Telephone Encounter (Signed)
Pt is a 63 yr old male who saw Dr. Rennis Golden on 11/30/18, SCr on that date was 0.74. Weight on 02/03/19 was 167.8Kg. Will refill Eliqiuis 5mg  BID.

## 2019-02-28 ENCOUNTER — Other Ambulatory Visit: Payer: Self-pay | Admitting: *Deleted

## 2019-02-28 MED ORDER — FUROSEMIDE 40 MG PO TABS: 40.0000 mg | ORAL_TABLET | Freq: Two times a day (BID) | ORAL | 1 refills | Status: DC

## 2019-02-28 MED ORDER — ALPRAZOLAM 0.5 MG PO TABS: ORAL_TABLET | ORAL | 0 refills | Status: DC

## 2019-03-03 ENCOUNTER — Encounter: Payer: Self-pay | Admitting: Sports Medicine

## 2019-03-03 ENCOUNTER — Ambulatory Visit (INDEPENDENT_AMBULATORY_CARE_PROVIDER_SITE_OTHER): Payer: BLUE CROSS/BLUE SHIELD | Admitting: Sports Medicine

## 2019-03-03 ENCOUNTER — Other Ambulatory Visit: Payer: Self-pay

## 2019-03-03 DIAGNOSIS — G4733 Obstructive sleep apnea (adult) (pediatric): Secondary | ICD-10-CM

## 2019-03-03 DIAGNOSIS — R0902 Hypoxemia: Secondary | ICD-10-CM | POA: Diagnosis not present

## 2019-03-03 DIAGNOSIS — F4321 Adjustment disorder with depressed mood: Secondary | ICD-10-CM | POA: Diagnosis not present

## 2019-03-03 MED ORDER — SERTRALINE HCL 25 MG PO TABS: 25.0000 mg | ORAL_TABLET | Freq: Every day | ORAL | 2 refills | Status: DC

## 2019-03-03 MED ORDER — AMBULATORY NON FORMULARY MEDICATION: 0 refills | Status: DC

## 2019-03-03 NOTE — Assessment & Plan Note (Addendum)
Cough resolved with prednisone and doxycycline. X-ray did show some bibasilar scarring, atelectasis. He continues to be at approximately 88% at rest. I am adding a sleep study, I do think there is an element of obesity hypoventilation syndrome. Unfortunately I do think he is going to need some supplemental oxygen, he required 3 L nasal cannula for 92 to 93% saturation at rest. With walking he needed 4L. Referral placed to aero care, I would also like him to touch base with pulmonology.

## 2019-03-03 NOTE — Progress Notes (Signed)
Subjective:    CC: Multiple issues  HPI: Grieving/depression: Only took Lexapro for 2 days, stopped it, and never told us.  We are back to square 1.  No suicidal or homicidal ideation.  Sleep apnea: He has a machine but does not have the mask or tubing.  He has been experimenting with pressure settings, he is doing 4 to 5 cm H2O, but we do not have a sleep study to know what his nocturnal pressure need is.  Still has severe and significant excessive daytime sleepiness.  Hypoxia: Noted on physical exam today.  Coughing: Resolved after doxycycline, prednisone.  I reviewed the past medical history, family history, social history, surgical history, and allergies today and no changes were needed.  Please see the problem list section below in epic for further details.  Past Medical History: Past Medical History:  Diagnosis Date  . Chronic systolic CHF (congestive heart failure) (HCC)    a. 2D ECHO (03/06/15) EF 20-25%, diffuse HK. Asc aortic diameter: 67mm. Mild LA dilation, mild RV dilation. Mild RV systolic dysfunction   b. LifeVest placed on 02/2015 admissoin   . Coronary artery disease    a. 70% LAD lesion  . Elevated TSH   . ETOH abuse   . Hypertension   . Morbid obesity (HCC)    a. BMI ~46  . Persistent atrial fibrillation    a. newly dx 02/2015 admission. s/p successful TEE/DCCV  b. on Eliquis  . Pre-diabetes    a. HgA1c 6.1   Past Surgical History: Past Surgical History:  Procedure Laterality Date  . CARDIAC CATHETERIZATION N/A 04/26/2015   Procedure: Left Heart Cath and Coronary Angiography;  Surgeon: Lyn Records, MD;  Location: [facility] INVASIVE CV LAB;  Service: Cardiovascular;  Laterality: N/A;  . CARDIOVERSION N/A 03/06/2015   Procedure: CARDIOVERSION;  Surgeon: Laurey Morale, MD;  Location: [facility] ENDOSCOPY;  Service: Cardiovascular;  Laterality: N/A;  . HERNIA REPAIR    . TEE WITHOUT CARDIOVERSION N/A 03/06/2015   Procedure: TRANSESOPHAGEAL ECHOCARDIOGRAM (TEE);  Surgeon:  Laurey Morale, MD;  Location: [facility] ENDOSCOPY;  Service: Cardiovascular;  Laterality: N/A;  . TEE WITHOUT CARDIOVERSION N/A 05/02/2015   Procedure: TRANSESOPHAGEAL ECHOCARDIOGRAM (TEE);  Surgeon: Lewayne Bunting, MD;  Location: [facility] ENDOSCOPY;  Service: Cardiovascular;  Laterality: N/A;   Social History: Social History   Socioeconomic History  . Marital status: Married    Spouse name: Not on file  . Number of children: Not on file  . Years of education: Not on file  . Highest education level: Not on file  Occupational History  . Not on file  Social Needs  . Financial resource strain: Not on file  . Food insecurity:    Worry: Not on file    Inability: Not on file  . Transportation needs:    Medical: Not on file    Non-medical: Not on file  Tobacco Use  . Smoking status: Former Smoker    Types: Cigarettes    Last attempt to quit: 01/02/2013    Years since quitting: 6.1  . Smokeless tobacco: Never Used  . Tobacco comment: smokes about a week  Substance and Sexual Activity  . Alcohol use: Yes    Alcohol/week: 0.0 standard drinks  . Drug use: No  . Sexual activity: Not Currently  Lifestyle  . Physical activity:    Days per week: Not on file    Minutes per session: Not on file  . Stress: Not on file  Relationships  . Social  connections:    Talks on phone: Not on file    Gets together: Not on file    Attends religious service: Not on file    Active member of club or organization: Not on file    Attends meetings of clubs or organizations: Not on file    Relationship status: Not on file  Other Topics Concern  . Not on file  Social History Narrative  . Not on file   Family History: Family History  Problem Relation Age of Onset  . Uterine cancer Sister   . Heart attack Mother   . Breast cancer Mother   . Sleep apnea Brother   . Heart attack Maternal Grandfather   . Sleep apnea Brother    Allergies: No Known Allergies Medications: See med rec.  Review of Systems: No  fevers, chills, night sweats, weight loss, chest pain, or shortness of breath.   Objective:    General: Well Developed, well nourished, and in no acute distress.  Neuro: Alert and oriented x3, extra-ocular muscles intact, sensation grossly intact.  HEENT: Normocephalic, atraumatic, pupils equal round reactive to light, neck supple, no masses, no lymphadenopathy, thyroid nonpalpable.  Skin: Warm and dry, no rashes. Cardiac: Regular rate and rhythm, no murmurs rubs or gallops, no lower extremity edema.  Respiratory: Clear to auscultation bilaterally. Not using accessory muscles, speaking in full sentences.  Needed 3 L O2 nasal cannula at rest, 4 L with ambulation.  Impression and Recommendations:    Grieving We started Escitalopram at the last visit. He did not take it more than 2 days. Developed some nausea. Switching to Zoloft. Return in a month for PHQ and GAD. I did discuss that he needs to come off of the alprazolam considering his alcohol intake.  Hypoxia Cough resolved with prednisone and doxycycline. X-ray did show some bibasilar scarring, atelectasis. He continues to be at approximately 88% at rest. I am adding a sleep study, I do think there is an element of obesity hypoventilation syndrome. Unfortunately I do think he is going to need some supplemental oxygen, he required 3 L nasal cannula for 92 to 93% saturation at rest. With walking he needed 4L. Referral placed to aero care, I would also like him to touch base with pulmonology.  Sleep apnea Obstructive sleep apnea. He is using his wife's CPAP machine but they accidentally threw away the mask and tubing. He do not know his pressure needs, but he is desaturating at night. Prescription for mask and tubing. Referral to [facility] sleep lab to find out appropriate pressure.   I spent 40 minutes with this patient, greater than 50% was face-to-face time counseling regarding the above diagnoses  ___________________________________________ Ihor Austin. Benjamin Stain, M.D., ABFM., CAQSM. Primary Care and Sports Medicine [facility]  Adjunct Professor of Family Medicine  [employer]

## 2019-03-03 NOTE — Assessment & Plan Note (Signed)
Obstructive sleep apnea. He is using his wife's CPAP machine but they accidentally threw away the mask and tubing. He do not know his pressure needs, but he is desaturating at night. Prescription for mask and tubing. Referral to [facility] sleep lab to find out appropriate pressure.

## 2019-03-03 NOTE — Assessment & Plan Note (Signed)
We started Escitalopram at the last visit. He did not take it more than 2 days. Developed some nausea. Switching to Zoloft. Return in a month for PHQ and GAD. I did discuss that he needs to come off of the alprazolam considering his alcohol intake.

## 2019-03-07 DIAGNOSIS — I509 Heart failure, unspecified: Secondary | ICD-10-CM | POA: Diagnosis not present

## 2019-03-07 DIAGNOSIS — I2510 Atherosclerotic heart disease of native coronary artery without angina pectoris: Secondary | ICD-10-CM | POA: Diagnosis not present

## 2019-03-07 DIAGNOSIS — I482 Chronic atrial fibrillation, unspecified: Secondary | ICD-10-CM | POA: Diagnosis not present

## 2019-03-17 ENCOUNTER — Other Ambulatory Visit: Payer: Self-pay | Admitting: Sports Medicine

## 2019-03-17 MED ORDER — AMBULATORY NON FORMULARY MEDICATION: 0 refills | Status: DC

## 2019-03-20 DIAGNOSIS — I482 Chronic atrial fibrillation, unspecified: Secondary | ICD-10-CM | POA: Diagnosis not present

## 2019-03-20 DIAGNOSIS — I5020 Unspecified systolic (congestive) heart failure: Secondary | ICD-10-CM | POA: Diagnosis not present

## 2019-03-20 DIAGNOSIS — I2510 Atherosclerotic heart disease of native coronary artery without angina pectoris: Secondary | ICD-10-CM | POA: Diagnosis not present

## 2019-03-21 ENCOUNTER — Other Ambulatory Visit: Payer: Self-pay

## 2019-03-21 ENCOUNTER — Telehealth: Payer: Self-pay | Admitting: Internal Medicine

## 2019-03-21 ENCOUNTER — Ambulatory Visit (INDEPENDENT_AMBULATORY_CARE_PROVIDER_SITE_OTHER): Payer: BLUE CROSS/BLUE SHIELD | Admitting: Cardiovascular Disease

## 2019-03-21 ENCOUNTER — Ambulatory Visit
Admission: RE | Admit: 2019-03-21 | Discharge: 2019-03-21 | Disposition: A | Discharge status: Home / Self Care | Payer: BLUE CROSS/BLUE SHIELD | Source: Ambulatory Visit | Attending: Cardiovascular Disease | Admitting: Cardiovascular Disease

## 2019-03-21 ENCOUNTER — Encounter: Payer: Self-pay | Admitting: Cardiovascular Disease

## 2019-03-21 VITALS — BP 110/62 | HR 62 | Resp 12 | Ht 73.0 in | Wt 366.0 lb

## 2019-03-21 DIAGNOSIS — I428 Other cardiomyopathies: Secondary | ICD-10-CM

## 2019-03-21 DIAGNOSIS — R0902 Hypoxemia: Secondary | ICD-10-CM | POA: Diagnosis not present

## 2019-03-21 DIAGNOSIS — I10 Essential (primary) hypertension: Secondary | ICD-10-CM

## 2019-03-21 DIAGNOSIS — I480 Paroxysmal atrial fibrillation: Secondary | ICD-10-CM | POA: Diagnosis not present

## 2019-03-21 DIAGNOSIS — Z79899 Other long term (current) drug therapy: Secondary | ICD-10-CM

## 2019-03-21 DIAGNOSIS — R0789 Other chest pain: Secondary | ICD-10-CM

## 2019-03-21 DIAGNOSIS — R0602 Shortness of breath: Secondary | ICD-10-CM | POA: Diagnosis not present

## 2019-03-21 DIAGNOSIS — F1010 Alcohol abuse, uncomplicated: Secondary | ICD-10-CM

## 2019-03-21 DIAGNOSIS — Z5181 Encounter for therapeutic drug level monitoring: Secondary | ICD-10-CM

## 2019-03-21 DIAGNOSIS — I2510 Atherosclerotic heart disease of native coronary artery without angina pectoris: Secondary | ICD-10-CM

## 2019-03-21 NOTE — Telephone Encounter (Signed)
New Message   STAT if patient feels like he/she is going to faint   1) Are you dizzy now? yes  2) Do you feel faint or have you passed out?feel like he is going to faint when he stands   Do you have any other symptoms? Tingle in the chest area.  3) Have you checked your HR and BP (record if available)? HR 100

## 2019-03-21 NOTE — Patient Instructions (Signed)
Medication Instructions:  Continue same medications If you need a refill on your cardiac medications before your next appointment, please call your pharmacy.   Lab work: Lab today ( bmet,bnp ) If you have labs (blood work) drawn today and your tests are completely normal, you will receive your results only by: Marland Kitchen MyChart Message (if you have MyChart) OR . A paper copy in the mail If you have any lab test that is abnormal or we need to change your treatment, we will call you to review the results.  Testing/Procedures: Chest xray to be done at [facility] on [facility]   Follow-Up: At [facility], you and your health needs are our priority.  As part of our continuing mission to provide you with exceptional heart care, we have created designated Provider Care Teams.  These Care Teams include your primary Cardiologist (physician) and Advanced Practice Providers (APPs -  Physician Assistants and Nurse Practitioners) who all work together to provide you with the care you need, when you need it. . Follow up with Dr.Hilty in June

## 2019-03-21 NOTE — Progress Notes (Signed)
Cardiology Office Note:    Date:  03/21/2019   ID:  Arthur Church, DOB September 29, 1956, MRN [id number]  PCP:  Monica Bectonhekkekandam, Thomas J, MD  Cardiologist:  No primary care provider on file.  Electrophysiologist:  None   Referring MD: Monica Bectonhekkekandam, Thomas J,*   Chief Complaint  Patient presents with   Shortness of Breath  Chest pain  History of Present Illness:    Arthur Church is a 63 y.o. male with a hx of persistent atrial fibrillation currently in sinus rhythm on dofetilide therapy, nonischemic cardiomyopathy, morbid obesity and a history of heavy alcohol use.  Over the past couple of weeks he has experienced some chest discomfort and asked to be seen in the office today.  He describes 2 different kinds of discomfort.  More frequently he has very brief sharp stabbing discomfort like electrical shocks in his left shoulder area radiating down his left arm.  This sounds very much like neuralgia.  He does not have known cervical spine disorders.  He is most concerned about a deep dull discomfort that he sometimes has in his midsternal area.  This is less frequent and is not clearly associated with physical activity.  This has been present off and on for years.  The most worrisome finding is persistent hypoxemia.  He now has a pulse oximeter and typically his oxygen saturation is in the 88-90% range at rest on room air.  He underwent a sleep study and did not qualify for CPAP but is wearing oxygen with activity and when he sleeps at night.  He has NYHA functional class II-3 dyspnea.  Walking on level ground for about 100 feet will make him short of breath but he can generally take care of personal hygiene without becoming short of breath.  He has not had any leg swelling.  He is able to lie supine in bed without worsening of his dyspnea or chest discomfort.  He has a history of severely depressed left ventricular systolic function at one point as low as 20%.  It is possible this was related to  tachycardia cardiomyopathy or heavy alcohol use. His most recent echocardiogram actually showed resolution of the dysfunction with an ejection fraction of 55 to 60% in September 2016.  Cardiac catheterization and coronary angiography was performed in May 2016 and showed a calcified 70% stenosis in the mid LAD with otherwise mild luminal irregularities.  The lesion was felt to be difficult to treat percutaneously since it would require rotational atherectomy and it was felt that it was not the cause of his cardiomyopathy.  He tells me that he has completely stopped drinking for about 7 days now and it was easier to quit than he expected.  Past Medical History:  Diagnosis Date   Chronic systolic CHF (congestive heart failure) (HCC)    a. 2D ECHO (03/06/15) EF 20-25%, diffuse HK. Asc aortic diameter: 40mm. Mild LA dilation, mild RV dilation. Mild RV systolic dysfunction   b. LifeVest placed on 02/2015 admissoin    Coronary artery disease    a. 70% LAD lesion   Elevated TSH    ETOH abuse    Hypertension    Morbid obesity (HCC)    a. BMI ~46   Persistent atrial fibrillation    a. newly dx 02/2015 admission. s/p successful TEE/DCCV  b. on Eliquis   Pre-diabetes    a. HgA1c 6.1    Past Surgical History:  Procedure Laterality Date   CARDIAC CATHETERIZATION N/A 04/26/2015   Procedure:  Left Heart Cath and Coronary Angiography;  Surgeon: Lyn Records, MD;  Location: [facility] INVASIVE CV LAB;  Service: Cardiovascular;  Laterality: N/A;   CARDIOVERSION N/A 03/06/2015   Procedure: CARDIOVERSION;  Surgeon: Laurey Morale, MD;  Location: [facility] ENDOSCOPY;  Service: Cardiovascular;  Laterality: N/A;   HERNIA REPAIR     TEE WITHOUT CARDIOVERSION N/A 03/06/2015   Procedure: TRANSESOPHAGEAL ECHOCARDIOGRAM (TEE);  Surgeon: Laurey Morale, MD;  Location: [facility] ENDOSCOPY;  Service: Cardiovascular;  Laterality: N/A;   TEE WITHOUT CARDIOVERSION N/A 05/02/2015   Procedure: TRANSESOPHAGEAL ECHOCARDIOGRAM (TEE);   Surgeon: Lewayne Bunting, MD;  Location: [facility] ENDOSCOPY;  Service: Cardiovascular;  Laterality: N/A;    Current Medications: Current Meds  Medication Sig   acetaminophen (TYLENOL) 500 MG tablet Take 500 mg by mouth every 6 (six) hours as needed for mild pain or moderate pain.   ALPRAZolam (XANAX) 0.5 MG tablet TAKE 1 TABLET BY MOUTH THREE TIMES DAILYAS NEEDED FOR ANXIETY   AMBULATORY NON FORMULARY MEDICATION CPAP mask and tubing   AMBULATORY NON FORMULARY MEDICATION Portable oxygen concentrator, 3 L [place].  Please fax to [facility]   apixaban (ELIQUIS) 5 MG TABS tablet Take 1 tablet (5 mg total) by mouth 2 (two) times daily.   atorvastatin (LIPITOR) 80 MG tablet Take 1 tablet (80 mg total) by mouth daily.   carvedilol (COREG) 25 MG tablet Take 1 tablet (25 mg total) by mouth 2 (two) times daily.   dofetilide (TIKOSYN) 500 MCG capsule Take 1 capsule (500 mcg total) by mouth 2 (two) times daily.   furosemide (LASIX) 40 MG tablet Take 1 tablet (40 mg total) by mouth 2 (two) times daily.   lisinopril (PRINIVIL,ZESTRIL) 10 MG tablet Take 1 tablet (10 mg total) by mouth daily.   loratadine (CLARITIN) 10 MG tablet Take 1 tablet (10 mg total) by mouth daily.   montelukast (SINGULAIR) 10 MG tablet Take 1 tablet (10 mg total) by mouth at bedtime.   pantoprazole (PROTONIX) 40 MG tablet Take 1 tablet (40 mg total) by mouth daily.   sertraline (ZOLOFT) 25 MG tablet Take 1 tablet (25 mg total) by mouth daily.     Allergies:   Patient has no known allergies.   Social History   Socioeconomic History   Marital status: Married    Spouse name: Not on file   Number of children: Not on file   Years of education: Not on file   Highest education level: Not on file  Occupational History   Not on file  Social Needs   Financial resource strain: Not on file   Food insecurity:    Worry: Not on file    Inability: Not on file   Transportation needs:    Medical: Not on file     Non-medical: Not on file  Tobacco Use   Smoking status: Former Smoker    Types: Cigarettes    Last attempt to quit: 01/02/2013    Years since quitting: 6.2   Smokeless tobacco: Never Used   Tobacco comment: smokes about a week  Substance and Sexual Activity   Alcohol use: Yes    Alcohol/week: 0.0 standard drinks   Drug use: No   Sexual activity: Not Currently  Lifestyle   Physical activity:    Days per week: Not on file    Minutes per session: Not on file   Stress: Not on file  Relationships   Social connections:    Talks on phone: Not on file  Gets together: Not on file    Attends religious service: Not on file    Active member of club or organization: Not on file    Attends meetings of clubs or organizations: Not on file    Relationship status: Not on file  Other Topics Concern   Not on file  Social History Narrative   Not on file     Family History: The patient's family history includes Breast cancer in his mother; Heart attack in his maternal grandfather and mother; Sleep apnea in his brother and brother; Uterine cancer in his sister.  ROS:   Please see the history of present illness.     All other systems reviewed and are negative.  EKGs/Labs/Other Studies Reviewed:    The following studies were reviewed today: Chest x-ray performed by his PCP in February showed mild atelectasis.  EKG:  EKG is  ordered today.  The ekg ordered today demonstrates sinus rhythm with a single PVC, minor nonspecific T wave flattening, no ischemic changes.  QT 460 ms on dofetilide.  Recent Labs: 11/30/2018: ALT 11; BUN 15; Creatinine, Ser 0.74; Hemoglobin 15.5; Magnesium 1.8; NT-Pro BNP 352; Platelets 229; Potassium 4.3; Sodium 141  Recent Lipid Panel    Component Value Date/Time   CHOL 195 03/05/2015 0111   TRIG 78 03/05/2015 0111   HDL 45 03/05/2015 0111   CHOLHDL 4.3 03/05/2015 0111   VLDL 16 03/05/2015 0111   LDLCALC 134 (H) 03/05/2015 0111    Physical Exam:      VS:  BP 110/62 (BP Location: Left Arm, Patient Position: Sitting, Cuff Size: Large)    Pulse 62    Resp 12    Ht 6\' 1"  (1.854 m)    Wt (!) 366 lb (166 kg)    SpO2 93%    BMI 48.29 kg/m     Wt Readings from Last 3 Encounters:  03/21/19 (!) 366 lb (166 kg)  03/03/19 (!) 372 lb (168.7 kg)  02/03/19 (!) 370 lb (167.8 kg)     GEN: Morbidly obese, well nourished, well developed in no acute distress.  He does not appear cyanotic. HEENT: Normal NECK: No JVD; No carotid bruits LYMPHATICS: No lymphadenopathy CARDIAC: RRR with infrequent ectopic beats, no murmurs, rubs, gallops RESPIRATORY:  Clear to auscultation without rales, wheezing or rhonchi  ABDOMEN: Soft, non-tender, non-distended MUSCULOSKELETAL:  No edema; No deformity  SKIN: Warm and dry NEUROLOGIC:  Alert and oriented x 3 PSYCHIATRIC:  Normal affect   ASSESSMENT:    1. Atypical chest pain   2. Non-ischemic cardiomyopathy (HCC)   3. Hypoxia   4. PAF (paroxysmal atrial fibrillation) (HCC)   5. Encounter for monitoring dofetilide therapy   6. Coronary arteriosclerosis in native artery   7. Alcohol abuse   8. Essential hypertension    PLAN:    In order of problems listed above:  1. Chest pain: The symptoms that concerned him the most (sharp lancinating pain in the left shoulder and left arm) do not sound consistent with coronary insufficiency.  They are quite consistent with neurology and I wonder whether he may have cervical spine disease.  He does have a dull retrosternal ache but this is infrequent, brief and mild.  It is not clearly exertional.  He has known LAD stenosis but does not sound like he is describing an acute coronary syndrome.  His ECG shows low risk findings.  Further evaluation is not planned at this point 2. History of cardiomyopathy: The rapid improvement in left ventricular  systolic function with control of his arrhythmia suggest that this was primarily tachycardia related cardiomyopathy. Unclear whether  there was also a component of alcohol abuse, but he has recently stopped drinking altogether.  His physical exam is challenging due to morbid obesity, but I do not find any signs of hypervolemia on his exam.  His weight has actually gone down 6 pounds since his last office appointment.  Reviewed the chest x-ray today: it does not show any signs of heart failure.  A BNP and basic metabolic panel are pending at this time. 3. Hypoxia: This is the most worrisome finding.  He had evidence of some atelectasis on his chest x-ray a few weeks ago, but today's x-ray does not seem to show this.  His chest x-ray does not show findings though suggesting bacterial or viral pneumonia.  He has not had fever, cough, known sick contacts.  Leading possibilities include COPD (he smoked until about 5 or 6 years ago) and obesity hypoventilation syndrome.  The basic metabolic panel might help confirm that if it shows evidence of chronic metabolic alkalosis.  Note that labs in December 2019 did show an elevated venous CO2 of 31. 4. Paroxysmal atrial fibrillation: Currently in sinus rhythm on dofetilide.  Compliant with anticoagulation. 5. Dofetilide: Minimally prolonged QTC at 460 ms. 6. CAD: No stenosis in the mid LAD artery, calcified and would require rotational atherectomy.  I do not think his current chest pain syndrome is an expression of this.  It definitely would not explain hypoxemia. 7. Alcohol abuse: Recent marked increase in alcohol intake after his wife passed away, but he reports that he is completely stopped drinking over the last 7 days.  He has not had any symptoms of withdrawal. 8. HTN: controlled   Medication Adjustments/Labs and Tests Ordered: Current medicines are reviewed at length with the patient today.  Concerns regarding medicines are outlined above.  Orders Placed This Encounter  Procedures   DG Chest 2 View   Basic metabolic panel   B Nat Peptide   No orders of the defined types were placed  in this encounter.   Patient Instructions  Medication Instructions:  Continue same medications If you need a refill on your cardiac medications before your next appointment, please call your pharmacy.   Lab work: Lab today ( bmet,bnp ) If you have labs (blood work) drawn today and your tests are completely normal, you will receive your results only by:  MyChart Message (if you have MyChart) OR  A paper copy in the mail If you have any lab test that is abnormal or we need to change your treatment, we will call you to review the results.  Testing/Procedures: Chest xray to be done at [facility] on [facility]   Follow-Up: At [facility], you and your health needs are our priority.  As part of our continuing mission to provide you with exceptional heart care, we have created designated Provider Care Teams.  These Care Teams include your primary Cardiologist (physician) and Advanced Practice Providers (APPs -  Physician Assistants and Nurse Practitioners) who all work together to provide you with the care you need, when you need it.  Follow up with Dr.Hilty in June       Signed, Daine Croker, MD  03/21/2019 4:42 PM    [facility]

## 2019-03-21 NOTE — Telephone Encounter (Signed)
Received call from patient he stated for the past 2 weeks he has been having off and on left shoulder pain with pain radiating down left arm.Stated he has chest pain feels like heart burn.No pain at present.Stated he has sleep apnea unable to get cpap machine due to covid-19.He has been using O2  2L/min at night due to O2 sat 70%,when he uses O2 sat 90 %.Spoke to DOD Dr.Croitoru he advised needs to be seen with a ekg.Appointment scheduled with Dr.Croitoru this afternoon at 1:40 pm.

## 2019-03-22 LAB — BASIC METABOLIC PANEL
BUN/Creatinine Ratio: 20 (ref 10–24)
BUN: 12 mg/dL (ref 8–27)
CO2: 24 mmol/L (ref 20–29)
Calcium: 9.1 mg/dL (ref 8.6–10.2)
Chloride: 97 mmol/L (ref 96–106)
Creatinine, Ser: 0.6 mg/dL — ABNORMAL LOW (ref 0.76–1.27)
GFR calc Af Amer: 125 mL/min/{1.73_m2} (ref >59)
GFR calc non Af Amer: 109 mL/min/{1.73_m2} (ref >59)
Glucose: 190 mg/dL — ABNORMAL HIGH (ref 65–99)
Potassium: 4.2 mmol/L (ref 3.5–5.2)
Sodium: 138 mmol/L (ref 134–144)

## 2019-03-22 LAB — BRAIN NATRIURETIC PEPTIDE: BNP: 109.4 pg/mL — ABNORMAL HIGH (ref 0.0–100.0)

## 2019-03-31 ENCOUNTER — Ambulatory Visit: Payer: Self-pay | Admitting: Sports Medicine

## 2019-04-01 ENCOUNTER — Encounter: Payer: Self-pay | Admitting: Emergency Medicine

## 2019-04-01 ENCOUNTER — Emergency Department (INDEPENDENT_AMBULATORY_CARE_PROVIDER_SITE_OTHER)
Admission: EM | Admit: 2019-04-01 | Discharge: 2019-04-01 | Disposition: A | Discharge status: Home / Self Care | Payer: BLUE CROSS/BLUE SHIELD | Source: Home / Self Care

## 2019-04-01 DIAGNOSIS — R200 Anesthesia of skin: Secondary | ICD-10-CM

## 2019-04-01 DIAGNOSIS — R208 Other disturbances of skin sensation: Secondary | ICD-10-CM

## 2019-04-01 DIAGNOSIS — R739 Hyperglycemia, unspecified: Secondary | ICD-10-CM

## 2019-04-01 LAB — POCT FASTING CBG KUC MANUAL ENTRY: POCT Glucose (KUC): 159 mg/dL — AB (ref 70–99)

## 2019-04-01 NOTE — Discharge Instructions (Signed)
°  A blood test to check your average blood sugar over the last 3 months has been ordered.  You will be notified in about 2-3 days of those results. Please call to schedule a follow up appointment with your primary care provider for ongoing care and monitoring of your elevated blood sugar. His office is offering E-visit (telemedicine- phone or computer) at this time.  You have declined having an Ultrasound of your leg this weekend.  An order for an ultrasound of your Left leg has also been placed for you to return Monday to make sure you do not have a blood clot contributing to your numbness. It is very important to call 911 or go to the hospital right away if you develop leg pain, chest pain or shortness of breath.

## 2019-04-01 NOTE — ED Provider Notes (Signed)
[facility]    CSN: [id number] Arrival date & time: 04/01/19  1147     History   Chief Complaint Chief Complaint  Patient presents with  . Numbness    HPI Arthur Church is a 63 y.o. male.   HPI  Arthur Church is a 63 y.o. male presenting to UC with c/o intermittent Left foot swelling and numbness that started about 4 days ago.  He is followed by a cardiologist due to hx of CHF.  He called his cardiologist who recommended he be evaluated in UC for the numbness. He notes his leg swelling and numbness is worse at the end of a work day, swelling and numbness gradually improve after he removes his shoes but never completely resolves.  Pt is concerned it is due to his blood sugar. He has been advised he is pre-diabetic. He is not on metformin.  He does take lasix and Eliquis. No prior hx of blood clots.  Denies chest pain or SOB.     Past Medical History:  Diagnosis Date  . Chronic systolic CHF (congestive heart failure) (HCC)    a. 2D ECHO (03/06/15) EF 20-25%, diffuse HK. Asc aortic diameter: 40mm. Mild LA dilation, mild RV dilation. Mild RV systolic dysfunction   b. LifeVest placed on 02/2015 admissoin   . Coronary artery disease    a. 70% LAD lesion  . Elevated TSH   . ETOH abuse   . Hypertension   . Morbid obesity (HCC)    a. BMI ~46  . Persistent atrial fibrillation    a. newly dx 02/2015 admission. s/p successful TEE/DCCV  b. on Eliquis  . Pre-diabetes    a. HgA1c 6.1    Patient Active Problem List   Diagnosis Date Noted  . LPRD (laryngopharyngeal reflux disease) 02/03/2019  . Grieving 04/18/2018  . Sleep apnea 10/21/2017  . Annual physical exam 05/17/2017  . Right foot pain 05/17/2017  . Hypoxia 05/17/2017  . Varicose veins 09/06/2015  . Essential hypertension 06/28/2015  . PAF (paroxysmal atrial fibrillation) (HCC) 05/03/2015  . Non-ischemic cardiomyopathy (HCC) 05/03/2015  . Rosacea 04/03/2015  . Psoriasis 04/03/2015  . Morbid obesity (HCC)  03/05/2015  . ETOH abuse 03/05/2015    Past Surgical History:  Procedure Laterality Date  . CARDIAC CATHETERIZATION N/A 04/26/2015   Procedure: Left Heart Cath and Coronary Angiography;  Surgeon: Lyn RecordsHenry W Smith, MD;  [facility];  Service: Cardiovascular;  Laterality: N/A;  . CARDIOVERSION N/A 03/06/2015   Procedure: CARDIOVERSION;  Surgeon: Laurey Moralealton S McLean, MD;  [facility];  Service: Cardiovascular;  Laterality: N/A;  . HERNIA REPAIR    . TEE WITHOUT CARDIOVERSION N/A 03/06/2015   Procedure: TRANSESOPHAGEAL ECHOCARDIOGRAM (TEE);  Surgeon: Laurey Moralealton S McLean, MD;  [facility];  Service: Cardiovascular;  Laterality: N/A;  . TEE WITHOUT CARDIOVERSION N/A 05/02/2015   Procedure: TRANSESOPHAGEAL ECHOCARDIOGRAM (TEE);  Surgeon: Lewayne BuntingBrian S Crenshaw, MD;  [facility];  Service: Cardiovascular;  Laterality: N/A;       Home Medications    Prior to Admission medications   Medication Sig Start Date End Date Taking? Authorizing Provider  acetaminophen (TYLENOL) 500 MG tablet Take 500 mg by mouth every 6 (six) hours as needed for mild pain or moderate pain.    [provider]  ALPRAZolam Prudy Feeler(XANAX) 0.5 MG tablet TAKE 1 TABLET BY MOUTH THREE TIMES DAILYAS NEEDED FOR ANXIETY 02/28/19   Monica Bectonhekkekandam, Thomas J, MD  AMBULATORY NON FORMULARY MEDICATION CPAP mask and tubing  03/03/19   Monica Becton, MD  AMBULATORY NON FORMULARY MEDICATION Portable oxygen concentrator, 3 L [place].  Please fax to [facility] 03/17/19   Monica Becton, MD  apixaban (ELIQUIS) 5 MG TABS tablet Take 1 tablet (5 mg total) by mouth 2 (two) times daily. 02/27/19   Hilty, Lisette Abu, MD  atorvastatin (LIPITOR) 80 MG tablet Take 1 tablet (80 mg total) by mouth daily. 06/06/18   Jodelle Gross, NP  carvedilol (COREG) 25 MG tablet Take 1 tablet (25 mg total) by mouth 2 (two) times daily. 06/06/18   Jodelle Gross, NP  dofetilide (TIKOSYN) 500 MCG capsule Take 1 capsule (500  mcg total) by mouth 2 (two) times daily. 06/06/18   Jodelle Gross, NP  furosemide (LASIX) 40 MG tablet Take 1 tablet (40 mg total) by mouth 2 (two) times daily. 02/28/19   Hilty, Lisette Abu, MD  lisinopril (PRINIVIL,ZESTRIL) 10 MG tablet Take 1 tablet (10 mg total) by mouth daily. 06/06/18   Jodelle Gross, NP  loratadine (CLARITIN) 10 MG tablet Take 1 tablet (10 mg total) by mouth daily. 02/03/19 05/04/19  Monica Becton, MD  montelukast (SINGULAIR) 10 MG tablet Take 1 tablet (10 mg total) by mouth at bedtime. 02/03/19   Monica Becton, MD  pantoprazole (PROTONIX) 40 MG tablet Take 1 tablet (40 mg total) by mouth daily. 02/03/19   Monica Becton, MD  sertraline (ZOLOFT) 25 MG tablet Take 1 tablet (25 mg total) by mouth daily. 03/03/19   Monica Becton, MD    Family History Family History  Problem Relation Age of Onset  . Uterine cancer Sister   . Heart attack Mother   . Breast cancer Mother   . Sleep apnea Brother   . Heart attack Maternal Grandfather   . Sleep apnea Brother     Social History Social History   Tobacco Use  . Smoking status: Former Smoker    Types: Cigarettes    Last attempt to quit: 01/02/2013    Years since quitting: 6.2  . Smokeless tobacco: Never Used  . Tobacco comment: smokes about a week  Substance Use Topics  . Alcohol use: Yes    Alcohol/week: 0.0 standard drinks  . Drug use: No     Allergies   Patient has no known allergies.   Review of Systems Review of Systems  Respiratory: Negative for chest tightness and shortness of breath.   Cardiovascular: Positive for leg swelling (chronic). Negative for chest pain and palpitations.  Musculoskeletal: Negative for arthralgias, back pain, gait problem, joint swelling and myalgias.  Skin: Negative for color change, rash and wound.  Neurological: Positive for numbness. Negative for weakness.     Physical Exam Triage Vital Signs ED Triage Vitals [04/01/19 1226]  Enc  Vitals Group     BP 119/71     Pulse Rate 94     Resp      Temp 97.8 F (36.6 C)     Temp Source Oral     SpO2 92 %     Weight      Height      Head Circumference      Peak Flow      Pain Score      Pain Loc      Pain Edu?      Excl. in GC?    No data found.  Updated Vital Signs BP 119/71 (BP Location: Right Arm)   Pulse 94   Temp  97.8 F (36.6 C) (Oral)   Wt (!) 358 lb (162.4 kg)   SpO2 92%   BMI 47.23 kg/m   Visual Acuity Right Eye Distance:   Left Eye Distance:   Bilateral Distance:    Right Eye Near:   Left Eye Near:    Bilateral Near:     Physical Exam Vitals signs and nursing note reviewed.  Constitutional:      Appearance: Normal appearance. He is well-developed.  HENT:     Head: Normocephalic and atraumatic.  Neck:     Musculoskeletal: Normal range of motion.  Cardiovascular:     Rate and Rhythm: Normal rate.     Pulses:          Dorsalis pedis pulses are 1+ on the left side.       Posterior tibial pulses are 2+ on the left side.  Pulmonary:     Effort: Pulmonary effort is normal.  Musculoskeletal: Normal range of motion.     Right lower leg: Edema present.     Left lower leg: Edema present.     Comments: 1-2+ lower leg edema bilaterally Calves are soft, non-tender. Full ROM Left foot, no tenderness.  Skin:    General: Skin is warm and dry.     Capillary Refill: Capillary refill takes less than 2 seconds.     Comments: Left leg: skin in tact. No ecchymosis or erythema. Significant varicose veins in Left foot  Neurological:     Mental Status: He is alert and oriented to person, place, and time.     Comments: Left foot: subjective decreased sensation on mid plantar aspect of foot. Decreased sensation in great toe, second and third toe. Normal sensation in fourth and fifth toes.   Psychiatric:        Behavior: Behavior normal.      UC Treatments / Results  Labs (all labs ordered are listed, but only abnormal results are displayed) Labs  Reviewed  POCT FASTING CBG [facility] MANUAL ENTRY - Abnormal; Notable for the following components:      Result Value   POCT Glucose ([facility]) 159 (*)    All other components within normal limits  HEMOGLOBIN A1C    EKG None  Radiology No results found.  Procedures Procedures (including critical care time)  Medications Ordered in UC Medications - No data to display  Initial Impression / Assessment and Plan / UC Course  I have reviewed the triage vital signs and the nursing notes.  Pertinent labs & imaging results that were available during my care of the patient were reviewed by me and considered in my medical decision making (see chart for details).    Slight decreased pulse in Left dorsalis pedis  Cap refill < 3 seconds  Significant varicose veins noted in Left foot. Recommend ultrasound of Left leg to r/o DVT, however, low likelihood. Pt notes he must go to work today at [employer]. He would like to come back Monday. U/S order placed Encouraged f/u with PCP of HbgA1c performed today Discussed symptoms that warrant emergent care in the ED.   Final Clinical Impressions(s) / UC Diagnoses   Final diagnoses:  Elevated blood sugar  Numbness of left foot     Discharge Instructions      A blood test to check your average blood sugar over the last 3 months has been ordered.  You will be notified in about 2-3 days of those results. Please call to schedule a follow up appointment with your primary  care provider for ongoing care and monitoring of your elevated blood sugar. His office is offering E-visit (telemedicine- phone or computer) at this time.  You have declined having an Ultrasound of your leg this weekend.  An order for an ultrasound of your Left leg has also been placed for you to return Monday to make sure you do not have a blood clot contributing to your numbness. It is very important to call 911 or go to the hospital right away if you develop leg pain, chest pain or shortness of  breath.      ED Prescriptions    None     Controlled Substance Prescriptions [place] Controlled Substance Registry consulted? Not Applicable   Lurene Shadow, PA-C 04/01/19 1400

## 2019-04-01 NOTE — ED Triage Notes (Signed)
Pt c/o left foot numbness x4 days. Denies pain.

## 2019-04-03 ENCOUNTER — Other Ambulatory Visit: Payer: Self-pay

## 2019-04-03 ENCOUNTER — Ambulatory Visit (INDEPENDENT_AMBULATORY_CARE_PROVIDER_SITE_OTHER): Payer: BLUE CROSS/BLUE SHIELD | Admitting: Sports Medicine

## 2019-04-03 ENCOUNTER — Ambulatory Visit: Payer: BLUE CROSS/BLUE SHIELD

## 2019-04-03 ENCOUNTER — Telehealth: Payer: Self-pay

## 2019-04-03 ENCOUNTER — Encounter: Payer: Self-pay | Admitting: Sports Medicine

## 2019-04-03 DIAGNOSIS — F4321 Adjustment disorder with depressed mood: Secondary | ICD-10-CM | POA: Diagnosis not present

## 2019-04-03 DIAGNOSIS — E119 Type 2 diabetes mellitus without complications: Secondary | ICD-10-CM | POA: Insufficient documentation

## 2019-04-03 DIAGNOSIS — M5412 Radiculopathy, cervical region: Secondary | ICD-10-CM | POA: Diagnosis not present

## 2019-04-03 DIAGNOSIS — R0902 Hypoxemia: Secondary | ICD-10-CM | POA: Diagnosis not present

## 2019-04-03 DIAGNOSIS — R200 Anesthesia of skin: Secondary | ICD-10-CM | POA: Diagnosis not present

## 2019-04-03 DIAGNOSIS — I10 Essential (primary) hypertension: Secondary | ICD-10-CM

## 2019-04-03 LAB — HEMOGLOBIN A1C
Hgb A1c MFr Bld: 7.4 % of total Hgb — ABNORMAL HIGH (ref <5.7)
Mean Plasma Glucose: 166 (calc)
eAG (mmol/L): 9.2 (calc)

## 2019-04-03 MED ORDER — SERTRALINE HCL 50 MG PO TABS: 50.0000 mg | ORAL_TABLET | Freq: Every day | ORAL | 1 refills | Status: DC

## 2019-04-03 NOTE — Assessment & Plan Note (Signed)
Tolerating low-dose Zoloft. Increasing to 50 mg. Return in 1 month for PHQ and GAD. We are discontinuing alprazolam considering his recent alcohol abuse.

## 2019-04-03 NOTE — Progress Notes (Signed)
Subjective:    CC: Follow-up several issues  HPI: Hypertension: Controlled.  Grieving and depression: Did not note much improvement on Zoloft 25 though he was able to tolerate it.  Hypoxia: Obesity hypoventilation syndrome versus COPD, he did have some bibasilar scarring, history of smoking.  We got him some oxygen, he was needing 4 L with ambulation and occasional oxygen at rest.  He was unable to get a sleep study, they are not scheduling due to the COVID-19 drama.  Diabetes mellitus type 2: New diagnosis, A1c was in the sevens.  No polyuria, polydipsia, polyphagia.  He has lost approximately 30 pounds with dieting, agrees to do a little more exercising as well.  Numbness and tingling in left arm: Neck, radiating down the back of the arm but not quite to the fingertips, better with abduction of the shoulder as is typical for cervical radiculitis.  I reviewed the past medical history, family history, social history, surgical history, and allergies today and no changes were needed.  Please see the problem list section below in epic for further details.  Past Medical History: Past Medical History:  Diagnosis Date  . Chronic systolic CHF (congestive heart failure) (HCC)    a. 2D ECHO (03/06/15) EF 20-25%, diffuse HK. Asc aortic diameter: 74mm. Mild LA dilation, mild RV dilation. Mild RV systolic dysfunction   b. LifeVest placed on 02/2015 admissoin   . Coronary artery disease    a. 70% LAD lesion  . Elevated TSH   . ETOH abuse   . Hypertension   . Morbid obesity (HCC)    a. BMI ~46  . Persistent atrial fibrillation    a. newly dx 02/2015 admission. s/p successful TEE/DCCV  b. on Eliquis  . Pre-diabetes    a. HgA1c 6.1   Past Surgical History: Past Surgical History:  Procedure Laterality Date  . CARDIAC CATHETERIZATION N/A 04/26/2015   Procedure: Left Heart Cath and Coronary Angiography;  Surgeon: Lyn Records, MD;  Location: [facility] INVASIVE CV LAB;  Service: Cardiovascular;   Laterality: N/A;  . CARDIOVERSION N/A 03/06/2015   Procedure: CARDIOVERSION;  Surgeon: Laurey Morale, MD;  Location: [facility] ENDOSCOPY;  Service: Cardiovascular;  Laterality: N/A;  . HERNIA REPAIR    . TEE WITHOUT CARDIOVERSION N/A 03/06/2015   Procedure: TRANSESOPHAGEAL ECHOCARDIOGRAM (TEE);  Surgeon: Laurey Morale, MD;  Location[facility] ENDOSCOPY;  Service: Cardiovascular;  Laterality: N/A;  . TEE WITHOUT CARDIOVERSION N/A 05/02/2015   Procedure: TRANSESOPHAGEAL ECHOCARDIOGRAM (TEE);  Surgeon: Lewayne Bunting, MD;  Location: [facility] ENDOSCOPY;  Service: Cardiovascular;  Laterality: N/A;   Social History: Social History   Socioeconomic History  . Marital status: Married    Spouse name: Not on file  . Number of children: Not on file  . Years of education: Not on file  . Highest education level: Not on file  Occupational History  . Not on file  Social Needs  . Financial resource strain: Not on file  . Food insecurity:    Worry: Not on file    Inability: Not on file  . Transportation needs:    Medical: Not on file    Non-medical: Not on file  Tobacco Use  . Smoking status: Former Smoker    Types: Cigarettes    Last attempt to quit: 01/02/2013    Years since quitting: 6.2  . Smokeless tobacco: Never Used  . Tobacco comment: smokes about a week  Substance and Sexual Activity  . Alcohol use: Yes    Alcohol/week: 0.0 standard  drinks  . Drug use: No  . Sexual activity: Not Currently  Lifestyle  . Physical activity:    Days per week: Not on file    Minutes per session: Not on file  . Stress: Not on file  Relationships  . Social connections:    Talks on phone: Not on file    Gets together: Not on file    Attends religious service: Not on file    Active member of club or organization: Not on file    Attends meetings of clubs or organizations: Not on file    Relationship status: Not on file  Other Topics Concern  . Not on file  Social History Narrative  . Not on file   Family  History: Family History  Problem Relation Age of Onset  . Uterine cancer Sister   . Heart attack Mother   . Breast cancer Mother   . Sleep apnea Brother   . Heart attack Maternal Grandfather   . Sleep apnea Brother    Allergies: No Known Allergies Medications: See med rec.  Review of Systems: No fevers, chills, night sweats, weight loss, chest pain, or shortness of breath.   Objective:    General: Well Developed, well nourished, and in no acute distress.  Neuro: Alert and oriented x3, extra-ocular muscles intact, sensation grossly intact.  HEENT: Normocephalic, atraumatic, pupils equal round reactive to light, neck supple, no masses, no lymphadenopathy, thyroid nonpalpable.  Skin: Warm and dry, no rashes. Cardiac: Regular rate and rhythm, no murmurs rubs or gallops, no lower extremity edema.  Respiratory: Clear to auscultation bilaterally. Not using accessory muscles, speaking in full sentences. Neck: Negative spurling's Full neck range of motion Grip strength and sensation normal in bilateral hands Strength good C4 to T1 distribution Subjective hypoesthesia in the left C7 distribution. Reflexes normal  Impression and Recommendations:    Grieving Tolerating low-dose Zoloft. Increasing to 50 mg. Return in 1 month for PHQ and GAD. We are discontinuing alprazolam considering his recent alcohol abuse.  Hypoxia Bibasilar scarring and atelectasis. O2 saturation improved to 92 to 93% with 3 L nasal cannula. He needs 4 L while walking. He has not had his sleep study, they are not scheduling them right now. No evidence of chronic metabolic alkalosis to suggest chronic respiratory acidosis on recent BMP. I still think there is an element of obesity hypoventilation syndrome as well as COPD with smoking history. Continue oxygen for now.  Radiculitis of left cervical region Left C7 distribution radiculitis. Patient desires to decrease medical costs, so we will not use any  medications, no imaging. Home rehab exercises given.  Diabetes mellitus type 2, diet-controlled (HCC) Continue low-carb diet, fruits and vegetable intake needs to be increased. He will walk for 15 minutes every single day in the sun. We can discuss further diabetes preventative medications in the future, but currently it is diet-controlled. Recheck hemoglobin A1c in 3 months.  Essential hypertension Blood pressure is adequately controlled on current medication regimen.   ___________________________________________ Ihor Austin. Benjamin Stain, M.D., ABFM., CAQSM. Primary Care and Sports Medicine [facility]  Adjunct Professor of Family Medicine  [employer]

## 2019-04-03 NOTE — Telephone Encounter (Signed)
Notified pt of A1C results.  Pt also coming in for [place] for r/o DVT, instructed to be here at 3.

## 2019-04-03 NOTE — Assessment & Plan Note (Signed)
Left C7 distribution radiculitis. Patient desires to decrease medical costs, so we will not use any medications, no imaging. Home rehab exercises given.

## 2019-04-03 NOTE — Assessment & Plan Note (Signed)
Continue low-carb diet, fruits and vegetable intake needs to be increased. He will walk for 15 minutes every single day in the sun. We can discuss further diabetes preventative medications in the future, but currently it is diet-controlled. Recheck hemoglobin A1c in 3 months.

## 2019-04-03 NOTE — Assessment & Plan Note (Signed)
Blood pressure is adequately controlled on current medication regimen.

## 2019-04-03 NOTE — Assessment & Plan Note (Signed)
Bibasilar scarring and atelectasis. O2 saturation improved to 92 to 93% with 3 L nasal cannula. He needs 4 L while walking. He has not had his sleep study, they are not scheduling them right now. No evidence of chronic metabolic alkalosis to suggest chronic respiratory acidosis on recent BMP. I still think there is an element of obesity hypoventilation syndrome as well as COPD with smoking history. Continue oxygen for now.

## 2019-04-06 ENCOUNTER — Ambulatory Visit: Payer: BLUE CROSS/BLUE SHIELD | Admitting: Psychology

## 2019-04-07 DIAGNOSIS — I2510 Atherosclerotic heart disease of native coronary artery without angina pectoris: Secondary | ICD-10-CM | POA: Diagnosis not present

## 2019-04-07 DIAGNOSIS — I509 Heart failure, unspecified: Secondary | ICD-10-CM | POA: Diagnosis not present

## 2019-04-07 DIAGNOSIS — I482 Chronic atrial fibrillation, unspecified: Secondary | ICD-10-CM | POA: Diagnosis not present

## 2019-04-19 DIAGNOSIS — I482 Chronic atrial fibrillation, unspecified: Secondary | ICD-10-CM | POA: Diagnosis not present

## 2019-04-19 DIAGNOSIS — I5020 Unspecified systolic (congestive) heart failure: Secondary | ICD-10-CM | POA: Diagnosis not present

## 2019-04-19 DIAGNOSIS — I2510 Atherosclerotic heart disease of native coronary artery without angina pectoris: Secondary | ICD-10-CM | POA: Diagnosis not present

## 2019-04-24 ENCOUNTER — Other Ambulatory Visit: Payer: Self-pay | Admitting: Sports Medicine

## 2019-04-24 MED ORDER — LORATADINE 10 MG PO TABS: 10.0000 mg | ORAL_TABLET | Freq: Every day | ORAL | 1 refills | Status: DC

## 2019-05-05 ENCOUNTER — Ambulatory Visit: Payer: Self-pay | Admitting: Sports Medicine

## 2019-05-07 DIAGNOSIS — I482 Chronic atrial fibrillation, unspecified: Secondary | ICD-10-CM | POA: Diagnosis not present

## 2019-05-07 DIAGNOSIS — I509 Heart failure, unspecified: Secondary | ICD-10-CM | POA: Diagnosis not present

## 2019-05-07 DIAGNOSIS — I2510 Atherosclerotic heart disease of native coronary artery without angina pectoris: Secondary | ICD-10-CM | POA: Diagnosis not present

## 2019-05-20 DIAGNOSIS — I5020 Unspecified systolic (congestive) heart failure: Secondary | ICD-10-CM | POA: Diagnosis not present

## 2019-05-20 DIAGNOSIS — I2510 Atherosclerotic heart disease of native coronary artery without angina pectoris: Secondary | ICD-10-CM | POA: Diagnosis not present

## 2019-05-20 DIAGNOSIS — I482 Chronic atrial fibrillation, unspecified: Secondary | ICD-10-CM | POA: Diagnosis not present

## 2019-05-24 ENCOUNTER — Other Ambulatory Visit: Payer: Self-pay

## 2019-05-24 MED ORDER — ATORVASTATIN CALCIUM 80 MG PO TABS: 80.0000 mg | ORAL_TABLET | Freq: Every day | ORAL | 3 refills | Status: DC

## 2019-05-31 ENCOUNTER — Other Ambulatory Visit: Payer: Self-pay

## 2019-05-31 ENCOUNTER — Other Ambulatory Visit: Payer: Self-pay | Admitting: Sports Medicine

## 2019-05-31 DIAGNOSIS — K219 Gastro-esophageal reflux disease without esophagitis: Secondary | ICD-10-CM

## 2019-05-31 MED ORDER — DOFETILIDE 500 MCG PO CAPS: 500.0000 ug | ORAL_CAPSULE | Freq: Two times a day (BID) | ORAL | 3 refills | Status: DC

## 2019-05-31 MED ORDER — PANTOPRAZOLE SODIUM 40 MG PO TBEC: 40.0000 mg | DELAYED_RELEASE_TABLET | Freq: Every day | ORAL | 1 refills | Status: DC

## 2019-05-31 MED ORDER — CARVEDILOL 25 MG PO TABS: 25.0000 mg | ORAL_TABLET | Freq: Two times a day (BID) | ORAL | 3 refills | Status: DC

## 2019-06-07 DIAGNOSIS — I2510 Atherosclerotic heart disease of native coronary artery without angina pectoris: Secondary | ICD-10-CM | POA: Diagnosis not present

## 2019-06-07 DIAGNOSIS — I482 Chronic atrial fibrillation, unspecified: Secondary | ICD-10-CM | POA: Diagnosis not present

## 2019-06-07 DIAGNOSIS — I509 Heart failure, unspecified: Secondary | ICD-10-CM | POA: Diagnosis not present

## 2019-06-12 ENCOUNTER — Other Ambulatory Visit: Payer: Self-pay

## 2019-06-12 ENCOUNTER — Encounter: Payer: Self-pay | Admitting: Pulmonary Disease

## 2019-06-12 ENCOUNTER — Ambulatory Visit (INDEPENDENT_AMBULATORY_CARE_PROVIDER_SITE_OTHER): Payer: BC Managed Care – PPO | Admitting: Pulmonary Disease

## 2019-06-12 DIAGNOSIS — R0602 Shortness of breath: Secondary | ICD-10-CM

## 2019-06-12 MED ORDER — BUDESONIDE-FORMOTEROL FUMARATE 160-4.5 MCG/ACT IN AERO: 2.0000 | INHALATION_SPRAY | Freq: Two times a day (BID) | RESPIRATORY_TRACT | 5 refills | Status: DC

## 2019-06-12 MED ORDER — LISINOPRIL 10 MG PO TABS: 10.0000 mg | ORAL_TABLET | Freq: Every day | ORAL | 3 refills | Status: DC

## 2019-06-12 MED ORDER — BUDESONIDE-FORMOTEROL FUMARATE 160-4.5 MCG/ACT IN AERO: 2.0000 | INHALATION_SPRAY | Freq: Two times a day (BID) | RESPIRATORY_TRACT | 0 refills | Status: DC

## 2019-06-12 NOTE — Patient Instructions (Addendum)
We will start you on an inhaler called Symbicort We will schedule you for high-resolution CT and pulmonary function test We will check some labs today including CBC with differential, IgE, alpha-1 antitrypsin levels and phenotype.    Follow-up in 1 month.

## 2019-06-12 NOTE — Progress Notes (Signed)
Arthur Church    [id number]    April 05, 1956  Primary Care Physician:Thekkekandam, Ihor Austin, MD  Referring Physician: Monica Becton, MD [id number] [facility] [address] [place],  [place] [address]  Chief complaint: Evaluation for dyspnea, hypoxia  HPI: 63 year old with history of non ischemic cardiomyopathy, atrial fibrillation, obesity Complains of dyspnea on exertion for the past 4 to 6 months.  Has no symptoms at rest Recently started on supplemental oxygen by primary care for desats. Has cough with white mucus, chest congestion.  Follows with Dr. Rennis Golden for cardiac issues.  Noted to have EF 20% in the past but echocardiogram in 2016 shows recovery of systolic function. Being evaluated for obstructive sleep apnea but polysomnogram is delayed due to COVID restrictions Not on any inhalers.  Pets: No pets Occupation: Works as a Electrical engineer for [employer]: No known exposures, no mold, hot tub, Jacuzzi Smoking history: 20-30-pack-year smoker.  Quit in 2013 Travel history: Generally from [place].  No significant recent travel Relevant family history: No family history COPD due to smoking  Outpatient Encounter Medications as of 06/12/2019  Medication Sig  . acetaminophen (TYLENOL) 500 MG tablet Take 500 mg by mouth every 6 (six) hours as needed for mild pain or moderate pain.  Marland Kitchen ALPRAZolam (XANAX) 0.5 MG tablet TAKE 1 TABLET BY MOUTH THREE TIMES DAILYAS NEEDED FOR ANXIETY  . AMBULATORY NON FORMULARY MEDICATION CPAP mask and tubing  . AMBULATORY NON FORMULARY MEDICATION Portable oxygen concentrator, 3 L [place].  Please fax to [facility]  . apixaban (ELIQUIS) 5 MG TABS tablet Take 1 tablet (5 mg total) by mouth 2 (two) times daily.  Marland Kitchen atorvastatin (LIPITOR) 80 MG tablet Take 1 tablet (80 mg total) by mouth daily.  . carvedilol (COREG) 25 MG tablet Take 1 tablet (25 mg total) by mouth 2 (two) times daily.  Marland Kitchen dofetilide (TIKOSYN) 500 MCG capsule Take 1 capsule (500 mcg  total) by mouth 2 (two) times daily.  . furosemide (LASIX) 40 MG tablet Take 1 tablet (40 mg total) by mouth 2 (two) times daily.  Marland Kitchen lisinopril (ZESTRIL) 10 MG tablet Take 1 tablet (10 mg total) by mouth daily.  Marland Kitchen loratadine (CLARITIN) 10 MG tablet Take 1 tablet (10 mg total) by mouth daily.  . montelukast (SINGULAIR) 10 MG tablet Take 1 tablet (10 mg total) by mouth at bedtime.  . pantoprazole (PROTONIX) 40 MG tablet Take 1 tablet (40 mg total) by mouth daily.  . sertraline (ZOLOFT) 50 MG tablet Take 1 tablet (50 mg total) by mouth daily.   No facility-administered encounter medications on file as of 06/12/2019.     Allergies as of 06/12/2019  . (No Known Allergies)    Past Medical History:  Diagnosis Date  . Chronic systolic CHF (congestive heart failure) (HCC)    a. 2D ECHO (03/06/15) EF 20-25%, diffuse HK. Asc aortic diameter: 71mm. Mild LA dilation, mild RV dilation. Mild RV systolic dysfunction   b. LifeVest placed on 02/2015 admissoin   . Coronary artery disease    a. 70% LAD lesion  . Elevated TSH   . ETOH abuse   . Hypertension   . Morbid obesity (HCC)    a. BMI ~46  . Persistent atrial fibrillation    a. newly dx 02/2015 admission. s/p successful TEE/DCCV  b. on Eliquis  . Pre-diabetes    a. HgA1c 6.1    Past Surgical History:  Procedure Laterality Date  . CARDIAC CATHETERIZATION  N/A 04/26/2015   Procedure: Left Heart Cath and Coronary Angiography;  Surgeon: Lyn RecordsHenry W Smith, MD;  [facility];  Service: Cardiovascular;  Laterality: N/A;  . CARDIOVERSION N/A 03/06/2015   Procedure: CARDIOVERSION;  Surgeon: Laurey Moralealton S McLean, MD;  [facility];  Service: Cardiovascular;  Laterality: N/A;  . HERNIA REPAIR    . TEE WITHOUT CARDIOVERSION N/A 03/06/2015   Procedure: TRANSESOPHAGEAL ECHOCARDIOGRAM (TEE);  Surgeon: Laurey Moralealton S McLean, MD;  [facility];  Service: Cardiovascular;  Laterality: N/A;  . TEE WITHOUT CARDIOVERSION N/A 05/02/2015   Procedure:  TRANSESOPHAGEAL ECHOCARDIOGRAM (TEE);  Surgeon: Lewayne BuntingBrian S Crenshaw, MD;  [facility];  Service: Cardiovascular;  Laterality: N/A;    Family History  Problem Relation Age of Onset  . Uterine cancer Sister   . Heart attack Mother   . Breast cancer Mother   . Sleep apnea Brother   . Heart attack Maternal Grandfather   . Sleep apnea Brother     Social History   Socioeconomic History  . Marital status: Married    Spouse name: Not on file  . Number of children: Not on file  . Years of education: Not on file  . Highest education level: Not on file  Occupational History  . Not on file  Social Needs  . Financial resource strain: Not on file  . Food insecurity    Worry: Not on file    Inability: Not on file  . Transportation needs    Medical: Not on file    Non-medical: Not on file  Tobacco Use  . Smoking status: Former Smoker    Types: Cigarettes    Quit date: 01/02/2013    Years since quitting: 6.4  . Smokeless tobacco: Never Used  . Tobacco comment: smokes about a week  Substance and Sexual Activity  . Alcohol use: Yes    Alcohol/week: 0.0 standard drinks  . Drug use: No  . Sexual activity: Not Currently  Lifestyle  . Physical activity    Days per week: Not on file    Minutes per session: Not on file  . Stress: Not on file  Relationships  . Social Musicianconnections    Talks on phone: Not on file    Gets together: Not on file    Attends religious service: Not on file    Active member of club or organization: Not on file    Attends meetings of clubs or organizations: Not on file    Relationship status: Not on file  . Intimate partner violence    Fear of current or ex partner: Not on file    Emotionally abused: Not on file    Physically abused: Not on file    Forced sexual activity: Not on file  Other Topics Concern  . Not on file  Social History Narrative  . Not on file    Review of systems: Review of Systems  Constitutional: Negative for fever and  chills.  HENT: Negative.   Eyes: Negative for blurred vision.  Respiratory: as per HPI  Cardiovascular: Negative for chest pain and palpitations.  Gastrointestinal: Negative for vomiting, diarrhea, blood per rectum. Genitourinary: Negative for dysuria, urgency, frequency and hematuria.  Musculoskeletal: Negative for myalgias, back pain and joint pain.  Skin: Negative for itching and rash.  Neurological: Negative for dizziness, tremors, focal weakness, seizures and loss of consciousness.  Endo/Heme/Allergies: Negative for environmental allergies.  Psychiatric/Behavioral: Negative for depression, suicidal ideas and hallucinations.  All other systems reviewed and are  negative.  Physical Exam: Blood pressure 124/62, pulse 69, temperature 98.6 F (37 C), temperature source Oral, height 6' (1.829 m), weight (!) 363 lb (164.7 kg), SpO2 91 %. Gen:      No acute distress  HEENT:  EOMI, sclera anicteric, obese Neck:     No masses; no thyromegaly Lungs:    Clear to auscultation bilaterally; normal respiratory effort CV:         Regular rate and rhythm; no murmurs Abd:      + bowel sounds; soft, non-tender; no palpable masses, no distension Ext:    No edema; adequate peripheral perfusion Skin:      Warm and dry; no rash Neuro: alert and oriented x 3 Psych: normal mood and affect  Data Reviewed: Imaging: CT chest 04/06/2015- atelectasis at the lung base.   Chest x-ray 03/21/2019- bibasal scarring.   I have reviewed the images personally.  Lower extremity ultrasound 04/03/2019-no evidence of DVT  Labs: CBC 03/29/2015-WBC 12, eos 3.3%, absolute eosinophil count 396  Cardiac: Echocardiogram 08/23/2015- Mild LVH, LVEF 46-27%, grade 1 diastolic dysfunction.  Assessment:  Dyspnea, hypoxia We will need evaluation for COPD given smoking history and also interstitial lung disease as chest x-ray shows some basal scarring Will schedule PFTs, high-resolution CT Check CBC differential, IgE, alpha-1  antitrypsin levels and phenotype  Trial Symbicort inhaler.  Reassess in 1 month.  Plan/Recommendations: - High-res CT, PFT - CBC, IgE, alpha-1 antitrypsin - Symbicort  Marshell Garfinkel MD [facility] 06/12/2019, 3:33 PM  CC: [name],*

## 2019-06-13 LAB — CBC WITH DIFFERENTIAL/PLATELET
Basophils Absolute: 0.1 10*3/uL (ref 0.0–0.1)
Basophils Relative: 1 % (ref 0.0–3.0)
Eosinophils Absolute: 0.3 10*3/uL (ref 0.0–0.7)
Eosinophils Relative: 2.2 % (ref 0.0–5.0)
HCT: 43.4 % (ref 39.0–52.0)
Hemoglobin: 14.1 g/dL (ref 13.0–17.0)
Lymphocytes Relative: 23.9 % (ref 12.0–46.0)
Lymphs Abs: 3 10*3/uL (ref 0.7–4.0)
MCHC: 32.5 g/dL (ref 30.0–36.0)
MCV: 90.5 fl (ref 78.0–100.0)
Monocytes Absolute: 0.7 10*3/uL (ref 0.1–1.0)
Monocytes Relative: 5.6 % (ref 3.0–12.0)
Neutro Abs: 8.4 10*3/uL — ABNORMAL HIGH (ref 1.4–7.7)
Neutrophils Relative %: 67.3 % (ref 43.0–77.0)
Platelets: 285 10*3/uL (ref 150.0–400.0)
RBC: 4.8 Mil/uL (ref 4.22–5.81)
RDW: 14.3 % (ref 11.5–15.5)
WBC: 12.5 10*3/uL — ABNORMAL HIGH (ref 4.0–10.5)

## 2019-06-17 LAB — ALPHA-1 ANTITRYPSIN PHENOTYPE: A-1 Antitrypsin, Ser: 167 mg/dL (ref 83–199)

## 2019-06-17 LAB — IGE: IgE (Immunoglobulin E), Serum: 10 kU/L (ref <=114)

## 2019-06-19 ENCOUNTER — Other Ambulatory Visit: Payer: Self-pay | Admitting: Sports Medicine

## 2019-06-19 DIAGNOSIS — I2510 Atherosclerotic heart disease of native coronary artery without angina pectoris: Secondary | ICD-10-CM | POA: Diagnosis not present

## 2019-06-19 DIAGNOSIS — I5020 Unspecified systolic (congestive) heart failure: Secondary | ICD-10-CM | POA: Diagnosis not present

## 2019-06-19 DIAGNOSIS — I482 Chronic atrial fibrillation, unspecified: Secondary | ICD-10-CM | POA: Diagnosis not present

## 2019-06-19 DIAGNOSIS — F4321 Adjustment disorder with depressed mood: Secondary | ICD-10-CM

## 2019-06-19 MED ORDER — SERTRALINE HCL 50 MG PO TABS: 50.0000 mg | ORAL_TABLET | Freq: Every day | ORAL | 1 refills | Status: DC

## 2019-06-20 ENCOUNTER — Telehealth: Payer: Self-pay | Admitting: *Deleted

## 2019-06-20 DIAGNOSIS — G4733 Obstructive sleep apnea (adult) (pediatric): Secondary | ICD-10-CM

## 2019-06-20 NOTE — Telephone Encounter (Signed)
Done!  Please double check behind me to make sure I dundidit right.

## 2019-06-20 NOTE — Telephone Encounter (Signed)
Pt needs an order placed for sleep study not a referral.  [facility] Church schedules from a queue and the referrals do not filter into their queue.

## 2019-06-21 ENCOUNTER — Telehealth: Payer: Self-pay | Admitting: Pulmonary Disease

## 2019-06-21 ENCOUNTER — Institutional Professional Consult (permissible substitution): Payer: Self-pay | Admitting: Pulmonary Disease

## 2019-06-21 NOTE — Telephone Encounter (Signed)
Called and spoke with pt in regards to the sleep study. Asked pt if he had done the sleep study yet and pt stated that he has not.  While speaking with pt, pt was very confused about upcoming appts. Pt thought the CT was going to interfere with the sleep study but I pulled up pt's appt list and went through all of pt's appts with him and helped him not be confused. Since pt was scheduled for the split night study 7/27 at 8pm and had an OV with Aaron Edelman at 3pm, I rescheduled pt's OV with Aaron Edelman to 8/3 at 2pm so that way Aaron Edelman can go over results of the sleep study with pt when he comes in for the [place]. Nothing further needed.

## 2019-07-03 ENCOUNTER — Other Ambulatory Visit: Payer: Self-pay

## 2019-07-03 ENCOUNTER — Telehealth: Payer: Self-pay | Admitting: Pulmonary Disease

## 2019-07-03 ENCOUNTER — Ambulatory Visit
Admission: RE | Admit: 2019-07-03 | Discharge: 2019-07-03 | Disposition: A | Discharge status: Home / Self Care | Payer: BC Managed Care – PPO | Source: Ambulatory Visit | Attending: Pulmonary Disease | Admitting: Pulmonary Disease

## 2019-07-03 DIAGNOSIS — R0602 Shortness of breath: Secondary | ICD-10-CM

## 2019-07-03 NOTE — Telephone Encounter (Signed)
Stacy, CT left a message to let Dr Vaughan Browner know, Patient CT chest was cancelled.  I sent a message to [facility] for reason or update. Marzetta Board stated Patient could not tolerate laying flat for the CT.  Erline Levine stated Patient kept having to sit up.  Marzetta Board stated Patient said he could possibly tolerate the CT, if he was medicated. As of now CT high resolution chest is cancelled.   Message routed to Dr Vaughan Browner to advice

## 2019-07-04 ENCOUNTER — Other Ambulatory Visit: Payer: BC Managed Care – PPO

## 2019-07-04 NOTE — Telephone Encounter (Signed)
Can discuss at return [place] on 8/3

## 2019-07-04 NOTE — Telephone Encounter (Signed)
Noted. Will close encounter as this will be addressed at appt.

## 2019-07-07 ENCOUNTER — Other Ambulatory Visit (HOSPITAL_COMMUNITY)
Admission: RE | Admit: 2019-07-07 | Discharge: 2019-07-07 | Disposition: A | Discharge status: Home / Self Care | Payer: BC Managed Care – PPO | Source: Ambulatory Visit | Attending: Internal Medicine | Admitting: Internal Medicine

## 2019-07-07 DIAGNOSIS — I509 Heart failure, unspecified: Secondary | ICD-10-CM | POA: Diagnosis not present

## 2019-07-07 DIAGNOSIS — I482 Chronic atrial fibrillation, unspecified: Secondary | ICD-10-CM | POA: Diagnosis not present

## 2019-07-07 DIAGNOSIS — I2510 Atherosclerotic heart disease of native coronary artery without angina pectoris: Secondary | ICD-10-CM | POA: Diagnosis not present

## 2019-07-07 DIAGNOSIS — Z20828 Contact with and (suspected) exposure to other viral communicable diseases: Secondary | ICD-10-CM | POA: Insufficient documentation

## 2019-07-08 LAB — SARS CORONAVIRUS 2 (TAT 6-24 HRS): SARS Coronavirus 2: NEGATIVE

## 2019-07-10 ENCOUNTER — Other Ambulatory Visit: Payer: Self-pay

## 2019-07-10 ENCOUNTER — Ambulatory Visit (HOSPITAL_BASED_OUTPATIENT_CLINIC_OR_DEPARTMENT_OTHER): Payer: BC Managed Care – PPO | Attending: Sports Medicine | Admitting: Internal Medicine

## 2019-07-10 ENCOUNTER — Ambulatory Visit: Payer: BC Managed Care – PPO | Admitting: Pulmonary Disease

## 2019-07-10 DIAGNOSIS — G4761 Periodic limb movement disorder: Secondary | ICD-10-CM | POA: Diagnosis not present

## 2019-07-10 DIAGNOSIS — G4733 Obstructive sleep apnea (adult) (pediatric): Secondary | ICD-10-CM | POA: Diagnosis not present

## 2019-07-11 ENCOUNTER — Other Ambulatory Visit: Payer: Self-pay

## 2019-07-12 ENCOUNTER — Encounter: Payer: Self-pay | Admitting: Sports Medicine

## 2019-07-12 DIAGNOSIS — G4733 Obstructive sleep apnea (adult) (pediatric): Secondary | ICD-10-CM

## 2019-07-12 NOTE — Procedures (Signed)
   Patient Name: Arthur Church, Arthur Church Date: 07/10/2019 Gender: Male D.O.B: 1956-04-07 Age (years): 62 Referring Provider: [name] Thekkekandam Height (inches): 16 Interpreting Physician: Baird Lyons MD, ABSM Weight (lbs): 350 RPSGT: Carolin Coy BMI: 71 MRN: [id number] Neck Size: 18.00  CLINICAL INFORMATION Sleep Study Type: NPSG Indication for sleep study: Fatigue, Hypertension, Obesity, Snoring Epworth Sleepiness Score: 6  Most recent polysomnogram dated 10/25/2016 revealed an AHI of 8.6/h and RDI of 18.0/h.  SLEEP STUDY TECHNIQUE As per the AASM Manual for the Scoring of Sleep and Associated Events v2.3 (April 2016) with a hypopnea requiring 4% desaturations.  The channels recorded and monitored were frontal, central and occipital EEG, electrooculogram (EOG), submentalis EMG (chin), nasal and oral airflow, thoracic and abdominal wall motion, anterior tibialis EMG, snore microphone, electrocardiogram, and pulse oximetry.  MEDICATIONS Medications self-administered by patient taken the night of the study : ELIQUIS, LIPITOR, COREG, LISINOPRIL, TIKOSYN, SINGULAIR, TYLENOL PM  SLEEP ARCHITECTURE The study was initiated at 11:13:10 PM and ended at 5:18:54 AM.  Sleep onset time was 48.4 minutes and the sleep efficiency was 54.0%%. The total sleep time was 197.5 minutes.  Stage REM latency was 254.5 minutes.  The patient spent 31.4%% of the night in stage N1 sleep, 63.0%% in stage N2 sleep, 0.0%% in stage N3 and 5.6% in REM.  Alpha intrusion was absent.  Supine sleep was 0.00%.  RESPIRATORY PARAMETERS The overall apnea/hypopnea index (AHI) was 3.6 per hour. There were 6 total apneas, including 6 obstructive, 0 central and 0 mixed apneas. There were 6 hypopneas and 32 RERAs.  The AHI during Stage REM sleep was 65.5 per hour.  AHI while supine was N/A per hour.  The mean oxygen saturation was 92.4%. The minimum SpO2 during sleep was 83.0%.  soft snoring was noted during  this study.  CARDIAC DATA The 2 lead EKG demonstrated sinus rhythm. The mean heart rate was 75.3 beats per minute. Other EKG findings include: PVCs.  LEG MOVEMENT DATA The total PLMS were 0 with a resulting PLMS index of 0.0. Associated arousal with leg movement index was 32.5 .  IMPRESSIONS - No significant obstructive sleep apnea occurred during this study (AHI = 3.6/h). - No significant central sleep apnea occurred during this study (CAI = 0.0/h). - Oxygen desaturation was noted during this study (Min O2 = 83.0%). Mean sat 92.4%.  - The patient snored with soft snoring volume. - EKG findings include frequent PVCs including a few triplets. - Clinically significant periodic limb movements. Total Limb Movements 392. Limb movements with arousals totalled 107, LM with arousal Index 32.5/ hr. - Difficulty initiatining and maintaining sleep with frequent brief awakenings and arousals.  DIAGNOSIS - Periodic Limb Movement During Sleep (327.51 [G47.61 ICD-10])  RECOMMENDATIONS -  Consider Mirapex, Requip, or Sinemet for treatment of Periodic Leg Movements of Sleep. - Sleep hygiene should be reviewed to assess factors that may improve sleep quality. - Weight management and regular exercise should be initiated or continued if appropriate.  [Electronically signed] 07/12/2019 01:50 PM  Baird Lyons MD, [place], American Board of Sleep Medicine   NPI: [id number]                          [place], [place] [facility]  ELECTRONICALLY SIGNED ON:  07/12/2019, 1:45 PM [place] PH: [phone]   FX: [phone] [place]

## 2019-07-13 ENCOUNTER — Ambulatory Visit (INDEPENDENT_AMBULATORY_CARE_PROVIDER_SITE_OTHER): Payer: BC Managed Care – PPO | Admitting: Internal Medicine

## 2019-07-13 ENCOUNTER — Encounter: Payer: Self-pay | Admitting: Internal Medicine

## 2019-07-13 ENCOUNTER — Telehealth: Payer: Self-pay | Admitting: Pulmonary Disease

## 2019-07-13 ENCOUNTER — Other Ambulatory Visit: Payer: Self-pay

## 2019-07-13 VITALS — BP 118/76 | HR 67 | Temp 97.7°F | Ht 72.0 in | Wt 361.6 lb

## 2019-07-13 DIAGNOSIS — R252 Cramp and spasm: Secondary | ICD-10-CM | POA: Diagnosis not present

## 2019-07-13 DIAGNOSIS — I10 Essential (primary) hypertension: Secondary | ICD-10-CM

## 2019-07-13 DIAGNOSIS — F1010 Alcohol abuse, uncomplicated: Secondary | ICD-10-CM

## 2019-07-13 DIAGNOSIS — Z5181 Encounter for therapeutic drug level monitoring: Secondary | ICD-10-CM

## 2019-07-13 DIAGNOSIS — Z79899 Other long term (current) drug therapy: Secondary | ICD-10-CM

## 2019-07-13 DIAGNOSIS — I480 Paroxysmal atrial fibrillation: Secondary | ICD-10-CM

## 2019-07-13 DIAGNOSIS — I428 Other cardiomyopathies: Secondary | ICD-10-CM | POA: Diagnosis not present
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
## 2019-07-13 NOTE — Patient Instructions (Signed)
Medication Instructions:  Your physician recommends that you continue on your current medications as directed. Please refer to the Current Medication list given to you today.  If you need a refill on your cardiac medications before your next appointment, please call your pharmacy.   Lab work: BMET, magnesium  If you have labs (blood work) drawn today and your tests are completely normal, you will receive your results only by: Marland Kitchen MyChart Message (if you have MyChart) OR . A paper copy in the mail If you have any lab test that is abnormal or we need to change your treatment, we will call you to review the results.  Testing/Procedures: NONE  Follow-Up: At [facility], you and your health needs are our priority.  As part of our continuing mission to provide you with exceptional heart care, we have created designated Provider Care Teams.  These Care Teams include your primary Cardiologist (physician) and Advanced Practice Providers (APPs -  Physician Assistants and Nurse Practitioners) who all work together to provide you with the care you need, when you need it. You will need a follow up appointment in 6 months.  Please call our office 2 months in advance to schedule this appointment.  You may see Dr. Debara Pickett or one of the following Advanced Practice Providers on your designated Care Team: Almyra Deforest, [place] . Fabian Sharp, PA-C  Any Other Special Instructions Will Be Listed Below (If Applicable).

## 2019-07-13 NOTE — Telephone Encounter (Signed)
Attempted to call pt but unable to reach. Left pt a detailed message at his request letting him know that we still want him to keep his current scheduled appt on 8/3 even though he was unable to complete CT. Nothing further needed.

## 2019-07-14 ENCOUNTER — Telehealth: Payer: Self-pay | Admitting: Internal Medicine

## 2019-07-14 DIAGNOSIS — Z79899 Other long term (current) drug therapy: Secondary | ICD-10-CM

## 2019-07-14 DIAGNOSIS — R790 Abnormal level of blood mineral: Secondary | ICD-10-CM

## 2019-07-14 LAB — BASIC METABOLIC PANEL
BUN/Creatinine Ratio: 22 (ref 10–24)
BUN: 14 mg/dL (ref 8–27)
CO2: 27 mmol/L (ref 20–29)
Calcium: 9.3 mg/dL (ref 8.6–10.2)
Chloride: 95 mmol/L — ABNORMAL LOW (ref 96–106)
Creatinine, Ser: 0.63 mg/dL — ABNORMAL LOW (ref 0.76–1.27)
GFR calc Af Amer: 123 mL/min/{1.73_m2} (ref >59)
GFR calc non Af Amer: 106 mL/min/{1.73_m2} (ref >59)
Glucose: 122 mg/dL — ABNORMAL HIGH (ref 65–99)
Potassium: 4.5 mmol/L (ref 3.5–5.2)
Sodium: 140 mmol/L (ref 134–144)

## 2019-07-14 LAB — MAGNESIUM: Magnesium: 1.8 mg/dL (ref 1.6–2.3)

## 2019-07-14 MED ORDER — MAGNESIUM 400 MG PO TABS: 400.0000 mg | ORAL_TABLET | Freq: Two times a day (BID) | ORAL | 11 refills | Status: AC

## 2019-07-14 NOTE — Progress Notes (Signed)
@Patient  ID: Arthur Church, male    DOB: 01/22/1956, 63 y.o.   MRN: [id number]  Chief Complaint  Patient presents with  . Follow-up    Follow-up for dyspnea    Referring provider: Monica Becton  HPI:  63 year old male referred to our office on 06/12/2019 for evaluation of dyspnea  PMH: Hypertension, morbid obesity, A. fib, varicose veins, type 2 diabetes Smoker/ Smoking History: Former smoker. Maintenance: Symbicort 160 Pt of: Dr. Isaiah Serge  07/17/2019  - Visit   63 year old male former smoker followed in our office for dyspnea on exertion.  Patient also has chronic respiratory failure which is managed on oxygen with exertion 4 L pulsed, and 2 L O2 at night.  Patient presented to our office on 06/12/2019 for initial consult with Dr. Isaiah Serge.  Patient has since then completed a split-night sleep study which ruled out obstructive sleep apnea.  Patient attempted to complete a high-resolution CT chest but this was canceled due to patient's anxiety.  Patient reporting today that he was also fluid overloaded at that time he is interested in trying again to have a high-resolution CT chest completed.  Patient was also supposed to complete a pulmonary function test but this was delayed based off of COVID-19 parameters.  We will work to get the patient scheduled for this.  Patient presents today with his POC, but he reports that he does not typically utilize the POC often.  For instance he may go to a grocery store shop for all of his groceries and then come out to the car and put his POC on as he drives home.  He also has a Electrical engineer at [employer], and reports that he does not use his POC while he is working.  Patient reports that since starting Symbicort 160 after last office visit this has helped some.  He does not believe that this is overwhelmingly helped though his dyspnea.  He is able to clear his throat more.  He denies wheezing, cough, increased congestion.  Patient reports he is  recently completed follow-up with primary care as well as cardiology both of which are recommending patient to apply for long-term disability.  Patient reports that he is in the beginning process working with his daughter to complete this.     Tests:   Imaging: CT chest 04/06/2015- atelectasis at the lung base.   Chest x-ray 03/21/2019- bibasal scarring.    Lower extremity ultrasound 04/03/2019-no evidence of DVT   Labs: CBC 03/29/2015-WBC 12, eos 3.3%, absolute eosinophil count 396  06/12/2019-CBC with differential-eosinophils relative 2.2, eosinophils absolute 0.3 06/12/2019-IgE-10 06/12/2019-alpha-1 antitrypsin phenotype- 167, phenotype PI*MM  07/07/2019-SARS-CoV-2-negative  Cardiac: 08/23/2015-echocardiogram-LV ejection fraction 55 to 60%, mild LVH, grade 1 diastolic dysfunction    07/10/2019-split-night sleep study-no significant obstructive sleep apnea, AHI 3.6.  SaO2 low 83%.  Periodic limb movements during sleep.  Consider Mirapex, Requip or Sinemet for treatment of periodic leg movements.  SIX MIN WALK 07/17/2019 07/17/2019  Supplimental Oxygen during Test? (L/min) Yes No  O2 Flow Rate 4 -  Type Pulse -  Tech Comments: Pt ambulated at a moderate pace with a steady gate. Pt stated he just had mild dyspnea. Pt denied any CP/tightness and pain. -     FENO:  No results found for: NITRICOXIDE  PFT: No flowsheet data found.  Imaging: No results found.    Specialty Problems      Pulmonary Problems   Chronic respiratory failure with hypoxia (HCC)    07/17/2019 walk in office  today revealed patient needs 4 L pulsed with exertion 2 L at night      LPRD (laryngopharyngeal reflux disease)   Shortness of breath      No Known Allergies  Immunization History  Administered Date(s) Administered  . Influenza Split 10/11/2018  . Influenza,inj,Quad PF,6+ Mos 10/21/2017  . Tdap 04/06/2015    Past Medical History:  Diagnosis Date  . Chronic systolic CHF (congestive heart  failure) (HCC)    a. 2D ECHO (03/06/15) EF 20-25%, diffuse HK. Asc aortic diameter: 40mm. Mild LA dilation, mild RV dilation. Mild RV systolic dysfunction   b. LifeVest placed on 02/2015 admissoin   . Coronary artery disease    a. 70% LAD lesion  . Elevated TSH   . ETOH abuse   . Hypertension   . Morbid obesity (HCC)    a. BMI ~46  . Persistent atrial fibrillation    a. newly dx 02/2015 admission. s/p successful TEE/DCCV  b. on Eliquis  . Pre-diabetes    a. HgA1c 6.1    Tobacco History: Social History   Tobacco Use  Smoking Status Former Smoker  . Types: Cigarettes  . Quit date: 01/02/2013  . Years since quitting: 6.5  Smokeless Tobacco Never Used  Tobacco Comment   smokes about a week   Counseling given: Yes Comment: smokes about a week  Continue to not smoke  Outpatient Encounter Medications as of 07/17/2019  Medication Sig  . acetaminophen (TYLENOL) 500 MG tablet Take 500 mg by mouth every 6 (six) hours as needed for mild pain or moderate pain.  Marland Kitchen. ALPRAZolam (XANAX) 0.5 MG tablet TAKE 1 TABLET BY MOUTH THREE TIMES DAILYAS NEEDED FOR ANXIETY  . AMBULATORY NON FORMULARY MEDICATION Portable oxygen concentrator, 3 L [place].  Please fax to [facility]  . apixaban (ELIQUIS) 5 MG TABS tablet Take 1 tablet (5 mg total) by mouth 2 (two) times daily.  Marland Kitchen. atorvastatin (LIPITOR) 80 MG tablet Take 1 tablet (80 mg total) by mouth daily.  . budesonide-formoterol (SYMBICORT) 160-4.5 MCG/ACT inhaler Inhale 2 puffs into the lungs 2 (two) times daily.  . carvedilol (COREG) 25 MG tablet Take 1 tablet (25 mg total) by mouth 2 (two) times daily.  Marland Kitchen. dofetilide (TIKOSYN) 500 MCG capsule Take 1 capsule (500 mcg total) by mouth 2 (two) times daily.  . furosemide (LASIX) 40 MG tablet Take 1 tablet (40 mg total) by mouth 2 (two) times daily.  Marland Kitchen. lisinopril (ZESTRIL) 10 MG tablet Take 1 tablet (10 mg total) by mouth daily.  Marland Kitchen. loratadine (CLARITIN) 10 MG tablet Take 1 tablet (10 mg total) by mouth daily.  .  Magnesium 400 MG TABS Take 400 mg by mouth 2 (two) times a day.  . montelukast (SINGULAIR) 10 MG tablet Take 1 tablet (10 mg total) by mouth at bedtime.  . pantoprazole (PROTONIX) 40 MG tablet Take 1 tablet (40 mg total) by mouth daily.  . sertraline (ZOLOFT) 50 MG tablet Take 1 tablet (50 mg total) by mouth daily.  . AMBULATORY NON FORMULARY MEDICATION CPAP mask and tubing (Patient not taking: Reported on 07/17/2019)  . budesonide-formoterol (SYMBICORT) 160-4.5 MCG/ACT inhaler Inhale 2 puffs into the lungs 2 (two) times daily.   No facility-administered encounter medications on file as of 07/17/2019.      Review of Systems  Review of Systems  Constitutional: Positive for fatigue. Negative for activity change, chills, fever and unexpected weight change.  HENT: Negative for postnasal drip, rhinorrhea, sinus pressure, sinus pain and sore throat.  Eyes: Negative.   Respiratory: Positive for shortness of breath. Negative for cough and wheezing.   Cardiovascular: Positive for leg swelling. Negative for chest pain and palpitations.  Gastrointestinal: Negative for diarrhea, nausea and vomiting.  Endocrine: Negative.   Genitourinary: Negative.   Musculoskeletal: Negative.   Skin: Negative.   Neurological: Negative for dizziness and headaches.  Psychiatric/Behavioral: Negative.  Negative for dysphoric mood. The patient is not nervous/anxious.   All other systems reviewed and are negative.    Physical Exam  BP 122/80 (BP Location: Left Arm, Cuff Size: Normal)   Pulse (!) 57   Temp 97.8 F (36.6 C) (Oral)   Ht 6' (1.829 m)   Wt (!) 366 lb (166 kg)   SpO2 92%   BMI 49.64 kg/m   Wt Readings from Last 5 Encounters:  07/17/19 (!) 366 lb (166 kg)  07/13/19 (!) 361 lb 9.6 oz (164 kg)  07/10/19 (!) 350 lb (158.8 kg)  06/12/19 (!) 363 lb (164.7 kg)  04/03/19 (!) 366 lb (166 kg)   Discussed weight   Physical Exam Vitals signs and nursing note reviewed.  Constitutional:      General: He  is not in acute distress.    Appearance: Normal appearance. He is obese.  HENT:     Head: Normocephalic and atraumatic.     Right Ear: Hearing, tympanic membrane, ear canal and external ear normal.     Left Ear: Hearing, tympanic membrane, ear canal and external ear normal.     Nose: Nose normal. No mucosal edema, congestion or rhinorrhea.     Right Turbinates: Not enlarged.     Left Turbinates: Not enlarged.     Mouth/Throat:     Mouth: Mucous membranes are dry.     Pharynx: Oropharynx is clear. No oropharyngeal exudate.  Eyes:     Pupils: Pupils are equal, round, and reactive to light.  Neck:     Musculoskeletal: Normal range of motion.  Cardiovascular:     Rate and Rhythm: Normal rate and regular rhythm.     Pulses: Normal pulses.     Heart sounds: Normal heart sounds. No murmur.  Pulmonary:     Effort: Pulmonary effort is normal. No respiratory distress.     Breath sounds: Normal breath sounds. No decreased breath sounds, wheezing or rales.  Abdominal:     General: Abdomen is flat. Bowel sounds are normal. There is no distension.     Palpations: Abdomen is soft.     Tenderness: There is no abdominal tenderness.     Comments: Obese  Musculoskeletal:     Right lower leg: Edema (Trace) present.     Left lower leg: Edema (Trace) present.  Lymphadenopathy:     Cervical: No cervical adenopathy.  Skin:    General: Skin is warm and dry.     Capillary Refill: Capillary refill takes less than 2 seconds.     Findings: No erythema or rash.  Neurological:     General: No focal deficit present.     Mental Status: He is alert and oriented to person, place, and time.     Motor: No weakness.     Coordination: Coordination normal.     Gait: Gait is intact. Gait normal.  Psychiatric:        Mood and Affect: Mood normal.        Behavior: Behavior normal. Behavior is cooperative.        Thought Content: Thought content normal.  Judgment: Judgment normal.      Lab Results:   CBC    Component Value Date/Time   WBC 12.5 (H) 06/12/2019 1603   RBC 4.80 06/12/2019 1603   HGB 14.1 06/12/2019 1603   HGB 15.5 11/30/2018 1204   HCT 43.4 06/12/2019 1603   HCT 45.3 11/30/2018 1204   PLT 285.0 06/12/2019 1603   PLT 229 11/30/2018 1204   MCV 90.5 06/12/2019 1603   MCV 88 11/30/2018 1204   MCH 30.1 11/30/2018 1204   MCH 29.1 04/23/2015 1443   MCHC 32.5 06/12/2019 1603   RDW 14.3 06/12/2019 1603   RDW 12.0 (L) 11/30/2018 1204   LYMPHSABS 3.0 06/12/2019 1603   MONOABS 0.7 06/12/2019 1603   EOSABS 0.3 06/12/2019 1603   BASOSABS 0.1 06/12/2019 1603    BMET    Component Value Date/Time   NA 140 07/13/2019 1218   K 4.5 07/13/2019 1218   CL 95 (L) 07/13/2019 1218   CO2 27 07/13/2019 1218   GLUCOSE 122 (H) 07/13/2019 1218   GLUCOSE 99 07/23/2015 1449   BUN 14 07/13/2019 1218   CREATININE 0.63 (L) 07/13/2019 1218   CREATININE 1.08 04/23/2015 1443   CALCIUM 9.3 07/13/2019 1218   GFRNONAA 106 07/13/2019 1218   GFRNONAA 83 03/15/2015 1542   GFRAA 123 07/13/2019 1218   GFRAA >89 03/15/2015 1542    BNP    Component Value Date/Time   BNP 109.4 (H) 03/21/2019 1419   BNP 410.6 (H) 03/04/2015 2023    ProBNP    Component Value Date/Time   PROBNP 352 (H) 11/30/2018 1204   PROBNP 172.0 (H) 03/29/2015 1334      Assessment & Plan:   Chronic respiratory failure with hypoxia (HCC) Sleep study negative for obstructive sleep apnea Walk in office today confirms patient needs 4 L pulsed with exertion 2 L at night  Plan: We will need to get patient scheduled for pulmonary function testing Emphasized the importance of patient using his O2 when he is instructed, patient admits that he will start using his 4 L pulsed with physical exertion Continue 2 L of O2 at night We have reordered high-resolution CT chest Follow-up in 4 weeks  Shortness of breath Plan: Start using oxygen as prescribed with physical exertion 4 L pulsed Continue 2 L of O2 at night We  will work to get you scheduled for pulmonary function testing We have reordered the high-resolution CT chest to further evaluate your shortness of breath Follow-up with our office in 4 to 6 weeks  Medication management Patient reporting difficulty with pain from his medications He is requesting samples of Symbicort 160 Patient also reports that his Eliquis cost him $75 a month  Plan: [facility] and assistance with cost Samples of Symbicort 160 today   Return in about 6 weeks (around 08/28/2019), or if symptoms worsen or fail to improve, for Follow up with Dr. Isaiah SergeMannam, Follow up for PFT, Follow up with Elisha HeadlandBrian Niaomi Cartaya FNP-C, After Chest CT.   Coral CeoBrian P Charli Liberatore, NP 07/17/2019   This appointment was 42 minutes long with over 50% of the time in direct face-to-face patient care, assessment, plan of care, and follow-up.

## 2019-07-14 NOTE — Telephone Encounter (Signed)
LM with results on name-verified VM & sent results to MyChart Med ordered Lab order will be mailed

## 2019-07-17 ENCOUNTER — Other Ambulatory Visit: Payer: Self-pay

## 2019-07-17 ENCOUNTER — Ambulatory Visit (INDEPENDENT_AMBULATORY_CARE_PROVIDER_SITE_OTHER): Payer: BC Managed Care – PPO | Admitting: Pulmonary Disease

## 2019-07-17 ENCOUNTER — Encounter: Payer: Self-pay | Admitting: Pulmonary Disease

## 2019-07-17 VITALS — BP 122/80 | HR 57 | Temp 97.8°F | Ht 72.0 in | Wt 366.0 lb

## 2019-07-17 DIAGNOSIS — R0602 Shortness of breath: Secondary | ICD-10-CM

## 2019-07-17 DIAGNOSIS — J9611 Chronic respiratory failure with hypoxia: Secondary | ICD-10-CM

## 2019-07-17 DIAGNOSIS — Z79899 Other long term (current) drug therapy: Secondary | ICD-10-CM

## 2019-07-17 MED ORDER — BUDESONIDE-FORMOTEROL FUMARATE 160-4.5 MCG/ACT IN AERO: 2.0000 | INHALATION_SPRAY | Freq: Two times a day (BID) | RESPIRATORY_TRACT | 0 refills | Status: DC

## 2019-07-17 NOTE — Assessment & Plan Note (Signed)
Patient reporting difficulty with pain from his medications He is requesting samples of Symbicort 160 Patient also reports that his Eliquis cost him $75 a month  Plan: [facility] for medication management and assistance with cost Samples of Symbicort 160 today

## 2019-07-17 NOTE — Assessment & Plan Note (Signed)
Plan: Start using oxygen as prescribed with physical exertion 4 L pulsed Continue 2 L of O2 at night We will work to get you scheduled for pulmonary function testing We have reordered the high-resolution CT chest to further evaluate your shortness of breath Follow-up with our office in 4 to 6 weeks

## 2019-07-17 NOTE — Patient Instructions (Addendum)
Walk today  >>> You required 4 L pulsed to be used when ambulating  Continue Symbicort 160 >>> 2 puffs in the morning right when you wake up, rinse out your mouth after use, 12 hours later 2 puffs, rinse after use >>> Take this daily, no matter what >>> This is not a rescue inhaler   Samples provided today   [facility] for a pharmacist to talk with you regarding your high medication cost with Symbicort, Eliquis and Tikosyn  We will schedule you for a pulmonary function test as well as reorder your high-resolution CT chest   Continue oxygen therapy as prescribed - 4L pulsed with exertion, nightime oxygen  >>>maintain oxygen saturations greater than 88 percent  >>>if unable to maintain oxygen saturations please contact the office  >>>do not smoke with oxygen  >>>can use nasal saline gel or nasal saline rinses to moisturize nose if oxygen causes dryness    Patient Education for Fluid Management  Do the following things EVERY DAY:   1. Weigh yourself EVERY morning after you go to the bathroom but before you eat or drink anything. Write this number down in a weight log/diary.    2. Take your medicines as prescribed. If you have concerns about your medications, please call us before you stop taking them.    3. Eat low salt foods-Limit salt (sodium) to 2000 mg per day. This will help prevent your body from holding onto fluid. Read food labels as many processed foods have a lot of sodium, especially canned goods and prepackaged meats. If you would like some assistance choosing low sodium foods, we would be happy to set you up with a nutritionist.   4. Stay as active as you can everyday. Staying active will give you more energy and make your muscles stronger. Start with 5 minutes at a time and work your way up to 30 minutes a day. Break up your activities--do some in the morning and some in the afternoon. Start with 3 days per week and work your way up to 5 days as  you can.  If you have chest pain, feel short of breath, dizzy, or lightheaded, STOP. If you don't feel better after a short rest, call 911. If you do feel better, call the office to let us know you have symptoms with exercise.   5. Limit all fluids for the day to less than 2 liters. Fluid includes all drinks, coffee, juice, ice chips, soup, jello, and all other liquids.    Please try contacting these local departments regarding paperwork that you may need in order to qualify for long-term disability based off of primary care, cardiology recommendations.  I would also recommend that you contact your human resources department at work:  [facility] department [address] [place], [place] [address] [phone]  Social Security office [address] [place]. [place], [place] [address] [phone]  [place] [facility] [address] [place], [place] [address] Telephone [phone]   Return in about 6 weeks (around 08/28/2019), or if symptoms worsen or fail to improve, for Follow up with Dr. Isaiah Serge, Follow up for PFT, Follow up with Elisha Headland FNP-C.     Coronavirus (COVID-19) Are you at risk?  Are you at risk for the Coronavirus (COVID-19)?  To be considered HIGH RISK for Coronavirus (COVID-19), you have to meet the following criteria:  . Traveled to [place], [place], [place], [place] or [place]; or in the [place] to [place],  [place], [place], or [place]; and have fever, cough, and shortness of breath within the last 2 weeks of travel OR . Been in close contact with a person diagnosed with COVID-19 within the last 2 weeks and have fever, cough, and shortness of breath . IF YOU DO NOT MEET THESE CRITERIA, YOU ARE CONSIDERED LOW RISK FOR COVID-19.  What to do if you are HIGH RISK for COVID-19?  Marland Kitchen. If you are having a medical emergency, call 911. . Seek medical care right away. Before you go to a doctor's office, urgent care or emergency  department, call ahead and tell them about your recent travel, contact with someone diagnosed with COVID-19, and your symptoms. You should receive instructions from your physician's office regarding next steps of care.  . When you arrive at healthcare provider, tell the healthcare staff immediately you have returned from visiting [place], [place], [place], [place] or [place]; or traveled in the [place] to [place], [place], [place], or [place]; in the last two weeks or you have been in close contact with a person diagnosed with COVID-19 in the last 2 weeks.   . Tell the health care staff about your symptoms: fever, cough and shortness of breath. . After you have been seen by a medical provider, you will be either: o Tested for (COVID-19) and discharged home on quarantine except to seek medical care if symptoms worsen, and asked to  - Stay home and avoid contact with others until you get your results (4-5 days)  - Avoid travel on public transportation if possible (such as bus, train, or airplane) or o Sent to the Emergency Department by EMS for evaluation, COVID-19 testing, and possible admission depending on your condition and test results.  What to do if you are LOW RISK for COVID-19?  Reduce your risk of any infection by using the same precautions used for avoiding the common cold or flu:  Marland Kitchen. Wash your hands often with soap and warm water for at least 20 seconds.  If soap and water are not readily available, use an alcohol-based hand sanitizer with at least 60% alcohol.  . If coughing or sneezing, cover your mouth and nose by coughing or sneezing into the elbow areas of your shirt or coat, into a tissue or into your sleeve (not your hands). . Avoid shaking hands with others and consider head nods or verbal greetings only. . Avoid touching your eyes, nose, or mouth with unwashed hands.  . Avoid close contact with people who are sick. . Avoid places or events with large numbers of people  in one location, like concerts or sporting events. . Carefully consider travel plans you have or are making. . If you are planning any travel outside or inside the [place], visit the CDC's Travelers' Health webpage for the latest health notices. . If you have some symptoms but not all symptoms, continue to monitor at home and seek medical attention if your symptoms worsen. . If you are having a medical emergency, call 911.   ADDITIONAL HEALTHCARE OPTIONS FOR PATIENTS  [facility] / e-Visit: [email or web address]         [facility]: [phone]  [facility]: [phone]                   [facility]: [phone]           It is flu season:   >>> Best ways to protect herself  from the flu: Receive the yearly flu vaccine, practice good hand hygiene washing with soap and also using hand sanitizer when available, eat a nutritious meals, get adequate rest, hydrate appropriately   Please contact the office if your symptoms worsen or you have concerns that you are not improving.   Thank you for choosing [facility] for your healthcare, and for allowing [place] to partner with you on your healthcare journey. I am thankful to be able to provide care to you today.   Wyn Quaker FNP-C

## 2019-07-17 NOTE — Assessment & Plan Note (Signed)
Sleep study negative for obstructive sleep apnea Walk in office today confirms patient needs 4 L pulsed with exertion 2 L at night  Plan: We will need to get patient scheduled for pulmonary function testing Emphasized the importance of patient using his O2 when he is instructed, patient admits that he will start using his 4 L pulsed with physical exertion Continue 2 L of O2 at night We have reordered high-resolution CT chest Follow-up in 4 weeks

## 2019-07-18 ENCOUNTER — Other Ambulatory Visit: Payer: Self-pay | Admitting: *Deleted

## 2019-07-18 ENCOUNTER — Encounter: Payer: Self-pay | Admitting: *Deleted

## 2019-07-18 NOTE — Patient Outreach (Signed)
[facility]) Care Management  07/18/2019  Arthur Church [birth year] [id number]   Telephone Screen  Referral Date: 07/17/19 Referral Source: 07/17/19 MD referral Wyn Quaker NP Pulmonary  Referral Reason: Reason for consult Med Costs - Eliquis, Symbicort, Tikosyn   Diagnoses of COPD/ Pneumonia Medication management Insurance: blue cross and blue shield commercial PPO     Social: Arthur Church is a 63 year old male   Conditions: HTN, chronic respiratory failure with hypoxia, LPRD (Laryngopharyngeal reflux disease), grieving and depression (Zoloft), former smoker Morbid obesity, Atrial fibrillation, varicose veins, Type 2 DM, recent sleep study negative for OSA, Radiculitis of left cervical region, chronic systolic CHF, CAD, etoh abuse, psoriasis, rosacea,   DME: oxygen as prescribed with physical exertion 4 L pulsed 2 L of O2 at night  Medications: samples of Symbicort 160 Patient also reports that his Eliquis cost him $75 a month   Appointments: 08/11/19 Radiology - CT   Outreach attempt # 1 at listed home number No answer. [facility] RN CM left HIPAA compliant voicemail message along with CM's contact info.   Plan: [facility] RN CM sent an unsuccessful outreach letter and scheduled this patient for another call attempt within 4 business days  Arthur Rames L. Lavina Hamman, RN, BSN, [place] Coordinator Office number [phone] Mobile number [phone]  Main [facility] number [id number] Fax number [phone]

## 2019-07-18 NOTE — Patient Outreach (Signed)
[facility] Customer service manager [facility]) Care Management  07/18/2019  Arthur Church [birth year] [id number]   Telephone Screen  Referral Date: 07/17/19 Referral Source: 07/17/19 MD referral Elisha Headland NP Pulmonary  Referral Reason: Reason for consult Med Costs - Eliquis, Symbicort, Tikosyn   Diagnoses of COPD/ Pneumonia Medication management Insurance: blue cross and blue shield commercial PPO    Arthur Church returned a call to [facility] RN CM  Patient is able to verify HIPAA Reviewed and addressed referral to [facility] with patient  He confirms the referral information   He denies any other need or goal he is working on other than the cost of the above mentioned medications that [facility] staff (community RN CM, Insurance claims handler CM, SW nor NP) can assist with  He was encouraged to call back if he did have other concerns  At moments during the assessment [facility] RN CM had difficulty hearing or understanding Arthur Church's responses.  Social: Arthur Church is a 63 year old male 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 He reports he is independent with all his care needs and transportation to medical appointments He is stating he is to either retire or go out on disability soon.  [facility] RN CM discussed [facility] SW services but he wants to consult with his daughter, Arthur Church who is a SW and return a call to [facility] prn  He confirms a recent negative covid test   Conditions: HTN, chronic respiratory failure with hypoxia, LPRD (Laryngopharyngeal reflux disease), grieving and depression (Zoloft), former smoker, Morbid obesity, Atrial fibrillation, atrial flutter s/p cardioversion on Tikosyn and on Eliquis for high CHADSVASC score of 5, intolerance to Entresto due to hyperkalemia, varicose veins, Type 2 DM- last HgA1c 04/01/19 ws 7.4, ws 6.1 and 6.4 in 2016 , recent sleep study negative for OSA, Radiculitis of left cervical region, chronic systolic CHF EF 55-60%, CAD, etoh abuse, psoriasis, rosacea, HLD   Fall None  DME: oxygen as  prescribed with physical exertion 4 L pulsed 2 L of O2 at night  Medications: samples of Symbicort 160 from the pulmonary MD office was provided on last visit Patient also reports that his Eliquis cost him $75 a month $42 oxygen cost monthly  Tikosyn   He reports just started Symbicort on last week and is using samples provided by the pulmonary office He reports he will not be able to afford more than the $42 + $75 cost monthly if he has to also start paying for Symbicort especially if he retires or goes on disability  Patient also reports that his Eliquis cost him $75 a month and it and Tikosyn is managed by [name], Cardiologist whom he has seen last week  He states no samples of Eliquis  He reports he has not had medication assistance form social security He reports he may be going to SSI soon as he either plans to retire or file for disability    Appointments: 08/11/19 Radiology - CT   Advance Directives:  Has POA    Consent: [facility] RN CM reviewed [facility] services with patient. Patient gave verbal consent for services [facility] telephonic RN CM.   Plan: [facility] RN CM will refer Arthur Church to [facility] pharmacy for medication assistance/management of Symbicort, and Eliquis  [facility] RN CM will close case at this time as patient has been assessed and no needs identified/needs resolved.   Pt encouraged to return a call to [facility] RN CM prn  [facility] RN CM discussed the mailed  unsuccessful outreach letter with [facility] brochure enclosed for review  Routed to MDs  Joelene Millin L. Lavina Hamman, RN, BSN, [place] Coordinator Office number [phone] Mobile number [phone]  Main [facility] number [phone] Fax number [phone]

## 2019-07-19 ENCOUNTER — Ambulatory Visit: Payer: Self-pay | Admitting: *Deleted

## 2019-07-20 DIAGNOSIS — I5020 Unspecified systolic (congestive) heart failure: Secondary | ICD-10-CM | POA: Diagnosis not present

## 2019-07-20 DIAGNOSIS — I2510 Atherosclerotic heart disease of native coronary artery without angina pectoris: Secondary | ICD-10-CM | POA: Diagnosis not present

## 2019-07-20 DIAGNOSIS — I482 Chronic atrial fibrillation, unspecified: Secondary | ICD-10-CM | POA: Diagnosis not present

## 2019-07-21 ENCOUNTER — Other Ambulatory Visit: Payer: Self-pay | Admitting: Pharmacist

## 2019-07-21 NOTE — Patient Outreach (Signed)
[facility]) Care Management  [place]   07/21/2019  Arthur Church 1956/03/31 [id number]  Reason for referral: Medication Assistance with Eliquis, Symbicort, Tikosyn  Referral source: Dr. Wyn Quaker Current insurance: [facility]  Outreach:  Unsuccessful telephone call attempt #1 to patient. HIPAA compliant voicemail left requesting a return call  Plan:  -I will mail patient an unsuccessful outreach letter.  -I will make another outreach attempt to patient within 3-4 business days.   Ralene Bathe, PharmD, [place] [phone]

## 2019-07-24 ENCOUNTER — Other Ambulatory Visit: Payer: Self-pay | Admitting: Pharmacy Technician

## 2019-07-24 ENCOUNTER — Ambulatory Visit: Payer: Self-pay | Admitting: Pharmacist

## 2019-07-24 ENCOUNTER — Other Ambulatory Visit: Payer: Self-pay | Admitting: Pharmacist

## 2019-07-24 NOTE — Patient Outreach (Signed)
[place] [facility]) Care Management  [place]   07/24/2019  Arthur Church [birth year] [id number]  Reason for referral: Medication Assistance with Eliquis, Symbicort, Tikosyn  Referral source: Dr. Wyn Quaker Current insurance: [facility]  PMHx includes but not limited to: HTN, CHF, CAD, atrial fibrillation on chronic anticoagulation, T2DM, chronic respiratory failure - needs PFTs, former smoker, on home 02   Outreach:  Successful telephone call with Mr. Arthur Church.  HIPAA identifiers verified.   Subjective:  Patient reports he does not have Medicare yet as he is only 63 years old.  He is currently working and has Pharmacist, community via [employer].  He and his daughter are working to apply for disability once he turns 63 (in 2 days) but process may take 6 months.  He reports his co-pays are very expensive for Symbicort and Eliquis ($70-100 / 90 day supply) and that Tikoysn is ~$10/90 days.  He thinks that the Tikosyn co-pay is fine for now but is worried it may increase.  He will be due for refills for all medications in September.  Patient states he is a household of 1 now, spouse passed away last year.    Objective: The ASCVD Risk score Arthur Bussing DC Jr., et al., 2013) failed to calculate for the following reasons:   Cannot find a previous HDL lab   Cannot find a previous total cholesterol lab  Lab Results  Component Value Date   CREATININE 0.63 (L) 07/13/2019   CREATININE 0.60 (L) 03/21/2019   CREATININE 0.74 (L) 11/30/2018    Lab Results  Component Value Date   HGBA1C 7.4 (H) 04/01/2019    Lipid Panel     Component Value Date/Time   CHOL 195 03/05/2015 0111   TRIG 78 03/05/2015 0111   HDL 45 03/05/2015 0111   CHOLHDL 4.3 03/05/2015 0111   VLDL 16 03/05/2015 0111   LDLCALC 134 (H) 03/05/2015 0111    BP Readings from Last 3 Encounters:  07/17/19 122/80  07/13/19 118/76  06/12/19 124/62    No Known Allergies  Medications Reviewed Today    Reviewed by Rudean Haskell, RPH (Pharmacist) on 07/24/19 at 1040  Med List Status: <None>  Medication Order Taking? Sig Documenting Provider Last Dose Status Informant  acetaminophen (TYLENOL) 500 MG tablet [id number] Yes Take 500 mg by mouth every 6 (six) hours as needed for mild pain or moderate pain. [provider] Taking Active Self  ALPRAZolam Duanne Moron) 0.5 MG tablet [id number] Yes TAKE 1 TABLET BY MOUTH THREE TIMES DAILYAS NEEDED FOR ANXIETY [name], MD Taking Active   AMBULATORY NON FORMULARY MEDICATION [id number]  CPAP mask and tubing  Patient not taking: Reported on 07/17/2019   [name], MD  Active   AMBULATORY NON [facility] MEDICATION [id number]  Portable oxygen concentrator, 3 L [place].  Please fax to aero care [name], MD  Active   apixaban (ELIQUIS) 5 MG TABS tablet [id number] Yes Take 1 tablet (5 mg total) by mouth 2 (two) times daily. [name], MD Taking Active   atorvastatin (LIPITOR) 80 MG tablet [id number] Yes Take 1 tablet (80 mg total) by mouth daily. [name], NP Taking Active   budesonide-formoterol [facility]) 160-4.5 MCG/ACT inhaler [id number] Yes Inhale 2 puffs into the lungs 2 (two) times daily. Marshell Garfinkel, MD Taking Active   carvedilol (COREG) 25 MG tablet [id number] Yes Take 1 tablet (25 mg total) by mouth 2 (two) times daily. [name], NP Taking Active  dofetilide (TIKOSYN) 500 MCG capsule [id number] Yes Take 1 capsule (500 mcg total) by mouth 2 (two) times daily. [name], NP Taking Active   furosemide (LASIX) 40 MG tablet [id number] Yes Take 1 tablet (40 mg total) by mouth 2 (two) times daily. [name], MD Taking Active   lisinopril (ZESTRIL) 10 MG tablet [id number] Yes Take 1 tablet (10 mg total) by mouth daily. Croitoru, Mihai, MD Taking Active   loratadine (CLARITIN) 10 MG tablet [id number] Yes Take 10 mg by mouth daily. [provider] Taking Active   Magnesium 400  MG TABS [id number] Yes Take 400 mg by mouth 2 (two) times a day. [name], MD Taking Active   montelukast (SINGULAIR) 10 MG tablet [id number] Yes Take 1 tablet (10 mg total) by mouth at bedtime. [name], MD Taking Active   pantoprazole (PROTONIX) 40 MG tablet [id number] Yes Take 1 tablet (40 mg total) by mouth daily. [name], MD Taking Active   sertraline (ZOLOFT) 50 MG tablet [id number] Yes Take 1 tablet (50 mg total) by mouth daily. [name], MD Taking Active         Discontinued 85/27/78 2423 (Duplicate)           Assessment: Drugs sorted by system:  Neurologic/Psychologic: alprazolam, sertraline  Hematologic: apixaban  Cardiovascular: atorvastatin, carvedilol, lisinopril, furosemide, dofetilide / magnesium  Pulmonary/Allergy: budesonide-formoterol inhaler, montelukast, claritin  Gastrointestinal: pantoprazole  Pain: acetaminophen  Medication Review Findings:  . Monitor Mg / K / Scr closely with Tikosyn.  Patient reports his has lab work scheduled in 2 weeks.   .  Medication Assistance Findings:  Medication assistance needs identified: Eliquis / Symbicort  Extra Help:  Not eligible for Extra Help as he does not have Medicare currently  Patient Assistance Programs: Symbicort  made by [employer] o Income requirement met: Yes o Out-of-pocket prescription expenditure met:   Not Applicable (patient does not have Medicare yet) - Patient has met application requirements to apply for this program.  - Reviewed program requirements with patient.    Eliquis made by BMS o Income requirement met: Yes o Out-of-pocket prescription expenditure met:   Not Applicable  (patient does not have Medicare yet) - Patient has met application requirements to apply for this program.  - Reviewed program requirements with patient.    Reviewed co-pay card options with patient for Symbicort and Eliquis.  Signed patient up for both and provided him  with contact information.  Patient will reach out to companies if he has questions about the coupon cards.  Unable to locate assistance for Tikosyn although patient does not have issues with cost at this time.  Reviewed PAN foundation with patient for possible use in 2021.  Will assist patient with applying also for the patient assistance programs and he can use the coupon cards until decision made for patient assistance programs.  Reviewed differences between commercial and Medicare programs and additional requirements once he has Medicare.  Patient voiced understanding.   Plan: . I will route patient assistance letter to [place] technician who will coordinate patient assistance program application process for medications listed above.  [facility] pharmacy technician will assist with obtaining all required documents from both patient and provider(s) and submit application(s) once completed.    Ralene Bathe, PharmD, [place] [phone]

## 2019-07-24 NOTE — Patient Outreach (Signed)
[place] [facility]) Care Management  07/24/2019  BENTLEE BENNINGFIELD Dec 12, 1956 [id number]                          Medication Assistance Referral  Referral From: [place]  Medication/Company: Eliquis / BMS Patient application portion:  Mailed Provider application portion: Faxed  to Dr. Alma Friendly  Medication/Company: Symbicort / [place] Patient application portion:  Mailed Provider application portion: Faxed  to B. Warner Mccreedy, FNP    Follow up:  Will follow up with patient in 7-10 business days to confirm application(s) have been received.  Maud Deed Chana Bode Ronald Certified Pharmacy Technician [place] Management Direct [phone]

## 2019-07-27 ENCOUNTER — Other Ambulatory Visit: Payer: Self-pay

## 2019-07-27 MED ORDER — BUDESONIDE-FORMOTEROL FUMARATE 160-4.5 MCG/ACT IN AERO: 2.0000 | INHALATION_SPRAY | Freq: Two times a day (BID) | RESPIRATORY_TRACT | 3 refills | Status: DC

## 2019-07-28 ENCOUNTER — Ambulatory Visit: Payer: BC Managed Care – PPO | Admitting: Pharmacist

## 2019-08-01 ENCOUNTER — Other Ambulatory Visit: Payer: Self-pay | Admitting: Pharmacy Technician

## 2019-08-01 NOTE — Patient Outreach (Signed)
[place] [facility]) Care Management  08/01/2019  RAYLEN TANGONAN [birth year] [id number]    Return call placed to patient regarding patient assistance application(s) for Eliquis and Symbicort , HIPAA identifiers verified. Mr. Leeman had a question about the patient assistance applications that were mailed out to him. I was able to answer the question for him and he states that he will finish filling forms out and mail back in.  Follow up:  Will submit to companies once all documents have been obtained.  Maud Deed Chana Bode [place] Certified Pharmacy Technician [place] Management Direct [phone]

## 2019-08-02 DIAGNOSIS — Z79899 Other long term (current) drug therapy: Secondary | ICD-10-CM | POA: Diagnosis not present

## 2019-08-02 DIAGNOSIS — R790 Abnormal level of blood mineral: Secondary | ICD-10-CM | POA: Diagnosis not present

## 2019-08-03 LAB — BASIC METABOLIC PANEL
BUN/Creatinine Ratio: 21 (ref 10–24)
BUN: 15 mg/dL (ref 8–27)
CO2: 31 mmol/L — ABNORMAL HIGH (ref 20–29)
Calcium: 9.5 mg/dL (ref 8.6–10.2)
Chloride: 95 mmol/L — ABNORMAL LOW (ref 96–106)
Creatinine, Ser: 0.73 mg/dL — ABNORMAL LOW (ref 0.76–1.27)
GFR calc Af Amer: 115 mL/min/{1.73_m2} (ref >59)
GFR calc non Af Amer: 99 mL/min/{1.73_m2} (ref >59)
Glucose: 128 mg/dL — ABNORMAL HIGH (ref 65–99)
Potassium: 4.7 mmol/L (ref 3.5–5.2)
Sodium: 140 mmol/L (ref 134–144)

## 2019-08-03 LAB — MAGNESIUM: Magnesium: 1.8 mg/dL (ref 1.6–2.3)

## 2019-08-04 ENCOUNTER — Other Ambulatory Visit: Payer: Self-pay | Admitting: *Deleted

## 2019-08-04 DIAGNOSIS — F4321 Adjustment disorder with depressed mood: Secondary | ICD-10-CM

## 2019-08-04 MED ORDER — SERTRALINE HCL 50 MG PO TABS: 50.0000 mg | ORAL_TABLET | Freq: Every day | ORAL | 1 refills | Status: DC

## 2019-08-04 MED ORDER — ALPRAZOLAM 0.5 MG PO TABS: ORAL_TABLET | ORAL | 0 refills | Status: AC

## 2019-08-07 ENCOUNTER — Telehealth: Payer: Self-pay | Admitting: Internal Medicine

## 2019-08-07 DIAGNOSIS — I2510 Atherosclerotic heart disease of native coronary artery without angina pectoris: Secondary | ICD-10-CM | POA: Diagnosis not present

## 2019-08-07 DIAGNOSIS — I509 Heart failure, unspecified: Secondary | ICD-10-CM | POA: Diagnosis not present

## 2019-08-07 DIAGNOSIS — I482 Chronic atrial fibrillation, unspecified: Secondary | ICD-10-CM | POA: Diagnosis not present

## 2019-08-07 NOTE — Telephone Encounter (Signed)
Pt called he is still taking mag oxide 400mg  bid... he had labs 08/02/19 Mag level was 1.8.  Pt now worried he will eventually be taking too much and is asking how long he should plan to be taking this dose? Will forward to Dr. Debara Pickett for review.   Pt denies palpitations and says he has been feeling well.

## 2019-08-07 NOTE — Telephone Encounter (Signed)
He will probably stay on that indefinitely - his goal magnesium is >2 - however, we are managing to stay around 1.8 on his current dose - should be 400 mg BID - I'm hesitant to give more due to risk of diarrhea.  Dr. Lemmie Evens

## 2019-08-07 NOTE — Telephone Encounter (Signed)
LMTCB on pts mobile/ home number.

## 2019-08-07 NOTE — Telephone Encounter (Signed)
New Message    Pt c/o medication issue:  1. Name of Medication: Magnesium 400 MG TABS   2. How are you currently taking this medication (dosage and times per day)?   3. Are you having a reaction (difficulty breathing--STAT)?   4. What is your medication issue? Patient is calling to see how long he should be taking magnesium tablets. Please call.

## 2019-08-09 ENCOUNTER — Other Ambulatory Visit: Payer: Self-pay | Admitting: *Deleted

## 2019-08-09 DIAGNOSIS — F4321 Adjustment disorder with depressed mood: Secondary | ICD-10-CM

## 2019-08-09 MED ORDER — SERTRALINE HCL 50 MG PO TABS: 50.0000 mg | ORAL_TABLET | Freq: Every day | ORAL | 0 refills | Status: DC

## 2019-08-09 NOTE — Telephone Encounter (Signed)
Pt updated with MD's recommendation and voiced understanding. 

## 2019-08-09 NOTE — Telephone Encounter (Signed)
  Patient is following up on prior messge regarding taking magnesium

## 2019-08-11 ENCOUNTER — Ambulatory Visit (INDEPENDENT_AMBULATORY_CARE_PROVIDER_SITE_OTHER)
Admission: RE | Admit: 2019-08-11 | Discharge: 2019-08-11 | Disposition: A | Discharge status: Home / Self Care | Payer: BC Managed Care – PPO | Source: Ambulatory Visit | Attending: Pulmonary Disease | Admitting: Pulmonary Disease

## 2019-08-11 ENCOUNTER — Other Ambulatory Visit: Payer: Self-pay

## 2019-08-11 ENCOUNTER — Telehealth: Payer: Self-pay | Admitting: Pulmonary Disease

## 2019-08-11 DIAGNOSIS — R0602 Shortness of breath: Secondary | ICD-10-CM

## 2019-08-11 DIAGNOSIS — J9811 Atelectasis: Secondary | ICD-10-CM | POA: Diagnosis not present

## 2019-08-11 NOTE — Telephone Encounter (Signed)
Notes recorded by Lauraine Rinne, NP on 08/11/2019 at 3:32 PM EDT  High-resolution CT chest results have come back. Showing no interstitial lung disease. Showing some mild postinfectious or postinflammatory scarring.   No changes to plan of care at this time.   Patient does need to be scheduled for follow-up with Dr. Vaughan Browner as initially instructed at the last office visit in August/2020.   Wyn Quaker, FNP  [facility]

## 2019-08-11 NOTE — Progress Notes (Signed)
High-resolution CT chest results have come back.  Showing no interstitial lung disease. Showing some mild postinfectious or postinflammatory scarring.  No changes to plan of care at this time.  Patient does need to be scheduled for follow-up with Dr. Vaughan Browner as initially instructed at the last office visit in August/2020.  Wyn Quaker, FNP

## 2019-08-14 NOTE — Telephone Encounter (Signed)
Pt returning call for results.  Call got disconnected when he was called.

## 2019-08-14 NOTE — Telephone Encounter (Signed)
Spoke with pt, aware of results/recs.  Scheduled rov with Dr. Vaughan Browner.  Nothing further needed at this time- will close encounter.

## 2019-08-15 ENCOUNTER — Other Ambulatory Visit: Payer: Self-pay

## 2019-08-20 DIAGNOSIS — I482 Chronic atrial fibrillation, unspecified: Secondary | ICD-10-CM | POA: Diagnosis not present

## 2019-08-20 DIAGNOSIS — I5020 Unspecified systolic (congestive) heart failure: Secondary | ICD-10-CM | POA: Diagnosis not present

## 2019-08-20 DIAGNOSIS — I2510 Atherosclerotic heart disease of native coronary artery without angina pectoris: Secondary | ICD-10-CM | POA: Diagnosis not present

## 2019-08-28 ENCOUNTER — Ambulatory Visit: Payer: BC Managed Care – PPO | Admitting: Pulmonary Disease

## 2019-08-28 ENCOUNTER — Other Ambulatory Visit (HOSPITAL_COMMUNITY)
Admission: RE | Admit: 2019-08-28 | Discharge: 2019-08-28 | Disposition: A | Discharge status: Home / Self Care | Payer: BC Managed Care – PPO | Source: Ambulatory Visit | Attending: Pulmonary Disease | Admitting: Pulmonary Disease

## 2019-08-28 DIAGNOSIS — Z20828 Contact with and (suspected) exposure to other viral communicable diseases: Secondary | ICD-10-CM | POA: Insufficient documentation

## 2019-08-28 DIAGNOSIS — Z01812 Encounter for preprocedural laboratory examination: Secondary | ICD-10-CM | POA: Insufficient documentation

## 2019-08-29 LAB — NOVEL CORONAVIRUS, NAA (HOSP ORDER, SEND-OUT TO REF LAB; TAT 18-24 HRS): SARS-CoV-2, NAA: NOT DETECTED

## 2019-08-31 ENCOUNTER — Ambulatory Visit (INDEPENDENT_AMBULATORY_CARE_PROVIDER_SITE_OTHER): Payer: BC Managed Care – PPO | Admitting: Pulmonary Disease

## 2019-08-31 ENCOUNTER — Encounter: Payer: Self-pay | Admitting: Pulmonary Disease

## 2019-08-31 ENCOUNTER — Other Ambulatory Visit: Payer: Self-pay

## 2019-08-31 VITALS — BP 122/78 | HR 66 | Temp 96.3°F | Ht 72.0 in | Wt 364.0 lb

## 2019-08-31 DIAGNOSIS — R0602 Shortness of breath: Secondary | ICD-10-CM

## 2019-08-31 DIAGNOSIS — J449 Chronic obstructive pulmonary disease, unspecified: Secondary | ICD-10-CM

## 2019-08-31 LAB — PULMONARY FUNCTION TEST
DL/VA % pred: 100 %
DL/VA: 4.18 ml/min/mmHg/L
DLCO unc % pred: 73 %
DLCO unc: 21.31 ml/min/mmHg
FEF 25-75 Post: 1.48 L/sec
FEF 25-75 Pre: 1.09 L/sec
FEF2575-%Change-Post: 35 %
FEF2575-%Pred-Post: 48 %
FEF2575-%Pred-Pre: 35 %
FEV1-%Change-Post: 10 %
FEV1-%Pred-Post: 54 %
FEV1-%Pred-Pre: 49 %
FEV1-Post: 2.06 L
FEV1-Pre: 1.86 L
FEV1FVC-%Change-Post: 2 %
FEV1FVC-%Pred-Pre: 87 %
FEV6-%Change-Post: 8 %
FEV6-%Pred-Post: 62 %
FEV6-%Pred-Pre: 57 %
FEV6-Post: 3.02 L
FEV6-Pre: 2.78 L
FEV6FVC-%Change-Post: -1 %
FEV6FVC-%Pred-Post: 103 %
FEV6FVC-%Pred-Pre: 104 %
FVC-%Change-Post: 8 %
FVC-%Pred-Post: 60 %
FVC-%Pred-Pre: 56 %
FVC-Post: 3.07 L
FVC-Pre: 2.84 L
Post FEV1/FVC ratio: 67 %
Post FEV6/FVC ratio: 98 %
Pre FEV1/FVC ratio: 66 %
Pre FEV6/FVC Ratio: 100 %
RV % pred: 133 %
RV: 3.19 L
TLC % pred: 81 %
TLC: 6.04 L

## 2019-08-31 MED ORDER — ANORO ELLIPTA 62.5-25 MCG/INH IN AEPB: 1.0000 | INHALATION_SPRAY | Freq: Every day | RESPIRATORY_TRACT | 5 refills | Status: DC

## 2019-08-31 NOTE — Progress Notes (Signed)
Arthur Church    [id number]    January 11, 1956  Primary Church Physician:Church, Arthur Her, MD  Referring Physician: Silverio Church, [facility] [address] [place],  [place] [address]  Chief complaint: Follow-up for COPD, dyspnea  HPI: 63 year old with history of non ischemic cardiomyopathy, atrial fibrillation, obesity Complains of dyspnea on exertion for the past 4 to 6 months.  Has no symptoms at rest Recently started on supplemental oxygen by primary Church for desats. Has cough with white mucus, chest congestion.  Follows with Dr. Debara Church for cardiac issues.  Noted to have EF 20% in the past but echocardiogram in 2016 shows recovery of systolic function.  Pets: No pets Occupation: Works as a Presenter, broadcasting for Arthur Church: No known exposures, no mold, hot tub, Jacuzzi Smoking history: 20-30-pack-year smoker.  Quit in 2013 Travel history: Generally from [place].  No significant recent travel Relevant family history: No family history COPD due to smoking  Interim history:  Started on Symbicort at last visit.  States that it helped somewhat with his breathing Still continues to have dyspnea on exertion, cough with white mucus He had a sleep study recently which did not demonstrate sleep apnea but there was desaturations and periodic limb movements.  Outpatient Encounter Medications as of 08/31/2019  Medication Sig  . acetaminophen (TYLENOL) 500 MG tablet Take 500 mg by mouth every 6 (six) hours as needed for mild pain or moderate pain.  Marland Kitchen ALPRAZolam (XANAX) 0.5 MG tablet TAKE 1 TABLET BY MOUTH THREE TIMES DAILYAS NEEDED FOR ANXIETY  . AMBULATORY NON FORMULARY MEDICATION Portable oxygen concentrator, 3 L Arthur Church.  Please fax to Arthur Church  . apixaban (ELIQUIS) 5 MG TABS tablet Take 1 tablet (5 mg total) by mouth 2 (two) times daily.  Marland Kitchen atorvastatin (LIPITOR) 80 MG tablet Take 1 tablet (80 mg total) by mouth daily.  . budesonide-formoterol (SYMBICORT)  160-4.5 MCG/ACT inhaler Inhale 2 puffs into the lungs 2 (two) times daily.  . carvedilol (COREG) 25 MG tablet Take 1 tablet (25 mg total) by mouth 2 (two) times daily.  Marland Kitchen dofetilide (TIKOSYN) 500 MCG capsule Take 1 capsule (500 mcg total) by mouth 2 (two) times daily.  . furosemide (LASIX) 40 MG tablet Take 1 tablet (40 mg total) by mouth 2 (two) times daily.  Marland Kitchen lisinopril (ZESTRIL) 10 MG tablet Take 1 tablet (10 mg total) by mouth daily.  Marland Kitchen loratadine (CLARITIN) 10 MG tablet Take 10 mg by mouth daily.  . Magnesium 400 MG TABS Take 400 mg by mouth 2 (two) times a day.  . montelukast (SINGULAIR) 10 MG tablet Take 1 tablet (10 mg total) by mouth at bedtime.  . pantoprazole (PROTONIX) 40 MG tablet Take 1 tablet (40 mg total) by mouth daily.  . sertraline (ZOLOFT) 50 MG tablet Take 1 tablet (50 mg total) by mouth daily.  . AMBULATORY NON FORMULARY MEDICATION CPAP mask and tubing (Patient not taking: Reported on 07/17/2019)   No facility-administered encounter medications on file as of 08/31/2019.    Physical Exam: Blood pressure 122/78, pulse 66, temperature (!) 96.3 F (35.7 C), temperature source Temporal, height 6' (1.829 m), weight (!) 364 lb (165.1 kg), SpO2 91 %. Gen:      No acute distress, obese HEENT:  EOMI, sclera anicteric Neck:     No masses; no thyromegaly Lungs:    Clear to auscultation bilaterally; normal respiratory effort CV:         Regular  rate and rhythm; no murmurs Abd:      + bowel sounds; soft, non-tender; no palpable masses, no distension Ext:    No edema; adequate peripheral perfusion Skin:      Warm and dry; no rash Neuro: alert and oriented x 3 Psych: normal mood and affect  Data Reviewed: Imaging: CT chest 04/06/2015- atelectasis at the lung base.   Chest x-ray 03/21/2019- bibasal scarring.   High-resolution CT 08/11/2019- no ILD.  Atelectasis at the left lung base.  Mild scarring.  Dilated pulmonary artery, coronary atherosclerosis, hepatic steatosis, mild right  paratracheal lymphadenopathy.  I have reviewed the images personally.  Lower extremity ultrasound 04/03/2019-no evidence of DVT  PFTs 9/70/20 FVC 3.07 [60%], FEV1 2.06 [50%],/F 67, TLC 6.04 [81%], DLCO 21.31 [73%] Severe obstruction with air trapping, minimal diffusion defect  Labs: CBC 03/29/2015-WBC 12, eos 3.3%, absolute eosinophil count 396 CBC 06/12/2019-WBC 12.5, eos 2.2%, absolute eosinophilic count 275 IgE 05/23/19-10 Alpha-1 antitrypsin 06/12/19-167, PI MM  Cardiac: Echocardiogram 08/23/2015- Mild LVH, LVEF 55-60%, grade 1 diastolic dysfunction.  Sleep: PSG 07/10/2019- no OSA.  AHI 3.6.  Desaturation to 83%.  Clinically significant periodic limb movements.  Assessment:  Severe COPD, dyspnea Dyspnea is multifactorial from diastolic heart failure, obesity, COPD PFTs reviewed with severe obstruction and air trapping We will stop Symbicort and start him on Anoro.  I do not believe he will require inhaled corticosteroids as there is no history of exacerbations  No evidence of interstitial lung disease on CT.  He follows with cardiology for diastolic heart failure There is dilatation in the pulmonary arteries suggestive of pulmonary hypertension.  We discussed echocardiogram for further evaluation.  He would like to hold off until he gets his new insurance as he is in the middle of retiring from his job  Nocturnal hypoxia No evidence of OSA.  He does have periodic limb movements. Continue monitoring Continue supplemental oxygen with exertion and at night  Plan/Recommendations: - Stop Symbicort, start Anoro - Supplemental oxygen  Arthur Greathouse MD [facility] 08/31/2019, 12:14 PM  CC: Arthur Church,*

## 2019-08-31 NOTE — Progress Notes (Signed)
PFT done today. 

## 2019-08-31 NOTE — Progress Notes (Signed)
Will be discussed with pt at [place] 08/31/2019 with Dr. Vaughan Browner.  Wyn Quaker FNP

## 2019-08-31 NOTE — Patient Instructions (Signed)
I have reviewed your lung function test which shows severe COPD We will stop the Symbicort and start you on an inhaler called Anoro Continue Supplemental oxygen  Follow-up in 3 months.

## 2019-09-06 ENCOUNTER — Other Ambulatory Visit: Payer: Self-pay | Admitting: Pharmacist

## 2019-09-06 NOTE — Patient Outreach (Signed)
[facility]) Care Management  [place]  09/06/2019  RENEL ENDE May 25, 1956 [id number]  Reason for call: f/u on patient assistance program applications -Patient has not returned applications for Xarelto and Symbicort.  If he still has Pharmacist, community, he is not eligible for these programs.  If he no longer has Conservator, museum/gallery, then can continue with application process.   Outreach:  Unsuccessful telephone call attempt #1 to patient.   HIPAA compliant voicemail left requesting a return call  Plan:  -I will make another outreach attempt to patient within 3-4 business days.    Ralene Bathe, PharmD, [place] [phone]

## 2019-09-07 ENCOUNTER — Other Ambulatory Visit: Payer: Self-pay | Admitting: Pharmacist

## 2019-09-07 DIAGNOSIS — I2510 Atherosclerotic heart disease of native coronary artery without angina pectoris: Secondary | ICD-10-CM | POA: Diagnosis not present

## 2019-09-07 DIAGNOSIS — I509 Heart failure, unspecified: Secondary | ICD-10-CM | POA: Diagnosis not present

## 2019-09-07 DIAGNOSIS — I482 Chronic atrial fibrillation, unspecified: Secondary | ICD-10-CM | POA: Diagnosis not present

## 2019-09-07 NOTE — Patient Outreach (Signed)
[place] [facility]) Care Management  [place] 09/07/2019  Arthur Church [birth year] [id number]  Incoming call and voicemail from patient.   Successful return call.  Patient reports he still has Pharmacist, community Nurse, mental health) but may change to [facility] Medicare in 2021.  We reviewed that he is NOT eligible for patient assistance programs through drug companies since he currently has Pharmacist, community.  Patient voiced understanding.  He was able to secure coupon card for Symbicort and reports co-pay for Eliquis is affordable.    Plan: Will close [facility] pharmacy case at this time.  Encouraged patient to reach out to me next year if he enrolls in [facility] plan and we can discuss eligibility for patient assistance programs.  Patient has my contact information.   Ralene Bathe, PharmD, [place] [phone]

## 2019-09-12 ENCOUNTER — Ambulatory Visit: Payer: BC Managed Care – PPO | Admitting: Pharmacist

## 2019-09-19 DIAGNOSIS — I2510 Atherosclerotic heart disease of native coronary artery without angina pectoris: Secondary | ICD-10-CM | POA: Diagnosis not present

## 2019-09-19 DIAGNOSIS — I5020 Unspecified systolic (congestive) heart failure: Secondary | ICD-10-CM | POA: Diagnosis not present

## 2019-09-19 DIAGNOSIS — I482 Chronic atrial fibrillation, unspecified: Secondary | ICD-10-CM | POA: Diagnosis not present

## 2019-09-22 ENCOUNTER — Telehealth: Payer: Self-pay | Admitting: Pulmonary Disease

## 2019-09-22 NOTE — Telephone Encounter (Signed)
Spoke with pt, advised him that I left samples of Anoro up front for him to pick up. Nothing further is needed.

## 2019-09-25 MED ORDER — ANORO ELLIPTA 62.5-25 MCG/INH IN AEPB: 1.0000 | INHALATION_SPRAY | Freq: Every day | RESPIRATORY_TRACT | 0 refills | Status: DC

## 2019-10-07 DIAGNOSIS — I2510 Atherosclerotic heart disease of native coronary artery without angina pectoris: Secondary | ICD-10-CM | POA: Diagnosis not present

## 2019-10-07 DIAGNOSIS — I509 Heart failure, unspecified: Secondary | ICD-10-CM | POA: Diagnosis not present

## 2019-10-07 DIAGNOSIS — I482 Chronic atrial fibrillation, unspecified: Secondary | ICD-10-CM | POA: Diagnosis not present

## 2019-11-09 ENCOUNTER — Other Ambulatory Visit: Payer: Self-pay | Admitting: Internal Medicine

## 2019-11-30 ENCOUNTER — Other Ambulatory Visit: Payer: Self-pay | Admitting: Sports Medicine

## 2019-11-30 DIAGNOSIS — K219 Gastro-esophageal reflux disease without esophagitis: Secondary | ICD-10-CM

## 2019-11-30 MED ORDER — PANTOPRAZOLE SODIUM 40 MG PO TBEC: 40.0000 mg | DELAYED_RELEASE_TABLET | Freq: Every day | ORAL | 1 refills | Status: AC

## 2020-01-09 ENCOUNTER — Ambulatory Visit: Payer: BC Managed Care – PPO | Admitting: Pulmonary Disease

## 2020-01-30 ENCOUNTER — Other Ambulatory Visit: Payer: Self-pay | Admitting: Sports Medicine

## 2020-01-30 DIAGNOSIS — R059 Cough, unspecified: Secondary | ICD-10-CM

## 2020-01-30 DIAGNOSIS — R05 Cough: Secondary | ICD-10-CM

## 2020-01-30 MED ORDER — MONTELUKAST SODIUM 10 MG PO TABS: 10.0000 mg | ORAL_TABLET | Freq: Every day | ORAL | 3 refills | Status: DC

## 2020-03-07 ENCOUNTER — Other Ambulatory Visit: Payer: Self-pay

## 2020-03-07 ENCOUNTER — Ambulatory Visit (INDEPENDENT_AMBULATORY_CARE_PROVIDER_SITE_OTHER): Payer: BLUE CROSS/BLUE SHIELD | Admitting: Internal Medicine

## 2020-03-07 ENCOUNTER — Encounter: Payer: Self-pay | Admitting: Internal Medicine

## 2020-03-07 VITALS — BP 124/84 | HR 70 | Temp 97.3°F | Ht 73.0 in | Wt 383.8 lb

## 2020-03-07 DIAGNOSIS — R0602 Shortness of breath: Secondary | ICD-10-CM

## 2020-03-07 DIAGNOSIS — I480 Paroxysmal atrial fibrillation: Secondary | ICD-10-CM | POA: Diagnosis not present

## 2020-03-07 DIAGNOSIS — Z79899 Other long term (current) drug therapy: Secondary | ICD-10-CM

## 2020-03-07 DIAGNOSIS — I428 Other cardiomyopathies: Secondary | ICD-10-CM

## 2020-03-07 NOTE — Patient Instructions (Signed)
Medication Instructions:  Your physician recommends that you continue on your current medications as directed. Please refer to the Current Medication list given to you today.  *If you need a refill on your cardiac medications before your next appointment, please call your pharmacy*   Lab Work: LAB WORK TODAY - BMET, BNP, Magnesium   If you have labs (blood work) drawn today and your tests are completely normal, you will receive your results only by: Marland Kitchen MyChart Message (if you have MyChart) OR . A paper copy in the mail If you have any lab test that is abnormal or we need to change your treatment, we will call you to review the results.   Testing/Procedures: NONE   Follow-Up: At [facility], you and your health needs are our priority.  As part of our continuing mission to provide you with exceptional heart care, we have created designated Provider Care Teams.  These Care Teams include your primary Cardiologist (physician) and Advanced Practice Providers (APPs -  Physician Assistants and Nurse Practitioners) who all work together to provide you with the care you need, when you need it.  We recommend signing up for the patient portal called "MyChart".  Sign up information is provided on this After Visit Summary.  MyChart is used to connect with patients for Virtual Visits (Telemedicine).  Patients are able to view lab/test results, encounter notes, upcoming appointments, etc.  Non-urgent messages can be sent to your provider as well.   To learn more about what you can do with MyChart, go to [email or web address].    Your next appointment:   6 month(s)  The format for your next appointment:   In Person  Provider:   You may see Chrystie Nose, MD or one of the following Advanced Practice Providers on your designated Care Team:    Azalee Course, PA-C  Micah Flesher, PA-C or   Judy Pimple, [place]    Other Instructions
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
## 2020-03-08 ENCOUNTER — Encounter: Payer: Self-pay | Admitting: Internal Medicine

## 2020-03-08 LAB — BASIC METABOLIC PANEL
BUN/Creatinine Ratio: 28 — ABNORMAL HIGH (ref 10–24)
BUN: 19 mg/dL (ref 8–27)
CO2: 30 mmol/L — ABNORMAL HIGH (ref 20–29)
Calcium: 9.3 mg/dL (ref 8.6–10.2)
Chloride: 96 mmol/L (ref 96–106)
Creatinine, Ser: 0.68 mg/dL — ABNORMAL LOW (ref 0.76–1.27)
GFR calc Af Amer: 118 mL/min/{1.73_m2} (ref >59)
GFR calc non Af Amer: 102 mL/min/{1.73_m2} (ref >59)
Glucose: 134 mg/dL — ABNORMAL HIGH (ref 65–99)
Potassium: 4.5 mmol/L (ref 3.5–5.2)
Sodium: 140 mmol/L (ref 134–144)

## 2020-03-08 LAB — MAGNESIUM: Magnesium: 1.8 mg/dL (ref 1.6–2.3)

## 2020-03-08 LAB — BRAIN NATRIURETIC PEPTIDE: BNP: 83 pg/mL (ref 0.0–100.0)

## 2020-03-11 ENCOUNTER — Telehealth: Payer: Self-pay | Admitting: Licensed Clinical Social Worker

## 2020-03-11 ENCOUNTER — Telehealth: Payer: Self-pay | Admitting: Internal Medicine

## 2020-03-11 NOTE — Telephone Encounter (Signed)
Patient is calling for lab results.

## 2020-03-11 NOTE — Telephone Encounter (Signed)
CSW referred to assist patient with navigating his disability application. Patient reports he applied over 6 months ago and recently was told that his application is in medical review. Patient no longer has the BC/BS as he is not employed. He states he purchased a Financial controller for $40 a month name [employer] and it does not cover his [facility] MD's and must go to [facility] for all his care. CSW explained Social Security disability and typically Medicare is not included with disability until the patient has been disabled for 2 years. Patient reports "I can't wait two years to see my MD so might have to use the [facility] Local longer than I thought and go to [facility] MD's". CSW will explore options for care through [facility] within [facility] network. Patient verbalizes he has all his prescriptions at this time and denies any financial needs at the moment. CSW will follow up with patient after exploring options. Patient grateful for the call.  Lasandra Beech, LCSW, CCSW-MCS [phone]

## 2020-03-11 NOTE — Telephone Encounter (Signed)
Contacted patient, gave lab results. Will notify Primary.  Thanks!

## 2020-03-13 ENCOUNTER — Telehealth: Payer: Self-pay | Admitting: Licensed Clinical Social Worker

## 2020-03-13 NOTE — Telephone Encounter (Signed)
CSW explored options with [facility] Enrollment and clarified that [employer] does not have a contract for services with [facility]. CSW contacted patient to inform that [facility] would be out of network for services with [facility] Local. Patient appreciative of the call. CSW available if needed. Lasandra Beech, LCSW, CCSW-MCS [phone]

## 2020-03-18 ENCOUNTER — Other Ambulatory Visit: Payer: Self-pay | Admitting: *Deleted

## 2020-03-18 MED ORDER — BUDESONIDE-FORMOTEROL FUMARATE 160-4.5 MCG/ACT IN AERO: 2.0000 | INHALATION_SPRAY | Freq: Two times a day (BID) | RESPIRATORY_TRACT | 0 refills | Status: DC

## 2020-04-08 ENCOUNTER — Ambulatory Visit: Payer: BLUE CROSS/BLUE SHIELD | Admitting: Pulmonary Disease

## 2020-04-22 ENCOUNTER — Other Ambulatory Visit: Payer: Self-pay | Admitting: Internal Medicine

## 2020-04-23 ENCOUNTER — Other Ambulatory Visit: Payer: Self-pay

## 2020-05-27 ENCOUNTER — Telehealth: Payer: Self-pay | Admitting: Internal Medicine

## 2020-05-27 NOTE — Telephone Encounter (Signed)
*  STAT* If patient is at the pharmacy, call can be transferred to refill team.   1. Which medications need to be refilled? (please list name of each medication and dose if known)  Pt is going out of town- need this called in, he will run out while on vacation- Furosemide  2. Which pharmacy/location (including street and city if local pharmacy) is medication to be sent to?[facility] [address] and [address] [place], [place]   3. Do they need a 30 day or 90 day supply? #60

## 2020-05-28 MED ORDER — FUROSEMIDE 40 MG PO TABS: ORAL_TABLET | ORAL | 3 refills | Status: DC

## 2020-08-08 DIAGNOSIS — I5032 Chronic diastolic (congestive) heart failure: Secondary | ICD-10-CM | POA: Diagnosis present

## 2020-09-10 ENCOUNTER — Telehealth: Payer: Self-pay | Admitting: Internal Medicine

## 2020-09-10 NOTE — Telephone Encounter (Signed)
Spoke with patient about scheduling follow up with [facility] from recall list, patient stated they would return call later to schedule because they are driving at the moment

## 2020-09-16 ENCOUNTER — Telehealth: Payer: Self-pay | Admitting: Licensed Clinical Social Worker

## 2020-09-16 NOTE — Telephone Encounter (Signed)
CSW requested to contact patient at his request. CSW spoke with patient who states that he is unable to see [name] as his current insurance does not cover [employer]. Patient states that he hopes to return to [name] and [facility] services once he qualifies for Medicare in 1.5 years. Patient requested CSW to pass information on to staff. CSW will forward message to Lorelle Gibbs, RN Materials engineer at [employer]. Lasandra Beech, LCSW, CCSW-MCS [phone]

## 2020-09-19 DIAGNOSIS — E782 Mixed hyperlipidemia: Secondary | ICD-10-CM | POA: Insufficient documentation

## 2020-09-20 LAB — COLOGUARD: COLOGUARD: NEGATIVE

## 2020-10-08 DIAGNOSIS — G4733 Obstructive sleep apnea (adult) (pediatric): Secondary | ICD-10-CM | POA: Insufficient documentation

## 2020-10-30 ENCOUNTER — Telehealth: Payer: Self-pay | Admitting: Internal Medicine

## 2020-10-30 NOTE — Telephone Encounter (Signed)
Spoke with patient about scheduling appt, he stated that he would not like to schedule due to having new insurance that is Out of network for [employer].

## 2021-07-01 ENCOUNTER — Other Ambulatory Visit: Payer: Self-pay | Admitting: Internal Medicine

## 2022-03-13 ENCOUNTER — Telehealth: Payer: Self-pay | Admitting: Internal Medicine

## 2022-03-13 NOTE — Telephone Encounter (Signed)
Patient called to add the his BP was 220/120, that after an hour it goes back to normal.  ?

## 2022-03-13 NOTE — Telephone Encounter (Signed)
Patient scheduled for visit on 03/17/22 with Verdon Cummins NP ?

## 2022-03-13 NOTE — Progress Notes (Deleted)
? ?Cardiology Clinic Note  ? ?Patient Name: Arthur Church ?Date of Encounter: 03/13/2022 ? ?Primary Care Provider:  Virgilio Belling, PA-C ?Primary Cardiologist:  Chrystie Nose, MD ? ?Patient Profile  ?  ? Arthur Church 66 year old male presents to the clinic today for an evaluation of his chest discomfort. ? ?Past Medical History  ?  ?Past Medical History:  ?Diagnosis Date  ? Chronic systolic CHF (congestive heart failure) (HCC)   ? a. 2D ECHO (03/06/15) EF 20-25%, diffuse HK. Asc aortic diameter: 32mm. Mild LA dilation, mild RV dilation. Mild RV systolic dysfunction   b. LifeVest placed on 02/2015 admissoin   ? Coronary artery disease   ? a. 70% LAD lesion  ? Elevated TSH   ? ETOH abuse   ? Hypertension   ? Morbid obesity (HCC)   ? a. BMI ~46  ? Persistent atrial fibrillation (HCC)   ? a. newly dx 02/2015 admission. s/p successful TEE/DCCV  b. on Eliquis  ? Pre-diabetes   ? a. HgA1c 6.1  ? ?Past Surgical History:  ?Procedure Laterality Date  ? CARDIAC CATHETERIZATION N/A 04/26/2015  ? Procedure: Left Heart Cath and Coronary Angiography;  Surgeon: Lyn Records, MD;  Location: [facility] INVASIVE CV LAB;  Service: Cardiovascular;  Laterality: N/A;  ? CARDIOVERSION N/A 03/06/2015  ? Procedure: CARDIOVERSION;  Surgeon: Laurey Morale, MD;  Location: [facility] ENDOSCOPY;  Service: Cardiovascular;  Laterality: N/A;  ? HERNIA REPAIR    ? TEE WITHOUT CARDIOVERSION N/A 03/06/2015  ? Procedure: TRANSESOPHAGEAL ECHOCARDIOGRAM (TEE);  Surgeon: Laurey Morale, MD;  Location: [facility] ENDOSCOPY;  Service: Cardiovascular;  Laterality: N/A;  ? TEE WITHOUT CARDIOVERSION N/A 05/02/2015  ? Procedure: TRANSESOPHAGEAL ECHOCARDIOGRAM (TEE);  Surgeon: Lewayne Bunting, MD;  Location: [facility] ENDOSCOPY;  Service: Cardiovascular;  Laterality: N/A;  ? ? ?Allergies ? ?No Known Allergies ? ?History of Present Illness  ?  ? Arthur Church has a PMH of essential hypertension, paroxysmal atrial fibrillation, nonischemic cardiomyopathy, chronic respiratory failure with  hypoxemia, laryngeal pharyngeal reflux disease, morbid obesity, EtOH abuse, shortness of breath, and COPD. ? ?  He presented to [facility] 03/05/2015 with dyspnea, weight gain, lower extremity swelling, and chest pain.  He was found to have new onset CHF and atrial flutter.  His CHA2DS2-VASc score was 2 for CHF and hypertension.  He was started on Eliquis.  He underwent TEE and DCCV.  His EF was noted to be 15-20% and he was also noted to have mild Arthur.  On follow-up he was maintaining sinus rhythm and was noted to have had frequent PACs on telemetry.  He was continued on his carvedilol.  He ultimately reverted back to atrial fibrillation. ? ?He was seen in follow-up by Dr. Rennis Golden on 03/08/2020.  He noted weight gain of about 20 pounds.  He denied any lower extremity swelling or shortness of breath.  His EKG showed sinus rhythm with first-degree AV block and PACs with a rate of 70 bpm.  He was asymptomatic with his atrial fibrillation. ? ?He contacted the nurse triage line on 03/13/2022 and reported episodes of chest discomfort.  He rated the pain as a 9 out of 10 across his chest shoulder to shoulder.  This discomfort would last for 10 to 30 minutes and went away with getting out of bed and sitting in a chair.  He reported onset of pain with getting up to void at night.  He reported the pain had been ongoing for about 3 weeks.  He had presented to his PCP 1 week ago and reported normal EKG.  Blood pressure 110/60.  He reported elevated blood pressure.  ED precautions were reviewed.  He expressed understanding. ? ?He presents to the clinic today for evaluation states*** ? ?*** denies chest pain, shortness of breath, lower extremity edema, fatigue, palpitations, melena, hematuria, hemoptysis, diaphoresis, weakness, presyncope, syncope, orthopnea, and PND. ? ? ?Home Medications  ?  ?Prior to Admission medications   ?Medication Sig Start Date End Date Taking? Authorizing Provider  ?acetaminophen (TYLENOL) 500 MG  tablet Take 500 mg by mouth every 6 (six) hours as needed for mild pain or moderate pain.    [provider]  ?ALPRAZolam Prudy Feeler) 0.5 MG tablet TAKE 1 TABLET BY MOUTH THREE TIMES DAILYAS NEEDED FOR ANXIETY 08/04/19   Monica Becton, MD  ?AMBULATORY NON FORMULARY MEDICATION CPAP mask and tubing 03/03/19   Monica Becton, MD  ?AMBULATORY NON FORMULARY MEDICATION Portable oxygen concentrator, 3 L [place]. ? ?Please fax to [facility] 03/17/19   Monica Becton, MD  ?apixaban (ELIQUIS) 5 MG TABS tablet Take 1 tablet (5 mg total) by mouth 2 (two) times daily. 02/27/19   Hilty, Lisette Abu, MD  ?atorvastatin (LIPITOR) 80 MG tablet Take 1 tablet (80 mg total) by mouth daily. 05/24/19   Jodelle Gross, NP  ?budesonide-formoterol [facility]) 160-4.5 MCG/ACT inhaler Inhale 2 puffs into the lungs 2 (two) times daily. 03/18/20   Mannam, Colbert Coyer, MD  ?carvedilol (COREG) 25 MG tablet Take 1 tablet (25 mg total) by mouth 2 (two) times daily. 05/31/19   Jodelle Gross, NP  ?dofetilide (TIKOSYN) 500 MCG capsule Take 1 capsule (500 mcg total) by mouth 2 (two) times daily. 05/31/19   Jodelle Gross, NP  ?furosemide (LASIX) 40 MG tablet TAKE 1 TABLET(40 MG) BY MOUTH TWICE DAILY 07/01/21   Hilty, Lisette Abu, MD  ?lisinopril (ZESTRIL) 10 MG tablet Take 1 tablet (10 mg total) by mouth daily. 06/12/19   Croitoru, Mihai, MD  ?loratadine (CLARITIN) 10 MG tablet Take 10 mg by mouth daily.    [provider]  ?Magnesium 400 MG TABS Take 400 mg by mouth 2 (two) times a day. 07/14/19   Hilty, Lisette Abu, MD  ?pantoprazole (PROTONIX) 40 MG tablet Take 1 tablet (40 mg total) by mouth daily. 11/30/19   Monica Becton, MD  ?sertraline (ZOLOFT) 50 MG tablet Take 1 tablet (50 mg total) by mouth daily. 08/09/19   Monica Becton, MD  ?umeclidinium-vilanterol (ANORO ELLIPTA) 62.5-25 MCG/INH AEPB Inhale 1 puff into the lungs daily. 09/25/19   Chilton Greathouse, MD  ? ? ?Family History  ?  ?Family History   ?Problem Relation Age of Onset  ? Uterine cancer Sister   ? Heart attack Mother   ? Breast cancer Mother   ? Sleep apnea Brother   ? Heart attack Maternal Grandfather   ? Sleep apnea Brother   ? ?He indicated that his mother is deceased. He indicated that his father is deceased. He indicated that his sister is deceased. He indicated that both of his brothers are alive. He indicated that his maternal grandmother is deceased. He indicated that his maternal grandfather is deceased. He indicated that his paternal grandmother is deceased. He indicated that his paternal grandfather is deceased. ? ?Social History  ?  ?Social History  ? ?Socioeconomic History  ? Marital status: Widowed  ?  Spouse name: Not on file  ? Number of children: Not on file  ?  Years of education: Not on file  ? Highest education level: Not on file  ?Occupational History  ? Occupation: security guard  ?Tobacco Use  ? Smoking status: Former  ?  Types: Cigarettes  ?  Quit date: 01/02/2013  ?  Years since quitting: 9.1  ? Smokeless tobacco: Never  ? Tobacco comments:  ?  smokes about a week  ?Substance and Sexual Activity  ? Alcohol use: Yes  ?  Alcohol/week: 0.0 standard drinks  ? Drug use: No  ? Sexual activity: Not Currently  ?Other Topics Concern  ? Not on file  ?Social History Narrative  ? 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 He reports he is independent with all his care needs and transportation to medical appointments  ? He is stating he is to either retire or go out on disability soon.   ? His daughter, Hassel Neth is a SW per pt  ? ?Social Determinants of Health  ? ?Financial Resource Strain: Not on file  ?Food Insecurity: Not on file  ?Transportation Needs: Not on file  ?Physical Activity: Not on file  ?Stress: Not on file  ?Social Connections: Not on file  ?Intimate Partner Violence: Not on file  ?  ? ?Review of Systems  ?  ?General:  No chills, fever, night sweats or weight changes.   ?Cardiovascular:  No chest pain, dyspnea on exertion, edema, orthopnea, palpitations, paroxysmal nocturnal dyspnea. ?Dermatological: No rash, lesions/masses ?Respiratory: No cough, dyspnea ?Urologic: No hematuria, dysuria

## 2022-03-13 NOTE — Telephone Encounter (Signed)
Pt c/o of Chest Pain: STAT if CP now or developed within 24 hours ? ?1. Are you having CP right now? no ? ?2. Are you experiencing any other symptoms (ex. SOB, nausea, vomiting, sweating)? no ? ?3. How long have you been experiencing CP? About three weeks ? ?4. Is your CP continuous or coming and going? Comes and goes ? ?5. Have you taken Nitroglycerin? no ? ? ?Patient said he gets an intense pain in his shoulders and chest after he gets up at night to go to the bathroom. He has to sit in his chair for a while before it calms down and he can go back to bed.He said it mainly happens at night , but it can happen during the day if he over exerts himself. He has seen his Pulmonologist and his PCP about this too. He is not sure what to do  ?

## 2022-03-13 NOTE — Telephone Encounter (Signed)
Patient reports that when he wakes up to void during the night, he gets pain 9/10 across his chest shoulder to shoulder. He sits in a chair and can wait from 10-30 minutes for the pain to go away. This has been ongoing for 3 weeks. He does not have NTG. He stated he doesn't want to go to the ED because the pain will be gone by the time he gets there. He saw PCP 1 week ago and had a "normal EKG." No chest pain at present. BP 110/60, O2 sat 90% (has COPD). Dr. Debara Pickett advised, who recommended patient be scheduled with APP. Appointment made with Coletta Memos on 4/4. When I called patient back to make the appointment, he told me that when he had chest pain last night, his BP was 220/120 and then went back to "normal." He also stated that he uses CPAP and that could be what is causing his chest pain. He sees pulmonologist. I recommended that when he has another episode of chest pain, between now and his appointment, he needs to call 911. He said, "My pain will be gone by the time they get here." I reminded him that he told me his pain can last from 10-30 minutes, and that EMS/911 should be called just to make sure his pain is or is not heart-related. Patient voiced understanding. ?

## 2022-03-14 ENCOUNTER — Other Ambulatory Visit: Payer: Self-pay

## 2022-03-14 ENCOUNTER — Inpatient Hospital Stay (HOSPITAL_COMMUNITY): Payer: Medicare (Managed Care)

## 2022-03-14 ENCOUNTER — Inpatient Hospital Stay (HOSPITAL_COMMUNITY)
Admission: EM | Admit: 2022-03-14 | Discharge: 2022-04-02 | DRG: 234 | Disposition: A | Discharge status: Home Health | Payer: Medicare (Managed Care) | Attending: Cardiothoracic Surgery | Admitting: Cardiothoracic Surgery

## 2022-03-14 ENCOUNTER — Encounter (HOSPITAL_COMMUNITY): Payer: Self-pay | Admitting: Emergency Medicine

## 2022-03-14 ENCOUNTER — Emergency Department (HOSPITAL_COMMUNITY): Payer: Medicare (Managed Care)

## 2022-03-14 DIAGNOSIS — I3139 Other pericardial effusion (noninflammatory): Secondary | ICD-10-CM | POA: Diagnosis present

## 2022-03-14 DIAGNOSIS — Z951 Presence of aortocoronary bypass graft: Secondary | ICD-10-CM

## 2022-03-14 DIAGNOSIS — J9611 Chronic respiratory failure with hypoxia: Secondary | ICD-10-CM | POA: Diagnosis present

## 2022-03-14 DIAGNOSIS — I110 Hypertensive heart disease with heart failure: Secondary | ICD-10-CM | POA: Diagnosis not present

## 2022-03-14 DIAGNOSIS — J449 Chronic obstructive pulmonary disease, unspecified: Secondary | ICD-10-CM | POA: Diagnosis not present

## 2022-03-14 DIAGNOSIS — D62 Acute posthemorrhagic anemia: Secondary | ICD-10-CM | POA: Diagnosis not present

## 2022-03-14 DIAGNOSIS — Z87891 Personal history of nicotine dependence: Secondary | ICD-10-CM

## 2022-03-14 DIAGNOSIS — I083 Combined rheumatic disorders of mitral, aortic and tricuspid valves: Secondary | ICD-10-CM | POA: Diagnosis not present

## 2022-03-14 DIAGNOSIS — Z9981 Dependence on supplemental oxygen: Secondary | ICD-10-CM | POA: Diagnosis not present

## 2022-03-14 DIAGNOSIS — Z79899 Other long term (current) drug therapy: Secondary | ICD-10-CM

## 2022-03-14 DIAGNOSIS — I249 Acute ischemic heart disease, unspecified: Secondary | ICD-10-CM | POA: Diagnosis present

## 2022-03-14 DIAGNOSIS — R7303 Prediabetes: Secondary | ICD-10-CM | POA: Diagnosis present

## 2022-03-14 DIAGNOSIS — I2510 Atherosclerotic heart disease of native coronary artery without angina pectoris: Secondary | ICD-10-CM | POA: Diagnosis not present

## 2022-03-14 DIAGNOSIS — Z8249 Family history of ischemic heart disease and other diseases of the circulatory system: Secondary | ICD-10-CM

## 2022-03-14 DIAGNOSIS — I214 Non-ST elevation (NSTEMI) myocardial infarction: Principal | ICD-10-CM

## 2022-03-14 DIAGNOSIS — K5900 Constipation, unspecified: Secondary | ICD-10-CM | POA: Diagnosis present

## 2022-03-14 DIAGNOSIS — Z7901 Long term (current) use of anticoagulants: Secondary | ICD-10-CM

## 2022-03-14 DIAGNOSIS — I872 Venous insufficiency (chronic) (peripheral): Secondary | ICD-10-CM | POA: Diagnosis present

## 2022-03-14 DIAGNOSIS — Z6841 Body Mass Index (BMI) 40.0 and over, adult: Secondary | ICD-10-CM | POA: Diagnosis not present

## 2022-03-14 DIAGNOSIS — Z20822 Contact with and (suspected) exposure to covid-19: Secondary | ICD-10-CM | POA: Diagnosis present

## 2022-03-14 DIAGNOSIS — I4819 Other persistent atrial fibrillation: Secondary | ICD-10-CM | POA: Diagnosis not present

## 2022-03-14 DIAGNOSIS — Z7951 Long term (current) use of inhaled steroids: Secondary | ICD-10-CM

## 2022-03-14 DIAGNOSIS — G4733 Obstructive sleep apnea (adult) (pediatric): Secondary | ICD-10-CM | POA: Diagnosis present

## 2022-03-14 DIAGNOSIS — I5042 Chronic combined systolic (congestive) and diastolic (congestive) heart failure: Secondary | ICD-10-CM | POA: Diagnosis not present

## 2022-03-14 DIAGNOSIS — E785 Hyperlipidemia, unspecified: Secondary | ICD-10-CM | POA: Diagnosis present

## 2022-03-14 DIAGNOSIS — I25110 Atherosclerotic heart disease of native coronary artery with unstable angina pectoris: Secondary | ICD-10-CM | POA: Diagnosis not present

## 2022-03-14 DIAGNOSIS — Z22322 Carrier or suspected carrier of Methicillin resistant Staphylococcus aureus: Secondary | ICD-10-CM

## 2022-03-14 DIAGNOSIS — Z01810 Encounter for preprocedural cardiovascular examination: Secondary | ICD-10-CM | POA: Diagnosis not present

## 2022-03-14 DIAGNOSIS — I252 Old myocardial infarction: Secondary | ICD-10-CM | POA: Diagnosis not present

## 2022-03-14 DIAGNOSIS — I5022 Chronic systolic (congestive) heart failure: Secondary | ICD-10-CM | POA: Diagnosis not present

## 2022-03-14 DIAGNOSIS — R110 Nausea: Secondary | ICD-10-CM | POA: Diagnosis present

## 2022-03-14 DIAGNOSIS — I5032 Chronic diastolic (congestive) heart failure: Secondary | ICD-10-CM | POA: Diagnosis not present

## 2022-03-14 LAB — ECHOCARDIOGRAM COMPLETE
Area-P 1/2: 5.84 cm2
Height: 73 in
S' Lateral: 3.9 cm
Weight: 4960 oz

## 2022-03-14 LAB — CBC
HCT: 49.2 % (ref 39.0–52.0)
Hemoglobin: 15 g/dL (ref 13.0–17.0)
MCH: 27.4 pg (ref 26.0–34.0)
MCHC: 30.5 g/dL (ref 30.0–36.0)
MCV: 89.8 fL (ref 80.0–100.0)
Platelets: 330 10*3/uL (ref 150–400)
RBC: 5.48 MIL/uL (ref 4.22–5.81)
RDW: 14.7 % (ref 11.5–15.5)
WBC: 14.4 10*3/uL — ABNORMAL HIGH (ref 4.0–10.5)
nRBC: 0 % (ref 0.0–0.2)

## 2022-03-14 LAB — RESP PANEL BY RT-PCR (FLU A&B, COVID) ARPGX2
Influenza A by PCR: NEGATIVE
Influenza B by PCR: NEGATIVE
SARS Coronavirus 2 by RT PCR: NEGATIVE

## 2022-03-14 LAB — BASIC METABOLIC PANEL
Anion gap: 10 (ref 5–15)
BUN: 18 mg/dL (ref 8–23)
CO2: 27 mmol/L (ref 22–32)
Calcium: 9.8 mg/dL (ref 8.9–10.3)
Chloride: 99 mmol/L (ref 98–111)
Creatinine, Ser: 0.77 mg/dL (ref 0.61–1.24)
GFR, Estimated: >60 mL/min (ref >60)
Glucose, Bld: 112 mg/dL — ABNORMAL HIGH (ref 70–99)
Potassium: 4.1 mmol/L (ref 3.5–5.1)
Sodium: 136 mmol/L (ref 135–145)

## 2022-03-14 LAB — HEPARIN LEVEL (UNFRACTIONATED): Heparin Unfractionated: 0.76 IU/mL — ABNORMAL HIGH (ref 0.30–0.70)

## 2022-03-14 LAB — APTT: aPTT: 40 seconds — ABNORMAL HIGH (ref 24–36)

## 2022-03-14 LAB — TROPONIN I (HIGH SENSITIVITY)
Troponin I (High Sensitivity): 164 ng/L (ref <18)
Troponin I (High Sensitivity): 152 ng/L (ref <18)

## 2022-03-14 LAB — HIV ANTIBODY (ROUTINE TESTING W REFLEX): HIV Screen 4th Generation wRfx: NONREACTIVE

## 2022-03-14 MED ORDER — DIPHENHYDRAMINE HCL 25 MG PO CAPS
25.0000 mg | ORAL_CAPSULE | Freq: Every evening | ORAL | Status: DC | PRN
  Administered 2022-03-14 – 2022-03-16 (×2): 25 mg via ORAL
  Filled 2022-03-14 (×2): qty 1

## 2022-03-14 MED ORDER — HEPARIN BOLUS VIA INFUSION
4000.0000 [IU] | Freq: Once | INTRAVENOUS | Status: DC
  Filled 2022-03-14: qty 4000

## 2022-03-14 MED ORDER — FLUTICASONE FUROATE-VILANTEROL 100-25 MCG/ACT IN AEPB
1.0000 | INHALATION_SPRAY | Freq: Every day | RESPIRATORY_TRACT | Status: DC
  Administered 2022-03-14 – 2022-03-23 (×10): 1 via RESPIRATORY_TRACT
  Filled 2022-03-14: qty 28

## 2022-03-14 MED ORDER — SPIRONOLACTONE 25 MG PO TABS
25.0000 mg | ORAL_TABLET | Freq: Every day | ORAL | Status: DC
  Administered 2022-03-14 – 2022-03-23 (×10): 25 mg via ORAL
  Filled 2022-03-14 (×11): qty 1

## 2022-03-14 MED ORDER — MAGNESIUM OXIDE -MG SUPPLEMENT 400 (240 MG) MG PO TABS
400.0000 mg | ORAL_TABLET | Freq: Every day | ORAL | Status: DC
  Administered 2022-03-14 – 2022-03-23 (×10): 400 mg via ORAL
  Filled 2022-03-14 (×10): qty 1

## 2022-03-14 MED ORDER — HEPARIN (PORCINE) 25000 UT/250ML-% IV SOLN: 1450.0000 [IU]/h | INTRAVENOUS | Status: DC

## 2022-03-14 MED ORDER — UMECLIDINIUM BROMIDE 62.5 MCG/ACT IN AEPB
1.0000 | INHALATION_SPRAY | Freq: Every day | RESPIRATORY_TRACT | Status: DC
  Administered 2022-03-14 – 2022-03-21 (×8): 1 via RESPIRATORY_TRACT
  Filled 2022-03-14 (×2): qty 7

## 2022-03-14 MED ORDER — PANTOPRAZOLE SODIUM 40 MG PO TBEC
40.0000 mg | DELAYED_RELEASE_TABLET | Freq: Every day | ORAL | Status: DC
  Administered 2022-03-14 – 2022-03-23 (×10): 40 mg via ORAL
  Filled 2022-03-14 (×10): qty 1

## 2022-03-14 MED ORDER — NITROGLYCERIN 0.4 MG SL SUBL
0.4000 mg | SUBLINGUAL_TABLET | Freq: Once | SUBLINGUAL | Status: AC
  Administered 2022-03-14: 0.4 mg via SUBLINGUAL
  Filled 2022-03-14: qty 1

## 2022-03-14 MED ORDER — CALCIUM CARBONATE ANTACID 500 MG PO CHEW: 3.0000 | CHEWABLE_TABLET | Freq: Two times a day (BID) | ORAL | Status: DC | PRN

## 2022-03-14 MED ORDER — PERFLUTREN LIPID MICROSPHERE
1.0000 mL | INTRAVENOUS | Status: AC | PRN
  Filled 2022-03-14: qty 10

## 2022-03-14 MED ORDER — LORATADINE 10 MG PO TABS
10.0000 mg | ORAL_TABLET | Freq: Every day | ORAL | Status: DC
  Administered 2022-03-14 – 2022-03-23 (×10): 10 mg via ORAL
  Filled 2022-03-14 (×11): qty 1

## 2022-03-14 MED ORDER — ACETAMINOPHEN 325 MG PO TABS
650.0000 mg | ORAL_TABLET | ORAL | Status: DC | PRN
  Administered 2022-03-14 – 2022-03-16 (×7): 650 mg via ORAL
  Filled 2022-03-14 (×7): qty 2

## 2022-03-14 MED ORDER — CARVEDILOL 25 MG PO TABS
25.0000 mg | ORAL_TABLET | Freq: Two times a day (BID) | ORAL | Status: DC
  Administered 2022-03-14 – 2022-03-18 (×8): 25 mg via ORAL
  Filled 2022-03-14: qty 2
  Filled 2022-03-14 (×8): qty 1

## 2022-03-14 MED ORDER — METOPROLOL SUCCINATE ER 25 MG PO TB24: 25.0000 mg | ORAL_TABLET | Freq: Every day | ORAL | Status: DC

## 2022-03-14 MED ORDER — DIPHENHYDRAMINE-APAP (SLEEP) 25-500 MG PO TABS: 1.0000 | ORAL_TABLET | Freq: Every day | ORAL | Status: DC

## 2022-03-14 MED ORDER — MOMETASONE FURO-FORMOTEROL FUM 200-5 MCG/ACT IN AERO
2.0000 | INHALATION_SPRAY | Freq: Two times a day (BID) | RESPIRATORY_TRACT | Status: DC
Start: 2022-03-14 — End: 2022-03-14

## 2022-03-14 MED ORDER — ONDANSETRON HCL 4 MG/2ML IJ SOLN
4.0000 mg | Freq: Four times a day (QID) | INTRAMUSCULAR | Status: DC | PRN
  Administered 2022-03-15 – 2022-03-18 (×6): 4 mg via INTRAVENOUS
  Filled 2022-03-14 (×6): qty 2

## 2022-03-14 MED ORDER — MONTELUKAST SODIUM 10 MG PO TABS
10.0000 mg | ORAL_TABLET | Freq: Every day | ORAL | Status: DC
  Administered 2022-03-14 – 2022-03-23 (×10): 10 mg via ORAL
  Filled 2022-03-14 (×11): qty 1

## 2022-03-14 MED ORDER — ASPIRIN EC 81 MG PO TBEC
81.0000 mg | DELAYED_RELEASE_TABLET | Freq: Every day | ORAL | Status: DC
  Administered 2022-03-15 – 2022-03-23 (×9): 81 mg via ORAL
  Filled 2022-03-14 (×10): qty 1

## 2022-03-14 MED ORDER — LISINOPRIL 10 MG PO TABS
10.0000 mg | ORAL_TABLET | Freq: Every day | ORAL | Status: DC
  Administered 2022-03-14 – 2022-03-23 (×10): 10 mg via ORAL
  Filled 2022-03-14 (×10): qty 1

## 2022-03-14 MED ORDER — DOFETILIDE 500 MCG PO CAPS
500.0000 ug | ORAL_CAPSULE | Freq: Two times a day (BID) | ORAL | Status: DC
  Administered 2022-03-14 – 2022-03-23 (×20): 500 ug via ORAL
  Filled 2022-03-14: qty 2
  Filled 2022-03-14: qty 1
  Filled 2022-03-14: qty 2
  Filled 2022-03-14 (×3): qty 1
  Filled 2022-03-14: qty 2
  Filled 2022-03-14 (×2): qty 1
  Filled 2022-03-14: qty 2
  Filled 2022-03-14: qty 1
  Filled 2022-03-14: qty 2
  Filled 2022-03-14 (×2): qty 1
  Filled 2022-03-14 (×2): qty 2
  Filled 2022-03-14: qty 1
  Filled 2022-03-14: qty 2
  Filled 2022-03-14 (×4): qty 1

## 2022-03-14 MED ORDER — ATORVASTATIN CALCIUM 80 MG PO TABS
80.0000 mg | ORAL_TABLET | Freq: Every day | ORAL | Status: DC
Start: 2022-03-14 — End: 2022-04-02
  Administered 2022-03-14 – 2022-04-01 (×19): 80 mg via ORAL
  Filled 2022-03-14 (×19): qty 1

## 2022-03-14 MED ORDER — PERFLUTREN LIPID MICROSPHERE
1.0000 mL | INTRAVENOUS | Status: AC | PRN
  Administered 2022-03-14: 2 mL via INTRAVENOUS
  Filled 2022-03-14: qty 10

## 2022-03-14 MED ORDER — OXYMETAZOLINE HCL 0.05 % NA SOLN: 1.0000 | Freq: Every day | NASAL | Status: AC | PRN

## 2022-03-14 MED ORDER — ATORVASTATIN CALCIUM 40 MG PO TABS: 40.0000 mg | ORAL_TABLET | Freq: Every day | ORAL | Status: DC

## 2022-03-14 MED ORDER — HEPARIN BOLUS VIA INFUSION
3000.0000 [IU] | Freq: Once | INTRAVENOUS | Status: AC
  Administered 2022-03-14: 3000 [IU] via INTRAVENOUS
  Filled 2022-03-14: qty 3000

## 2022-03-14 MED ORDER — NITROGLYCERIN 0.4 MG SL SUBL
0.4000 mg | SUBLINGUAL_TABLET | SUBLINGUAL | Status: DC | PRN
  Administered 2022-03-16 – 2022-03-23 (×8): 0.4 mg via SUBLINGUAL
  Filled 2022-03-14 (×11): qty 1

## 2022-03-14 MED ORDER — HEPARIN (PORCINE) 25000 UT/250ML-% IV SOLN
2250.0000 [IU]/h | INTRAVENOUS | Status: DC
  Administered 2022-03-14: 1450 [IU]/h via INTRAVENOUS
  Administered 2022-03-14: 1800 [IU]/h via INTRAVENOUS
  Administered 2022-03-15 – 2022-03-16 (×2): 2250 [IU]/h via INTRAVENOUS
  Filled 2022-03-14 (×4): qty 250

## 2022-03-14 MED ORDER — SERTRALINE HCL 50 MG PO TABS
50.0000 mg | ORAL_TABLET | Freq: Every day | ORAL | Status: DC
Start: 2022-03-14 — End: 2022-03-22
  Administered 2022-03-15 – 2022-03-19 (×5): 50 mg via ORAL
  Filled 2022-03-14 (×10): qty 1

## 2022-03-14 MED ORDER — OXYCODONE HCL 5 MG PO TABS
5.0000 mg | ORAL_TABLET | Freq: Once | ORAL | Status: AC
  Administered 2022-03-14: 5 mg via ORAL
  Filled 2022-03-14: qty 1

## 2022-03-14 NOTE — Progress Notes (Signed)
Patient admitted to floor. Oriented to room and floor.  ?

## 2022-03-14 NOTE — Progress Notes (Addendum)
Phlebotomy at bedside to draw 1400 heparin unfractionated level.  ?

## 2022-03-14 NOTE — Progress Notes (Signed)
ANTICOAGULATION CONSULT NOTE - Initial Consult ? ?Pharmacy Consult for Heparin ?Indication: chest pain/ACS ? ?No Known Allergies ? ?Patient Measurements: ?Height: 6\' 1"  (185.4 cm) ?Weight: (!) 140.6 kg (310 lb) ?IBW/kg (Calculated) : 79.9 ?Heparin Dosing Weight: 112 kg ? ?Vital Signs: ?Temp: 98.5 ?F (36.9 ?C) (04/01 0334) ?Temp Source: Oral (04/01 0334) ?BP: 117/66 (04/01 0630) ?Pulse Rate: 77 (04/01 0630) ? ?Labs: ?Recent Labs  ?  03/14/22 ?0332 03/14/22 ?05/14/22  ?HGB 15.0  --   ?HCT 49.2  --   ?PLT 330  --   ?CREATININE 0.77  --   ?TROPONINIHS 164* 152*  ? ? ?Estimated Creatinine Clearance: 137.5 mL/min (by C-G formula based on SCr of 0.77 mg/dL). ? ? ?Medical History: ?Past Medical History:  ?Diagnosis Date  ? Chronic systolic CHF (congestive heart failure) (HCC)   ? a. 2D ECHO (03/06/15) EF 20-25%, diffuse HK. Asc aortic diameter: 83mm. Mild LA dilation, mild RV dilation. Mild RV systolic dysfunction   b. LifeVest placed on 02/2015 admissoin   ? Coronary artery disease   ? a. 70% LAD lesion  ? Elevated TSH   ? ETOH abuse   ? Hypertension   ? Morbid obesity (HCC)   ? a. BMI ~46  ? Persistent atrial fibrillation (HCC)   ? a. newly dx 02/2015 admission. s/p successful TEE/DCCV  b. on Eliquis  ? Pre-diabetes   ? a. HgA1c 6.1  ? ? ?Assessment: ? 66 years old white male with PMH of CHF, Obesity, atrial fibrillation on Eliquis PTA has intermittent chest pain with radiation to both shoulders. Last Eliquis dose pm on 3/31. ? ?Will start heparin drip for ACS, but will need to monitor aPTT and Heparin levels due to the interference of Eliquis with the heparin levels.  Will follow both until they correlate. ? ?Goal of Therapy:  ?Heparin level 0.3-0.7 units/ml ?aPTT 66-102 seconds ?Monitor platelets by anticoagulation protocol: Yes ?  ?Plan:  ?Give 4000 units bolus x 1 ?Start heparin infusion at 1450 units/hr ?Check anti-Xa level and aPTT in 6 hours and daily while on heparin ?Continue to monitor H&H and platelets ? ?4/31, PharmD, FCCM ?Clinical Pharmacist ?Please see AMION for all Pharmacists' Contact Phone Numbers ?03/14/2022, 7:08 AM  ? ? ? ?

## 2022-03-14 NOTE — ED Triage Notes (Signed)
Pt BIB EMS from home with intermittent chest pain x 3 weeks. Pt received 1 SL nitro and 324 ASA that stopped his pain.  ?

## 2022-03-14 NOTE — ED Notes (Signed)
Pt transferred off unit for procedure.

## 2022-03-14 NOTE — Progress Notes (Addendum)
ANTICOAGULATION CONSULT NOTE - Initial Consult ? ?Pharmacy Consult for heparin ?Indication: chest pain/ACS ? ?No Known Allergies ? ?Patient Measurements: ?Height: 6\' 1"  (185.4 cm) ?Weight: (Abnormal) 140.6 kg (310 lb) ?IBW/kg (Calculated) : 79.9 ?Heparin Dosing Weight: 112 kg ? ?Vital Signs: ?Temp: 98.5 ?F (36.9 ?C) (04/01 0334) ?Temp Source: Oral (04/01 0334) ?BP: 117/66 (04/01 0630) ?Pulse Rate: 77 (04/01 0630) ? ?Labs: ?Recent Labs  ?  03/14/22 ?0332 03/14/22 ?[id number]  ?HGB 15.0  --   ?HCT 49.2  --   ?PLT 330  --   ?CREATININE 0.77  --   ?TROPONINIHS 164* 152*  ? ? ?Estimated Creatinine Clearance: 137.5 mL/min (by C-G formula based on SCr of 0.77 mg/dL). ? ? ?Medical History: ?Past Medical History:  ?Diagnosis Date  ? Chronic systolic CHF (congestive heart failure) ([place])   ? a. 2D ECHO (03/06/15) EF 20-25%, diffuse HK. Asc aortic diameter: 57mm. Mild LA dilation, mild RV dilation. Mild RV systolic dysfunction   b. LifeVest placed on 02/2015 admissoin   ? Coronary artery disease   ? a. 70% LAD lesion  ? Elevated TSH   ? ETOH abuse   ? Hypertension   ? Morbid obesity ([place])   ? a. BMI ~46  ? Persistent atrial fibrillation ([place])   ? a. newly dx 02/2015 admission. s/p successful TEE/DCCV  b. on Eliquis  ? Pre-diabetes   ? a. HgA1c 6.1  ? ?Assessment:: 66 yo male with PMH of CHF, Obesity, atrial fibrillation has intermittent chest pain. Patient on Eliquis PTA with last dose 3/31 @ 2100. Heparin level will be affected, so aptt will be used until levels correlate. Will order stat aptt and heparin level for base line. Will avoid bolus and start hepain infusion at 0900. CBC WNL and stable ? ?Goal of Therapy:  ?Heparin level 0.3-0.7 units/ml ?Monitor platelets by anticoagulation protocol: Yes ?  ?Plan:  ?Start heparin infusion at 1450 units/hr ?Check aptt level in 6 hours and daily while on heparin ?Continue to monitor H&H and platelets ? ?Georga Bora, PharmD ?Clinical Pharmacist ?03/14/2022 7:07 AM ?Please check AMION for all [place] numbers ? ? ? ?

## 2022-03-14 NOTE — ED Notes (Signed)
MD Bero aware of pt trop ?

## 2022-03-14 NOTE — ED Notes (Signed)
Pt reporting severe CP with SOB - MD notified - meds ordered and repeat EKG to be obtained  ?

## 2022-03-14 NOTE — H&P (Signed)
Referring Physician: Viona Gilmore, MD ? ?Arthur Church is an 66 y.o. male.                       ?Chief Complaint: Chest pain ? ?HPI: 66 years old white male with PMH of CHF, Obesity, atrial fibrillation has intermittent chest pain with radiation to both shoulder. Chest pain improved with SL NTG. His Troponin I levels are mildly elevated. 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 CXR shows emphysematous lung. ? ?Past Medical History:  ?Diagnosis Date  ? Chronic systolic CHF (congestive heart failure) ([place])   ? a. 2D ECHO (03/06/15) EF 20-25%, diffuse HK. Asc aortic diameter: 82mm. Mild LA dilation, mild RV dilation. Mild RV systolic dysfunction   b. LifeVest placed on 02/2015 admissoin   ? Coronary artery disease   ? a. 70% LAD lesion  ? Elevated TSH   ? ETOH abuse   ? Hypertension   ? Morbid obesity ([place])   ? a. BMI ~46  ? Persistent atrial fibrillation ([place])   ? a. newly dx 02/2015 admission. s/p successful TEE/DCCV  b. on Eliquis  ? Pre-diabetes   ? a. HgA1c 6.1  ?  ? ? ?Past Surgical History:  ?Procedure Laterality Date  ? CARDIAC CATHETERIZATION N/A 04/26/2015  ? Procedure: Left Heart Cath and Coronary Angiography;  Surgeon: Belva Crome, MD;  Location: [place] CV LAB;  Service: Cardiovascular;  Laterality: N/A;  ? CARDIOVERSION N/A 03/06/2015  ? Procedure: CARDIOVERSION;  Surgeon: Larey Dresser, MD;  Location: [place];  Service: Cardiovascular;  Laterality: N/A;  ? HERNIA REPAIR    ? TEE WITHOUT CARDIOVERSION N/A 03/06/2015  ? Procedure: TRANSESOPHAGEAL ECHOCARDIOGRAM (TEE);  Surgeon: Larey Dresser, MD;  Location: [place];  Service: Cardiovascular;  Laterality: N/A;  ? TEE WITHOUT CARDIOVERSION N/A 05/02/2015  ? Procedure: TRANSESOPHAGEAL ECHOCARDIOGRAM (TEE);  Surgeon: Lelon Perla, MD;  Location: [place];  Service: Cardiovascular;  Laterality: N/A;  ? ? ?Family History  ?Problem Relation Age of Onset  ? Uterine cancer Sister   ? Heart attack Mother   ? Breast cancer Mother   ? Sleep apnea  Brother   ? Heart attack Maternal Grandfather   ? Sleep apnea Brother   ? ?Social History:  reports that he quit smoking about 9 years ago. His smoking use included cigarettes. He has never used smokeless tobacco. He reports current alcohol use. He reports that he does not use drugs. ? ?Allergies: No Known Allergies ? ?(Not in a hospital admission) ? ? ?Results for orders placed or performed during the hospital encounter of 03/14/22 (from the past 48 hour(s))  ?Basic metabolic panel     Status: Abnormal  ? Collection Time: 03/14/22  3:32 AM  ?Result Value Ref Range  ? Sodium 136 135 - 145 mmol/L  ? Potassium 4.1 3.5 - 5.1 mmol/L  ? Chloride 99 98 - 111 mmol/L  ? CO2 27 22 - 32 mmol/L  ? Glucose, Bld 112 (H) 70 - 99 mg/dL  ?  Comment: Glucose reference range applies only to samples taken after fasting for at least 8 hours.  ? BUN 18 8 - 23 mg/dL  ? Creatinine, Ser 0.77 0.61 - 1.24 mg/dL  ? Calcium 9.8 8.9 - 10.3 mg/dL  ? GFR, Estimated >60 >60 mL/min  ?  Comment: (NOTE) ?Calculated using the CKD-EPI Creatinine Equation (2021) ?  ? Anion gap 10 5 - 15  ?  Comment: Performed at [facility], [place] [address], [place], [place] [address]  ?CBC     Status: Abnormal  ? Collection Time: 03/14/22  3:32 AM  ?Result Value Ref Range  ? WBC 14.4 (H) 4.0 - 10.5 K/uL  ? RBC 5.48 4.22 - 5.81 MIL/uL  ? Hemoglobin 15.0 13.0 - 17.0 g/dL  ? HCT 49.2 39.0 - 52.0 %  ? MCV 89.8 80.0 - 100.0 fL  ? MCH 27.4 26.0 - 34.0 pg  ? MCHC 30.5 30.0 - 36.0 g/dL  ? RDW 14.7 11.5 - 15.5 %  ? Platelets 330 150 - 400 K/uL  ? nRBC 0.0 0.0 - 0.2 %  ?  Comment: Performed at [facility], [place] [address], [place], [place] [address]  ?Troponin I (High Sensitivity)     Status: Abnormal  ? Collection Time: 03/14/22  3:32 AM  ?Result Value Ref Range  ? Troponin I (High Sensitivity) 164 (HH) <18 ng/L  ?  Comment: CRITICAL RESULT CALLED TO, READ BACK BY AND VERIFIED WITH: ? E. SULIVAN. RN, [id number], 03/14/22, E. ADEDOKUN ?(NOTE) ?Elevated high sensitivity  troponin I (hsTnI) values and significant  ?changes across serial measurements may suggest ACS but many other  ?chronic and acute conditions are known to elevate hsTnI results.  ?Refer to the Links section for chest pain algorithms and additional  ?guidance. ?Performed at [facility], [place] [address] [place], [place] ?[address] ?  ?Troponin I (High Sensitivity)     Status: Abnormal  ? Collection Time: 03/14/22  5:20 AM  ?Result Value Ref Range  ? Troponin I (High Sensitivity) 152 (HH) <18 ng/L  ?  Comment: CRITICAL VALUE NOTED.  VALUE IS CONSISTENT WITH PREVIOUSLY REPORTED AND CALLED VALUE. ?(NOTE) ?Elevated high sensitivity troponin I (hsTnI) values and significant  ?changes across serial measurements may suggest ACS but many other  ?chronic and acute conditions are known to elevate hsTnI results.  ?Refer to the Links section for chest pain algorithms and additional  ?guidance. ?Performed at [facility], [place] [address], [place], [place] ?[address] ?  ? ?DG Chest 2 View ? ?Result Date: 03/14/2022 ?CLINICAL DATA:  Chest pain EXAM: CHEST - 2 VIEW COMPARISON:  08/21/2020 FINDINGS: Heart size and pulmonary vascularity are normal. Emphysematous changes in the lungs. Peribronchial thickening with interstitial infiltrates consistent with chronic bronchitis. Linear scarring in the lung bases. Chronic blunting of the left costophrenic angle, unchanged, likely pleural thickening. No pneumothorax. Mediastinal contours appear intact. IMPRESSION: Emphysematous and chronic bronchitic changes in the lungs. Scarring in the lung bases. No focal consolidation. Electronically Signed   By: Lucienne Capers M.D.   On: 03/14/2022 03:57   ? ?Review Of Systems ?Constitutional: No fever, chills, chronic weight gain. ?Eyes: No vision change, wears glasses. No discharge or pain. ?Ears: No hearing loss, No tinnitus. ?Respiratory: No asthma, positive COPD, pneumonias, shortness of breath. No hemoptysis. ?Cardiovascular: Positive  chest pain, palpitation, leg edema. ?Gastrointestinal: No nausea, vomiting, diarrhea, constipation. No GI bleed. No hepatitis. ?Genitourinary: No dysuria, hematuria, kidney stone. No incontinance. ?Neurological: No headache, stroke, seizures.  ?Psychiatry: No psych facility admission for anxiety, depression, suicide. No detox. ?Skin: No rash. ?Musculoskeletal: Positive joint pain, no fibromyalgia. No neck pain, back pain. ?Lymphadenopathy: No lymphadenopathy. ?Hematology: No anemia or easy bruising. ? ? ?Blood pressure 117/66, pulse 77, temperature 98.5 ?F (36.9 ?C), temperature source Oral, resp. rate 14, height 6\' 1"  (1.854 m), weight (!) 140.6 kg, SpO2 95 %. Body mass index is 40.9 kg/m?. ?General  appearance: alert, cooperative, appears stated age and no distress ?Head: Normocephalic, atraumatic. ?Eyes: Blue eyes, pink conjunctiva, corneas clear.  ?Neck: No adenopathy, no carotid bruit, no JVD, supple, symmetrical, trachea midline and thyroid not enlarged. ?Resp: Clear to auscultation bilaterally. ?Cardio: Regular rate and rhythm, S1, S2 normal, II/VI systolic murmur, no click, rub or gallop ?GI: Soft, distended, non-tender; bowel sounds normal; no organomegaly. ?Extremities: No edema, cyanosis or clubbing. ?Skin: Warm and dry.  ?Neurologic: Alert and oriented X 3, normal strength. Normal coordination. ? ?Assessment/Plan ?Acute coronary syndrome ?Chronic diastolic left heart failure ?Paroxysmal atrial fibrillation ?Morbid Obesity ?CAD ?HTN ? ?Plan: ?Admit. ?IV heparin. ?Echocardiogram ?Cardiac catheterization. ? ?Time spent: Review of old records, Lab, x-rays, EKG, other cardiac tests, examination, discussion with patientdoctor over 70 minutes. ? ?Birdie Riddle, MD ? ?03/14/2022, 6:42 AM ? ? ? ?

## 2022-03-14 NOTE — Progress Notes (Signed)
ANTICOAGULATION CONSULT NOTE - Initial Consult ? ?Pharmacy Consult for Heparin ?Indication: chest pain/ACS ? ?No Known Allergies ? ?Patient Measurements: ?Height: 6\' 1"  (185.4 cm) ?Weight: (!) 140.9 kg (310 lb 10.1 oz) ?IBW/kg (Calculated) : 79.9 ?Heparin Dosing Weight: 112 kg ? ?Vital Signs: ?Temp: 98.5 ?F (36.9 ?C) (04/01 1511) ?BP: 107/71 (04/01 1522) ?Pulse Rate: 81 (04/01 1511) ? ?Labs: ?Recent Labs  ?  03/14/22 ?0332 03/14/22 ?05/14/22 03/14/22 ?1618  ?HGB 15.0  --   --   ?HCT 49.2  --   --   ?PLT 330  --   --   ?APTT  --   --  40*  ?HEPARINUNFRC  --   --  0.76*  ?CREATININE 0.77  --   --   ?TROPONINIHS 164* 152*  --   ? ? ? ?Estimated Creatinine Clearance: 137.6 mL/min (by C-G formula based on SCr of 0.77 mg/dL). ? ? ?Medical History: ?Past Medical History:  ?Diagnosis Date  ? Chronic systolic CHF (congestive heart failure) (HCC)   ? a. 2D ECHO (03/06/15) EF 20-25%, diffuse HK. Asc aortic diameter: 59mm. Mild LA dilation, mild RV dilation. Mild RV systolic dysfunction   b. LifeVest placed on 02/2015 admissoin   ? Coronary artery disease   ? a. 70% LAD lesion  ? Elevated TSH   ? ETOH abuse   ? Hypertension   ? Morbid obesity (HCC)   ? a. BMI ~46  ? Persistent atrial fibrillation (HCC)   ? a. newly dx 02/2015 admission. s/p successful TEE/DCCV  b. on Eliquis  ? Pre-diabetes   ? a. HgA1c 6.1  ? ? ?Assessment: ? 66 years old white male with PMH of CHF, Obesity, atrial fibrillation on Eliquis PTA has intermittent chest pain with radiation to both shoulders. Last Eliquis dose pm on 3/31. ? ?Will start heparin drip for ACS, but will need to monitor aPTT and Heparin levels due to the interference of Eliquis with the heparin levels.  Will follow both until they correlate. ? ?PTT came back low tonight at 40. No correlating with heparin level yet. We will increase rate and check level in AM. ? ?Goal of Therapy:  ?Heparin level 0.3-0.7 units/ml ?aPTT 66-102 seconds ?Monitor platelets by anticoagulation protocol: Yes ?  ?Plan:   ?Give 3000 units bolus x 1 ?Increase heparin infusion to 1800 units/hr ?Check anti-Xa level and aPTT daily while on heparin ?Continue to monitor H&H and platelets ? ?4/31, PharmD, BCIDP, AAHIVP, CPP ?Infectious Disease Pharmacist ?03/14/2022 5:36 PM ? ? ? ? ?

## 2022-03-14 NOTE — ED Provider Notes (Signed)
?[facility] DEPT ?[facility] Emergency Department ?Provider Note ?MRN:  [id number]  ?Arrival date & time: 03/14/22    ? ?Chief Complaint   ?Chest Pain ?  ?History of Present Illness   ?Arthur Church is a 66 y.o. year-old male with a history of CHF, obesity, A-fib presenting to the ED with chief complaint of chest pain. ? ?Intermittent chest pain over the past few weeks, with this episode started at 3 PM, was constant, severe, diffuse chest pain with radiation to bilateral shoulders.  Improved after nitroglycerin. ? ?Review of Systems  ?A thorough review of systems was obtained and all systems are negative except as noted in the HPI and PMH.  ? ?Patient's Health History   ? ?Past Medical History:  ?Diagnosis Date  ? Chronic systolic CHF (congestive heart failure) ([facility])   ? a. 2D ECHO (03/06/15) EF 20-25%, diffuse HK. Asc aortic diameter: 74mm. Mild LA dilation, mild RV dilation. Mild RV systolic dysfunction   b. LifeVest placed on 02/2015 admissoin   ? Coronary artery disease   ? a. 70% LAD lesion  ? Elevated TSH   ? ETOH abuse   ? Hypertension   ? Morbid obesity ([place])   ? a. BMI ~46  ? Persistent atrial fibrillation ([facility])   ? a. newly dx 02/2015 admission. s/p successful TEE/DCCV  b. on Eliquis  ? Pre-diabetes   ? a. HgA1c 6.1  ?  ?Past Surgical History:  ?Procedure Laterality Date  ? CARDIAC CATHETERIZATION N/A 04/26/2015  ? Procedure: Left Heart Cath and Coronary Angiography;  Surgeon: Belva Crome, MD;  Location: [place] [facility];  Service: Cardiovascular;  Laterality: N/A;  ? CARDIOVERSION N/A 03/06/2015  ? Procedure: CARDIOVERSION;  Surgeon: Larey Dresser, MD;  Location: [place];  Service: Cardiovascular;  Laterality: N/A;  ? HERNIA REPAIR    ? TEE WITHOUT CARDIOVERSION N/A 03/06/2015  ? Procedure: TRANSESOPHAGEAL ECHOCARDIOGRAM (TEE);  Surgeon: Larey Dresser, MD;  Location: [place];  Service: Cardiovascular;  Laterality: N/A;  ? TEE WITHOUT CARDIOVERSION N/A 05/02/2015  ? Procedure:  TRANSESOPHAGEAL ECHOCARDIOGRAM (TEE);  Surgeon: Lelon Perla, MD;  Location: [place];  Service: Cardiovascular;  Laterality: N/A;  ?  ?Family History  ?Problem Relation Age of Onset  ? Uterine cancer Sister   ? Heart attack Mother   ? Breast cancer Mother   ? Sleep apnea Brother   ? Heart attack Maternal Grandfather   ? Sleep apnea Brother   ?  ?Social History  ? ?Socioeconomic History  ? Marital status: Widowed  ?  Spouse name: Not on file  ? Number of children: Not on file  ? Years of education: Not on file  ? Highest education level: Not on file  ?Occupational History  ? Occupation: security guard  ?Tobacco Use  ? Smoking status: Former  ?  Types: Cigarettes  ?  Quit date: 01/02/2013  ?  Years since quitting: 9.2  ? Smokeless tobacco: Never  ? Tobacco comments:  ?  smokes about a week  ?Substance and Sexual Activity  ? Alcohol use: Yes  ?  Alcohol/week: 0.0 standard drinks  ? Drug use: No  ? Sexual activity: Not Currently  ?Other Topics Concern  ? Not on file  ?Social History Narrative  ? Mr Arthur Church 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 He reports he is independent with all his care needs and transportation to medical appointments  ? He is stating  he is to either retire or go out on disability soon.   ? His daughter, Arthur Church is a SW per pt  ? ?Social Determinants of Health  ? ?Financial Resource Strain: Not on file  ?Food Insecurity: Not on file  ?Transportation Needs: Not on file  ?Physical Activity: Not on file  ?Stress: Not on file  ?Social Connections: Not on file  ?Intimate Partner Violence: Not on file  ?  ? ?Physical Exam  ? ?Vitals:  ? 03/14/22 0334 03/14/22 0450  ?BP: 120/65 138/80  ?Pulse: 84 85  ?Resp: 20 13  ?Temp: 98.5 ?F (36.9 ?C)   ?SpO2: 90% 92%  ?  ?CONSTITUTIONAL: Well-appearing, NAD ?NEURO/PSYCH:  Alert and oriented x 3, no focal deficits ?EYES:  eyes equal and reactive ?ENT/NECK:  no LAD, no JVD ?CARDIO: Regular rate, well-perfused, normal S1 and  S2 ?PULM:  CTAB no wheezing or rhonchi ?GI/GU:  non-distended, non-tender ?MSK/SPINE:  No gross deformities, no edema ?SKIN:  no rash, atraumatic ? ? ?*Additional and/or pertinent findings included in MDM below ? ?Diagnostic and Interventional Summary  ? ? EKG Interpretation ? ?Date/Time:  Saturday March 14 2022 [id number] EDT ?Ventricular Rate:  90 ?PR Interval:  118 ?QRS Duration: 92 ?QT Interval:  398 ?QTC Calculation: 486 ?R Axis:   19 ?Text Interpretation: Sinus rhythm with Premature supraventricular complexes Low voltage QRS Septal infarct , age undetermined Abnormal ECG When compared with ECG of 06-May-2015 10:51, PREVIOUS ECG IS PRESENT Confirmed by Gerlene Fee [phone]) on 03/14/2022 4:38:16 AM ?  ? ?  ? ?Labs Reviewed  ?BASIC METABOLIC PANEL - Abnormal; Notable for the following components:  ?    Result Value  ? Glucose, Bld 112 (*)   ? All other components within normal limits  ?CBC - Abnormal; Notable for the following components:  ? WBC 14.4 (*)   ? All other components within normal limits  ?TROPONIN I (HIGH SENSITIVITY) - Abnormal; Notable for the following components:  ? Troponin I (High Sensitivity) 164 (*)   ? All other components within normal limits  ?TROPONIN I (HIGH SENSITIVITY)  ?  ?DG Chest 2 View  ?Final Result  ?  ?  ?Medications  ?nitroGLYCERIN (NITROSTAT) SL tablet 0.4 mg (has no administration in time range)  ?oxyCODONE (Oxy IR/ROXICODONE) immediate release tablet 5 mg (has no administration in time range)  ?  ? ?Procedures  /  Critical Care ?.Critical Care ?Performed by: Maudie Flakes, MD ?Authorized by: Maudie Flakes, MD  ? ?Critical care provider statement:  ?  Critical care time (minutes):  35 ?  Critical care was necessary to treat or prevent imminent or life-threatening deterioration of the following conditions: NSTEMI. ?  Critical care was time spent personally by me on the following activities:  Development of treatment plan with patient or surrogate, discussions with  consultants, evaluation of patient's response to treatment, examination of patient, ordering and review of laboratory studies, ordering and review of radiographic studies, ordering and performing treatments and interventions, pulse oximetry, re-evaluation of patient's condition and review of old charts ? ?ED Course and Medical Decision Making  ?Initial Impression and Ddx ?Chest pain, multiple cardiovascular risk factors, concern for ACS, doubt PE, doubt dissection.  EKG without obvious ischemic findings.  Initial troponin is 164, patient still with some pain.  Providing nitro, oxycodone, will admit to cardiology. ? ?Past medical/surgical history that increases complexity of ED encounter: Obesity, A-fib, CHF ? ?Interpretation of Diagnostics ?I personally reviewed the EKG and my interpretation is  as follows: Sinus rhythm ?   ? ?Patient Reassessment and Ultimate Disposition/Management ?Admission to cardiology ? ?Patient management required discussion with the following services or consulting groups:  Cardiology ? ?Complexity of Problems Addressed ?Acute illness or injury that poses threat of life of bodily function ? ?Additional Data Reviewed and Analyzed ?Further history obtained from: ?Past medical history and medications listed in the EMR ? ?Additional Factors Impacting ED Encounter Risk ?Consideration of hospitalization ? ?Barth Kirks. Sedonia Small, MD ?[facility] Emergency Medicine ?[place] ?[email or web address] ? ?Final Clinical Impressions(s) / ED Diagnoses  ? ?  ICD-10-CM   ?1. NSTEMI (non-ST elevated myocardial infarction) ([place])  I21.4   ?  ?  ?ED Discharge Orders   ? ? None  ? ?  ?  ? ?Discharge Instructions Discussed with and Provided to Patient:  ? ?Discharge Instructions   ?None ?  ? ?  ?Maudie Flakes, MD ?03/14/22 [phone] ? ?

## 2022-03-15 LAB — LIPID PANEL
Cholesterol: 165 mg/dL (ref 0–200)
HDL: 36 mg/dL — ABNORMAL LOW (ref >40)
LDL Cholesterol: 111 mg/dL — ABNORMAL HIGH (ref 0–99)
Total CHOL/HDL Ratio: 4.6 RATIO
Triglycerides: 91 mg/dL (ref <150)
VLDL: 18 mg/dL (ref 0–40)

## 2022-03-15 LAB — BASIC METABOLIC PANEL
Anion gap: 10 (ref 5–15)
BUN: 12 mg/dL (ref 8–23)
CO2: 26 mmol/L (ref 22–32)
Calcium: 9.8 mg/dL (ref 8.9–10.3)
Chloride: 101 mmol/L (ref 98–111)
Creatinine, Ser: 0.61 mg/dL (ref 0.61–1.24)
GFR, Estimated: >60 mL/min (ref >60)
Glucose, Bld: 136 mg/dL — ABNORMAL HIGH (ref 70–99)
Potassium: 4.1 mmol/L (ref 3.5–5.1)
Sodium: 137 mmol/L (ref 135–145)

## 2022-03-15 LAB — CBC
HCT: 46.1 % (ref 39.0–52.0)
Hemoglobin: 14.8 g/dL (ref 13.0–17.0)
MCH: 27.7 pg (ref 26.0–34.0)
MCHC: 32.1 g/dL (ref 30.0–36.0)
MCV: 86.3 fL (ref 80.0–100.0)
Platelets: 329 10*3/uL (ref 150–400)
RBC: 5.34 MIL/uL (ref 4.22–5.81)
RDW: 14.7 % (ref 11.5–15.5)
WBC: 13.6 10*3/uL — ABNORMAL HIGH (ref 4.0–10.5)
nRBC: 0 % (ref 0.0–0.2)

## 2022-03-15 LAB — APTT
aPTT: 49 seconds — ABNORMAL HIGH (ref 24–36)
aPTT: 66 seconds — ABNORMAL HIGH (ref 24–36)

## 2022-03-15 LAB — HEPARIN LEVEL (UNFRACTIONATED): Heparin Unfractionated: 0.52 IU/mL (ref 0.30–0.70)

## 2022-03-15 MED ORDER — HEPARIN BOLUS VIA INFUSION
2000.0000 [IU] | Freq: Once | INTRAVENOUS | Status: AC
  Administered 2022-03-15: 2000 [IU] via INTRAVENOUS
  Filled 2022-03-15: qty 2000

## 2022-03-15 MED ORDER — SODIUM CHLORIDE 0.9 % IV SOLN: INTRAVENOUS | Status: DC

## 2022-03-15 MED ORDER — SODIUM CHLORIDE 0.9 % IV SOLN: 250.0000 mL | INTRAVENOUS | Status: DC | PRN

## 2022-03-15 MED ORDER — ALPRAZOLAM 0.25 MG PO TABS
0.2500 mg | ORAL_TABLET | Freq: Two times a day (BID) | ORAL | Status: DC
  Administered 2022-03-15 – 2022-03-23 (×13): 0.25 mg via ORAL
  Filled 2022-03-15 (×16): qty 1

## 2022-03-15 MED ORDER — SODIUM CHLORIDE 0.9% FLUSH: 3.0000 mL | INTRAVENOUS | Status: DC | PRN

## 2022-03-15 MED ORDER — MUSCLE RUB 10-15 % EX CREA
TOPICAL_CREAM | CUTANEOUS | Status: DC | PRN
  Administered 2022-03-15: 1 via TOPICAL
  Filled 2022-03-15: qty 85

## 2022-03-15 MED ORDER — SODIUM CHLORIDE 0.9% FLUSH
3.0000 mL | Freq: Two times a day (BID) | INTRAVENOUS | Status: DC
  Administered 2022-03-15 – 2022-03-18 (×5): 3 mL via INTRAVENOUS

## 2022-03-15 NOTE — H&P (View-Only) (Signed)
Ref: Virgilio Belling, PA-C ? ? ?Subjective:  ?Asking for Zofran for nausea. ?Awaiting cardiac cath for abnormal troponin I and chest pain. ?Mildly elevated LDL at 111 mg. ? ?Objective:  ?Vital Signs in the last 24 hours: ?Temp:  [98 ?F (36.7 ?C)-98.5 ?F (36.9 ?C)] 98 ?F (36.7 ?C) (04/02 2006) ?Pulse Rate:  [62-89] 69 (04/02 2006) ?Cardiac Rhythm: Sinus bradycardia;Other (Comment) (04/02 1900) ?Resp:  [15-24] 15 (04/02 2006) ?BP: (106-129)/(47-101) 129/67 (04/02 2006) ?SpO2:  [92 %-95 %] 93 % (04/02 2006) ?Weight:  [138.9 kg] 138.9 kg (04/02 0538) ? ?Physical Exam: ?BP Readings from Last 1 Encounters:  ?03/15/22 129/67  ?   ?Wt Readings from Last 1 Encounters:  ?03/15/22 (!) 138.9 kg  ?  Weight change: 0.285 kg Body mass index is 40.41 kg/m?. ?HEENT: [place]/AT, Eyes-Blue, Conjunctiva-Pink, Sclera-Non-icteric ?Neck: No JVD, No bruit, Trachea midline. ?Lungs:  Clear, Bilateral. ?Cardiac:  Regular rhythm, normal S1 and S2, no S3. II/VI systolic murmur. ?Abdomen:  Soft, distendednon-tender. BS present. ?Extremities:  No edema present. No cyanosis. No clubbing. ?CNS: AxOx3, Cranial nerves grossly intact, moves all 4 extremities.  ?Skin: Warm and dry. ? ? ?Intake/Output from previous day: ?04/01 0701 - 04/02 0700 ?In: 75.3 [I.V.:75.3] ?Out: 2300 [Urine:2300] ? ? ? ?Lab Results: ?BMET ?   ?Component Value Date/Time  ? NA 137 03/15/2022 0408  ? NA 136 03/14/2022 0332  ? NA 140 03/07/2020 1446  ? NA 140 08/02/2019 1419  ? NA 140 07/13/2019 1218  ? K 4.1 03/15/2022 0408  ? K 4.1 03/14/2022 0332  ? K 4.5 03/07/2020 1446  ? CL 101 03/15/2022 0408  ? CL 99 03/14/2022 0332  ? CL 96 03/07/2020 1446  ? CO2 26 03/15/2022 0408  ? CO2 27 03/14/2022 0332  ? CO2 30 (H) 03/07/2020 1446  ? GLUCOSE 136 (H) 03/15/2022 0408  ? GLUCOSE 112 (H) 03/14/2022 0332  ? GLUCOSE 134 (H) 03/07/2020 1446  ? GLUCOSE 128 (H) 08/02/2019 1419  ? GLUCOSE 122 (H) 07/13/2019 1218  ? GLUCOSE 99 07/23/2015 1449  ? BUN 12 03/15/2022 0408  ? BUN 18 03/14/2022 0332   ? BUN 19 03/07/2020 1446  ? BUN 15 08/02/2019 1419  ? BUN 14 07/13/2019 1218  ? CREATININE 0.61 03/15/2022 0408  ? CREATININE 0.77 03/14/2022 0332  ? CREATININE 0.68 (L) 03/07/2020 1446  ? CREATININE 1.08 04/23/2015 1443  ? CREATININE 1.00 03/15/2015 1542  ? CALCIUM 9.8 03/15/2022 0408  ? CALCIUM 9.8 03/14/2022 0332  ? CALCIUM 9.3 03/07/2020 1446  ? GFRNONAA >60 03/15/2022 0408  ? GFRNONAA >60 03/14/2022 0332  ? GFRNONAA 102 03/07/2020 1446  ? GFRNONAA 99 08/02/2019 1419  ? GFRNONAA 106 07/13/2019 1218  ? GFRNONAA 83 03/15/2015 1542  ? GFRAA 118 03/07/2020 1446  ? GFRAA 115 08/02/2019 1419  ? GFRAA 123 07/13/2019 1218  ? GFRAA >89 03/15/2015 1542  ? ?CBC ?   ?Component Value Date/Time  ? WBC 13.6 (H) 03/15/2022 0408  ? RBC 5.34 03/15/2022 0408  ? HGB 14.8 03/15/2022 0408  ? HGB 15.5 11/30/2018 1204  ? HCT 46.1 03/15/2022 0408  ? HCT 45.3 11/30/2018 1204  ? PLT 329 03/15/2022 0408  ? PLT 229 11/30/2018 1204  ? MCV 86.3 03/15/2022 0408  ? MCV 88 11/30/2018 1204  ? MCH 27.7 03/15/2022 0408  ? MCHC 32.1 03/15/2022 0408  ? RDW 14.7 03/15/2022 0408  ? RDW 12.0 (L) 11/30/2018 1204  ? LYMPHSABS 3.0 06/12/2019 1603  ? MONOABS 0.7 06/12/2019 1603  ?  EOSABS 0.3 06/12/2019 1603  ? BASOSABS 0.1 06/12/2019 1603  ? ?HEPATIC Function Panel ?No results for input(s): PROT in the last 8760 hours. ? ?Invalid input(s):  ALBUMIN,  AST,  ALT,  ALKPHOS,  BILIDIR,  IBILI ?HEMOGLOBIN A1C ?No components found for: HGA1C,  MPG ?CARDIAC ENZYMES ?Lab Results  ?Component Value Date  ? TROPONINI 0.04 (H) 03/05/2015  ? TROPONINI 0.05 (H) 03/05/2015  ? TROPONINI 0.05 (H) 03/05/2015  ? ?BNP ?No results for input(s): PROBNP in the last 8760 hours. ?TSH ?No results for input(s): TSH in the last 8760 hours. ?CHOLESTEROL ?Recent Labs  ?  03/15/22 ?0408  ?CHOL 165  ? ? ?Scheduled Meds: ? ALPRAZolam  0.25 mg Oral BID  ? aspirin EC  81 mg Oral Daily  ? atorvastatin  80 mg Oral q1800  ? carvedilol  25 mg Oral BID WC  ? dofetilide  500 mcg Oral BID  ?  fluticasone furoate-vilanterol  1 puff Inhalation Daily  ? And  ? umeclidinium bromide  1 puff Inhalation Daily  ? lisinopril  10 mg Oral Daily  ? loratadine  10 mg Oral Daily  ? magnesium oxide  400 mg Oral QHS  ? montelukast  10 mg Oral QHS  ? pantoprazole  40 mg Oral Daily  ? sertraline  50 mg Oral Daily  ? spironolactone  25 mg Oral Daily  ? ?Continuous Infusions: ? heparin 2,250 Units/hr (03/15/22 1533)  ? ?PRN Meds:.acetaminophen, calcium carbonate, diphenhydrAMINE, Muscle Rub, nitroGLYCERIN, ondansetron (ZOFRAN) IV, oxymetazoline ? ?Assessment/Plan: ? Acute coronary syndrome ?Chronic diastolic left heart failure ?Paroxysmal atrial fibrillation ?Morbid obesity ?CAD ?HTN ? ?Plan: ?Cardiac catheterization in AM. ?Zofran use as needed. ? ? LOS: 1 day  ? ?Time spent including chart review, lab review, examination, discussion with patient : 30 min ? ? ?Sydni Elizarraraz  MD  ?03/15/2022, 9:07 PM ? ? ?  ?

## 2022-03-15 NOTE — Progress Notes (Signed)
?  Transition of Care (TOC) Screening Note ? ? ?Patient Details  ?Name: Arthur Church ?Date of Birth: Oct 24, 1956 ? ? ?Transition of Care (TOC) CM/SW Contact:    ?Patrice Paradise, LCSW ?Phone Number: ?03/15/2022, 12:42 PM ? ? ? ?Transition of Care Department [facility]) has reviewed patient and no TOC needs have been identified at this time. We will continue to monitor patient advancement through interdisciplinary progression rounds. If new patient transition needs arise, please place a TOC consult. ?  ?

## 2022-03-15 NOTE — Progress Notes (Signed)
ANTICOAGULATION CONSULT NOTE ? ?Pharmacy Consult for Heparin ?Indication: chest pain/ACS ? ?No Known Allergies ? ?Patient Measurements: ?Height: 6\' 1"  (185.4 cm) ?Weight:  (pt refused due to pain.) ?IBW/kg (Calculated) : 79.9 ?Heparin Dosing Weight: 112 kg ? ?Vital Signs: ?Temp: 98.1 ?F (36.7 ?C) (04/01 2344) ?Temp Source: Oral (04/01 2344) ?BP: 127/101 (04/02 0425) ?Pulse Rate: 89 (04/02 0425) ? ?Labs: ?Recent Labs  ?  03/14/22 ?0332 03/14/22 ?05/14/22 03/14/22 ?1618 03/15/22 ?0408  ?HGB 15.0  --   --  14.8  ?HCT 49.2  --   --  46.1  ?PLT 330  --   --  329  ?APTT  --   --  40* 49*  ?HEPARINUNFRC  --   --  0.76* 0.52  ?CREATININE 0.77  --   --  0.61  ?TROPONINIHS 164* 152*  --   --   ? ? ? ?Estimated Creatinine Clearance: 137.6 mL/min (by C-G formula based on SCr of 0.61 mg/dL). ? ? ?Assessment: ? 66 years old white male with PMH of CHF, Obesity, atrial fibrillation on Eliquis PTA has intermittent chest pain with radiation to both shoulders. Last Eliquis dose pm on 3/31.Heparin gtt for ACS, but will need to monitor aPTT and Heparin levels due to the interference of Eliquis with the heparin levels.  Will follow both until they correlate. ? ?Heparin level 0.52 (affected by Eliquis), aPTT remains subtherapeutic (49 sec) on infusion at 1800 units/hr.  ? ?Goal of Therapy:  ?Heparin level 0.3-0.7 units/ml ?aPTT 66-102 seconds ?Monitor platelets by anticoagulation protocol: Yes ?  ?Plan:  ?Rebolus heparin 2000 units and increase heparin infusion to 2100 units/hr ?Check aPTT in 6 hours ? ?4/31, PharmD, BCPS ?Please see amion for complete clinical pharmacist phone list ?03/15/2022 5:39 AM ? ? ? ? ?

## 2022-03-15 NOTE — Progress Notes (Signed)
0428 Pt complaining chest pressure and mainly left shoulder pain. He said, he was just voiding when it happen.EKG was obtained see Result. He's pain was relieved while laying on his back getting the EKG. He then requested tylenol instead.  Pt is expecting for cardiac cath today but no order and no schedule. Kept sips of water till  further notice. ?

## 2022-03-15 NOTE — Progress Notes (Signed)
ANTICOAGULATION CONSULT NOTE ? ?Pharmacy Consult for Heparin ?Indication: chest pain/ACS ? ?No Known Allergies ? ?Patient Measurements: ?Height: 6\' 1"  (185.4 cm) ?Weight: (!) 138.9 kg (306 lb 4.8 oz) ?IBW/kg (Calculated) : 79.9 ?Heparin Dosing Weight: 112 kg ? ?Vital Signs: ?Temp: 98.3 ?F (36.8 ?C) (04/02 1135) ?Temp Source: Oral (04/02 1135) ?BP: 113/70 (04/02 1135) ?Pulse Rate: 62 (04/02 1135) ? ?Labs: ?Recent Labs  ?  03/14/22 ?0332 03/14/22 ?05/14/22 03/14/22 ?1618 03/15/22 ?0408 03/15/22 ?1127  ?HGB 15.0  --   --  14.8  --   ?HCT 49.2  --   --  46.1  --   ?PLT 330  --   --  329  --   ?APTT  --   --  40* 49* 66*  ?HEPARINUNFRC  --   --  0.76* 0.52  --   ?CREATININE 0.77  --   --  0.61  --   ?TROPONINIHS 164* 152*  --   --   --   ? ? ? ?Estimated Creatinine Clearance: 136.6 mL/min (by C-G formula based on SCr of 0.61 mg/dL). ? ? ?Assessment: ? 66 years old white male with PMH of CHF, Obesity, atrial fibrillation on Eliquis PTA has intermittent chest pain with radiation to both shoulders. Last Eliquis dose pm on 3/31.Heparin gtt for ACS, but will need to monitor aPTT and Heparin levels due to the interference of Eliquis with the heparin levels.  Will follow both until they correlate. ? ?PM update: aPTT 66 seconds on lower end of goal, no infusion problems or bleeding.  ? ?Goal of Therapy:  ?Heparin level 0.3-0.7 units/ml ?aPTT 66-102 seconds ?Monitor platelets by anticoagulation protocol: Yes ?  ?Plan:  ?Empirically increase heparin infusion to 2250 units/hr  ?Confirmatory aPTT in 6 hours ?Daily aPTT, heparin level until correlating ?Monitor s/sx bleeding, CBC daily ? ?4/31, PharmD ?PGY1 Pharmacy Resident ?03/15/2022  12:32 PM ? ?Please check [email or web address] for unit-specific pharmacy phone numbers. ? ?

## 2022-03-15 NOTE — Progress Notes (Signed)
Ref: Virgilio Belling, PA-C ? ? ?Subjective:  ?Asking for Zofran for nausea. ?Awaiting cardiac cath for abnormal troponin I and chest pain. ?Mildly elevated LDL at 111 mg. ? ?Objective:  ?Vital Signs in the last 24 hours: ?Temp:  [98 ?F (36.7 ?C)-98.5 ?F (36.9 ?C)] 98 ?F (36.7 ?C) (04/02 2006) ?Pulse Rate:  [62-89] 69 (04/02 2006) ?Cardiac Rhythm: Sinus bradycardia;Other (Comment) (04/02 1900) ?Resp:  [15-24] 15 (04/02 2006) ?BP: (106-129)/(47-101) 129/67 (04/02 2006) ?SpO2:  [92 %-95 %] 93 % (04/02 2006) ?Weight:  [138.9 kg] 138.9 kg (04/02 0538) ? ?Physical Exam: ?BP Readings from Last 1 Encounters:  ?03/15/22 129/67  ?   ?Wt Readings from Last 1 Encounters:  ?03/15/22 (!) 138.9 kg  ?  Weight change: 0.285 kg Body mass index is 40.41 kg/m?. ?HEENT: [place]/AT, Eyes-Blue, Conjunctiva-Pink, Sclera-Non-icteric ?Neck: No JVD, No bruit, Trachea midline. ?Lungs:  Clear, Bilateral. ?Cardiac:  Regular rhythm, normal S1 and S2, no S3. II/VI systolic murmur. ?Abdomen:  Soft, distendednon-tender. BS present. ?Extremities:  No edema present. No cyanosis. No clubbing. ?CNS: AxOx3, Cranial nerves grossly intact, moves all 4 extremities.  ?Skin: Warm and dry. ? ? ?Intake/Output from previous day: ?04/01 0701 - 04/02 0700 ?In: 75.3 [I.V.:75.3] ?Out: 2300 [Urine:2300] ? ? ? ?Lab Results: ?BMET ?   ?Component Value Date/Time  ? NA 137 03/15/2022 0408  ? NA 136 03/14/2022 0332  ? NA 140 03/07/2020 1446  ? NA 140 08/02/2019 1419  ? NA 140 07/13/2019 1218  ? K 4.1 03/15/2022 0408  ? K 4.1 03/14/2022 0332  ? K 4.5 03/07/2020 1446  ? CL 101 03/15/2022 0408  ? CL 99 03/14/2022 0332  ? CL 96 03/07/2020 1446  ? CO2 26 03/15/2022 0408  ? CO2 27 03/14/2022 0332  ? CO2 30 (H) 03/07/2020 1446  ? GLUCOSE 136 (H) 03/15/2022 0408  ? GLUCOSE 112 (H) 03/14/2022 0332  ? GLUCOSE 134 (H) 03/07/2020 1446  ? GLUCOSE 128 (H) 08/02/2019 1419  ? GLUCOSE 122 (H) 07/13/2019 1218  ? GLUCOSE 99 07/23/2015 1449  ? BUN 12 03/15/2022 0408  ? BUN 18 03/14/2022 0332   ? BUN 19 03/07/2020 1446  ? BUN 15 08/02/2019 1419  ? BUN 14 07/13/2019 1218  ? CREATININE 0.61 03/15/2022 0408  ? CREATININE 0.77 03/14/2022 0332  ? CREATININE 0.68 (L) 03/07/2020 1446  ? CREATININE 1.08 04/23/2015 1443  ? CREATININE 1.00 03/15/2015 1542  ? CALCIUM 9.8 03/15/2022 0408  ? CALCIUM 9.8 03/14/2022 0332  ? CALCIUM 9.3 03/07/2020 1446  ? GFRNONAA >60 03/15/2022 0408  ? GFRNONAA >60 03/14/2022 0332  ? GFRNONAA 102 03/07/2020 1446  ? GFRNONAA 99 08/02/2019 1419  ? GFRNONAA 106 07/13/2019 1218  ? GFRNONAA 83 03/15/2015 1542  ? GFRAA 118 03/07/2020 1446  ? GFRAA 115 08/02/2019 1419  ? GFRAA 123 07/13/2019 1218  ? GFRAA >89 03/15/2015 1542  ? ?CBC ?   ?Component Value Date/Time  ? WBC 13.6 (H) 03/15/2022 0408  ? RBC 5.34 03/15/2022 0408  ? HGB 14.8 03/15/2022 0408  ? HGB 15.5 11/30/2018 1204  ? HCT 46.1 03/15/2022 0408  ? HCT 45.3 11/30/2018 1204  ? PLT 329 03/15/2022 0408  ? PLT 229 11/30/2018 1204  ? MCV 86.3 03/15/2022 0408  ? MCV 88 11/30/2018 1204  ? MCH 27.7 03/15/2022 0408  ? MCHC 32.1 03/15/2022 0408  ? RDW 14.7 03/15/2022 0408  ? RDW 12.0 (L) 11/30/2018 1204  ? LYMPHSABS 3.0 06/12/2019 1603  ? MONOABS 0.7 06/12/2019 1603  ?  EOSABS 0.3 06/12/2019 1603  ? BASOSABS 0.1 06/12/2019 1603  ? ?HEPATIC Function Panel ?No results for input(s): PROT in the last 8760 hours. ? ?Invalid input(s):  ALBUMIN,  AST,  ALT,  ALKPHOS,  BILIDIR,  IBILI ?HEMOGLOBIN A1C ?No components found for: HGA1C,  MPG ?CARDIAC ENZYMES ?Lab Results  ?Component Value Date  ? TROPONINI 0.04 (H) 03/05/2015  ? TROPONINI 0.05 (H) 03/05/2015  ? TROPONINI 0.05 (H) 03/05/2015  ? ?BNP ?No results for input(s): PROBNP in the last 8760 hours. ?TSH ?No results for input(s): TSH in the last 8760 hours. ?CHOLESTEROL ?Recent Labs  ?  03/15/22 ?0408  ?CHOL 165  ? ? ?Scheduled Meds: ? ALPRAZolam  0.25 mg Oral BID  ? aspirin EC  81 mg Oral Daily  ? atorvastatin  80 mg Oral q1800  ? carvedilol  25 mg Oral BID WC  ? dofetilide  500 mcg Oral BID  ?  fluticasone furoate-vilanterol  1 puff Inhalation Daily  ? And  ? umeclidinium bromide  1 puff Inhalation Daily  ? lisinopril  10 mg Oral Daily  ? loratadine  10 mg Oral Daily  ? magnesium oxide  400 mg Oral QHS  ? montelukast  10 mg Oral QHS  ? pantoprazole  40 mg Oral Daily  ? sertraline  50 mg Oral Daily  ? spironolactone  25 mg Oral Daily  ? ?Continuous Infusions: ? heparin 2,250 Units/hr (03/15/22 1533)  ? ?PRN Meds:.acetaminophen, calcium carbonate, diphenhydrAMINE, Muscle Rub, nitroGLYCERIN, ondansetron (ZOFRAN) IV, oxymetazoline ? ?Assessment/Plan: ? Acute coronary syndrome ?Chronic diastolic left heart failure ?Paroxysmal atrial fibrillation ?Morbid obesity ?CAD ?HTN ? ?Plan: ?Cardiac catheterization in AM. ?Zofran use as needed. ? ? LOS: 1 day  ? ?Time spent including chart review, lab review, examination, discussion with patient : 30 min ? ? ?[name]  MD  ?03/15/2022, 9:07 PM ? ? ?  ?

## 2022-03-15 NOTE — Plan of Care (Signed)
?  Problem: Education: ?Goal: Ability to demonstrate management of disease process will improve ?Outcome: Progressing ?  ?Problem: Activity: ?Goal: Capacity to carry out activities will improve ?Outcome: Progressing ?  ?Problem: Cardiac: ?Goal: Ability to achieve and maintain adequate cardiopulmonary perfusion will improve ?Outcome: Progressing ?  ?

## 2022-03-16 ENCOUNTER — Encounter (HOSPITAL_COMMUNITY): Payer: Self-pay | Admitting: Cardiovascular Disease

## 2022-03-16 ENCOUNTER — Encounter (HOSPITAL_COMMUNITY)
Admission: EM | Disposition: A | Discharge status: Home Health | Payer: Self-pay | Source: Home / Self Care | Attending: Cardiovascular Disease

## 2022-03-16 HISTORY — PX: LEFT HEART CATH: CATH118248

## 2022-03-16 LAB — CBC
HCT: 43.7 % (ref 39.0–52.0)
Hemoglobin: 14 g/dL (ref 13.0–17.0)
MCH: 27.5 pg (ref 26.0–34.0)
MCHC: 32 g/dL (ref 30.0–36.0)
MCV: 85.9 fL (ref 80.0–100.0)
Platelets: 297 10*3/uL (ref 150–400)
RBC: 5.09 MIL/uL (ref 4.22–5.81)
RDW: 14.5 % (ref 11.5–15.5)
WBC: 12.9 10*3/uL — ABNORMAL HIGH (ref 4.0–10.5)
nRBC: 0 % (ref 0.0–0.2)

## 2022-03-16 LAB — HEPARIN LEVEL (UNFRACTIONATED): Heparin Unfractionated: 0.5 IU/mL (ref 0.30–0.70)

## 2022-03-16 LAB — APTT: aPTT: 76 seconds — ABNORMAL HIGH (ref 24–36)

## 2022-03-16 SURGERY — LEFT HEART CATH
Anesthesia: LOCAL

## 2022-03-16 MED ORDER — FENTANYL CITRATE (PF) 100 MCG/2ML IJ SOLN
INTRAMUSCULAR | Status: DC | PRN
  Administered 2022-03-16: 25 ug via INTRAVENOUS

## 2022-03-16 MED ORDER — MIDAZOLAM HCL 2 MG/2ML IJ SOLN
INTRAMUSCULAR | Status: AC
  Filled 2022-03-16: qty 2

## 2022-03-16 MED ORDER — IOHEXOL 350 MG/ML SOLN
INTRAVENOUS | Status: DC | PRN
  Administered 2022-03-16: 110 mL

## 2022-03-16 MED ORDER — SODIUM CHLORIDE 0.9 % IV SOLN: INTRAVENOUS | Status: AC

## 2022-03-16 MED ORDER — SODIUM CHLORIDE 0.9% FLUSH: 3.0000 mL | INTRAVENOUS | Status: DC | PRN

## 2022-03-16 MED ORDER — VERAPAMIL HCL 2.5 MG/ML IV SOLN
INTRAVENOUS | Status: AC
Start: 2022-03-16 — End: ?
  Filled 2022-03-16: qty 2

## 2022-03-16 MED ORDER — HEPARIN SODIUM (PORCINE) 1000 UNIT/ML IJ SOLN
INTRAMUSCULAR | Status: DC | PRN
  Administered 2022-03-16: 8000 [IU] via INTRAVENOUS

## 2022-03-16 MED ORDER — VERAPAMIL HCL 2.5 MG/ML IV SOLN
INTRAVENOUS | Status: DC | PRN
  Administered 2022-03-16: 10 mL via INTRA_ARTERIAL

## 2022-03-16 MED ORDER — SODIUM CHLORIDE 0.9% FLUSH
3.0000 mL | Freq: Two times a day (BID) | INTRAVENOUS | Status: DC
  Administered 2022-03-16 – 2022-03-18 (×6): 3 mL via INTRAVENOUS

## 2022-03-16 MED ORDER — HEPARIN (PORCINE) IN NACL 1000-0.9 UT/500ML-% IV SOLN
INTRAVENOUS | Status: AC
  Filled 2022-03-16: qty 1000

## 2022-03-16 MED ORDER — HEPARIN SODIUM (PORCINE) 1000 UNIT/ML IJ SOLN
INTRAMUSCULAR | Status: AC
  Filled 2022-03-16: qty 10

## 2022-03-16 MED ORDER — LIDOCAINE HCL (PF) 1 % IJ SOLN
INTRAMUSCULAR | Status: DC | PRN
  Administered 2022-03-16: 2 mL

## 2022-03-16 MED ORDER — HEPARIN (PORCINE) IN NACL 1000-0.9 UT/500ML-% IV SOLN
INTRAVENOUS | Status: DC | PRN
  Administered 2022-03-16 (×2): 500 mL

## 2022-03-16 MED ORDER — LIDOCAINE HCL (PF) 1 % IJ SOLN
INTRAMUSCULAR | Status: AC
Start: 2022-03-16 — End: ?
  Filled 2022-03-16: qty 30

## 2022-03-16 MED ORDER — MIDAZOLAM HCL 2 MG/2ML IJ SOLN
INTRAMUSCULAR | Status: DC | PRN
  Administered 2022-03-16: 1 mg via INTRAVENOUS

## 2022-03-16 MED ORDER — HYDRALAZINE HCL 20 MG/ML IJ SOLN: 10.0000 mg | INTRAMUSCULAR | Status: AC | PRN

## 2022-03-16 MED ORDER — HEPARIN (PORCINE) 25000 UT/250ML-% IV SOLN
2250.0000 [IU]/h | INTRAVENOUS | Status: DC
  Administered 2022-03-16 – 2022-03-18 (×4): 2250 [IU]/h via INTRAVENOUS
  Filled 2022-03-16 (×4): qty 250

## 2022-03-16 MED ORDER — FENTANYL CITRATE (PF) 100 MCG/2ML IJ SOLN
INTRAMUSCULAR | Status: AC
Start: 2022-03-16 — End: ?
  Filled 2022-03-16: qty 2

## 2022-03-16 MED ORDER — SODIUM CHLORIDE 0.9 % IV SOLN: 250.0000 mL | INTRAVENOUS | Status: DC | PRN

## 2022-03-16 MED ORDER — LABETALOL HCL 5 MG/ML IV SOLN: 10.0000 mg | INTRAVENOUS | Status: AC | PRN

## 2022-03-16 SURGICAL SUPPLY — 11 items
BAND ZEPHYR COMPRESS 30 LONG (HEMOSTASIS) ×1 IMPLANT
CATH 5FR JL3.5 JR4 ANG PIG MP (CATHETERS) ×1 IMPLANT
CATH LAUNCHER 5F NOTO (CATHETERS) IMPLANT
CATHETER LAUNCHER 5F NOTO (CATHETERS) ×2
GLIDESHEATH SLEND SS 6F .021 (SHEATH) ×1 IMPLANT
GUIDEWIRE INQWIRE 1.5J.035X260 (WIRE) IMPLANT
INQWIRE 1.5J .035X260CM (WIRE) ×2
KIT HEART LEFT (KITS) ×2 IMPLANT
PACK CARDIAC CATHETERIZATION (CUSTOM PROCEDURE TRAY) ×2 IMPLANT
SHEATH PROBE COVER 6X72 (BAG) ×1 IMPLANT
TRANSDUCER W/STOPCOCK (MISCELLANEOUS) ×2 IMPLANT

## 2022-03-16 NOTE — Interval H&P Note (Signed)
History and Physical Interval Note: ? ?03/16/2022 ?10:01 AM ? ?[name]  has presented today for surgery, with the diagnosis of NSTEMI.  The various methods of treatment have been discussed with the patient and family. After consideration of risks, benefits and other options for treatment, the patient has consented to  Procedure(s): ?LEFT HEART CATH AND CORONARY ANGIOGRAPHY (N/A) as a surgical intervention.  The patient's history has been reviewed, patient examined, no change in status, stable for surgery.  I have reviewed the patient's chart and labs.  Questions were answered to the patient's satisfaction.   ? ? ?[name] ? ? ?

## 2022-03-16 NOTE — Progress Notes (Signed)
ANTICOAGULATION CONSULT NOTE ? ?Pharmacy Consult for Heparin ?Indication: chest pain/ACS ? ?No Known Allergies ? ?Patient Measurements: ?Height: 6\' 1"  (185.4 cm) ?Weight: (!) 140.7 kg (310 lb 3.2 oz) ?IBW/kg (Calculated) : 79.9 ?Heparin Dosing Weight: 112 kg ? ?Vital Signs: ?Temp: 98.1 ?F (36.7 ?C) (04/03 1100) ?Temp Source: Oral (04/03 1100) ?BP: 108/60 (04/03 1200) ?Pulse Rate: 61 (04/03 1246) ? ?Labs: ?Recent Labs  ?  03/14/22 ?0332 03/14/22 ?0332 03/14/22 ?[id number] 03/14/22 ?1618 03/15/22 ?0408 03/15/22 ?1127 03/16/22 ?0232  ?HGB 15.0  --   --   --  14.8  --  14.0  ?HCT 49.2  --   --   --  46.1  --  43.7  ?PLT 330  --   --   --  329  --  297  ?APTT  --    < >  --  40* 49* 66* 76*  ?HEPARINUNFRC  --   --   --  0.76* 0.52  --  0.50  ?CREATININE 0.77  --   --   --  0.61  --   --   ?TROPONINIHS 164*  --  152*  --   --   --   --   ? < > = values in this interval not displayed.  ? ? ? ?Estimated Creatinine Clearance: 137.5 mL/min (by C-G formula based on SCr of 0.61 mg/dL). ? ? ?Assessment: ? 66 years old white male with PMH of CHF, Obesity, atrial fibrillation on Eliquis PTA has intermittent chest pain with radiation to both shoulders. Last Eliquis dose pm on 3/31.Heparin gtt for ACS, but will need to monitor aPTT and Heparin levels due to the interference of Eliquis with the heparin levels. ? ?S/p cath - > pending decision regarding stent vs cabg ? ?Goal of Therapy:  ?Heparin level 0.3-0.7 units/ml ?aPTT 66-102 seconds ?Monitor platelets by anticoagulation protocol: Yes ?  ?Plan:  ?Resume heparin tonight at 8 pm at 2250 units / hr ?Daily aPTT, heparin level until correlating ?Monitor s/sx bleeding, CBC daily ? ?Thank you ?Anette Guarneri, PharmD ?03/16/2022  1:57 PM ? ?Please check [email or web address] for unit-specific pharmacy phone numbers. ? ?

## 2022-03-16 NOTE — Plan of Care (Signed)
?  Problem: Education: ?Goal: Ability to demonstrate management of disease process will improve ?Outcome: Progressing ?  ?Problem: Activity: ?Goal: Capacity to carry out activities will improve ?Outcome: Progressing ?  ?Problem: Cardiac: ?Goal: Ability to achieve and maintain adequate cardiopulmonary perfusion will improve ?Outcome: Progressing ?  ?

## 2022-03-16 NOTE — Progress Notes (Signed)
At 0001 Pt complaint of chest pressure radiating to his left shoulder, pain scale of 8 out of 10 BP of 153/81. Administer Nitro sublingual after 8 minutes pt stated pain was weaning off to 2 out of 10. Recheck blood pressure 110/69.  ?

## 2022-03-17 ENCOUNTER — Ambulatory Visit: Payer: Self-pay | Admitting: General Practice

## 2022-03-17 LAB — BASIC METABOLIC PANEL
Anion gap: 9 (ref 5–15)
BUN: 13 mg/dL (ref 8–23)
CO2: 26 mmol/L (ref 22–32)
Calcium: 9.2 mg/dL (ref 8.9–10.3)
Chloride: 101 mmol/L (ref 98–111)
Creatinine, Ser: 0.64 mg/dL (ref 0.61–1.24)
GFR, Estimated: >60 mL/min (ref >60)
Glucose, Bld: 91 mg/dL (ref 70–99)
Potassium: 4.2 mmol/L (ref 3.5–5.1)
Sodium: 136 mmol/L (ref 135–145)

## 2022-03-17 LAB — CBC
HCT: 41.2 % (ref 39.0–52.0)
Hemoglobin: 13.6 g/dL (ref 13.0–17.0)
MCH: 28.2 pg (ref 26.0–34.0)
MCHC: 33 g/dL (ref 30.0–36.0)
MCV: 85.3 fL (ref 80.0–100.0)
Platelets: 287 10*3/uL (ref 150–400)
RBC: 4.83 MIL/uL (ref 4.22–5.81)
RDW: 14.4 % (ref 11.5–15.5)
WBC: 12.5 10*3/uL — ABNORMAL HIGH (ref 4.0–10.5)
nRBC: 0 % (ref 0.0–0.2)

## 2022-03-17 LAB — HEPARIN LEVEL (UNFRACTIONATED): Heparin Unfractionated: 0.63 IU/mL (ref 0.30–0.70)

## 2022-03-17 LAB — APTT: aPTT: 119 seconds — ABNORMAL HIGH (ref 24–36)

## 2022-03-17 NOTE — Progress Notes (Signed)
Per CCMD pt had 3 beats of Vtach non sustain. Upon assessment patient on bed no complain of pain. Vital sign as follows: BP 98/43 HR 70 Resp. 19, O2 98% at 2L of O2. ?

## 2022-03-17 NOTE — Plan of Care (Signed)
  Problem: Clinical Measurements: Goal: Ability to maintain clinical measurements within normal limits will improve Outcome: Progressing   Problem: Activity: Goal: Risk for activity intolerance will decrease Outcome: Progressing   Problem: Nutrition: Goal: Adequate nutrition will be maintained Outcome: Progressing   Problem: Coping: Goal: Level of anxiety will decrease Outcome: Progressing   Problem: Pain Managment: Goal: General experience of comfort will improve Outcome: Progressing   Problem: Safety: Goal: Ability to remain free from injury will improve Outcome: Progressing   

## 2022-03-17 NOTE — Plan of Care (Signed)
?  Problem: Education: ?Goal: Ability to demonstrate management of disease process will improve ?Outcome: Progressing ?  ?Problem: Activity: ?Goal: Capacity to carry out activities will improve ?Outcome: Progressing ?  ?Problem: Cardiac: ?Goal: Ability to achieve and maintain adequate cardiopulmonary perfusion will improve ?Outcome: Progressing ?  ?

## 2022-03-17 NOTE — H&P (View-Only) (Signed)
Ref: Virgilio Belling, PA-C ? ? ?Subjective:  ?Awake. Occasional chest discomfort. Renal function stable post cardiac catheterization. ?Discussed PTCA/Stenting RCA and LAD with Dr. Herbie Baltimore. ? ?Objective:  ?Vital Signs in the last 24 hours: ?Temp:  [98 ?F (36.7 ?C)-98.3 ?F (36.8 ?C)] 98.2 ?F (36.8 ?C) (04/04 2004) ?Pulse Rate:  [60-66] 60 (04/04 2004) ?Cardiac Rhythm: Normal sinus rhythm (04/04 1902) ?Resp:  [14-20] 20 (04/04 2004) ?BP: (98-113)/(43-69) 110/54 (04/04 2004) ?SpO2:  [90 %-98 %] 92 % (04/04 2004) ?Weight:  [140 kg] 140 kg (04/04 0407) ? ?Physical Exam: ?BP Readings from Last 1 Encounters:  ?03/17/22 (!) 110/54  ?   ?Wt Readings from Last 1 Encounters:  ?03/17/22 (!) 140 kg  ?  Weight change: -0.726 kg Body mass index is 40.71 kg/m?. ?HEENT: [place]/AT, Eyes-Blue, Conjunctiva-Pink, Sclera-Non-icteric ?Neck: No JVD, No bruit, Trachea midline. ?Lungs:  Clear, Bilateral. ?Cardiac:  Regular rhythm, normal S1 and S2, no S3. II/VI systolic murmur. ?Abdomen:  Soft, non-tender. BS present. ?Extremities:  No edema present. No cyanosis. No clubbing. ?CNS: AxOx3, Cranial nerves grossly intact, moves all 4 extremities.  ?Skin: Warm and dry. ? ? ?Intake/Output from previous day: ?04/03 0701 - 04/04 0700 ?In: 1157.6 [P.O.:657; I.V.:500.6] ?Out: 3900 [Urine:3900] ? ? ? ?Lab Results: ?BMET ?   ?Component Value Date/Time  ? NA 136 03/17/2022 0357  ? NA 137 03/15/2022 0408  ? NA 136 03/14/2022 0332  ? NA 140 03/07/2020 1446  ? NA 140 08/02/2019 1419  ? NA 140 07/13/2019 1218  ? K 4.2 03/17/2022 0357  ? K 4.1 03/15/2022 0408  ? K 4.1 03/14/2022 0332  ? CL 101 03/17/2022 0357  ? CL 101 03/15/2022 0408  ? CL 99 03/14/2022 0332  ? CO2 26 03/17/2022 0357  ? CO2 26 03/15/2022 0408  ? CO2 27 03/14/2022 0332  ? GLUCOSE 91 03/17/2022 0357  ? GLUCOSE 136 (H) 03/15/2022 0408  ? GLUCOSE 112 (H) 03/14/2022 0332  ? BUN 13 03/17/2022 0357  ? BUN 12 03/15/2022 0408  ? BUN 18 03/14/2022 0332  ? BUN 19 03/07/2020 1446  ? BUN 15 08/02/2019  1419  ? BUN 14 07/13/2019 1218  ? CREATININE 0.64 03/17/2022 0357  ? CREATININE 0.61 03/15/2022 0408  ? CREATININE 0.77 03/14/2022 0332  ? CREATININE 1.08 04/23/2015 1443  ? CREATININE 1.00 03/15/2015 1542  ? CALCIUM 9.2 03/17/2022 0357  ? CALCIUM 9.8 03/15/2022 0408  ? CALCIUM 9.8 03/14/2022 0332  ? GFRNONAA >60 03/17/2022 0357  ? GFRNONAA >60 03/15/2022 0408  ? GFRNONAA >60 03/14/2022 0332  ? GFRNONAA 83 03/15/2015 1542  ? GFRAA 118 03/07/2020 1446  ? GFRAA 115 08/02/2019 1419  ? GFRAA 123 07/13/2019 1218  ? GFRAA >89 03/15/2015 1542  ? ?CBC ?   ?Component Value Date/Time  ? WBC 12.5 (H) 03/17/2022 0357  ? RBC 4.83 03/17/2022 0357  ? HGB 13.6 03/17/2022 0357  ? HGB 15.5 11/30/2018 1204  ? HCT 41.2 03/17/2022 0357  ? HCT 45.3 11/30/2018 1204  ? PLT 287 03/17/2022 0357  ? PLT 229 11/30/2018 1204  ? MCV 85.3 03/17/2022 0357  ? MCV 88 11/30/2018 1204  ? MCH 28.2 03/17/2022 0357  ? MCHC 33.0 03/17/2022 0357  ? RDW 14.4 03/17/2022 0357  ? RDW 12.0 (L) 11/30/2018 1204  ? LYMPHSABS 3.0 06/12/2019 1603  ? MONOABS 0.7 06/12/2019 1603  ? EOSABS 0.3 06/12/2019 1603  ? BASOSABS 0.1 06/12/2019 1603  ? ?HEPATIC Function Panel ?No results for input(s): PROT in the last 8760  hours. ? ?Invalid input(s):  ALBUMIN,  AST,  ALT,  ALKPHOS,  BILIDIR,  IBILI ?HEMOGLOBIN A1C ?No components found for: HGA1C,  MPG ?CARDIAC ENZYMES ?Lab Results  ?Component Value Date  ? TROPONINI 0.04 (H) 03/05/2015  ? TROPONINI 0.05 (H) 03/05/2015  ? TROPONINI 0.05 (H) 03/05/2015  ? ?BNP ?No results for input(s): PROBNP in the last 8760 hours. ?TSH ?No results for input(s): TSH in the last 8760 hours. ?CHOLESTEROL ?Recent Labs  ?  03/15/22 ?0408  ?CHOL 165  ? ? ?Scheduled Meds: ? ALPRAZolam  0.25 mg Oral BID  ? aspirin EC  81 mg Oral Daily  ? atorvastatin  80 mg Oral q1800  ? carvedilol  25 mg Oral BID WC  ? dofetilide  500 mcg Oral BID  ? fluticasone furoate-vilanterol  1 puff Inhalation Daily  ? And  ? umeclidinium bromide  1 puff Inhalation Daily  ?  lisinopril  10 mg Oral Daily  ? loratadine  10 mg Oral Daily  ? magnesium oxide  400 mg Oral QHS  ? montelukast  10 mg Oral QHS  ? pantoprazole  40 mg Oral Daily  ? sertraline  50 mg Oral Daily  ? sodium chloride flush  3 mL Intravenous Q12H  ? sodium chloride flush  3 mL Intravenous Q12H  ? spironolactone  25 mg Oral Daily  ? ?Continuous Infusions: ? sodium chloride    ? heparin 2,250 Units/hr (03/17/22 1537)  ? ?PRN Meds:.sodium chloride, acetaminophen, calcium carbonate, diphenhydrAMINE, Muscle Rub, nitroGLYCERIN, ondansetron (ZOFRAN) IV, sodium chloride flush ? ?Assessment/Plan: ? Acute coronary syndrome ?Multivessel native vessel CAD ?Paroxysmal atrial fibrillation ?Morbid obesity ?CAD ?HTN ? ?Plan: ?Coronary artery stenting by Dr. Clifton James ? ? ? LOS: 3 days  ? ?Time spent including chart review, lab review, examination, discussion with patient : 30 min ? ? ?[name]  MD  ?03/17/2022, 10:12 PM ? ? ? ? ?

## 2022-03-17 NOTE — Progress Notes (Signed)
Ref: Arthur Church, Arthur T, PA-C ? ? ?Subjective:  ?Awake. Occasional chest discomfort. Renal function stable post cardiac catheterization. ?Discussed PTCA/Stenting RCA and LAD with Dr. Harding. ? ?Objective:  ?Vital Signs in the last 24 hours: ?Temp:  [98 ?F (36.7 ?C)-98.3 ?F (36.8 ?C)] 98.2 ?F (36.8 ?C) (04/04 2004) ?Pulse Rate:  [60-66] 60 (04/04 2004) ?Cardiac Rhythm: Normal sinus rhythm (04/04 1902) ?Resp:  [14-20] 20 (04/04 2004) ?BP: (98-113)/(43-69) 110/54 (04/04 2004) ?SpO2:  [90 %-98 %] 92 % (04/04 2004) ?Weight:  [140 kg] 140 kg (04/04 0407) ? ?Physical Exam: ?BP Readings from Last 1 Encounters:  ?03/17/22 (!) 110/54  ?   ?Wt Readings from Last 1 Encounters:  ?03/17/22 (!) 140 kg  ?  Weight change: -0.726 kg Body mass index is 40.71 kg/m?. ?HEENT: Arthur Church/AT, Eyes-Blue, Conjunctiva-Pink, Sclera-Non-icteric ?Neck: No JVD, No bruit, Trachea midline. ?Lungs:  Clear, Bilateral. ?Cardiac:  Regular rhythm, normal S1 and S2, no S3. II/VI systolic murmur. ?Abdomen:  Soft, non-tender. BS present. ?Extremities:  No edema present. No cyanosis. No clubbing. ?CNS: AxOx3, Cranial nerves grossly intact, moves all 4 extremities.  ?Skin: Warm and dry. ? ? ?Intake/Output from previous day: ?04/03 0701 - 04/04 0700 ?In: 1157.6 [P.O.:657; I.V.:500.6] ?Out: 3900 [Urine:3900] ? ? ? ?Lab Results: ?BMET ?   ?Component Value Date/Time  ? NA 136 03/17/2022 0357  ? NA 137 03/15/2022 0408  ? NA 136 03/14/2022 0332  ? NA 140 03/07/2020 1446  ? NA 140 08/02/2019 1419  ? NA 140 07/13/2019 1218  ? K 4.2 03/17/2022 0357  ? K 4.1 03/15/2022 0408  ? K 4.1 03/14/2022 0332  ? CL 101 03/17/2022 0357  ? CL 101 03/15/2022 0408  ? CL 99 03/14/2022 0332  ? CO2 26 03/17/2022 0357  ? CO2 26 03/15/2022 0408  ? CO2 27 03/14/2022 0332  ? GLUCOSE 91 03/17/2022 0357  ? GLUCOSE 136 (H) 03/15/2022 0408  ? GLUCOSE 112 (H) 03/14/2022 0332  ? BUN 13 03/17/2022 0357  ? BUN 12 03/15/2022 0408  ? BUN 18 03/14/2022 0332  ? BUN 19 03/07/2020 1446  ? BUN 15 08/02/2019  1419  ? BUN 14 07/13/2019 1218  ? CREATININE 0.64 03/17/2022 0357  ? CREATININE 0.61 03/15/2022 0408  ? CREATININE 0.77 03/14/2022 0332  ? CREATININE 1.08 04/23/2015 1443  ? CREATININE 1.00 03/15/2015 1542  ? CALCIUM 9.2 03/17/2022 0357  ? CALCIUM 9.8 03/15/2022 0408  ? CALCIUM 9.8 03/14/2022 0332  ? GFRNONAA >60 03/17/2022 0357  ? GFRNONAA >60 03/15/2022 0408  ? GFRNONAA >60 03/14/2022 0332  ? GFRNONAA 83 03/15/2015 1542  ? GFRAA 118 03/07/2020 1446  ? GFRAA 115 08/02/2019 1419  ? GFRAA 123 07/13/2019 1218  ? GFRAA >89 03/15/2015 1542  ? ?CBC ?   ?Component Value Date/Time  ? WBC 12.5 (H) 03/17/2022 0357  ? RBC 4.83 03/17/2022 0357  ? HGB 13.6 03/17/2022 0357  ? HGB 15.5 11/30/2018 1204  ? HCT 41.2 03/17/2022 0357  ? HCT 45.3 11/30/2018 1204  ? PLT 287 03/17/2022 0357  ? PLT 229 11/30/2018 1204  ? MCV 85.3 03/17/2022 0357  ? MCV 88 11/30/2018 1204  ? MCH 28.2 03/17/2022 0357  ? MCHC 33.0 03/17/2022 0357  ? RDW 14.4 03/17/2022 0357  ? RDW 12.0 (L) 11/30/2018 1204  ? LYMPHSABS 3.0 06/12/2019 1603  ? MONOABS 0.7 06/12/2019 1603  ? EOSABS 0.3 06/12/2019 1603  ? BASOSABS 0.1 06/12/2019 1603  ? ?HEPATIC Function Panel ?No results for input(s): PROT in the last 8760   hours. ? ?Invalid input(s):  ALBUMIN,  AST,  ALT,  ALKPHOS,  BILIDIR,  IBILI ?HEMOGLOBIN A1C ?No components found for: HGA1C,  MPG ?CARDIAC ENZYMES ?Lab Results  ?Component Value Date  ? TROPONINI 0.04 (H) 03/05/2015  ? TROPONINI 0.05 (H) 03/05/2015  ? TROPONINI 0.05 (H) 03/05/2015  ? ?BNP ?No results for input(s): PROBNP in the last 8760 hours. ?TSH ?No results for input(s): TSH in the last 8760 hours. ?CHOLESTEROL ?Recent Labs  ?  03/15/22 ?0408  ?CHOL 165  ? ? ?Scheduled Meds: ? ALPRAZolam  0.25 mg Oral BID  ? aspirin EC  81 mg Oral Daily  ? atorvastatin  80 mg Oral q1800  ? carvedilol  25 mg Oral BID WC  ? dofetilide  500 mcg Oral BID  ? fluticasone furoate-vilanterol  1 puff Inhalation Daily  ? And  ? umeclidinium bromide  1 puff Inhalation Daily  ?  lisinopril  10 mg Oral Daily  ? loratadine  10 mg Oral Daily  ? magnesium oxide  400 mg Oral QHS  ? montelukast  10 mg Oral QHS  ? pantoprazole  40 mg Oral Daily  ? sertraline  50 mg Oral Daily  ? sodium chloride flush  3 mL Intravenous Q12H  ? sodium chloride flush  3 mL Intravenous Q12H  ? spironolactone  25 mg Oral Daily  ? ?Continuous Infusions: ? sodium chloride    ? heparin 2,250 Units/hr (03/17/22 1537)  ? ?PRN Meds:.sodium chloride, acetaminophen, calcium carbonate, diphenhydrAMINE, Muscle Rub, nitroGLYCERIN, ondansetron (ZOFRAN) IV, sodium chloride flush ? ?Assessment/Plan: ? Acute coronary syndrome ?Multivessel native vessel CAD ?Paroxysmal atrial fibrillation ?Morbid obesity ?CAD ?HTN ? ?Plan: ?Coronary artery stenting by Dr. Clifton James ? ? ? LOS: 3 days  ? ?Time spent including chart review, lab review, examination, discussion with patient : 30 min ? ? ?Arthur Cobb  MD  ?03/17/2022, 10:12 PM ? ? ? ? ?

## 2022-03-18 ENCOUNTER — Encounter (HOSPITAL_COMMUNITY): Payer: Self-pay | Admitting: Cardiovascular Disease

## 2022-03-18 ENCOUNTER — Inpatient Hospital Stay (HOSPITAL_COMMUNITY)
Admission: EM | Disposition: A | Discharge status: Home Health | Payer: Self-pay | Source: Home / Self Care | Attending: Cardiovascular Disease

## 2022-03-18 DIAGNOSIS — I214 Non-ST elevation (NSTEMI) myocardial infarction: Secondary | ICD-10-CM | POA: Diagnosis not present

## 2022-03-18 HISTORY — PX: CORONARY ANGIOGRAPHY: CATH118303

## 2022-03-18 LAB — BASIC METABOLIC PANEL
Anion gap: 8 (ref 5–15)
BUN: 15 mg/dL (ref 8–23)
CO2: 26 mmol/L (ref 22–32)
Calcium: 9.1 mg/dL (ref 8.9–10.3)
Chloride: 98 mmol/L (ref 98–111)
Creatinine, Ser: 0.64 mg/dL (ref 0.61–1.24)
GFR, Estimated: >60 mL/min (ref >60)
Glucose, Bld: 98 mg/dL (ref 70–99)
Potassium: 4.1 mmol/L (ref 3.5–5.1)
Sodium: 132 mmol/L — ABNORMAL LOW (ref 135–145)

## 2022-03-18 LAB — CBC
HCT: 40.8 % (ref 39.0–52.0)
Hemoglobin: 13.1 g/dL (ref 13.0–17.0)
MCH: 27.5 pg (ref 26.0–34.0)
MCHC: 32.1 g/dL (ref 30.0–36.0)
MCV: 85.7 fL (ref 80.0–100.0)
Platelets: 263 10*3/uL (ref 150–400)
RBC: 4.76 MIL/uL (ref 4.22–5.81)
RDW: 14.4 % (ref 11.5–15.5)
WBC: 12.5 10*3/uL — ABNORMAL HIGH (ref 4.0–10.5)
nRBC: 0 % (ref 0.0–0.2)

## 2022-03-18 LAB — HEPARIN LEVEL (UNFRACTIONATED): Heparin Unfractionated: 0.7 IU/mL (ref 0.30–0.70)

## 2022-03-18 LAB — POCT ACTIVATED CLOTTING TIME
Activated Clotting Time: 263 seconds
Activated Clotting Time: 293 seconds

## 2022-03-18 SURGERY — CORONARY ANGIOGRAPHY (CATH LAB)
Anesthesia: LOCAL

## 2022-03-18 MED ORDER — MORPHINE SULFATE (PF) 2 MG/ML IV SOLN
INTRAVENOUS | Status: AC
  Filled 2022-03-18: qty 1

## 2022-03-18 MED ORDER — SODIUM CHLORIDE 0.9% FLUSH: 3.0000 mL | INTRAVENOUS | Status: DC | PRN

## 2022-03-18 MED ORDER — HEPARIN SODIUM (PORCINE) 1000 UNIT/ML IJ SOLN
INTRAMUSCULAR | Status: AC
  Filled 2022-03-18: qty 10

## 2022-03-18 MED ORDER — HEPARIN SODIUM (PORCINE) 1000 UNIT/ML IJ SOLN
INTRAMUSCULAR | Status: DC | PRN
  Administered 2022-03-18: 15000 [IU] via INTRAVENOUS

## 2022-03-18 MED ORDER — SODIUM CHLORIDE 0.9 % IV SOLN: INTRAVENOUS | Status: AC

## 2022-03-18 MED ORDER — SODIUM CHLORIDE 0.9% FLUSH
3.0000 mL | Freq: Two times a day (BID) | INTRAVENOUS | Status: DC
  Administered 2022-03-23: 3 mL via INTRAVENOUS

## 2022-03-18 MED ORDER — TICAGRELOR 90 MG PO TABS
ORAL_TABLET | ORAL | Status: DC | PRN
  Administered 2022-03-18: 180 mg via ORAL

## 2022-03-18 MED ORDER — CARVEDILOL 3.125 MG PO TABS
3.1250 mg | ORAL_TABLET | Freq: Two times a day (BID) | ORAL | Status: DC
  Administered 2022-03-19 – 2022-03-23 (×10): 3.125 mg via ORAL
  Filled 2022-03-18 (×11): qty 1

## 2022-03-18 MED ORDER — MIDAZOLAM HCL 2 MG/2ML IJ SOLN
INTRAMUSCULAR | Status: DC | PRN
  Administered 2022-03-18 (×2): 1 mg via INTRAVENOUS

## 2022-03-18 MED ORDER — FENTANYL CITRATE (PF) 100 MCG/2ML IJ SOLN
INTRAMUSCULAR | Status: DC | PRN
  Administered 2022-03-18 (×2): 25 ug via INTRAVENOUS

## 2022-03-18 MED ORDER — FENTANYL CITRATE (PF) 100 MCG/2ML IJ SOLN
INTRAMUSCULAR | Status: AC
  Filled 2022-03-18: qty 2

## 2022-03-18 MED ORDER — SODIUM CHLORIDE 0.9 % IV SOLN: 250.0000 mL | INTRAVENOUS | Status: DC | PRN

## 2022-03-18 MED ORDER — HEPARIN (PORCINE) IN NACL 1000-0.9 UT/500ML-% IV SOLN
INTRAVENOUS | Status: DC | PRN
  Administered 2022-03-18 (×2): 500 mL

## 2022-03-18 MED ORDER — HEPARIN (PORCINE) IN NACL 1000-0.9 UT/500ML-% IV SOLN
INTRAVENOUS | Status: AC
Start: 2022-03-18 — End: ?
  Filled 2022-03-18: qty 500

## 2022-03-18 MED ORDER — SODIUM CHLORIDE 0.9 % IV SOLN
INTRAVENOUS | Status: DC
Start: 2022-03-18 — End: 2022-03-22

## 2022-03-18 MED ORDER — VERAPAMIL HCL 2.5 MG/ML IV SOLN
INTRAVENOUS | Status: AC
  Filled 2022-03-18: qty 2

## 2022-03-18 MED ORDER — SODIUM CHLORIDE 0.9 % IV BOLUS
250.0000 mL | Freq: Once | INTRAVENOUS | Status: AC
  Administered 2022-03-18: 250 mL via INTRAVENOUS

## 2022-03-18 MED ORDER — IOHEXOL 350 MG/ML SOLN
INTRAVENOUS | Status: DC | PRN
  Administered 2022-03-18: 110 mL via INTRA_ARTERIAL

## 2022-03-18 MED ORDER — OXYCODONE HCL 5 MG PO TABS
5.0000 mg | ORAL_TABLET | Freq: Four times a day (QID) | ORAL | Status: DC | PRN
  Filled 2022-03-18: qty 1

## 2022-03-18 MED ORDER — LIDOCAINE HCL (PF) 1 % IJ SOLN
INTRAMUSCULAR | Status: DC | PRN
  Administered 2022-03-18: 2 mL

## 2022-03-18 MED ORDER — NITROGLYCERIN 1 MG/10 ML FOR IR/CATH LAB
INTRA_ARTERIAL | Status: AC
  Filled 2022-03-18: qty 10

## 2022-03-18 MED ORDER — MIDAZOLAM HCL 2 MG/2ML IJ SOLN
INTRAMUSCULAR | Status: AC
  Filled 2022-03-18: qty 2

## 2022-03-18 MED ORDER — HEPARIN (PORCINE) IN NACL 1000-0.9 UT/500ML-% IV SOLN
INTRAVENOUS | Status: AC
  Filled 2022-03-18: qty 500

## 2022-03-18 MED ORDER — HEPARIN (PORCINE) 25000 UT/250ML-% IV SOLN
2250.0000 [IU]/h | INTRAVENOUS | Status: DC
  Administered 2022-03-18 – 2022-03-21 (×7): 2200 [IU]/h via INTRAVENOUS
  Administered 2022-03-22 – 2022-03-23 (×4): 2250 [IU]/h via INTRAVENOUS
  Filled 2022-03-18 (×12): qty 250

## 2022-03-18 MED ORDER — VERAPAMIL HCL 2.5 MG/ML IV SOLN
INTRAVENOUS | Status: DC | PRN
  Administered 2022-03-18: 10 mL via INTRA_ARTERIAL

## 2022-03-18 MED ORDER — MORPHINE SULFATE (PF) 2 MG/ML IV SOLN
1.0000 mg | Freq: Once | INTRAVENOUS | Status: DC
Start: 2022-03-18 — End: 2022-03-24

## 2022-03-18 MED ORDER — LABETALOL HCL 5 MG/ML IV SOLN: 10.0000 mg | INTRAVENOUS | Status: AC | PRN

## 2022-03-18 MED ORDER — ZOLPIDEM TARTRATE 5 MG PO TABS
5.0000 mg | ORAL_TABLET | Freq: Every evening | ORAL | Status: DC | PRN
  Administered 2022-03-18 – 2022-03-22 (×5): 5 mg via ORAL
  Filled 2022-03-18 (×5): qty 1

## 2022-03-18 MED ORDER — HYDRALAZINE HCL 20 MG/ML IJ SOLN: 10.0000 mg | INTRAMUSCULAR | Status: AC | PRN

## 2022-03-18 SURGICAL SUPPLY — 15 items
CATH LAUNCHER 6FR AL.75 (CATHETERS) ×1 IMPLANT
CATH LAUNCHER 6FR AR1 SH (CATHETERS) ×1 IMPLANT
CATH LAUNCHER 6FR HS (CATHETERS) ×1 IMPLANT
CATH LAUNCHER 6FR JR4 (CATHETERS) ×1 IMPLANT
GLIDESHEATH SLEND SS 6F .021 (SHEATH) ×1 IMPLANT
GUIDEWIRE INQWIRE 1.5J.035X260 (WIRE) IMPLANT
INQWIRE 1.5J .035X260CM (WIRE) ×2
KIT ENCORE 26 ADVANTAGE (KITS) ×1 IMPLANT
KIT HEART LEFT (KITS) ×2 IMPLANT
PACK CARDIAC CATHETERIZATION (CUSTOM PROCEDURE TRAY) ×2 IMPLANT
SHEATH PINNACLE 6F 10CM (SHEATH) IMPLANT
TRANSDUCER W/STOPCOCK (MISCELLANEOUS) ×2 IMPLANT
TUBING CIL FLEX 10 FLL-RA (TUBING) ×2 IMPLANT
WIRE COUGAR XT STRL 300CM (WIRE) ×1 IMPLANT
WIRE EMERALD 3MM-J .035X150CM (WIRE) IMPLANT

## 2022-03-18 NOTE — Interval H&P Note (Signed)
History and Physical Interval Note: ? ?03/18/2022 ?12:11 PM ? ?Arthur Church  has presented today for surgery, with the diagnosis of cad.  The various methods of treatment have been discussed with the patient and family. After consideration of risks, benefits and other options for treatment, the patient has consented to  Procedure(s): ?CORONARY ATHERECTOMY (N/A) as a surgical intervention.  The patient's history has been reviewed, patient examined, no change in status, stable for surgery.  I have reviewed the patient's chart and labs.  Questions were answered to the patient's satisfaction.   ? ?Cath Lab Visit (complete for each Cath Lab visit) ? ?Clinical Evaluation Leading to the Procedure:  ? ?ACS: Yes.   ? ?Non-ACS:   ? ?Anginal Classification: CCS IV ? ?Anti-ischemic medical therapy: Minimal Therapy (1 class of medications) ? ?Non-Invasive Test Results: No non-invasive testing performed ? ?Prior CABG: No previous CABG ? ? ? ? ? ? ? ?Arthur Church ? ? ?

## 2022-03-18 NOTE — Progress Notes (Incomplete)
ANTICOAGULATION CONSULT NOTE ? ?Pharmacy Consult for Heparin ?Indication: chest pain/ACS ? ?No Known Allergies ? ?Patient Measurements: ?Height: 6\' 1"  (185.4 cm) ?Weight: (!) 139.8 kg (308 lb 3.3 oz) ?IBW/kg (Calculated) : 79.9 ?Heparin Dosing Weight: 112 kg ? ?Vital Signs: ?Temp: 98.1 ?F (36.7 ?C) (04/05 [id number]) ?Temp Source: Oral (04/05 [id number]) ?BP: 133/69 (04/05 0733) ?Pulse Rate: 67 (04/05 0737) ? ?Labs: ?Recent Labs  ?  03/16/22 ?0232 03/17/22 ?0357 03/18/22 ?0411  ?HGB 14.0 13.6 13.1  ?HCT 43.7 41.2 40.8  ?PLT 297 287 263  ?APTT 76* 119*  --   ?HEPARINUNFRC 0.50 0.63 0.70  ?CREATININE  --  0.64 0.64  ? ? ? ?Estimated Creatinine Clearance: 137.1 mL/min (by C-G formula based on SCr of 0.64 mg/dL). ? ? ?Assessment: ? 66 years old white male with PMH of CHF, Obesity, atrial fibrillation on Eliquis PTA has intermittent chest pain with radiation to both shoulders. Cath found 2 vessel disease with inability to place percutaneous stent. Last Eliquis dose pm on 3/31. Pharmacy consulted for heparin to start after TR band removal.  ? ?Patient was previously at high end of therapeutic goal on 2250 units/hr. H/H 13s, plt stable. ? ?Goal of Therapy:  ?Heparin level 0.3-0.7 units/ml ?aPTT 66-102 seconds ?Monitor platelets by anticoagulation protocol: Yes ?  ?Plan:  ?Resume heparin 2200 units / hr at *** pm ?Monitor daily heparin level, CBC ?Monitor for signs/symptoms of bleeding  ? ? ?Benetta Spar, PharmD, BCPS, BCCP ?Clinical Pharmacist ? ?Please check AMION for all [facility] phone numbers ?After 10:00 PM, call [facility] [phone] ? ?

## 2022-03-18 NOTE — Care Management Important Message (Signed)
Important Message ? ?Patient Details  ?Name: Arthur Church ?MRN: [id number] ?Date of Birth: 22-Feb-1956 ? ? ?Medicare Important Message Given:  Yes ? ? ? ? ?Renie Ora ?03/18/2022, 8:07 AM ?

## 2022-03-18 NOTE — Progress Notes (Signed)
Ref: Virgilio Belling, PA-C ? ? ?Subjective:  ?Failed attempt to RCA angioplasty per radial approach per Dr. Clifton James. ?Patient may consider CABG per his recommendation. ?Discussed options with the patient.  ? ?Objective:  ?Vital Signs in the last 24 hours: ?Temp:  [98 ?F (36.7 ?C)-98.8 ?F (37.1 ?C)] 98 ?F (36.7 ?C) (04/05 1737) ?Pulse Rate:  [55-86] 64 (04/05 1737) ?Cardiac Rhythm: Normal sinus rhythm;Heart block (04/05 1400) ?Resp:  [18-22] 18 (04/05 1737) ?BP: (93-148)/(42-78) 109/59 (04/05 1737) ?SpO2:  [90 %-99 %] 97 % (04/05 1530) ?Weight:  [139.8 kg] 139.8 kg (04/05 0456) ? ?Physical Exam: ?BP Readings from Last 1 Encounters:  ?03/18/22 (!) 109/59  ?   ?Wt Readings from Last 1 Encounters:  ?03/18/22 (!) 139.8 kg  ?  Weight change: -0.18 kg Body mass index is 40.66 kg/m?. ?HEENT: [place]/AT, Eyes-Blue, Conjunctiva-Pink, Sclera-Non-icteric ?Neck: No JVD, No bruit, Trachea midline. ?Lungs:  Clear, Bilateral. ?Cardiac:  Regular rhythm, normal S1 and S2, no S3. II/VI systolic murmur. ?Abdomen:  Soft, non-tender. BS present. ?Extremities:  No edema present. No cyanosis. No clubbing. ?CNS: AxOx3, Cranial nerves grossly intact, moves all 4 extremities.  ?Skin: Warm and dry. ? ? ?Intake/Output from previous day: ?04/04 0701 - 04/05 0700 ?In: 1860.7 [P.O.:1317; I.V.:543.7] ?Out: 3400 [Urine:3400] ? ? ? ?Lab Results: ?BMET ?   ?Component Value Date/Time  ? NA 132 (L) 03/18/2022 0411  ? NA 136 03/17/2022 0357  ? NA 137 03/15/2022 0408  ? NA 140 03/07/2020 1446  ? NA 140 08/02/2019 1419  ? NA 140 07/13/2019 1218  ? K 4.1 03/18/2022 0411  ? K 4.2 03/17/2022 0357  ? K 4.1 03/15/2022 0408  ? CL 98 03/18/2022 0411  ? CL 101 03/17/2022 0357  ? CL 101 03/15/2022 0408  ? CO2 26 03/18/2022 0411  ? CO2 26 03/17/2022 0357  ? CO2 26 03/15/2022 0408  ? GLUCOSE 98 03/18/2022 0411  ? GLUCOSE 91 03/17/2022 0357  ? GLUCOSE 136 (H) 03/15/2022 0408  ? BUN 15 03/18/2022 0411  ? BUN 13 03/17/2022 0357  ? BUN 12 03/15/2022 0408  ? BUN 19  03/07/2020 1446  ? BUN 15 08/02/2019 1419  ? BUN 14 07/13/2019 1218  ? CREATININE 0.64 03/18/2022 0411  ? CREATININE 0.64 03/17/2022 0357  ? CREATININE 0.61 03/15/2022 0408  ? CREATININE 1.08 04/23/2015 1443  ? CREATININE 1.00 03/15/2015 1542  ? CALCIUM 9.1 03/18/2022 0411  ? CALCIUM 9.2 03/17/2022 0357  ? CALCIUM 9.8 03/15/2022 0408  ? GFRNONAA >60 03/18/2022 0411  ? GFRNONAA >60 03/17/2022 0357  ? GFRNONAA >60 03/15/2022 0408  ? GFRNONAA 83 03/15/2015 1542  ? GFRAA 118 03/07/2020 1446  ? GFRAA 115 08/02/2019 1419  ? GFRAA 123 07/13/2019 1218  ? GFRAA >89 03/15/2015 1542  ? ?CBC ?   ?Component Value Date/Time  ? WBC 12.5 (H) 03/18/2022 0411  ? RBC 4.76 03/18/2022 0411  ? HGB 13.1 03/18/2022 0411  ? HGB 15.5 11/30/2018 1204  ? HCT 40.8 03/18/2022 0411  ? HCT 45.3 11/30/2018 1204  ? PLT 263 03/18/2022 0411  ? PLT 229 11/30/2018 1204  ? MCV 85.7 03/18/2022 0411  ? MCV 88 11/30/2018 1204  ? MCH 27.5 03/18/2022 0411  ? MCHC 32.1 03/18/2022 0411  ? RDW 14.4 03/18/2022 0411  ? RDW 12.0 (L) 11/30/2018 1204  ? LYMPHSABS 3.0 06/12/2019 1603  ? MONOABS 0.7 06/12/2019 1603  ? EOSABS 0.3 06/12/2019 1603  ? BASOSABS 0.1 06/12/2019 1603  ? ?HEPATIC Function Panel ?No results  for input(s): PROT in the last 8760 hours. ? ?Invalid input(s):  ALBUMIN,  AST,  ALT,  ALKPHOS,  BILIDIR,  IBILI ?HEMOGLOBIN A1C ?No components found for: HGA1C,  MPG ?CARDIAC ENZYMES ?Lab Results  ?Component Value Date  ? TROPONINI 0.04 (H) 03/05/2015  ? TROPONINI 0.05 (H) 03/05/2015  ? TROPONINI 0.05 (H) 03/05/2015  ? ?BNP ?No results for input(s): PROBNP in the last 8760 hours. ?TSH ?No results for input(s): TSH in the last 8760 hours. ?CHOLESTEROL ?Recent Labs  ?  03/15/22 ?0408  ?CHOL 165  ? ? ?Scheduled Meds: ? ALPRAZolam  0.25 mg Oral BID  ? aspirin EC  81 mg Oral Daily  ? atorvastatin  80 mg Oral q1800  ? [START ON 03/19/2022] carvedilol  3.125 mg Oral BID WC  ? dofetilide  500 mcg Oral BID  ? fluticasone furoate-vilanterol  1 puff Inhalation Daily  ?  And  ? umeclidinium bromide  1 puff Inhalation Daily  ? lisinopril  10 mg Oral Daily  ? loratadine  10 mg Oral Daily  ? magnesium oxide  400 mg Oral QHS  ? montelukast  10 mg Oral QHS  ?  morphine injection  1 mg Intravenous Once  ? morphine (PF)      ? pantoprazole  40 mg Oral Daily  ? sertraline  50 mg Oral Daily  ? sodium chloride flush  3 mL Intravenous Q12H  ? sodium chloride flush  3 mL Intravenous Q12H  ? sodium chloride flush  3 mL Intravenous Q12H  ? spironolactone  25 mg Oral Daily  ? ?Continuous Infusions: ? sodium chloride    ? sodium chloride Stopped (03/18/22 1415)  ? sodium chloride 50 mL/hr at 03/18/22 1449  ? sodium chloride    ? ?PRN Meds:.sodium chloride, sodium chloride, acetaminophen, calcium carbonate, diphenhydrAMINE, hydrALAZINE, labetalol, Muscle Rub, nitroGLYCERIN, ondansetron (ZOFRAN) IV, sodium chloride flush, sodium chloride flush ? ?Assessment/Plan: ? Acute coronary syndrome ?Multivessel CAD ?Paroxysmal atrial fibrillation ?Morbid obesity ?HTN ? ?Plan: ?CVTS consult. ? ? LOS: 4 days  ? ?Time spent including chart review, lab review, examination, discussion with patient/Doctor/Nurse : 30 min ? ? ?[name]  MD  ?03/18/2022, 6:24 PM ? ? ? ? ?

## 2022-03-18 NOTE — Progress Notes (Signed)
Pt had a episode of chest pain again that was radiating down bilateral arms. Spoke with Dr. Algie Coffer and advised to administer oxycodone for pain. Returned to pt room with med and pt declined med due to chest pain subsiding.  ?

## 2022-03-18 NOTE — Progress Notes (Signed)
Received pt from cath lab, pt started complaining of chest pain radiating into his shoulder. Pt states that "I feel like I am having an attack." EKG obtained. Cardiology contacted. Verbally advised order for 1 mg Morphine IV and 266mL NaCl bolus. Advised to hold nitro due to low blood pressure and SBP of 99. Pt placed on 4L of O2 [place].  ? ?Pt declined morphine after 5 minutes, states chest pain is better. Bolus was started.  ?

## 2022-03-18 NOTE — Progress Notes (Addendum)
ANTICOAGULATION CONSULT NOTE ? ?Pharmacy Consult for Heparin ?Indication: chest pain/ACS ? ?No Known Allergies ? ?Patient Measurements: ?Height: 6\' 1"  (185.4 cm) ?Weight: (!) 139.8 kg (308 lb 3.3 oz) ?IBW/kg (Calculated) : 79.9 ?Heparin Dosing Weight: 112 kg ? ?Vital Signs: ?Temp: 98 ?F (36.7 ?C) (04/05 1737) ?Temp Source: Oral (04/05 1737) ?BP: 109/59 (04/05 1737) ?Pulse Rate: 64 (04/05 1737) ? ?Labs: ?Recent Labs  ?  03/16/22 ?0232 03/17/22 ?0357 03/18/22 ?0411  ?HGB 14.0 13.6 13.1  ?HCT 43.7 41.2 40.8  ?PLT 297 287 263  ?APTT 76* 119*  --   ?HEPARINUNFRC 0.50 0.63 0.70  ?CREATININE  --  0.64 0.64  ? ? ? ?Estimated Creatinine Clearance: 137.1 mL/min (by C-G formula based on SCr of 0.64 mg/dL). ? ? ?Assessment: ?66 years old white male with PMH of CHF, obesity, atrial fibrillation on Eliquis PTA has intermittent chest pain with radiation to both shoulders. Cath found 2 vessel disease with inability to place percutaneous stent.  Last Eliquis dose pm on 3/31. Pharmacy consulted for heparin to start after TR band removal.  ? ?Patient was previously at high end of therapeutic goal on 2250 units/hr. H/H 13s, plt stable.  TR band removed around 1900 and no bleeding/hematoma per discussion with RN. ? ?Goal of Therapy:  ?Heparin level 0.3-0.7 units/ml ?aPTT 66-102 seconds ?Monitor platelets by anticoagulation protocol: Yes ?  ?Plan:  ?Resume heparin 2200 units / hr at 2200 ?Monitor daily heparin level and CBC ?Monitor for signs/symptoms of bleeding  ? ?Quentez Lober D. Mina Marble, PharmD, BCPS, BCCCP ?03/18/2022, 8:15 PM ? ?

## 2022-03-19 DIAGNOSIS — I5022 Chronic systolic (congestive) heart failure: Secondary | ICD-10-CM | POA: Diagnosis not present

## 2022-03-19 DIAGNOSIS — I25110 Atherosclerotic heart disease of native coronary artery with unstable angina pectoris: Secondary | ICD-10-CM | POA: Diagnosis not present

## 2022-03-19 DIAGNOSIS — I4819 Other persistent atrial fibrillation: Secondary | ICD-10-CM | POA: Diagnosis not present

## 2022-03-19 DIAGNOSIS — J449 Chronic obstructive pulmonary disease, unspecified: Secondary | ICD-10-CM | POA: Diagnosis not present

## 2022-03-19 LAB — BASIC METABOLIC PANEL
Anion gap: 6 (ref 5–15)
BUN: 11 mg/dL (ref 8–23)
CO2: 29 mmol/L (ref 22–32)
Calcium: 9.3 mg/dL (ref 8.9–10.3)
Chloride: 101 mmol/L (ref 98–111)
Creatinine, Ser: 0.63 mg/dL (ref 0.61–1.24)
GFR, Estimated: >60 mL/min (ref >60)
Glucose, Bld: 111 mg/dL — ABNORMAL HIGH (ref 70–99)
Potassium: 4.2 mmol/L (ref 3.5–5.1)
Sodium: 136 mmol/L (ref 135–145)

## 2022-03-19 LAB — CBC
HCT: 40.6 % (ref 39.0–52.0)
Hemoglobin: 12.9 g/dL — ABNORMAL LOW (ref 13.0–17.0)
MCH: 27.4 pg (ref 26.0–34.0)
MCHC: 31.8 g/dL (ref 30.0–36.0)
MCV: 86.2 fL (ref 80.0–100.0)
Platelets: 260 10*3/uL (ref 150–400)
RBC: 4.71 MIL/uL (ref 4.22–5.81)
RDW: 14.5 % (ref 11.5–15.5)
WBC: 12.5 10*3/uL — ABNORMAL HIGH (ref 4.0–10.5)
nRBC: 0 % (ref 0.0–0.2)

## 2022-03-19 LAB — HEPARIN LEVEL (UNFRACTIONATED): Heparin Unfractionated: 0.38 IU/mL (ref 0.30–0.70)

## 2022-03-19 MED ORDER — LORAZEPAM 1 MG PO TABS
1.0000 mg | ORAL_TABLET | Freq: Once | ORAL | Status: AC
  Administered 2022-03-19: 1 mg via ORAL
  Filled 2022-03-19: qty 1

## 2022-03-19 MED ORDER — LEVALBUTEROL HCL 1.25 MG/0.5ML IN NEBU
1.2500 mg | INHALATION_SOLUTION | Freq: Four times a day (QID) | RESPIRATORY_TRACT | Status: DC
Start: 2022-03-19 — End: 2022-03-20
  Filled 2022-03-19 (×7): qty 0.5

## 2022-03-19 MED ORDER — GUAIFENESIN ER 600 MG PO TB12
600.0000 mg | ORAL_TABLET | Freq: Two times a day (BID) | ORAL | Status: DC
Start: 2022-03-19 — End: 2022-03-22
  Filled 2022-03-19 (×4): qty 1

## 2022-03-19 MED ORDER — MAGNESIUM HYDROXIDE 400 MG/5ML PO SUSP
30.0000 mL | Freq: Every day | ORAL | Status: DC | PRN
  Administered 2022-03-19 – 2022-03-22 (×3): 30 mL via ORAL
  Filled 2022-03-19 (×3): qty 30

## 2022-03-19 NOTE — Progress Notes (Signed)
Ref: Virgilio Belling, PA-C ? ? ?Subjective:  ?Awake. Occasional chest pain.  ?VS stable. ? ?Objective:  ?Vital Signs in the last 24 hours: ?Temp:  [97.8 ?F (36.6 ?C)-98.8 ?F (37.1 ?C)] 97.8 ?F (36.6 ?C) (04/06 1336) ?Pulse Rate:  [61-70] 61 (04/06 1336) ?Cardiac Rhythm: Normal sinus rhythm;Bundle branch block;Heart block (04/06 0814) ?Resp:  [16-18] 16 (04/06 1336) ?BP: (87-120)/(47-62) 113/62 (04/06 1336) ?SpO2:  [90 %-98 %] 90 % (04/06 1336) ?Weight:  [142.3 kg] 142.3 kg (04/06 0500) ? ?Physical Exam: ?BP Readings from Last 1 Encounters:  ?03/19/22 113/62  ?   ?Wt Readings from Last 1 Encounters:  ?03/19/22 (!) 142.3 kg  ?  Weight change: 2.5 kg Body mass index is 41.39 kg/m?. ?HEENT: [place]/AT, Eyes-Blue, Conjunctiva-Pink, Sclera-Non-icteric ?Neck: No JVD, No bruit, Trachea midline. ?Lungs:  Clear, Bilateral. ?Cardiac:  Regular rhythm, normal S1 and S2, no S3. II/VI systolic murmur. ?Abdomen:  Soft, non-tender. BS present. ?Extremities:  No edema present. No cyanosis. No clubbing. Right radial cath site is stable. ?CNS: AxOx3, Cranial nerves grossly intact, moves all 4 extremities.  ?Skin: Warm and dry. ? ? ?Intake/Output from previous day: ?04/05 0701 - 04/06 0700 ?In: 2190.5 [P.O.:480; I.V.:1460.5; IV Piggyback:250] ?Out: 1550 [Urine:1550] ? ? ? ?Lab Results: ?BMET ?   ?Component Value Date/Time  ? NA 136 03/19/2022 0414  ? NA 132 (L) 03/18/2022 0411  ? NA 136 03/17/2022 0357  ? NA 140 03/07/2020 1446  ? NA 140 08/02/2019 1419  ? NA 140 07/13/2019 1218  ? K 4.2 03/19/2022 0414  ? K 4.1 03/18/2022 0411  ? K 4.2 03/17/2022 0357  ? CL 101 03/19/2022 0414  ? CL 98 03/18/2022 0411  ? CL 101 03/17/2022 0357  ? CO2 29 03/19/2022 0414  ? CO2 26 03/18/2022 0411  ? CO2 26 03/17/2022 0357  ? GLUCOSE 111 (H) 03/19/2022 0414  ? GLUCOSE 98 03/18/2022 0411  ? GLUCOSE 91 03/17/2022 0357  ? BUN 11 03/19/2022 0414  ? BUN 15 03/18/2022 0411  ? BUN 13 03/17/2022 0357  ? BUN 19 03/07/2020 1446  ? BUN 15 08/02/2019 1419  ? BUN 14  07/13/2019 1218  ? CREATININE 0.63 03/19/2022 0414  ? CREATININE 0.64 03/18/2022 0411  ? CREATININE 0.64 03/17/2022 0357  ? CREATININE 1.08 04/23/2015 1443  ? CREATININE 1.00 03/15/2015 1542  ? CALCIUM 9.3 03/19/2022 0414  ? CALCIUM 9.1 03/18/2022 0411  ? CALCIUM 9.2 03/17/2022 0357  ? GFRNONAA >60 03/19/2022 0414  ? GFRNONAA >60 03/18/2022 0411  ? GFRNONAA >60 03/17/2022 0357  ? GFRNONAA 83 03/15/2015 1542  ? GFRAA 118 03/07/2020 1446  ? GFRAA 115 08/02/2019 1419  ? GFRAA 123 07/13/2019 1218  ? GFRAA >89 03/15/2015 1542  ? ?CBC ?   ?Component Value Date/Time  ? WBC 12.5 (H) 03/19/2022 0414  ? RBC 4.71 03/19/2022 0414  ? HGB 12.9 (L) 03/19/2022 0414  ? HGB 15.5 11/30/2018 1204  ? HCT 40.6 03/19/2022 0414  ? HCT 45.3 11/30/2018 1204  ? PLT 260 03/19/2022 0414  ? PLT 229 11/30/2018 1204  ? MCV 86.2 03/19/2022 0414  ? MCV 88 11/30/2018 1204  ? MCH 27.4 03/19/2022 0414  ? MCHC 31.8 03/19/2022 0414  ? RDW 14.5 03/19/2022 0414  ? RDW 12.0 (L) 11/30/2018 1204  ? LYMPHSABS 3.0 06/12/2019 1603  ? MONOABS 0.7 06/12/2019 1603  ? EOSABS 0.3 06/12/2019 1603  ? BASOSABS 0.1 06/12/2019 1603  ? ?HEPATIC Function Panel ?No results for input(s): PROT in the last 8760  hours. ? ?Invalid input(s):  ALBUMIN,  AST,  ALT,  ALKPHOS,  BILIDIR,  IBILI ?HEMOGLOBIN A1C ?No components found for: HGA1C,  MPG ?CARDIAC ENZYMES ?Lab Results  ?Component Value Date  ? TROPONINI 0.04 (H) 03/05/2015  ? TROPONINI 0.05 (H) 03/05/2015  ? TROPONINI 0.05 (H) 03/05/2015  ? ?BNP ?No results for input(s): PROBNP in the last 8760 hours. ?TSH ?No results for input(s): TSH in the last 8760 hours. ?CHOLESTEROL ?Recent Labs  ?  03/15/22 ?0408  ?CHOL 165  ? ? ?Scheduled Meds: ? ALPRAZolam  0.25 mg Oral BID  ? aspirin EC  81 mg Oral Daily  ? atorvastatin  80 mg Oral q1800  ? carvedilol  3.125 mg Oral BID WC  ? dofetilide  500 mcg Oral BID  ? fluticasone furoate-vilanterol  1 puff Inhalation Daily  ? And  ? umeclidinium bromide  1 puff Inhalation Daily  ? guaiFENesin   600 mg Oral BID  ? levalbuterol  1.25 mg Nebulization Q6H  ? lisinopril  10 mg Oral Daily  ? loratadine  10 mg Oral Daily  ? magnesium oxide  400 mg Oral QHS  ? montelukast  10 mg Oral QHS  ?  morphine injection  1 mg Intravenous Once  ? pantoprazole  40 mg Oral Daily  ? sertraline  50 mg Oral Daily  ? sodium chloride flush  3 mL Intravenous Q12H  ? sodium chloride flush  3 mL Intravenous Q12H  ? sodium chloride flush  3 mL Intravenous Q12H  ? spironolactone  25 mg Oral Daily  ? ?Continuous Infusions: ? sodium chloride    ? sodium chloride 100 mL/hr at 03/19/22 1741  ? sodium chloride    ? heparin 2,200 Units/hr (03/19/22 1818)  ? ?PRN Meds:.sodium chloride, sodium chloride, acetaminophen, calcium carbonate, diphenhydrAMINE, Muscle Rub, nitroGLYCERIN, ondansetron (ZOFRAN) IV, oxyCODONE, sodium chloride flush, sodium chloride flush, zolpidem ? ?Assessment/Plan: ? Acute coronary syndrome ?Multivessel CAD ?Paroxysmal atrial fibrillation on Tikosyn and Eliquis  ?Morbid obesity ?HTN ? ?Plan: ?CVTS consult. ?Patient understood risk and procedure. ? ? LOS: 5 days  ? ?Time spent including chart review, lab review, examination, discussion with patient/Nurse/Family/PA : 30 min ? ? ?[name]  MD  ?03/19/2022, 7:59 PM ? ? ? ? ?

## 2022-03-19 NOTE — Progress Notes (Signed)
ANTICOAGULATION CONSULT NOTE ? ?Pharmacy Consult for Heparin ?Indication: chest pain/ACS ? ?No Known Allergies ? ?Patient Measurements: ?Height: 6\' 1"  (185.4 cm) ?Weight: (!) 139.8 kg (308 lb 3.3 oz) ?IBW/kg (Calculated) : 79.9 ?Heparin Dosing Weight: 112 kg ? ?Vital Signs: ?Temp: 98.8 ?F (37.1 ?C) (04/05 2057) ?Temp Source: Oral (04/05 2057) ?BP: 119/58 (04/06 0121) ?Pulse Rate: 67 (04/05 2340) ? ?Labs: ?Recent Labs  ?  03/17/22 ?[id number] 03/18/22 ?0411 03/19/22 ?0414  ?HGB 13.6 13.1 12.9*  ?HCT 41.2 40.8 40.6  ?PLT 287 263 260  ?APTT 119*  --   --   ?HEPARINUNFRC 0.63 0.70 0.38  ?CREATININE 0.64 0.64  --   ? ? ? ?Estimated Creatinine Clearance: 137.1 mL/min (by C-G formula based on SCr of 0.64 mg/dL). ? ? ?Assessment: ?66 years old white male with PMH of CHF, obesity, atrial fibrillation on Eliquis PTA has intermittent chest pain with radiation to both shoulders. Cath found 2 vessel disease with inability to place percutaneous stent.  Last Eliquis dose pm on 3/31. Pharmacy consulted for heparin to start after TR band removal.  ? ?Patient was previously at high end of therapeutic goal on 2250 units/hr. H/H 13s, plt stable.  TR band removed around 1900 and no bleeding/hematoma per discussion with RN. ? ?4/6 AM update:  ?Heparin level remains therapeutic ?Getting CVTS consult ? ?Goal of Therapy:  ?Heparin level 0.3-0.7 units/mL ?Monitor platelets by anticoagulation protocol: Yes ?  ?Plan:  ?Cont heparin at 2200 units/hr ?Daily CBC and heparin level ?F/U CVTS consult ? ?Narda Bonds, PharmD, BCPS ?Clinical Pharmacist ?Phone: [phone] ? ? ?

## 2022-03-19 NOTE — Consult Note (Addendum)
? ?   ?[address] ?      [place] [address] ?            [phone]       ? ?Penny Pia ?[facility] Record [id number] ?Date of Birth: Mar 31, 1956 ? ?Referring: Dr. Angelena Form and Dr. Doylene Canard MD ?Primary Care: Andria Frames, PA-C ?Primary Cardiologist:Kenneth Wells Guiles, MD ? ?Chief Complaint:    ?Chief Complaint  ?Patient presents with  ? Chest Pain  ?Reason for consultation:Coronary artery disease ? ?History of Present Illness:     ?This is a 66 year old male with a past medical history of chronic systolic CHF, ETOH and tobacco abuse, hypertension, morbid obesity, persistent a fib, COPD/emphysema, and pre diabetes who has had intermittent chest pain with radiation to both shoulders for the last several weeks. EMS was summonsed and he was given SL NItro and asa. He presented to [facility] ED for further evaluation and treatment. He denies LE edema, syncope, shortness of breath, diaphoresis, or nausea/vomiting. Max Troponin I (high sensitivity) was 164. Echo done 03/14/2022 showed LVEF 55-60%, the left ventricle has no regional wall motion abnormalities, mild to moderate MR, mild calcification of the aortic valve, mild TR, and no pericardial effusion. Cardiac catheterization done 03/18/2022 showed proximal to mid LAD with an 85% stenosis and a proximal RCA lesion that is 90% stenosed. Dr. Angelena Form attempted a PCI of the RCA but this was unsuccessful as RCA not able to be engaged despite using different catheters.  ?Patient and his wife (widowed 3 years ago) moved here several years ago from [place] to help their daughter with grandsons. Patient continues to help take care of his 2 grandsons (now ages 61 and 68) . Even though he uses oxygen at night, he states he is ambulatory and able to do his ADLs. ?Dr. Prescott Gum has been consulted for the consideration of coronary artery bypass grafting surgery. At the time of my exam, patient denies chest pain or shortness of breath. ? ?Current  Activity/ Functional Status: ?Patient is independent with mobility/ambulation, transfers, ADL's, IADL's. ?  ?Zubrod Score: ?At the time of surgery this patient?s most appropriate activity status/level should be described as: ?[]     0    Normal activity, no symptoms ?[x]     1    Restricted in physical strenuous activity but ambulatory, able to do out light work ?[]     2    Ambulatory and capable of self care, unable to do work activities, up and about more than 50% of the time                            ?[]     3    Only limited self care, in bed greater than 50% of waking hours ?[]     4    Completely disabled, no self care, confined to bed or chair ?[]     5    Moribund ? ?Past Medical History:  ?Diagnosis Date  ? Chronic systolic CHF (congestive heart failure) ([place])   ? a. 2D ECHO (03/06/15) EF 20-25%, diffuse HK. Asc aortic diameter: 32mm. Mild LA dilation, mild RV dilation. Mild RV systolic dysfunction   b. LifeVest placed on 02/2015 admissoin   ? Coronary artery disease   ? a. 70% LAD lesion  ? Elevated TSH   ? ETOH abuse   ? Hypertension   ? Morbid obesity ([place])   ? a. BMI ~  46  ? Persistent atrial fibrillation ([place])   ? a. newly dx 02/2015 admission. s/p successful TEE/DCCV  b. on Eliquis  ? Pre-diabetes   ? a. HgA1c 6.1  ? ? ?Past Surgical History:  ?Procedure Laterality Date  ? CARDIAC CATHETERIZATION N/A 04/26/2015  ? Procedure: Left Heart Cath and Coronary Angiography;  Surgeon: Belva Crome, MD;  Location: [facility];  Service: Cardiovascular;  Laterality: N/A;  ? CARDIOVERSION N/A 03/06/2015  ? Procedure: CARDIOVERSION;  Surgeon: Larey Dresser, MD;  Location: [facility];  Service: Cardiovascular;  Laterality: N/A;  ? CORONARY ANGIOGRAPHY N/A 03/18/2022  ? Procedure: CORONARY ANGIOGRAPHY;  Surgeon: Burnell Blanks, MD;  Location: [facility];  Service: Cardiovascular;  Laterality: N/A;  ? HERNIA REPAIR    ? LEFT HEART CATH N/A 03/16/2022  ? Procedure: Left Heart Cath;  Surgeon: Dixie Dials, MD;  Location: [facility];  Service: Cardiovascular;  Laterality: N/A;  ? TEE WITHOUT CARDIOVERSION N/A 03/06/2015  ? Procedure: TRANSESOPHAGEAL ECHOCARDIOGRAM (TEE);  Surgeon: Larey Dresser, MD;  Location: [facility];  Service: Cardiovascular;  Laterality: N/A;  ? TEE WITHOUT CARDIOVERSION N/A 05/02/2015  ? Procedure: TRANSESOPHAGEAL ECHOCARDIOGRAM (TEE);  Surgeon: Lelon Perla, MD;  Location: [facility];  Service: Cardiovascular;  Laterality: N/A;  ? ? ?Social History  ? ?Tobacco Use  ?Smoking Status Former  ? Types: Cigarettes  ? Quit date: 01/02/2013  ? Years since quitting: 9.2  ?Smokeless Tobacco Never  ?Tobacco Comments  ? smokes about a week  ?  ?Social History  ? ?Substance and Sexual Activity  ?Alcohol Use Yes  ? Alcohol/week: 0.0 standard drinks  ? ? ?Allergies: ?No Known Allergies ? ?Current Facility-Administered Medications  ?Medication Dose Route Frequency Provider Last Rate Last Admin  ? 0.9 %  sodium chloride infusion  250 mL Intravenous PRN Burnell Blanks, MD      ? 0.9 %  sodium chloride infusion   Intravenous Continuous Burnell Blanks, MD 100 mL/hr at 03/19/22 0658 New Bag at 03/19/22 [id number]  ? 0.9 %  sodium chloride infusion  250 mL Intravenous PRN Burnell Blanks, MD      ? acetaminophen (TYLENOL) tablet 650 mg  650 mg Oral Q4H PRN Burnell Blanks, MD   650 mg at 03/16/22 2047  ? ALPRAZolam Duanne Moron) tablet 0.25 mg  0.25 mg Oral BID Burnell Blanks, MD   0.25 mg at 03/18/22 [id number]  ? aspirin EC tablet 81 mg  81 mg Oral Daily Burnell Blanks, MD   81 mg at 03/19/22 [id number]  ? atorvastatin (LIPITOR) tablet 80 mg  80 mg Oral q1800 Burnell Blanks, MD   80 mg at 03/18/22 1748  ? calcium carbonate (TUMS - dosed in mg elemental calcium) chewable tablet 600 mg of elemental calcium  3 tablet Oral BID PRN Burnell Blanks, MD      ? carvedilol (COREG) tablet 3.125 mg  3.125 mg Oral BID WC Dixie Dials, MD   3.125 mg at 03/19/22  [id number]  ? diphenhydrAMINE (BENADRYL) capsule 25 mg  25 mg Oral QHS PRN Burnell Blanks, MD   25 mg at 03/16/22 0211  ? dofetilide (TIKOSYN) capsule 500 mcg  500 mcg Oral BID Burnell Blanks, MD   500 mcg at 03/19/22 [id number]  ? fluticasone furoate-vilanterol (BREO ELLIPTA) 100-25 MCG/ACT 1 puff  1 puff Inhalation Daily Burnell Blanks, MD   1 puff at 03/19/22 0744  ? And  ?  umeclidinium bromide (INCRUSE ELLIPTA) 62.5 MCG/ACT 1 puff  1 puff Inhalation Daily Burnell Blanks, MD   1 puff at 03/19/22 0744  ? heparin ADULT infusion 100 units/mL (25000 units/232mL)  2,200 Units/hr Intravenous Continuous Tyrone Apple, RPH 22 mL/hr at 03/19/22 1001 2,200 Units/hr at 03/19/22 1001  ? lisinopril (ZESTRIL) tablet 10 mg  10 mg Oral Daily Burnell Blanks, MD   10 mg at 03/19/22 [id number]  ? loratadine (CLARITIN) tablet 10 mg  10 mg Oral Daily Burnell Blanks, MD   10 mg at 03/19/22 [id number]  ? magnesium oxide (MAG-OX) tablet 400 mg  400 mg Oral QHS Burnell Blanks, MD   400 mg at 03/18/22 2130  ? montelukast (SINGULAIR) tablet 10 mg  10 mg Oral QHS Burnell Blanks, MD   10 mg at 03/18/22 2130  ? morphine (PF) 2 MG/ML injection 1 mg  1 mg Intravenous Once Dixie Dials, MD      ? Muscle Rub CREA   Topical PRN Burnell Blanks, MD   1 application. at 03/15/22 0203  ? nitroGLYCERIN (NITROSTAT) SL tablet 0.4 mg  0.4 mg Sublingual Q5 Min x 3 PRN Burnell Blanks, MD   0.4 mg at 03/18/22 0009  ? ondansetron (ZOFRAN) injection 4 mg  4 mg Intravenous Q6H PRN Burnell Blanks, MD   4 mg at 03/18/22 1021  ? oxyCODONE (Oxy IR/ROXICODONE) immediate release tablet 5 mg  5 mg Oral Q6H PRN Dixie Dials, MD      ? pantoprazole (PROTONIX) EC tablet 40 mg  40 mg Oral Daily Burnell Blanks, MD   40 mg at 03/19/22 [id number]  ? sertraline (ZOLOFT) tablet 50 mg  50 mg Oral Daily Burnell Blanks, MD   50 mg at 03/19/22 [id number]  ? sodium chloride flush (NS) 0.9 % injection 3  mL  3 mL Intravenous Q12H Burnell Blanks, MD   3 mL at 03/18/22 2131  ? sodium chloride flush (NS) 0.9 % injection 3 mL  3 mL Intravenous Q12H Burnell Blanks, MD   3 mL at 03/18/22 21

## 2022-03-19 NOTE — Progress Notes (Signed)
Pt had another episode of chest pain scaled 8/10 and radiates to bilateral arms. It lasted for about 8 mins. Pt refused pain meds and EKG stating pain is already diminishing. MD informed. Will continue to monitor pt.  ?

## 2022-03-20 ENCOUNTER — Inpatient Hospital Stay (HOSPITAL_COMMUNITY): Payer: Medicare (Managed Care)

## 2022-03-20 ENCOUNTER — Encounter (HOSPITAL_COMMUNITY): Payer: Medicare (Managed Care)

## 2022-03-20 DIAGNOSIS — Z01810 Encounter for preprocedural cardiovascular examination: Secondary | ICD-10-CM | POA: Diagnosis not present

## 2022-03-20 DIAGNOSIS — I2510 Atherosclerotic heart disease of native coronary artery without angina pectoris: Secondary | ICD-10-CM | POA: Diagnosis not present

## 2022-03-20 LAB — CBC
HCT: 40.6 % (ref 39.0–52.0)
Hemoglobin: 12.7 g/dL — ABNORMAL LOW (ref 13.0–17.0)
MCH: 27 pg (ref 26.0–34.0)
MCHC: 31.3 g/dL (ref 30.0–36.0)
MCV: 86.4 fL (ref 80.0–100.0)
Platelets: 236 10*3/uL (ref 150–400)
RBC: 4.7 MIL/uL (ref 4.22–5.81)
RDW: 14.6 % (ref 11.5–15.5)
WBC: 9.4 10*3/uL (ref 4.0–10.5)
nRBC: 0 % (ref 0.0–0.2)

## 2022-03-20 LAB — BLOOD GAS, ARTERIAL
Acid-Base Excess: 3.5 mmol/L — ABNORMAL HIGH (ref 0.0–2.0)
Bicarbonate: 29.6 mmol/L — ABNORMAL HIGH (ref 20.0–28.0)
Drawn by: 417123
O2 Saturation: 95.7 %
Patient temperature: 37.2
pCO2 arterial: 50 mmHg — ABNORMAL HIGH (ref 32–48)
pH, Arterial: 7.38 (ref 7.35–7.45)
pO2, Arterial: 86 mmHg (ref 83–108)

## 2022-03-20 LAB — MAGNESIUM: Magnesium: 1.6 mg/dL — ABNORMAL LOW (ref 1.7–2.4)

## 2022-03-20 LAB — PULMONARY FUNCTION TEST
FEF 25-75 Pre: 1.56 L/sec
FEF2575-%Pred-Pre: 52 %
FEV1-%Pred-Pre: 64 %
FEV1-Pre: 2.41 L
FEV1FVC-%Pred-Pre: 81 %
FEV6-%Pred-Pre: 79 %
FEV6-Pre: 3.77 L
FEV6FVC-%Pred-Pre: 100 %
FVC-%Pred-Pre: 79 %
FVC-Pre: 3.95 L
Pre FEV1/FVC ratio: 61 %
Pre FEV6/FVC Ratio: 96 %

## 2022-03-20 LAB — HEPATIC FUNCTION PANEL
ALT: 29 U/L (ref 0–44)
AST: 25 U/L (ref 15–41)
Albumin: 3.1 g/dL — ABNORMAL LOW (ref 3.5–5.0)
Alkaline Phosphatase: 59 U/L (ref 38–126)
Bilirubin, Direct: 0.1 mg/dL (ref 0.0–0.2)
Indirect Bilirubin: 0.7 mg/dL (ref 0.3–0.9)
Total Bilirubin: 0.8 mg/dL (ref 0.3–1.2)
Total Protein: 6.1 g/dL — ABNORMAL LOW (ref 6.5–8.1)

## 2022-03-20 LAB — HEPARIN LEVEL (UNFRACTIONATED): Heparin Unfractionated: 0.58 IU/mL (ref 0.30–0.70)

## 2022-03-20 LAB — URINALYSIS, COMPLETE (UACMP) WITH MICROSCOPIC
Bacteria, UA: NONE SEEN
Bilirubin Urine: NEGATIVE
Glucose, UA: NEGATIVE mg/dL
Hgb urine dipstick: NEGATIVE
Ketones, ur: NEGATIVE mg/dL
Leukocytes,Ua: NEGATIVE
Nitrite: NEGATIVE
Protein, ur: NEGATIVE mg/dL
Specific Gravity, Urine: 1.004 — ABNORMAL LOW (ref 1.005–1.030)
pH: 6 (ref 5.0–8.0)

## 2022-03-20 LAB — BASIC METABOLIC PANEL
Anion gap: 8 (ref 5–15)
BUN: 8 mg/dL (ref 8–23)
CO2: 26 mmol/L (ref 22–32)
Calcium: 9.2 mg/dL (ref 8.9–10.3)
Chloride: 104 mmol/L (ref 98–111)
Creatinine, Ser: 0.64 mg/dL (ref 0.61–1.24)
GFR, Estimated: >60 mL/min (ref >60)
Glucose, Bld: 107 mg/dL — ABNORMAL HIGH (ref 70–99)
Potassium: 4 mmol/L (ref 3.5–5.1)
Sodium: 138 mmol/L (ref 135–145)

## 2022-03-20 LAB — SURGICAL PCR SCREEN
MRSA, PCR: POSITIVE — AB
Staphylococcus aureus: POSITIVE — AB

## 2022-03-20 LAB — TSH: TSH: 2.492 u[IU]/mL (ref 0.350–4.500)

## 2022-03-20 MED ORDER — CHLORHEXIDINE GLUCONATE CLOTH 2 % EX PADS
6.0000 | MEDICATED_PAD | Freq: Every day | CUTANEOUS | Status: AC
  Administered 2022-03-20 – 2022-03-24 (×5): 6 via TOPICAL

## 2022-03-20 MED ORDER — MAGNESIUM SULFATE 4 GM/100ML IV SOLN
4.0000 g | Freq: Once | INTRAVENOUS | Status: AC
  Administered 2022-03-20: 4 g via INTRAVENOUS
  Filled 2022-03-20: qty 100

## 2022-03-20 MED ORDER — MUPIROCIN 2 % EX OINT
1.0000 "application " | TOPICAL_OINTMENT | Freq: Two times a day (BID) | CUTANEOUS | Status: DC
  Administered 2022-03-20 – 2022-03-23 (×8): 1 via NASAL
  Filled 2022-03-20: qty 22

## 2022-03-20 MED ORDER — MAGNESIUM SULFATE IN D5W 1-5 GM/100ML-% IV SOLN
1.0000 g | Freq: Once | INTRAVENOUS | Status: DC
Start: 2022-03-20 — End: 2022-03-20
  Filled 2022-03-20: qty 100

## 2022-03-20 MED ORDER — MUPIROCIN 2 % EX OINT: 1.0000 "application " | TOPICAL_OINTMENT | Freq: Two times a day (BID) | CUTANEOUS | Status: DC

## 2022-03-20 MED ORDER — LEVALBUTEROL HCL 1.25 MG/0.5ML IN NEBU
1.2500 mg | INHALATION_SOLUTION | Freq: Four times a day (QID) | RESPIRATORY_TRACT | Status: DC | PRN
  Filled 2022-03-20: qty 0.5

## 2022-03-20 MED FILL — Nitroglycerin IV Soln 100 MCG/ML in D5W: INTRA_ARTERIAL | Qty: 10 | Status: AC

## 2022-03-20 NOTE — Progress Notes (Signed)
Pre-CABG and lower extremity saphenous mapping study completed.   Please see CV Proc for preliminary results.   Aquil Duhe, RDMS, RVT  

## 2022-03-20 NOTE — Progress Notes (Signed)
Ref: Andria Frames, PA-C ? ? ?Subjective:  ?Awake. ?Awaiting vein mapping and other tests for CABG.  ?VS stable. ? ?Objective:  ?Vital Signs in the last 24 hours: ?Temp:  [97.6 ?F (36.4 ?C)-98.6 ?F (37 ?C)] 98.6 ?F (37 ?C) (04/07 2035) ?Pulse Rate:  [64-72] 65 (04/07 2035) ?Cardiac Rhythm: Heart block (04/07 1935) ?Resp:  [16-18] 16 (04/07 2035) ?BP: (108-149)/(54-90) 118/65 (04/07 2035) ?SpO2:  [91 %-97 %] 97 % (04/07 2035) ?Weight:  [140.5 kg] 140.5 kg (04/07 0505) ? ?Physical Exam: ?BP Readings from Last 1 Encounters:  ?03/20/22 118/65  ?   ?Wt Readings from Last 1 Encounters:  ?03/20/22 (!) 140.5 kg  ?  Weight change: -1.776 kg Body mass index is 40.87 kg/m?. ?HEENT: [place]/AT, Eyes-Blue, Conjunctiva-Pink, Sclera-Non-icteric ?Neck: No JVD, No bruit, Trachea midline. ?Lungs:  Clear, Bilateral. ?Cardiac:  Regular rhythm, normal S1 and S2, no S3. II/VI systolic murmur. ?Abdomen:  Soft, non-tender. BS present. ?Extremities:  No edema present. No cyanosis. No clubbing. ?CNS: AxOx3, Cranial nerves grossly intact, moves all 4 extremities.  ?Skin: Warm and dry. ? ? ?Intake/Output from previous day: ?04/06 0701 - 04/07 0700 ?In: [id number] [P.O.:240; I.V.:1338] ?Out: 2450 [Urine:2450] ? ? ? ?Lab Results: ?BMET ?   ?Component Value Date/Time  ? NA 138 03/20/2022 0732  ? NA 136 03/19/2022 0414  ? NA 132 (L) 03/18/2022 0411  ? NA 140 03/07/2020 1446  ? NA 140 08/02/2019 1419  ? NA 140 07/13/2019 1218  ? K 4.0 03/20/2022 0732  ? K 4.2 03/19/2022 0414  ? K 4.1 03/18/2022 0411  ? CL 104 03/20/2022 0732  ? CL 101 03/19/2022 0414  ? CL 98 03/18/2022 0411  ? CO2 26 03/20/2022 0732  ? CO2 29 03/19/2022 0414  ? CO2 26 03/18/2022 0411  ? GLUCOSE 107 (H) 03/20/2022 0732  ? GLUCOSE 111 (H) 03/19/2022 0414  ? GLUCOSE 98 03/18/2022 0411  ? BUN 8 03/20/2022 0732  ? BUN 11 03/19/2022 0414  ? BUN 15 03/18/2022 0411  ? BUN 19 03/07/2020 1446  ? BUN 15 08/02/2019 1419  ? BUN 14 07/13/2019 1218  ? CREATININE 0.64 03/20/2022 0732  ? CREATININE 0.63  03/19/2022 0414  ? CREATININE 0.64 03/18/2022 0411  ? CREATININE 1.08 04/23/2015 1443  ? CREATININE 1.00 03/15/2015 1542  ? CALCIUM 9.2 03/20/2022 0732  ? CALCIUM 9.3 03/19/2022 0414  ? CALCIUM 9.1 03/18/2022 0411  ? GFRNONAA >60 03/20/2022 0732  ? GFRNONAA >60 03/19/2022 0414  ? GFRNONAA >60 03/18/2022 0411  ? GFRNONAA 83 03/15/2015 1542  ? GFRAA 118 03/07/2020 1446  ? GFRAA 115 08/02/2019 1419  ? GFRAA 123 07/13/2019 1218  ? GFRAA >89 03/15/2015 1542  ? ?CBC ?   ?Component Value Date/Time  ? WBC 9.4 03/20/2022 0732  ? RBC 4.70 03/20/2022 0732  ? HGB 12.7 (L) 03/20/2022 0732  ? HGB 15.5 11/30/2018 1204  ? HCT 40.6 03/20/2022 0732  ? HCT 45.3 11/30/2018 1204  ? PLT 236 03/20/2022 0732  ? PLT 229 11/30/2018 1204  ? MCV 86.4 03/20/2022 0732  ? MCV 88 11/30/2018 1204  ? MCH 27.0 03/20/2022 0732  ? MCHC 31.3 03/20/2022 0732  ? RDW 14.6 03/20/2022 0732  ? RDW 12.0 (L) 11/30/2018 1204  ? LYMPHSABS 3.0 06/12/2019 1603  ? MONOABS 0.7 06/12/2019 1603  ? EOSABS 0.3 06/12/2019 1603  ? BASOSABS 0.1 06/12/2019 1603  ? ?HEPATIC Function Panel ?Recent Labs  ?  03/20/22 ?0732  ?PROT 6.1*  ? ?HEMOGLOBIN A1C ?No components  found for: HGA1C,  MPG ?CARDIAC ENZYMES ?Lab Results  ?Component Value Date  ? TROPONINI 0.04 (H) 03/05/2015  ? TROPONINI 0.05 (H) 03/05/2015  ? TROPONINI 0.05 (H) 03/05/2015  ? ?BNP ?No results for input(s): PROBNP in the last 8760 hours. ?TSH ?Recent Labs  ?  03/20/22 ?0732  ?TSH 2.492  ? ?CHOLESTEROL ?Recent Labs  ?  03/15/22 ?0408  ?CHOL 165  ? ? ?Scheduled Meds: ? ALPRAZolam  0.25 mg Oral BID  ? aspirin EC  81 mg Oral Daily  ? atorvastatin  80 mg Oral q1800  ? carvedilol  3.125 mg Oral BID WC  ? Chlorhexidine Gluconate Cloth  6 each Topical Q0600  ? dofetilide  500 mcg Oral BID  ? fluticasone furoate-vilanterol  1 puff Inhalation Daily  ? And  ? umeclidinium bromide  1 puff Inhalation Daily  ? guaiFENesin  600 mg Oral BID  ? lisinopril  10 mg Oral Daily  ? loratadine  10 mg Oral Daily  ? magnesium oxide  400  mg Oral QHS  ? montelukast  10 mg Oral QHS  ?  morphine injection  1 mg Intravenous Once  ? mupirocin ointment  1 application. Nasal BID  ? pantoprazole  40 mg Oral Daily  ? sertraline  50 mg Oral Daily  ? sodium chloride flush  3 mL Intravenous Q12H  ? sodium chloride flush  3 mL Intravenous Q12H  ? sodium chloride flush  3 mL Intravenous Q12H  ? spironolactone  25 mg Oral Daily  ? ?Continuous Infusions: ? sodium chloride    ? sodium chloride 100 mL/hr at 03/20/22 1413  ? sodium chloride    ? heparin 2,200 Units/hr (03/20/22 2220)  ? ?PRN Meds:.sodium chloride, sodium chloride, acetaminophen, calcium carbonate, diphenhydrAMINE, levalbuterol, magnesium hydroxide, Muscle Rub, nitroGLYCERIN, ondansetron (ZOFRAN) IV, oxyCODONE, sodium chloride flush, sodium chloride flush, zolpidem ? ?Assessment/Plan: ? Acute coronary syndrome ?Multivessel CAD ?HTN ?Morbid obesity ?Paroxysmal atrial fibrillation ? ?Plan: ?Awaiting CABG. ? ? LOS: 6 days  ? ?Time spent including chart review, lab review, examination, discussion with patient.Nurse : 30 min ? ? ?Dixie Dials  MD  ?03/20/2022, 10:33 PM ? ? ? ? ?

## 2022-03-20 NOTE — Progress Notes (Signed)
Discussed with pt IS (2250 first, 1500 second attempt), sternal precautions, mobility post op, and d/c planning. Receptive. He sts he has been taking short walks on his own. He admits that sternal precautions will be difficult but he will start practicing. (Vascular here now, will practice with him later) [employer]. Pt sts his plan is to go to daughters house or SNF at d/c. Informed him that insurance typically doesn't cover SNF unless that is a significant set back in his functionability. Will f/u. He sts his CP always occurs when he gets up in the night for the BR. ?[id number] ?Ethelda Chick CES, ACSM ?10:02 AM ?03/20/2022 ? ?

## 2022-03-20 NOTE — Progress Notes (Signed)
Pt suddenly woke up complaining of sharp chest pain radiating to his left shoulder. VS and 12 L EKG done. Nitroglycerin 0.4 mg SL given with relief. Will continue to monitor pt.  ? 03/20/22 0044  ?Vitals  ?BP (!) 147/82  ?MAP (mmHg) 98  ?BP Location Right Arm  ?BP Method Automatic  ?Patient Position (if appropriate) Lying  ?ECG Heart Rate 74  ?Resp 18  ?MEWS COLOR  ?MEWS Score Color Green  ?EKG   ?EKG performed? Hand deliver to RN or MD  ?Oxygen Therapy  ?SpO2 93 %  ?O2 Device Nasal Cannula  ?O2 Flow Rate (L/min) 2 L/min  ?Pain Assessment  ?Pain Scale 0-10  ?Pain Score 7  ?Faces Pain Scale 6  ?Pain Type Acute pain  ?Pain Location Chest  ?Pain Orientation Left  ?Pain Radiating Towards L shoulder  ?Pain Descriptors / Indicators Sharp  ?Pain Frequency Intermittent  ?Pain Onset Sudden  ?Patients Stated Pain Goal 0  ?Pain Intervention(s) Medication (See eMAR)  ?Multiple Pain Sites No  ?MEWS Score  ?MEWS Temp 0  ?MEWS Systolic 0  ?MEWS Pulse 0  ?MEWS RR 0  ?MEWS LOC 0  ?MEWS Score 0  ? ? ?

## 2022-03-20 NOTE — Progress Notes (Signed)
2 Days Post-Op Procedure(s) (LRB): ?CORONARY ANGIOGRAPHY (N/A) ?Subjective: ?Patient had some chest pain last night when he got up to use the bathroom relieved by 1 nitroglycerin sublingual ? ?Pre-CABG Dopplers showed no significant carotid disease.  Negative Allen's test left hand.  Right leg saphenous vein appears to be adequate although somewhat enlarged proximally. ? ?He is receiving Bactroban with nasal application for positive MRSA screen ? ?Chest CT scan without contrast shows no aortic aneurysm, no at risk pulmonary nodules or adenopathy ? ?Bedside PFTs show adequate vital capacity and FEV1.  ABGs show baseline PCO2 of 50 consistent with history of obesity sleep apnea ? ?Continue IV heparin, as needed nitroglycerin and plan multivessel CABG on Tuesday a.m. April 11. ? ?Objective: ?Vital signs in last 24 hours: ?Temp:  [97.6 ?F (36.4 ?C)-98.9 ?F (37.2 ?C)] 97.8 ?F (36.6 ?C) (04/07 1414) ?Pulse Rate:  [64-72] 64 (04/07 1414) ?Cardiac Rhythm: Sinus bradycardia;Bundle branch block (04/07 0803) ?Resp:  [16-18] 18 (04/07 1414) ?BP: (108-149)/(54-90) 108/59 (04/07 1414) ?SpO2:  [91 %-96 %] 92 % (04/07 1414) ?Weight:  [140.5 kg] 140.5 kg (04/07 0505) ? ?Hemodynamic parameters for last 24 hours: ?Sinus rhythm ? ?Intake/Output from previous day: ?04/06 0701 - 04/07 0700 ?In: 1578 [P.O.:240; I.V.:1338] ?Out: 2450 [Urine:2450] ?Intake/Output this shift: ?Total I/O ?In: 2445.8 [I.V.:2357; IV Piggyback:88.8] ?Out: -  ? ?Neuro intact ?Lungs clear ?No bleeding from cath site ? ?Lab Results: ?Recent Labs  ?  03/19/22 ?0414 03/20/22 ?0732  ?WBC 12.5* 9.4  ?HGB 12.9* 12.7*  ?HCT 40.6 40.6  ?PLT 260 236  ? ?BMET:  ?Recent Labs  ?  03/19/22 ?0414 03/20/22 ?0732  ?NA 136 138  ?K 4.2 4.0  ?CL 101 104  ?CO2 29 26  ?GLUCOSE 111* 107*  ?BUN 11 8  ?CREATININE 0.63 0.64  ?CALCIUM 9.3 9.2  ?  ?PT/INR: No results for input(s): LABPROT, INR in the last 72 hours. ?ABG ?   ?Component Value Date/Time  ? PHART 7.38 03/20/2022 0433  ? HCO3  29.6 (H) 03/20/2022 0433  ? TCO2 24 04/06/2015 1803  ? O2SAT 95.7 03/20/2022 0433  ? ?CBG (last 3)  ?No results for input(s): GLUCAP in the last 72 hours. ? ?Assessment/Plan: ?S/P Procedure(s) (LRB): ?CORONARY ANGIOGRAPHY (N/A) ?Plan multivessel CABG next week after Brilinta washout ? ? LOS: 6 days  ? ? ?Lovett Sox ?03/20/2022 ?  ?

## 2022-03-20 NOTE — Progress Notes (Signed)
ANTICOAGULATION CONSULT NOTE ? ?Pharmacy Consult for Heparin ?Indication: chest pain/ACS ? ?No Known Allergies ? ?Patient Measurements: ?Height: 6\' 1"  (185.4 cm) ?Weight: (!) 140.5 kg (309 lb 12.8 oz) ?IBW/kg (Calculated) : 79.9 ?Heparin Dosing Weight: 112 kg ? ?Vital Signs: ?Temp: 97.6 ?F (36.4 ?C) (04/07 0505) ?Temp Source: Oral (04/07 0505) ?BP: 139/80 (04/07 0834) ?Pulse Rate: 72 (04/07 0505) ? ?Labs: ?Recent Labs  ?  03/18/22 ?0411 03/19/22 ?0414 03/20/22 ?0732  ?HGB 13.1 12.9* 12.7*  ?HCT 40.8 40.6 40.6  ?PLT 263 260 236  ?HEPARINUNFRC 0.70 0.38 0.58  ?CREATININE 0.64 0.63 0.64  ? ? ? ?Estimated Creatinine Clearance: 137.4 mL/min (by C-G formula based on SCr of 0.64 mg/dL). ? ? ?Assessment: ?66 years old white male with PMH of CHF, obesity, atrial fibrillation on Eliquis PTA has intermittent chest pain with radiation to both shoulders. Cath found 2 vessel disease with inability to place percutaneous stent.  Last Eliquis dose pm on 3/31. Pharmacy consulted for heparin to start after TR band removal.  ? ?Heparin level within goal range this AM.  No overt bleeding or complications noted.  Awaiting CABG consult. ? ? ?Goal of Therapy:  ?Heparin level 0.3-0.7 units/mL ?Monitor platelets by anticoagulation protocol: Yes ?  ?Plan:  ?Cont heparin at 2200 units/hr ?Daily CBC and heparin level ?F/U CVTS consult ? ?4/31, Pharm D, BCPS, BCCP ?Clinical Pharmacist ? 03/20/2022 10:42 AM  ? ?[facility] pharmacy phone numbers are listed on [email or web address] ? ? ? ?

## 2022-03-21 LAB — BASIC METABOLIC PANEL
Anion gap: 8 (ref 5–15)
BUN: 10 mg/dL (ref 8–23)
CO2: 24 mmol/L (ref 22–32)
Calcium: 8.9 mg/dL (ref 8.9–10.3)
Chloride: 103 mmol/L (ref 98–111)
Creatinine, Ser: 0.66 mg/dL (ref 0.61–1.24)
GFR, Estimated: >60 mL/min (ref >60)
Glucose, Bld: 104 mg/dL — ABNORMAL HIGH (ref 70–99)
Potassium: 4.2 mmol/L (ref 3.5–5.1)
Sodium: 135 mmol/L (ref 135–145)

## 2022-03-21 LAB — CBC
HCT: 40.2 % (ref 39.0–52.0)
Hemoglobin: 13.2 g/dL (ref 13.0–17.0)
MCH: 28.2 pg (ref 26.0–34.0)
MCHC: 32.8 g/dL (ref 30.0–36.0)
MCV: 85.9 fL (ref 80.0–100.0)
Platelets: 219 10*3/uL (ref 150–400)
RBC: 4.68 MIL/uL (ref 4.22–5.81)
RDW: 14.6 % (ref 11.5–15.5)
WBC: 12.2 10*3/uL — ABNORMAL HIGH (ref 4.0–10.5)
nRBC: 0 % (ref 0.0–0.2)

## 2022-03-21 LAB — MAGNESIUM: Magnesium: 1.8 mg/dL (ref 1.7–2.4)

## 2022-03-21 LAB — HEPARIN LEVEL (UNFRACTIONATED): Heparin Unfractionated: 0.47 IU/mL (ref 0.30–0.70)

## 2022-03-21 MED ORDER — PSYLLIUM 95 % PO PACK
1.0000 | PACK | Freq: Every day | ORAL | Status: DC
  Administered 2022-03-21 – 2022-03-23 (×2): 1 via ORAL
  Filled 2022-03-21 (×4): qty 1

## 2022-03-21 MED ORDER — MAGNESIUM SULFATE 2 GM/50ML IV SOLN
2.0000 g | Freq: Once | INTRAVENOUS | Status: AC
  Administered 2022-03-21: 2 g via INTRAVENOUS
  Filled 2022-03-21: qty 50

## 2022-03-21 NOTE — Progress Notes (Signed)
CARDIAC REHAB PHASE I  ? ?PRE:  Rate/Rhythm: SR 64 ? ?  BP: sitting 132/79 ? ?  SaO2: 91 RA ? ?MODE:  Ambulation: 450 ft  ? ?POST:  Rate/Rhythm: 68 SR ? ?  BP: sitting 133/64  ? ?  SaO2: 93 RA ? ?[id number] ?Patient sitting up in chair upon arrival. Sternal precautions practiced including pillow utilization for sternum stabilization. Practiced getting in and out of bed and sitting and standing without using arms. Reinforced "moving in the tube". Patient ambulated 450 ft independently with slow steady gait. Denied chest pain or pressure. Post ambulation patient returned to chair with call bell and phone in reach. Will follow continue to follow.   ? ?Willadean Carol RN, BSN ? ?

## 2022-03-21 NOTE — Progress Notes (Signed)
Subjective:  ?Patient denies any chest pain or shortness of breath.  Up in chair scheduled for CABG on Tuesday ? ?Objective:  ?Vital Signs in the last 24 hours: ?Temp:  [97.8 ?F (36.6 ?C)-98.6 ?F (37 ?C)] 98.2 ?F (36.8 ?C) (04/08 0536) ?Pulse Rate:  [64-74] 74 (04/08 0536) ?Resp:  [15-18] 15 (04/08 0536) ?BP: (105-133)/(56-65) 133/64 (04/08 0913) ?SpO2:  [92 %-97 %] 97 % (04/08 0850) ?FiO2 (%):  [21 %] 21 % (04/08 0850) ?Weight:  [142.9 kg] 142.9 kg (04/08 0536) ? ?Intake/Output from previous day: ?04/07 0701 - 04/08 0700 ?In: 2445.8 [I.V.:2357; IV Piggyback:88.8] ?Out: 1500 [Urine:1500] ?Intake/Output from this shift: ?Total I/O ?In: 2001.5 [I.V.:2001.5] ?Out: -  ? ?Physical Exam: ?Neck: no adenopathy, no carotid bruit, no JVD, and supple, symmetrical, trachea midline ?Lungs: clear to auscultation bilaterally ?Heart: regularly irregular rhythm, S1, S2 normal, and 2/6 systolic murmur noted ?Abdomen: soft, non-tender; bowel sounds normal; no masses,  no organomegaly ?Extremities: extremities normal, atraumatic, no cyanosis or edema ? ?Lab Results: ?Recent Labs  ?  03/20/22 ?0732 03/21/22 ?0230  ?WBC 9.4 12.2*  ?HGB 12.7* 13.2  ?PLT 236 219  ? ?Recent Labs  ?  03/20/22 ?0732 03/21/22 ?0230  ?NA 138 135  ?K 4.0 4.2  ?CL 104 103  ?CO2 26 24  ?GLUCOSE 107* 104*  ?BUN 8 10  ?CREATININE 0.64 0.66  ? ?No results for input(s): TROPONINI in the last 72 hours. ? ?Invalid input(s): CK, MB ?Hepatic Function Panel ?Recent Labs  ?  03/20/22 ?0732  ?PROT 6.1*  ?ALBUMIN 3.1*  ?AST 25  ?ALT 29  ?ALKPHOS 59  ?BILITOT 0.8  ?BILIDIR 0.1  ?IBILI 0.7  ? ?No results for input(s): CHOL in the last 72 hours. ?No results for input(s): PROTIME in the last 72 hours. ? ?Imaging: ?Imaging results have been reviewed and CT chest w/o contrast ? ?Result Date: 03/20/2022 ?CLINICAL DATA:  Chest pain. EXAM: CT CHEST WITHOUT CONTRAST TECHNIQUE: Multidetector CT imaging of the chest was performed following the standard protocol without IV contrast.  RADIATION DOSE REDUCTION: This exam was performed according to the departmental dose-optimization program which includes automated exposure control, adjustment of the mA and/or kV according to patient size and/or use of iterative reconstruction technique. COMPARISON:  August 11, 2019. FINDINGS: Cardiovascular: Atherosclerosis of thoracic aorta is noted without aneurysm formation. Normal cardiac size. No pericardial effusion. Extensive coronary artery calcifications are noted. Mediastinum/Nodes: No enlarged mediastinal or axillary lymph nodes. Thyroid gland, trachea, and esophagus demonstrate no significant findings. Lungs/Pleura: No pneumothorax or pleural effusion is noted. Elevated left hemidiaphragm is noted with left basilar subsegmental atelectasis. Mild right posterior basilar subsegmental atelectasis is noted. Upper Abdomen: No acute abnormality. Musculoskeletal: No chest wall mass or suspicious bone lesions identified. IMPRESSION: Elevated left hemidiaphragm is noted with mild left basilar subsegmental atelectasis. Mild right posterior basilar subsegmental atelectasis is noted as well. Extensive coronary artery calcifications are noted suggesting coronary artery disease. Aortic Atherosclerosis (ICD10-I70.0). Electronically Signed   By: Lupita Raider M.D.   On: 03/20/2022 12:49  ? ?VAS [place] LOWER EXTREMITY SAPHENOUS VEIN MAPPING ? ?Result Date: 03/20/2022 ?LOWER EXTREMITY VEIN MAPPING Patient Name:  Arthur Church  Date of Exam:   03/20/2022 Medical Rec #: [id number]       Accession #:    [id number] Date of Birth: [birth year]       Patient Gender: M Patient Age:   66 years Exam Location:  [facility] Procedure:      VAS [place] LOWER  EXTREMITY SAPHENOUS VEIN MAPPING Referring Phys: Theron Arista VANTRIGT --------------------------------------------------------------------------------  Indications:  Pre-op CABG Risk Factors: Hypertension, Diabetes, past history of smoking, prior MI,               coronary artery disease.   Comparison Study: No prior studies. Performing Technologist: Jean Rosenthal RDMS, RVT  Examination Guidelines: A complete evaluation includes B-mode imaging, spectral Doppler, color Doppler, and power Doppler as needed of all accessible portions of each vessel. Bilateral testing is considered an integral part of a complete examination. Limited examinations for reoccurring indications may be performed as noted. +------------+-----------+----------------------+------------+-----------------+ RT Diameter RT Findings         GSV          LT Diameter    LT Findings        (cm)                                         (cm)                      +------------+-----------+----------------------+------------+-----------------+     0.66                   Saphenofemoral        0.71                                                    Junction                                     +------------+-----------+----------------------+------------+-----------------+     0.63                   Proximal thigh        0.80                      +------------+-----------+----------------------+------------+-----------------+     0.40                     Mid thigh           0.38        branching     +------------+-----------+----------------------+------------+-----------------+     0.30                    Distal thigh         0.41                      +------------+-----------+----------------------+------------+-----------------+     0.34                        Knee             0.35        Overlying                                                                varicosities    +------------+-----------+----------------------+------------+-----------------+  0.34     branching       Prox calf           0.46        Multiple                                                               branches/varicosi                                                                ties         +------------+-----------+----------------------+------------+-----------------+     0.25                      Mid calf           0.40                      +------------+-----------+----------------------+------------+-----------------+     0.42     branching      Distal calf          0.37                      +------------+-----------+----------------------+------------+-----------------+     0.36                       Ankle             0.30                      +------------+-----------+----------------------+------------+-----------------+ Diagnosing physician: Gerarda Fraction Electronically signed by Gerarda Fraction on 03/20/2022 at 3:31:55 PM.    Final   ? ?VAS US DOPPLER PRE CABG ? ?Result Date: 03/20/2022 ?PREOPERATIVE VASCULAR EVALUATION Patient Name:  Arthur Church  Date of Exam:   03/20/2022 Medical Rec #: [id number]       Accession #:    [id number] Date of Birth: January 13, 1956       Patient Gender: M Patient Age:   31 years Exam Location:  [facility] Procedure:      VAS US DOPPLER PRE CABG Referring Phys: Theron Arista VANTRIGT --------------------------------------------------------------------------------  Indications:  Pre-CABG. Risk Factors: Hypertension, hyperlipidemia, Diabetes, past history of smoking,               prior MI, coronary artery disease. Performing Technologist: Jean Rosenthal RDMS RVT  Examination Guidelines: A complete evaluation includes B-mode imaging, spectral Doppler, color Doppler, and power Doppler as needed of all accessible portions of each vessel. Bilateral testing is considered an integral part of a complete examination. Limited examinations for reoccurring indications may be performed as noted.  Right Carotid Findings: +----------+--------+--------+--------+------------+--------+           PSV cm/sEDV cm/sStenosisDescribe    Comments +----------+--------+--------+--------+------------+--------+ CCA Prox  87      17                                    +----------+--------+--------+--------+------------+--------+ CCA Distal90  18                                   +----------+--------+--------+--------+------------+--------+ ICA Prox  54      18      1-39%   heterogenous         +----------+--------+--------+--------+------------+--------+ ICA Distal80

## 2022-03-21 NOTE — Progress Notes (Addendum)
Pt had 14 beat run of wide QRS. Pt asymptomatic. Called cardiology and advised.   ?

## 2022-03-21 NOTE — Progress Notes (Signed)
Pt reports constipation for a "few" days, endorses flatulence present.  States the daily milk of magnesia has not worked and stated he uses psyllium at home daily. Dr. Sharyn Lull notified with new orders.  Will continue to monitor pt. ?

## 2022-03-21 NOTE — Progress Notes (Signed)
ANTICOAGULATION CONSULT NOTE ? ?Pharmacy Consult for Heparin ?Indication: chest pain/ACS ? ?No Known Allergies ? ?Patient Measurements: ?Height: 6\' 1"  (185.4 cm) ?Weight: (!) 142.9 kg (315 lb 0.6 oz) ?IBW/kg (Calculated) : 79.9 ?Heparin Dosing Weight: 112 kg ? ?Vital Signs: ?Temp: 98.2 ?F (36.8 ?C) (04/08 0536) ?Temp Source: Axillary (04/08 0536) ?BP: 105/56 (04/08 0536) ?Pulse Rate: 74 (04/08 0536) ? ?Labs: ?Recent Labs  ?  03/19/22 ?0414 03/20/22 ?0732 03/21/22 ?0230  ?HGB 12.9* 12.7* 13.2  ?HCT 40.6 40.6 40.2  ?PLT 260 236 219  ?HEPARINUNFRC 0.38 0.58 0.47  ?CREATININE 0.63 0.64 0.66  ? ? ? ?Estimated Creatinine Clearance: 138.7 mL/min (by C-G formula based on SCr of 0.66 mg/dL). ? ? ?Assessment: ?66 years old white male with PMH of CHF, obesity, atrial fibrillation on Eliquis PTA has intermittent chest pain with radiation to both shoulders. Cath found 2 vessel disease with inability to place percutaneous stent.  Last Eliquis dose pm on 3/31. Pharmacy consulted for heparin to start after TR band removal.  ? ?Heparin level therapeutic at 0.47 on 2200 units/hr. CBC remains stable. No s/sx of bleeding noted. CABG planned for 4/11 AM. ? ? ?Goal of Therapy:  ?Heparin level 0.3-0.7 units/mL ?Monitor platelets by anticoagulation protocol: Yes ?  ?Plan:  ?Cont heparin at 2200 units/hr ?Daily CBC and heparin level ?F/U anticoagulation plans after CABG on 4/11 ? ?Joseph Art, Pharm.D. ?PGY-1 Pharmacy Resident ?[id number] ?03/21/2022 7:02 AM ? ? ?

## 2022-03-22 LAB — BASIC METABOLIC PANEL
Anion gap: 6 (ref 5–15)
BUN: 12 mg/dL (ref 8–23)
CO2: 26 mmol/L (ref 22–32)
Calcium: 9 mg/dL (ref 8.9–10.3)
Chloride: 104 mmol/L (ref 98–111)
Creatinine, Ser: 0.53 mg/dL — ABNORMAL LOW (ref 0.61–1.24)
GFR, Estimated: >60 mL/min (ref >60)
Glucose, Bld: 111 mg/dL — ABNORMAL HIGH (ref 70–99)
Potassium: 4.6 mmol/L (ref 3.5–5.1)
Sodium: 136 mmol/L (ref 135–145)

## 2022-03-22 LAB — HEPARIN LEVEL (UNFRACTIONATED): Heparin Unfractionated: 0.3 IU/mL (ref 0.30–0.70)

## 2022-03-22 MED ORDER — BISACODYL 5 MG PO TBEC
10.0000 mg | DELAYED_RELEASE_TABLET | Freq: Every day | ORAL | Status: DC | PRN
  Administered 2022-03-22: 10 mg via ORAL
  Filled 2022-03-22: qty 2

## 2022-03-22 MED ORDER — BISACODYL 10 MG RE SUPP: 10.0000 mg | Freq: Every day | RECTAL | Status: DC | PRN

## 2022-03-22 MED ORDER — POLYETHYLENE GLYCOL 3350 17 G PO PACK
17.0000 g | PACK | Freq: Every day | ORAL | Status: DC
  Administered 2022-03-22 – 2022-03-23 (×2): 17 g via ORAL
  Filled 2022-03-22 (×2): qty 1

## 2022-03-22 NOTE — Progress Notes (Signed)
ANTICOAGULATION CONSULT NOTE ? ?Pharmacy Consult for Heparin ?Indication: chest pain/ACS ? ?No Known Allergies ? ?Patient Measurements: ?Height: 6\' 1"  (185.4 cm) ?Weight: (!) 140.3 kg (309 lb 4.9 oz) ?IBW/kg (Calculated) : 79.9 ?Heparin Dosing Weight: 112 kg ? ?Vital Signs: ?Temp: 98.7 ?F (37.1 ?C) (04/09 [id number]) ?Temp Source: Oral (04/09 [id number]) ?BP: 124/50 (04/09 0528) ?Pulse Rate: 72 (04/09 0528) ? ?Labs: ?Recent Labs  ?  03/20/22 ?0732 03/21/22 ?0230 03/22/22 ?0231 03/22/22 ?0234  ?HGB 12.7* 13.2  --   --   ?HCT 40.6 40.2  --   --   ?PLT 236 219  --   --   ?HEPARINUNFRC 0.58 0.47  --  0.30  ?CREATININE 0.64 0.66 0.53*  --   ? ? ? ?Estimated Creatinine Clearance: 137.4 mL/min (A) (by C-G formula based on SCr of 0.53 mg/dL (L)). ? ? ?Assessment: ?66 years old white male with PMH of CHF, obesity, atrial fibrillation on Eliquis PTA has intermittent chest pain with radiation to both shoulders. Cath found 2 vessel disease with inability to place percutaneous stent.  Last Eliquis dose pm on 3/31. Pharmacy consulted for heparin to start after TR band removal.  ? ?Heparin level is at the lower end of therapeutic at 0.30 on 2200 units/hr. CBC remains stable. No s/sx of bleeding noted. CABG planned for 4/11 AM. Will slightly increase rate to keep patient within a therapeutic range.  ? ? ?Goal of Therapy:  ?Heparin level 0.3-0.7 units/mL ?Monitor platelets by anticoagulation protocol: Yes ?  ?Plan:  ?Cont heparin at 2250 units/hr ?Daily CBC and heparin level ?F/U anticoagulation plans after CABG on 4/11 ? ?Joseph Art, Pharm.D. ?PGY-1 Pharmacy Resident ?[id number] ?03/22/2022 6:57 AM ? ? ?

## 2022-03-22 NOTE — Progress Notes (Signed)
Subjective:  ?Patient denies any chest pain or shortness of breath complains of constipation. ? ?Objective:  ?Vital Signs in the last 24 hours: ?Temp:  [97.6 ?F (36.4 ?C)-98.7 ?F (37.1 ?C)] 97.6 ?F (36.4 ?C) (04/09 0805) ?Pulse Rate:  [61-72] 67 (04/09 0805) ?Resp:  [18-20] 20 (04/09 0805) ?BP: (121-139)/(50-71) 139/70 (04/09 0805) ?SpO2:  [93 %-97 %] 97 % (04/09 0859) ?FiO2 (%):  [21 %] 21 % (04/09 0859) ?Weight:  [140.3 kg] 140.3 kg (04/09 0528) ? ?Intake/Output from previous day: ?04/08 0701 - 04/09 0700 ?In: 5169 [P.O.:240; I.V.:4885.9; IV Piggyback:43.1] ?Out: 2150 [Urine:2150] ?Intake/Output from this shift: ?Total I/O ?In: -  ?Out: 600 [Urine:600] ? ?Physical Exam: ?Neck: no adenopathy, no carotid bruit, no JVD, and supple, symmetrical, trachea midline ?Lungs: clear to auscultation bilaterally ?Heart: regular rate and rhythm, S1, S2 normal, and 2/6 systolic murmur noted ?Abdomen: soft, non-tender; bowel sounds normal; no masses,  no organomegaly ?Extremities: extremities normal, atraumatic, no cyanosis or edema ? ?Lab Results: ?Recent Labs  ?  03/20/22 ?0732 03/21/22 ?0230  ?WBC 9.4 12.2*  ?HGB 12.7* 13.2  ?PLT 236 219  ? ?Recent Labs  ?  03/21/22 ?0230 03/22/22 ?0231  ?NA 135 136  ?K 4.2 4.6  ?CL 103 104  ?CO2 24 26  ?GLUCOSE 104* 111*  ?BUN 10 12  ?CREATININE 0.66 0.53*  ? ?No results for input(s): TROPONINI in the last 72 hours. ? ?Invalid input(s): CK, MB ?Hepatic Function Panel ?Recent Labs  ?  03/20/22 ?0732  ?PROT 6.1*  ?ALBUMIN 3.1*  ?AST 25  ?ALT 29  ?ALKPHOS 59  ?BILITOT 0.8  ?BILIDIR 0.1  ?IBILI 0.7  ? ?No results for input(s): CHOL in the last 72 hours. ?No results for input(s): PROTIME in the last 72 hours. ? ?Imaging: ?Imaging results have been reviewed and CT chest w/o contrast ? ?Result Date: 03/20/2022 ?CLINICAL DATA:  Chest pain. EXAM: CT CHEST WITHOUT CONTRAST TECHNIQUE: Multidetector CT imaging of the chest was performed following the standard protocol without IV contrast. RADIATION DOSE  REDUCTION: This exam was performed according to the departmental dose-optimization program which includes automated exposure control, adjustment of the mA and/or kV according to patient size and/or use of iterative reconstruction technique. COMPARISON:  August 11, 2019. FINDINGS: Cardiovascular: Atherosclerosis of thoracic aorta is noted without aneurysm formation. Normal cardiac size. No pericardial effusion. Extensive coronary artery calcifications are noted. Mediastinum/Nodes: No enlarged mediastinal or axillary lymph nodes. Thyroid gland, trachea, and esophagus demonstrate no significant findings. Lungs/Pleura: No pneumothorax or pleural effusion is noted. Elevated left hemidiaphragm is noted with left basilar subsegmental atelectasis. Mild right posterior basilar subsegmental atelectasis is noted. Upper Abdomen: No acute abnormality. Musculoskeletal: No chest wall mass or suspicious bone lesions identified. IMPRESSION: Elevated left hemidiaphragm is noted with mild left basilar subsegmental atelectasis. Mild right posterior basilar subsegmental atelectasis is noted as well. Extensive coronary artery calcifications are noted suggesting coronary artery disease. Aortic Atherosclerosis (ICD10-I70.0). Electronically Signed   By: Marijo Conception M.D.   On: 03/20/2022 12:49  ? ?VAS US DOPPLER PRE CABG ? ?Result Date: 03/20/2022 ?PREOPERATIVE VASCULAR EVALUATION Patient Name:  RUPINDER THERRELL  Date of Exam:   03/20/2022 Medical Rec #: [id number]       Accession #:    [id number] Date of Birth: 1956/03/15       Patient Gender: M Patient Age:   66 years Exam Location:  [facility] Procedure:      VAS US DOPPLER PRE CABG Referring Phys: Collier Salina VANTRIGT --------------------------------------------------------------------------------  Indications:  Pre-CABG. Risk Factors: Hypertension, hyperlipidemia, Diabetes, past history of smoking,               prior MI, coronary artery disease. Performing Technologist: Darlin Coco  RDMS RVT  Examination Guidelines: A complete evaluation includes B-mode imaging, spectral Doppler, color Doppler, and power Doppler as needed of all accessible portions of each vessel. Bilateral testing is considered an integral part of a complete examination. Limited examinations for reoccurring indications may be performed as noted.  Right Carotid Findings: +----------+--------+--------+--------+------------+--------+           PSV cm/sEDV cm/sStenosisDescribe    Comments +----------+--------+--------+--------+------------+--------+ CCA Prox  87      17                                   +----------+--------+--------+--------+------------+--------+ CCA Distal90      18                                   +----------+--------+--------+--------+------------+--------+ ICA Prox  54      18      1-39%   heterogenous         +----------+--------+--------+--------+------------+--------+ ICA Distal80      31                                   +----------+--------+--------+--------+------------+--------+ ECA       103     20                                   +----------+--------+--------+--------+------------+--------+ +----------+--------+-------+----------------+------------+           PSV cm/sEDV cmsDescribe        Arm Pressure +----------+--------+-------+----------------+------------+ Subclavian109            Multiphasic, WNL             +----------+--------+-------+----------------+------------+ +---------+--------+--+--------+--+---------+ VertebralPSV cm/s45EDV cm/s12Antegrade +---------+--------+--+--------+--+---------+ Left Carotid Findings: +----------+--------+--------+--------+--------------------+--------+           PSV cm/sEDV cm/sStenosisDescribe            Comments +----------+--------+--------+--------+--------------------+--------+ CCA Prox  101     24                                            +----------+--------+--------+--------+--------------------+--------+ CCA Distal85      27                                           +----------+--------+--------+--------+--------------------+--------+ ICA Prox  123     42      40-59%  calcific and diffuse         +----------+--------+--------+--------+--------------------+--------+ ICA Mid   90      37                                           +----------+--------+--------+--------+--------------------+--------+ ICA Distal108     39                                           +----------+--------+--------+--------+--------------------+--------+  ECA       72      13                                           +----------+--------+--------+--------+--------------------+--------+ +----------+--------+--------+----------------+------------+ SubclavianPSV cm/sEDV cm/sDescribe        Arm Pressure +----------+--------+--------+----------------+------------+           184             Multiphasic, WNL             +----------+--------+--------+----------------+------------+ +---------+--------+--+--------+--+---------+ VertebralPSV cm/s62EDV cm/s13Antegrade +---------+--------+--+--------+--+---------+  ABI Findings: +---------+------------------+-----+---------+--------+ Right    Rt Pressure (mmHg)IndexWaveform Comment  +---------+------------------+-----+---------+--------+ Brachial 111                    triphasic         +---------+------------------+-----+---------+--------+ PTA      112               0.93 triphasic         +---------+------------------+-----+---------+--------+ DP       132               1.10 triphasic         +---------+------------------+-----+---------+--------+ Great Toe84                0.70 Normal            +---------+------------------+-----+---------+--------+ +---------+------------------+-----+---------+-------+ Left     Lt Pressure (mmHg)IndexWaveform  Comment +---------+------------------+-----+---------+-------+ Brachial 120                    triphasic        +---------+------------------+-----+---------+-------+ PTA      116               0.97 biphasic         +---------+------------------+-----+---------+-------+ DP       124               1.03 triphasic        +---------+------------------+-----+---------+-------+ Great Toe77                0.64 Abnorm

## 2022-03-22 NOTE — Progress Notes (Signed)
Patient refusing Mucinex and Zoloft, states he has not been taking these and does not want to take them now.  Patient requests these be discontinued. ?

## 2022-03-23 ENCOUNTER — Inpatient Hospital Stay (HOSPITAL_COMMUNITY): Payer: Medicare (Managed Care)

## 2022-03-23 DIAGNOSIS — I2510 Atherosclerotic heart disease of native coronary artery without angina pectoris: Secondary | ICD-10-CM | POA: Diagnosis not present

## 2022-03-23 LAB — CBC
HCT: 41.2 % (ref 39.0–52.0)
Hemoglobin: 13.5 g/dL (ref 13.0–17.0)
MCH: 28 pg (ref 26.0–34.0)
MCHC: 32.8 g/dL (ref 30.0–36.0)
MCV: 85.3 fL (ref 80.0–100.0)
Platelets: 252 10*3/uL (ref 150–400)
RBC: 4.83 MIL/uL (ref 4.22–5.81)
RDW: 14.8 % (ref 11.5–15.5)
WBC: 16.4 10*3/uL — ABNORMAL HIGH (ref 4.0–10.5)
nRBC: 0 % (ref 0.0–0.2)

## 2022-03-23 LAB — ABO/RH: ABO/RH(D): A POS

## 2022-03-23 LAB — HEMOGLOBIN A1C
Hgb A1c MFr Bld: 5.9 % — ABNORMAL HIGH (ref 4.8–5.6)
Mean Plasma Glucose: 122.63 mg/dL

## 2022-03-23 LAB — BASIC METABOLIC PANEL
Anion gap: 8 (ref 5–15)
BUN: 9 mg/dL (ref 8–23)
CO2: 28 mmol/L (ref 22–32)
Calcium: 9.5 mg/dL (ref 8.9–10.3)
Chloride: 100 mmol/L (ref 98–111)
Creatinine, Ser: 0.71 mg/dL (ref 0.61–1.24)
GFR, Estimated: >60 mL/min (ref >60)
Glucose, Bld: 120 mg/dL — ABNORMAL HIGH (ref 70–99)
Potassium: 4.5 mmol/L (ref 3.5–5.1)
Sodium: 136 mmol/L (ref 135–145)

## 2022-03-23 LAB — PLATELET INHIBITION P2Y12

## 2022-03-23 LAB — APTT: aPTT: 98 seconds — ABNORMAL HIGH (ref 24–36)

## 2022-03-23 LAB — PREPARE RBC (CROSSMATCH)

## 2022-03-23 LAB — MAGNESIUM: Magnesium: 1.6 mg/dL — ABNORMAL LOW (ref 1.7–2.4)

## 2022-03-23 LAB — HEPARIN LEVEL (UNFRACTIONATED): Heparin Unfractionated: 0.42 IU/mL (ref 0.30–0.70)

## 2022-03-23 LAB — PROTIME-INR
INR: 1.1 (ref 0.8–1.2)
Prothrombin Time: 13.8 seconds (ref 11.4–15.2)

## 2022-03-23 MED ORDER — MAGNESIUM SULFATE 2 GM/50ML IV SOLN
2.0000 g | Freq: Once | INTRAVENOUS | Status: AC
  Administered 2022-03-23: 2 g via INTRAVENOUS
  Filled 2022-03-23: qty 50

## 2022-03-23 MED ORDER — NOREPINEPHRINE 4 MG/250ML-% IV SOLN
0.0000 ug/min | INTRAVENOUS | Status: AC
  Administered 2022-03-24: 3 ug/min via INTRAVENOUS
  Filled 2022-03-23: qty 250

## 2022-03-23 MED ORDER — PHENYLEPHRINE HCL-NACL 20-0.9 MG/250ML-% IV SOLN
30.0000 ug/min | INTRAVENOUS | Status: AC
  Administered 2022-03-24: 20 ug/min via INTRAVENOUS
  Filled 2022-03-23: qty 250

## 2022-03-23 MED ORDER — CHLORHEXIDINE GLUCONATE 4 % EX LIQD
60.0000 mL | Freq: Once | CUTANEOUS | Status: AC
  Administered 2022-03-23: 4 via TOPICAL
  Filled 2022-03-23: qty 60

## 2022-03-23 MED ORDER — POTASSIUM CHLORIDE 2 MEQ/ML IV SOLN
80.0000 meq | INTRAVENOUS | Status: DC
  Filled 2022-03-23: qty 40

## 2022-03-23 MED ORDER — MAGNESIUM SULFATE 50 % IJ SOLN
40.0000 meq | INTRAMUSCULAR | Status: DC
  Filled 2022-03-23: qty 9.85

## 2022-03-23 MED ORDER — VANCOMYCIN HCL 1500 MG/300ML IV SOLN
1500.0000 mg | INTRAVENOUS | Status: AC
  Administered 2022-03-24: 1500 mg via INTRAVENOUS
  Filled 2022-03-23: qty 300

## 2022-03-23 MED ORDER — TEMAZEPAM 15 MG PO CAPS
15.0000 mg | ORAL_CAPSULE | Freq: Once | ORAL | Status: AC | PRN
  Administered 2022-03-24: 15 mg via ORAL
  Filled 2022-03-23 (×2): qty 1

## 2022-03-23 MED ORDER — CHLORHEXIDINE GLUCONATE 0.12 % MT SOLN
15.0000 mL | Freq: Once | OROMUCOSAL | Status: AC
  Administered 2022-03-24: 15 mL via OROMUCOSAL
  Filled 2022-03-23: qty 15

## 2022-03-23 MED ORDER — TRANEXAMIC ACID (OHS) BOLUS VIA INFUSION
15.0000 mg/kg | INTRAVENOUS | Status: AC
  Administered 2022-03-24: 2083.5 mg via INTRAVENOUS
  Filled 2022-03-23: qty 2084

## 2022-03-23 MED ORDER — TRANEXAMIC ACID 1000 MG/10ML IV SOLN
1.5000 mg/kg/h | INTRAVENOUS | Status: AC
  Administered 2022-03-24 (×2): 1.5 mg/kg/h via INTRAVENOUS
  Filled 2022-03-23: qty 25

## 2022-03-23 MED ORDER — DEXMEDETOMIDINE HCL IN NACL 400 MCG/100ML IV SOLN
0.1000 ug/kg/h | INTRAVENOUS | Status: AC
  Administered 2022-03-24: .4 ug/kg/h via INTRAVENOUS
  Filled 2022-03-23: qty 100

## 2022-03-23 MED ORDER — PAPAVERINE HCL 30 MG/ML IJ SOLN
INTRAMUSCULAR | Status: DC
  Filled 2022-03-23: qty 2.5

## 2022-03-23 MED ORDER — CHLORHEXIDINE GLUCONATE 4 % EX LIQD
60.0000 mL | Freq: Once | CUTANEOUS | Status: AC
  Administered 2022-03-24: 4 via TOPICAL
  Filled 2022-03-23: qty 60

## 2022-03-23 MED ORDER — DIAZEPAM 5 MG PO TABS
5.0000 mg | ORAL_TABLET | Freq: Once | ORAL | Status: AC
  Administered 2022-03-24: 5 mg via ORAL
  Filled 2022-03-23: qty 1

## 2022-03-23 MED ORDER — MILRINONE LACTATE IN DEXTROSE 20-5 MG/100ML-% IV SOLN
0.3000 ug/kg/min | INTRAVENOUS | Status: AC
Start: 2022-03-24 — End: 2022-03-24
  Administered 2022-03-24: .25 ug/kg/min via INTRAVENOUS
  Filled 2022-03-23: qty 100

## 2022-03-23 MED ORDER — BISACODYL 5 MG PO TBEC
5.0000 mg | DELAYED_RELEASE_TABLET | Freq: Once | ORAL | Status: AC
  Administered 2022-03-23: 5 mg via ORAL
  Filled 2022-03-23: qty 1

## 2022-03-23 MED ORDER — HEPARIN 30,000 UNITS/1000 ML (OHS) CELLSAVER SOLUTION
Status: DC
  Filled 2022-03-23: qty 1000

## 2022-03-23 MED ORDER — METOPROLOL TARTRATE 12.5 MG HALF TABLET
12.5000 mg | ORAL_TABLET | Freq: Once | ORAL | Status: AC
  Administered 2022-03-24: 12.5 mg via ORAL
  Filled 2022-03-23: qty 1

## 2022-03-23 MED ORDER — NITROGLYCERIN IN D5W 200-5 MCG/ML-% IV SOLN
2.0000 ug/min | INTRAVENOUS | Status: AC
  Administered 2022-03-24: 15 ug/min via INTRAVENOUS
  Filled 2022-03-23: qty 250

## 2022-03-23 MED ORDER — TRANEXAMIC ACID (OHS) PUMP PRIME SOLUTION
2.0000 mg/kg | INTRAVENOUS | Status: DC
  Filled 2022-03-23: qty 2.78

## 2022-03-23 MED ORDER — CEFAZOLIN SODIUM-DEXTROSE 2-4 GM/100ML-% IV SOLN
2.0000 g | INTRAVENOUS | Status: DC
Start: 2022-03-24 — End: 2022-03-24
  Filled 2022-03-23: qty 100

## 2022-03-23 MED ORDER — INSULIN REGULAR(HUMAN) IN NACL 100-0.9 UT/100ML-% IV SOLN
INTRAVENOUS | Status: AC
  Administered 2022-03-24: 2.6 [IU]/h via INTRAVENOUS
  Filled 2022-03-23: qty 100

## 2022-03-23 MED ORDER — CEFAZOLIN IN SODIUM CHLORIDE 3-0.9 GM/100ML-% IV SOLN
3.0000 g | INTRAVENOUS | Status: AC
  Administered 2022-03-24 (×2): 3 g via INTRAVENOUS
  Filled 2022-03-23: qty 100

## 2022-03-23 MED ORDER — CARVEDILOL 3.125 MG PO TABS
3.1250 mg | ORAL_TABLET | Freq: Once | ORAL | Status: AC
  Administered 2022-03-23: 3.125 mg via ORAL

## 2022-03-23 MED ORDER — EPINEPHRINE HCL 5 MG/250ML IV SOLN IN NS
0.0000 ug/min | INTRAVENOUS | Status: DC
  Filled 2022-03-23: qty 250

## 2022-03-23 NOTE — Anesthesia Preprocedure Evaluation (Addendum)
Anesthesia Evaluation  ?Patient identified by MRN, date of birth, ID band ?Patient awake ? ? ? ?Reviewed: ?Allergy & Precautions, NPO status , Patient's Chart, lab work & pertinent test results ? ?Airway ?Mallampati: II ? ?TM Distance: >3 FB ?Neck ROM: Full ? ? ? Dental ?no notable dental hx. ? ?  ?Pulmonary ?COPD (O2 overnight),  COPD inhaler and oxygen dependent, former smoker,  ?  ? ?+ decreased breath sounds ? ? ? ? ? Cardiovascular ?hypertension, Pt. on medications and Pt. on home beta blockers ?+ CAD, + Past MI and +CHF  ?+ dysrhythmias Atrial Fibrillation  ?Rhythm:Irregular Rate:Normal ? ? ?  ?Neuro/Psych ?  ? GI/Hepatic ?Neg liver ROS, GERD  Medicated,  ?Endo/Other  ?diabetesMorbid obesity ? Renal/GU ?negative Renal ROS  ? ?  ?Musculoskeletal ?negative musculoskeletal ROS ?(+)  ? Abdominal ?Normal abdominal exam  (+)   ?Peds ? Hematology ?negative hematology ROS ?(+)   ?Anesthesia Other Findings ? ? Reproductive/Obstetrics ? ?  ? ? ? ? ? ? ? ? ? ? ? ? ? ?  ?  ? ? ? ? ? ? ?Anesthesia Physical ?Anesthesia Plan ? ?ASA: 4 ? ?Anesthesia Plan: General  ? ?Post-op Pain Management:   ? ?Induction: Intravenous ? ?PONV Risk Score and Plan: 2 and Midazolam and Treatment may vary due to age or medical condition ? ?Airway Management Planned: Mask and Oral ETT ? ?Additional Equipment: Arterial line, CVP, PA Cath, TEE, 3D TEE and Ultrasound Guidance Line Placement ? ?Intra-op Plan:  ? ?Post-operative Plan: Post-operative intubation/ventilation ? ?Informed Consent: I have reviewed the patients History and Physical, chart, labs and discussed the procedure including the risks, benefits and alternatives for the proposed anesthesia with the patient or authorized representative who has indicated his/her understanding and acceptance.  ? ? ? ?Dental advisory given ? ?Plan Discussed with: CRNA ? ?Anesthesia Plan Comments: (Lab Results ?     Component                Value               Date                  ?     WBC                      16.4 (H)            03/23/2022           ?     HGB                      13.5                03/23/2022           ?     HCT                      41.2                03/23/2022           ?     MCV                      85.3                03/23/2022           ?     PLT  252                 03/23/2022           ?Lab Results ?     Component                Value               Date                 ?     NA                       136                 03/23/2022           ?     K                        4.5                 03/23/2022           ?     CO2                      28                  03/23/2022           ?     GLUCOSE                  120 (H)             03/23/2022           ?     BUN                      9                   03/23/2022           ?     CREATININE               0.71                03/23/2022           ?     CALCIUM                  9.5                 03/23/2022           ?     GFRNONAA                 >60                 03/23/2022           ? ?Echo 04/23: ??1. Left ventricular ejection fraction, by estimation, is 55 to 60%. The  ?left ventricle has normal function. The left ventricle has no regional  ?wall motion abnormalities. There is mild concentric left ventricular  ?hypertrophy. Left ventricular diastolic  ?parameters are consistent with Grade I diastolic dysfunction (impaired  ?relaxation).  ??2. Right ventricular systolic function is normal. The right ventricular  ?size is normal.  ??3. Left atrial size was severely dilated.  ??4. Right atrial size was moderately dilated.  ??5. The mitral valve is degenerative. Mild to moderate  mitral valve  ?regurgitation.  ??6. The aortic valve is tricuspid. There is mild calcification of the  ?aortic valve. There is mild thickening of the aortic valve. Aortic valve  ?regurgitation is not visualized.  ??7. The inferior vena cava is normal in size with <50% respiratory  ?variability, suggesting right  atrial pressure of 8 mmHg.  ? ?Cath 04/23: ??  Prox LAD to Mid LAD lesion is 85% stenosed. ??  Prox RCA lesion is 90% stenosed. ??  The left ventricular systolic function is normal. ??  LV end diastolic pressure is normal. ?)  ? ? ? ? ? ?Anesthesia Quick Evaluation ? ?

## 2022-03-23 NOTE — Progress Notes (Signed)
Ref: Virgilio Belling, PA-C ? ? ?Subjective:  ?Awake. Back in atrial fibrillation but heart rate is controlled post carvedilol and IV magnesium use.. ? ?Objective:  ?Vital Signs in the last 24 hours: ?Temp:  [97.8 ?F (36.6 ?C)-98.6 ?F (37 ?C)] 98.1 ?F (36.7 ?C) (04/10 2045) ?Pulse Rate:  [83-98] 98 (04/10 1415) ?Cardiac Rhythm: Atrial fibrillation (04/10 2015) ?Resp:  [15-18] 18 (04/10 2045) ?BP: (107-158)/(34-91) 107/80 (04/10 2045) ?SpO2:  [92 %-93 %] 92 % (04/10 0803) ?Weight:  [138.9 kg] 138.9 kg (04/10 0520) ? ?Physical Exam: ?BP Readings from Last 1 Encounters:  ?03/23/22 107/80  ?   ?Wt Readings from Last 1 Encounters:  ?03/23/22 (!) 138.9 kg  ?  Weight change: -1.409 kg Body mass index is 40.4 kg/m?. ?HEENT: [place]/AT, Eyes-Brown,  Conjunctiva-Pink, Sclera-Non-icteric ?Neck: No JVD, No bruit, Trachea midline. ?Lungs:  Clear, Bilateral. ?Cardiac:  Irregular rhythm, normal S1 and S2, no S3. II/VI systolic murmur. ?Abdomen:  Soft, non-tender. BS present. ?Extremities:  No edema present. No cyanosis. No clubbing. ?CNS: AxOx3, Cranial nerves grossly intact, moves all 4 extremities.  ?Skin: Warm and dry. ? ? ?Intake/Output from previous day: ?04/09 0701 - 04/10 0700 ?In: 2817.9 [P.O.:960; I.V.:1857.9] ?Out: 4550 [Urine:4550] ? ? ? ?Lab Results: ?BMET ?   ?Component Value Date/Time  ? NA 136 03/23/2022 0505  ? NA 136 03/22/2022 0231  ? NA 135 03/21/2022 0230  ? NA 140 03/07/2020 1446  ? NA 140 08/02/2019 1419  ? NA 140 07/13/2019 1218  ? K 4.5 03/23/2022 0505  ? K 4.6 03/22/2022 0231  ? K 4.2 03/21/2022 0230  ? CL 100 03/23/2022 0505  ? CL 104 03/22/2022 0231  ? CL 103 03/21/2022 0230  ? CO2 28 03/23/2022 0505  ? CO2 26 03/22/2022 0231  ? CO2 24 03/21/2022 0230  ? GLUCOSE 120 (H) 03/23/2022 0505  ? GLUCOSE 111 (H) 03/22/2022 0231  ? GLUCOSE 104 (H) 03/21/2022 0230  ? BUN 9 03/23/2022 0505  ? BUN 12 03/22/2022 0231  ? BUN 10 03/21/2022 0230  ? BUN 19 03/07/2020 1446  ? BUN 15 08/02/2019 1419  ? BUN 14 07/13/2019 1218   ? CREATININE 0.71 03/23/2022 0505  ? CREATININE 0.53 (L) 03/22/2022 0231  ? CREATININE 0.66 03/21/2022 0230  ? CREATININE 1.08 04/23/2015 1443  ? CREATININE 1.00 03/15/2015 1542  ? CALCIUM 9.5 03/23/2022 0505  ? CALCIUM 9.0 03/22/2022 0231  ? CALCIUM 8.9 03/21/2022 0230  ? GFRNONAA >60 03/23/2022 0505  ? GFRNONAA >60 03/22/2022 0231  ? GFRNONAA >60 03/21/2022 0230  ? GFRNONAA 83 03/15/2015 1542  ? GFRAA 118 03/07/2020 1446  ? GFRAA 115 08/02/2019 1419  ? GFRAA 123 07/13/2019 1218  ? GFRAA >89 03/15/2015 1542  ? ?CBC ?   ?Component Value Date/Time  ? WBC 16.4 (H) 03/23/2022 0505  ? RBC 4.83 03/23/2022 0505  ? HGB 13.5 03/23/2022 0505  ? HGB 15.5 11/30/2018 1204  ? HCT 41.2 03/23/2022 0505  ? HCT 45.3 11/30/2018 1204  ? PLT 252 03/23/2022 0505  ? PLT 229 11/30/2018 1204  ? MCV 85.3 03/23/2022 0505  ? MCV 88 11/30/2018 1204  ? MCH 28.0 03/23/2022 0505  ? MCHC 32.8 03/23/2022 0505  ? RDW 14.8 03/23/2022 0505  ? RDW 12.0 (L) 11/30/2018 1204  ? LYMPHSABS 3.0 06/12/2019 1603  ? MONOABS 0.7 06/12/2019 1603  ? EOSABS 0.3 06/12/2019 1603  ? BASOSABS 0.1 06/12/2019 1603  ? ?HEPATIC Function Panel ?Recent Labs  ?  03/20/22 ?0732  ?PROT 6.1*  ? ?  HEMOGLOBIN A1C ?No components found for: HGA1C,  MPG ?CARDIAC ENZYMES ?Lab Results  ?Component Value Date  ? TROPONINI 0.04 (H) 03/05/2015  ? TROPONINI 0.05 (H) 03/05/2015  ? TROPONINI 0.05 (H) 03/05/2015  ? ?BNP ?No results for input(s): PROBNP in the last 8760 hours. ?TSH ?Recent Labs  ?  03/20/22 ?0732  ?TSH 2.492  ? ?CHOLESTEROL ?Recent Labs  ?  03/15/22 ?0408  ?CHOL 165  ? ? ?Scheduled Meds: ? ALPRAZolam  0.25 mg Oral BID  ? aspirin EC  81 mg Oral Daily  ? atorvastatin  80 mg Oral q1800  ? carvedilol  3.125 mg Oral BID WC  ? [START ON 03/24/2022] chlorhexidine  60 mL Topical Once  ? [START ON 03/24/2022] chlorhexidine  15 mL Mouth/Throat Once  ? Chlorhexidine Gluconate Cloth  6 each Topical Q0600  ? [START ON 03/24/2022] diazepam  5 mg Oral Once  ? dofetilide  500 mcg Oral BID  ?  [START ON 03/24/2022] epinephrine  0-10 mcg/min Intravenous To OR  ? fluticasone furoate-vilanterol  1 puff Inhalation Daily  ? And  ? umeclidinium bromide  1 puff Inhalation Daily  ? [START ON 03/24/2022] heparin-papaverine-plasmalyte irrigation   Irrigation To OR  ? [START ON 03/24/2022] insulin   Intravenous To OR  ? lisinopril  10 mg Oral Daily  ? loratadine  10 mg Oral Daily  ? magnesium oxide  400 mg Oral QHS  ? [START ON 03/24/2022] magnesium sulfate  40 mEq Other To OR  ? [START ON 03/24/2022] metoprolol tartrate  12.5 mg Oral Once  ? montelukast  10 mg Oral QHS  ?  morphine injection  1 mg Intravenous Once  ? mupirocin ointment  1 application. Nasal BID  ? pantoprazole  40 mg Oral Daily  ? [START ON 03/24/2022] phenylephrine  30-200 mcg/min Intravenous To OR  ? polyethylene glycol  17 g Oral Daily  ? [START ON 03/24/2022] potassium chloride  80 mEq Other To OR  ? psyllium  1 packet Oral Daily  ? sodium chloride flush  3 mL Intravenous Q12H  ? spironolactone  25 mg Oral Daily  ? [START ON 03/24/2022] tranexamic acid  15 mg/kg Intravenous To OR  ? [START ON 03/24/2022] tranexamic acid  2 mg/kg Intracatheter To OR  ? ?Continuous Infusions: ? sodium chloride    ? sodium chloride    ? [START ON 03/24/2022]  ceFAZolin (ANCEF) IV    ? [START ON 03/24/2022]  ceFAZolin (ANCEF) IV    ? [START ON 03/24/2022] dexmedetomidine    ? [START ON 03/24/2022] heparin 30,000 units/NS 1000 mL solution for CELLSAVER    ? heparin 2,250 Units/hr (03/23/22 2045)  ? [START ON 03/24/2022] milrinone    ? [START ON 03/24/2022] nitroGLYCERIN    ? [START ON 03/24/2022] norepinephrine    ? [START ON 03/24/2022] tranexamic acid (CYKLOKAPRON) infusion ([facility])    ? [START ON 03/24/2022] vancomycin    ? ?PRN Meds:.sodium chloride, sodium chloride, acetaminophen, bisacodyl, bisacodyl, calcium carbonate, diphenhydrAMINE, levalbuterol, magnesium hydroxide, Muscle Rub, nitroGLYCERIN, ondansetron (ZOFRAN) IV, oxyCODONE, sodium chloride flush, sodium chloride flush,  temazepam, zolpidem ? ?Assessment/Plan: ? Acute coronary syndrome ?Multivessel CAD ?S/P attempted PCI to RCA ?HTN ?HLD ?Morbid obesity ?Paroxysmal atrial fibrillation ?Constipation ? ?Plan: ?Awaiting CABG in AM. ?Increase carvedilol as needed. ? ? LOS: 9 days  ? ?Time spent including chart review, lab review, examination, discussion with patient : 30 min ? ? ?[name]  MD  ?03/23/2022, 11:25 PM ? ? ?  ?

## 2022-03-23 NOTE — Progress Notes (Signed)
Procedure(s) (LRB): ?CORONARY ARTERY BYPASS GRAFTING (CABG) (N/A) ?TRANSESOPHAGEAL ECHOCARDIOGRAM (TEE) (N/A) ?Subjective: ?Patient has been stable over the weekend with isolated episodes of angina relieved by sublingual nitroglycerin-usually occurring at night when he gets up to use the restroom. ? ?White count has mildly increased over the weekend.  UA is negative, repeat chest x-ray today is reviewed showing changes of COPD no evidence of bronchitis.  No symptoms of phlebitis IV site inflammation or skin breakdown on exam.  He has had no fever. ? ?Objective: ?Vital signs in last 24 hours: ?Temp:  [97.8 ?F (36.6 ?C)-98.6 ?F (37 ?C)] 98.6 ?F (37 ?C) (04/10 1415) ?Pulse Rate:  [76-98] 98 (04/10 1415) ?Cardiac Rhythm: Atrial fibrillation (04/10 0845) ?Resp:  [15-20] 15 (04/10 1415) ?BP: (107-159)/(34-91) 115/83 (04/10 1415) ?SpO2:  [92 %-98 %] 92 % (04/10 0803) ?Weight:  [138.9 kg] 138.9 kg (04/10 0520) ? ?Hemodynamic parameters for last 24 hours: ?Sinus rhythm with isolated episodes of atrial fibrillation ? ?Intake/Output from previous day: ?04/09 0701 - 04/10 0700 ?In: 2817.9 [P.O.:960; I.V.:1857.9] ?Out: 4550 [Urine:4550] ?Intake/Output this shift: ?Total I/O ?In: 46.4 [I.V.:46.4] ?Out: 375 [Urine:375] ? ?Alert and comfortable ?Lungs with scattered rhonchi ?Stable minimal ankle edema ?No murmur ? ?Lab Results: ?Recent Labs  ?  03/21/22 ?0230 03/23/22 ?0505  ?WBC 12.2* 16.4*  ?HGB 13.2 13.5  ?HCT 40.2 41.2  ?PLT 219 252  ? ?BMET:  ?Recent Labs  ?  03/22/22 ?0231 03/23/22 ?0505  ?NA 136 136  ?K 4.6 4.5  ?CL 104 100  ?CO2 26 28  ?GLUCOSE 111* 120*  ?BUN 12 9  ?CREATININE 0.53* 0.71  ?CALCIUM 9.0 9.5  ?  ?PT/INR:  ?Recent Labs  ?  03/23/22 ?0505  ?LABPROT 13.8  ?INR 1.1  ? ?ABG ?   ?Component Value Date/Time  ? PHART 7.38 03/20/2022 0433  ? HCO3 29.6 (H) 03/20/2022 0433  ? TCO2 24 04/06/2015 1803  ? O2SAT 95.7 03/20/2022 0433  ? ?CBG (last 3)  ?No results for input(s): GLUCAP in the last 72  hours. ? ?Assessment/Plan: ?S/P Procedure(s) (LRB): ?CORONARY ARTERY BYPASS GRAFTING (CABG) (N/A) ?TRANSESOPHAGEAL ECHOCARDIOGRAM (TEE) (N/A) ?Multivessel increased risk CABG in a.m. ?Patient's risk for surgery mainly related to his COPD, nocturnal home O2 requirement, and emphysematous changes noted on CT scan.  Bedside PFTs show moderate deficit in both FVC and FEV1.  Diffusion capacity performed 2021 showed moderate deficit.  Preoperative ABG shows PCO2 50 at baseline. ? ?Patient is not a candidate for combined Maze procedure due to severe COPD and the necessity to reduce pump time to help minimize pulmonary inflammation associated with CABG.  We discussed a possible left atrial clip. ? LOS: 9 days  ? ? ?Arthur Church ?03/23/2022 ?  ?

## 2022-03-23 NOTE — Care Management Important Message (Signed)
Important Message ? ?Patient Details  ?Name: Arthur Church ?MRN: [id number] ?Date of Birth: 1956/09/16 ? ? ?Medicare Important Message Given:  Yes ? ? ? ? ?Renie Ora ?03/23/2022, 9:09 AM ?

## 2022-03-23 NOTE — Progress Notes (Addendum)
Pt noted to be in afib, which pt was unaware of. Rate 100s although apparently up to 150 walking. No c/o. He is surprised, sts he hasn't had afib in 5 years. He is getting ready to walk again. Reviewed sternal precautions. He has been practicing IS.  ?[id number] ?Ethelda Chick CES, ACSM ?3:17 PM ?03/23/2022 ? ?

## 2022-03-23 NOTE — Progress Notes (Signed)
Pt c/o left shoulder pain 9/10 after standing to use the urinal.  Pt received complete relief with 2 SL ntg. Pt currently resting without c/o.  VSS. ?

## 2022-03-23 NOTE — Progress Notes (Signed)
ANTICOAGULATION CONSULT NOTE ? ?Pharmacy Consult for Heparin ?Indication: chest pain/ACS ? ?No Known Allergies ? ?Patient Measurements: ?Height: 6\' 1"  (185.4 cm) ?Weight: (!) 138.9 kg (306 lb 3.2 oz) ?IBW/kg (Calculated) : 79.9 ?Heparin Dosing Weight: 112 kg ? ?Vital Signs: ?Temp: 97.8 ?F (36.6 ?C) (04/10 0520) ?Temp Source: Oral (04/10 0520) ?BP: 107/34 (04/10 0520) ?Pulse Rate: 83 (04/10 0520) ? ?Labs: ?Recent Labs  ?  03/21/22 ?0230 03/22/22 ?0231 03/22/22 ?0234 03/23/22 ?0505  ?HGB 13.2  --   --  13.5  ?HCT 40.2  --   --  41.2  ?PLT 219  --   --  252  ?APTT  --   --   --  98*  ?LABPROT  --   --   --  13.8  ?INR  --   --   --  1.1  ?HEPARINUNFRC 0.47  --  0.30 0.42  ?CREATININE 0.66 0.53*  --  0.71  ? ? ?Estimated Creatinine Clearance: 136.6 mL/min (by C-G formula based on SCr of 0.71 mg/dL). ? ? ?Assessment: ?66 years old white male with PMH of CHF, obesity, atrial fibrillation on Eliquis PTA has intermittent chest pain with radiation to both shoulders. Cath found 2 vessel disease with inability to place percutaneous stent.  Last Eliquis dose pm on 3/31. Pharmacy consulted for heparin to start after TR band removal.  ? ?Heparin level 0.42 is therapeutic on 2250 units/hr. H/H, plt stable. Planning CABG 4/11.  ? ? ?Goal of Therapy:  ?Heparin level 0.3-0.7 units/mL ?Monitor platelets by anticoagulation protocol: Yes ?  ?Plan:  ?Continue heparin at 2250 units/hr ?Daily CBC and heparin level ?F/U anticoagulation plans after CABG on 4/11 ? ?6/11, PharmD, BCPS, BCCP ?Clinical Pharmacist ? ?Please check AMION for all [facility] phone numbers ?After 10:00 PM, call Main Pharmacy [phone] ? ?

## 2022-03-24 ENCOUNTER — Inpatient Hospital Stay (HOSPITAL_COMMUNITY): Payer: Medicare (Managed Care) | Admitting: Anesthesiology

## 2022-03-24 ENCOUNTER — Inpatient Hospital Stay (HOSPITAL_COMMUNITY): Payer: Medicare (Managed Care)

## 2022-03-24 ENCOUNTER — Inpatient Hospital Stay (HOSPITAL_COMMUNITY)
Admission: EM | Disposition: A | Discharge status: Home Health | Payer: Self-pay | Source: Home / Self Care | Attending: Cardiovascular Disease

## 2022-03-24 ENCOUNTER — Encounter (HOSPITAL_COMMUNITY): Payer: Self-pay | Admitting: Cardiovascular Disease

## 2022-03-24 ENCOUNTER — Other Ambulatory Visit: Payer: Self-pay

## 2022-03-24 DIAGNOSIS — Z951 Presence of aortocoronary bypass graft: Secondary | ICD-10-CM

## 2022-03-24 DIAGNOSIS — I5032 Chronic diastolic (congestive) heart failure: Secondary | ICD-10-CM

## 2022-03-24 DIAGNOSIS — I110 Hypertensive heart disease with heart failure: Secondary | ICD-10-CM

## 2022-03-24 DIAGNOSIS — I252 Old myocardial infarction: Secondary | ICD-10-CM

## 2022-03-24 DIAGNOSIS — I2510 Atherosclerotic heart disease of native coronary artery without angina pectoris: Secondary | ICD-10-CM

## 2022-03-24 HISTORY — PX: TEE WITHOUT CARDIOVERSION: SHX5443

## 2022-03-24 HISTORY — PX: CORONARY ARTERY BYPASS GRAFT: SHX141

## 2022-03-24 HISTORY — PX: ENDOVEIN HARVEST OF GREATER SAPHENOUS VEIN: SHX5059

## 2022-03-24 LAB — POCT I-STAT 7, (LYTES, BLD GAS, ICA,H+H)
Acid-Base Excess: 4 mmol/L — ABNORMAL HIGH (ref 0.0–2.0)
Acid-Base Excess: 3 mmol/L — ABNORMAL HIGH (ref 0.0–2.0)
Acid-Base Excess: 4 mmol/L — ABNORMAL HIGH (ref 0.0–2.0)
Acid-Base Excess: 3 mmol/L — ABNORMAL HIGH (ref 0.0–2.0)
Acid-Base Excess: 3 mmol/L — ABNORMAL HIGH (ref 0.0–2.0)
Acid-Base Excess: 0 mmol/L (ref 0.0–2.0)
Acid-base deficit: 1 mmol/L (ref 0.0–2.0)
Acid-base deficit: 1 mmol/L (ref 0.0–2.0)
Bicarbonate: 31 mmol/L — ABNORMAL HIGH (ref 20.0–28.0)
Bicarbonate: 29.2 mmol/L — ABNORMAL HIGH (ref 20.0–28.0)
Bicarbonate: 28.9 mmol/L — ABNORMAL HIGH (ref 20.0–28.0)
Bicarbonate: 28 mmol/L (ref 20.0–28.0)
Bicarbonate: 29.6 mmol/L — ABNORMAL HIGH (ref 20.0–28.0)
Bicarbonate: 26.3 mmol/L (ref 20.0–28.0)
Bicarbonate: 24.1 mmol/L (ref 20.0–28.0)
Bicarbonate: 23.7 mmol/L (ref 20.0–28.0)
Calcium, Ion: 1.29 mmol/L (ref 1.15–1.40)
Calcium, Ion: 1.11 mmol/L — ABNORMAL LOW (ref 1.15–1.40)
Calcium, Ion: 1.13 mmol/L — ABNORMAL LOW (ref 1.15–1.40)
Calcium, Ion: 1.17 mmol/L (ref 1.15–1.40)
Calcium, Ion: 1.21 mmol/L (ref 1.15–1.40)
Calcium, Ion: 1.19 mmol/L (ref 1.15–1.40)
Calcium, Ion: 1.22 mmol/L (ref 1.15–1.40)
Calcium, Ion: 1.16 mmol/L (ref 1.15–1.40)
HCT: 43 % (ref 39.0–52.0)
HCT: 33 % — ABNORMAL LOW (ref 39.0–52.0)
HCT: 35 % — ABNORMAL LOW (ref 39.0–52.0)
HCT: 36 % — ABNORMAL LOW (ref 39.0–52.0)
HCT: 38 % — ABNORMAL LOW (ref 39.0–52.0)
HCT: 39 % (ref 39.0–52.0)
HCT: 38 % — ABNORMAL LOW (ref 39.0–52.0)
HCT: 38 % — ABNORMAL LOW (ref 39.0–52.0)
Hemoglobin: 14.6 g/dL (ref 13.0–17.0)
Hemoglobin: 11.2 g/dL — ABNORMAL LOW (ref 13.0–17.0)
Hemoglobin: 11.9 g/dL — ABNORMAL LOW (ref 13.0–17.0)
Hemoglobin: 12.2 g/dL — ABNORMAL LOW (ref 13.0–17.0)
Hemoglobin: 12.9 g/dL — ABNORMAL LOW (ref 13.0–17.0)
Hemoglobin: 13.3 g/dL (ref 13.0–17.0)
Hemoglobin: 12.9 g/dL — ABNORMAL LOW (ref 13.0–17.0)
Hemoglobin: 12.9 g/dL — ABNORMAL LOW (ref 13.0–17.0)
O2 Saturation: 100 %
O2 Saturation: 100 %
O2 Saturation: 100 %
O2 Saturation: 100 %
O2 Saturation: 99 %
O2 Saturation: 97 %
O2 Saturation: 98 %
O2 Saturation: 96 %
Patient temperature: 36.2
Patient temperature: 36.8
Patient temperature: 38.6
Potassium: 4 mmol/L (ref 3.5–5.1)
Potassium: 4.2 mmol/L (ref 3.5–5.1)
Potassium: 4.9 mmol/L (ref 3.5–5.1)
Potassium: 4.2 mmol/L (ref 3.5–5.1)
Potassium: 4.5 mmol/L (ref 3.5–5.1)
Potassium: 3.9 mmol/L (ref 3.5–5.1)
Potassium: 4.1 mmol/L (ref 3.5–5.1)
Potassium: 4 mmol/L (ref 3.5–5.1)
Sodium: 135 mmol/L (ref 135–145)
Sodium: 136 mmol/L (ref 135–145)
Sodium: 133 mmol/L — ABNORMAL LOW (ref 135–145)
Sodium: 135 mmol/L (ref 135–145)
Sodium: 135 mmol/L (ref 135–145)
Sodium: 137 mmol/L (ref 135–145)
Sodium: 135 mmol/L (ref 135–145)
Sodium: 136 mmol/L (ref 135–145)
TCO2: 33 mmol/L — ABNORMAL HIGH (ref 22–32)
TCO2: 31 mmol/L (ref 22–32)
TCO2: 30 mmol/L (ref 22–32)
TCO2: 29 mmol/L (ref 22–32)
TCO2: 31 mmol/L (ref 22–32)
TCO2: 28 mmol/L (ref 22–32)
TCO2: 25 mmol/L (ref 22–32)
TCO2: 25 mmol/L (ref 22–32)
pCO2 arterial: 58 mmHg — ABNORMAL HIGH (ref 32–48)
pCO2 arterial: 50.2 mmHg — ABNORMAL HIGH (ref 32–48)
pCO2 arterial: 41.8 mmHg (ref 32–48)
pCO2 arterial: 41.4 mmHg (ref 32–48)
pCO2 arterial: 55.7 mmHg — ABNORMAL HIGH (ref 32–48)
pCO2 arterial: 44.8 mmHg (ref 32–48)
pCO2 arterial: 38.5 mmHg (ref 32–48)
pCO2 arterial: 42.1 mmHg (ref 32–48)
pH, Arterial: 7.336 — ABNORMAL LOW (ref 7.35–7.45)
pH, Arterial: 7.372 (ref 7.35–7.45)
pH, Arterial: 7.447 (ref 7.35–7.45)
pH, Arterial: 7.334 — ABNORMAL LOW (ref 7.35–7.45)
pH, Arterial: 7.438 (ref 7.35–7.45)
pH, Arterial: 7.373 (ref 7.35–7.45)
pH, Arterial: 7.405 (ref 7.35–7.45)
pH, Arterial: 7.365 (ref 7.35–7.45)
pO2, Arterial: 372 mmHg — ABNORMAL HIGH (ref 83–108)
pO2, Arterial: 352 mmHg — ABNORMAL HIGH (ref 83–108)
pO2, Arterial: 216 mmHg — ABNORMAL HIGH (ref 83–108)
pO2, Arterial: 120 mmHg — ABNORMAL HIGH (ref 83–108)
pO2, Arterial: 335 mmHg — ABNORMAL HIGH (ref 83–108)
pO2, Arterial: 86 mmHg (ref 83–108)
pO2, Arterial: 98 mmHg (ref 83–108)
pO2, Arterial: 91 mmHg (ref 83–108)

## 2022-03-24 LAB — POCT I-STAT, CHEM 8
BUN: 10 mg/dL (ref 8–23)
BUN: 10 mg/dL (ref 8–23)
BUN: 9 mg/dL (ref 8–23)
BUN: 10 mg/dL (ref 8–23)
Calcium, Ion: 1.29 mmol/L (ref 1.15–1.40)
Calcium, Ion: 1.29 mmol/L (ref 1.15–1.40)
Calcium, Ion: 1.18 mmol/L (ref 1.15–1.40)
Calcium, Ion: 1.22 mmol/L (ref 1.15–1.40)
Chloride: 96 mmol/L — ABNORMAL LOW (ref 98–111)
Chloride: 98 mmol/L (ref 98–111)
Chloride: 97 mmol/L — ABNORMAL LOW (ref 98–111)
Chloride: 97 mmol/L — ABNORMAL LOW (ref 98–111)
Creatinine, Ser: 0.4 mg/dL — ABNORMAL LOW (ref 0.61–1.24)
Creatinine, Ser: 0.4 mg/dL — ABNORMAL LOW (ref 0.61–1.24)
Creatinine, Ser: 0.4 mg/dL — ABNORMAL LOW (ref 0.61–1.24)
Creatinine, Ser: 0.4 mg/dL — ABNORMAL LOW (ref 0.61–1.24)
Glucose, Bld: 138 mg/dL — ABNORMAL HIGH (ref 70–99)
Glucose, Bld: 122 mg/dL — ABNORMAL HIGH (ref 70–99)
Glucose, Bld: 130 mg/dL — ABNORMAL HIGH (ref 70–99)
Glucose, Bld: 186 mg/dL — ABNORMAL HIGH (ref 70–99)
HCT: 43 % (ref 39.0–52.0)
HCT: 41 % (ref 39.0–52.0)
HCT: 36 % — ABNORMAL LOW (ref 39.0–52.0)
HCT: 38 % — ABNORMAL LOW (ref 39.0–52.0)
Hemoglobin: 14.6 g/dL (ref 13.0–17.0)
Hemoglobin: 13.9 g/dL (ref 13.0–17.0)
Hemoglobin: 12.2 g/dL — ABNORMAL LOW (ref 13.0–17.0)
Hemoglobin: 12.9 g/dL — ABNORMAL LOW (ref 13.0–17.0)
Potassium: 4 mmol/L (ref 3.5–5.1)
Potassium: 4.4 mmol/L (ref 3.5–5.1)
Potassium: 4.3 mmol/L (ref 3.5–5.1)
Potassium: 4.2 mmol/L (ref 3.5–5.1)
Sodium: 136 mmol/L (ref 135–145)
Sodium: 135 mmol/L (ref 135–145)
Sodium: 136 mmol/L (ref 135–145)
Sodium: 136 mmol/L (ref 135–145)
TCO2: 31 mmol/L (ref 22–32)
TCO2: 29 mmol/L (ref 22–32)
TCO2: 29 mmol/L (ref 22–32)
TCO2: 29 mmol/L (ref 22–32)

## 2022-03-24 LAB — CBC
HCT: 44.5 % (ref 39.0–52.0)
HCT: 38.2 % — ABNORMAL LOW (ref 39.0–52.0)
HCT: 38.6 % — ABNORMAL LOW (ref 39.0–52.0)
Hemoglobin: 14.9 g/dL (ref 13.0–17.0)
Hemoglobin: 13 g/dL (ref 13.0–17.0)
Hemoglobin: 12.9 g/dL — ABNORMAL LOW (ref 13.0–17.0)
MCH: 28.3 pg (ref 26.0–34.0)
MCH: 29.2 pg (ref 26.0–34.0)
MCH: 28.6 pg (ref 26.0–34.0)
MCHC: 33.5 g/dL (ref 30.0–36.0)
MCHC: 34 g/dL (ref 30.0–36.0)
MCHC: 33.4 g/dL (ref 30.0–36.0)
MCV: 84.4 fL (ref 80.0–100.0)
MCV: 85.8 fL (ref 80.0–100.0)
MCV: 85.6 fL (ref 80.0–100.0)
Platelets: 259 10*3/uL (ref 150–400)
Platelets: 235 10*3/uL (ref 150–400)
Platelets: 246 10*3/uL (ref 150–400)
RBC: 5.27 MIL/uL (ref 4.22–5.81)
RBC: 4.45 MIL/uL (ref 4.22–5.81)
RBC: 4.51 MIL/uL (ref 4.22–5.81)
RDW: 14.8 % (ref 11.5–15.5)
RDW: 14.8 % (ref 11.5–15.5)
RDW: 14.8 % (ref 11.5–15.5)
WBC: 13.9 10*3/uL — ABNORMAL HIGH (ref 4.0–10.5)
WBC: 29.9 10*3/uL — ABNORMAL HIGH (ref 4.0–10.5)
WBC: 20.8 10*3/uL — ABNORMAL HIGH (ref 4.0–10.5)
nRBC: 0 % (ref 0.0–0.2)
nRBC: 0 % (ref 0.0–0.2)
nRBC: 0 % (ref 0.0–0.2)

## 2022-03-24 LAB — GLUCOSE, CAPILLARY
Glucose-Capillary: 179 mg/dL — ABNORMAL HIGH (ref 70–99)
Glucose-Capillary: 172 mg/dL — ABNORMAL HIGH (ref 70–99)
Glucose-Capillary: 142 mg/dL — ABNORMAL HIGH (ref 70–99)
Glucose-Capillary: 143 mg/dL — ABNORMAL HIGH (ref 70–99)
Glucose-Capillary: 147 mg/dL — ABNORMAL HIGH (ref 70–99)
Glucose-Capillary: 175 mg/dL — ABNORMAL HIGH (ref 70–99)
Glucose-Capillary: 163 mg/dL — ABNORMAL HIGH (ref 70–99)
Glucose-Capillary: 165 mg/dL — ABNORMAL HIGH (ref 70–99)

## 2022-03-24 LAB — ECHO INTRAOPERATIVE TEE
AR max vel: 2.09 cm2
AV Area VTI: 2.25 cm2
AV Area mean vel: 0.77 cm2
AV Mean grad: 2 mmHg
AV Peak grad: 4.2 mmHg
Ao pk vel: 1.03 m/s
Height: 73 in
Radius: 0.4 cm
S' Lateral: 3.47 cm
Weight: 4913.6 oz

## 2022-03-24 LAB — POCT I-STAT EG7
Acid-Base Excess: 2 mmol/L (ref 0.0–2.0)
Bicarbonate: 28.4 mmol/L — ABNORMAL HIGH (ref 20.0–28.0)
Calcium, Ion: 1.14 mmol/L — ABNORMAL LOW (ref 1.15–1.40)
HCT: 34 % — ABNORMAL LOW (ref 39.0–52.0)
Hemoglobin: 11.6 g/dL — ABNORMAL LOW (ref 13.0–17.0)
O2 Saturation: 87 %
Potassium: 4 mmol/L (ref 3.5–5.1)
Sodium: 136 mmol/L (ref 135–145)
TCO2: 30 mmol/L (ref 22–32)
pCO2, Ven: 51.4 mmHg (ref 44–60)
pH, Ven: 7.35 (ref 7.25–7.43)
pO2, Ven: 56 mmHg — ABNORMAL HIGH (ref 32–45)

## 2022-03-24 LAB — PREPARE PLATELET PHERESIS
Unit division: 0
Unit division: 0
Unit division: 0

## 2022-03-24 LAB — HEMOGLOBIN AND HEMATOCRIT, BLOOD
HCT: 35.6 % — ABNORMAL LOW (ref 39.0–52.0)
Hemoglobin: 12.2 g/dL — ABNORMAL LOW (ref 13.0–17.0)

## 2022-03-24 LAB — COOXEMETRY PANEL
Carboxyhemoglobin: 1 % (ref 0.5–1.5)
Methemoglobin: <0.7 % (ref 0.0–1.5)
O2 Saturation: 83.9 %
Total hemoglobin: 12.2 g/dL (ref 12.0–16.0)

## 2022-03-24 LAB — BPAM PLATELET PHERESIS
Blood Product Expiration Date: 202304132359
Blood Product Expiration Date: 202304132359
Blood Product Expiration Date: 202304142359
ISSUE DATE / TIME: 202304111244
Unit Type and Rh: 5100
Unit Type and Rh: 6200
Unit Type and Rh: 7300

## 2022-03-24 LAB — MAGNESIUM
Magnesium: 1.8 mg/dL (ref 1.7–2.4)
Magnesium: 2.1 mg/dL (ref 1.7–2.4)

## 2022-03-24 LAB — BASIC METABOLIC PANEL
Anion gap: 8 (ref 5–15)
Anion gap: 8 (ref 5–15)
BUN: 12 mg/dL (ref 8–23)
BUN: 9 mg/dL (ref 8–23)
CO2: 27 mmol/L (ref 22–32)
CO2: 24 mmol/L (ref 22–32)
Calcium: 9.8 mg/dL (ref 8.9–10.3)
Calcium: 8.6 mg/dL — ABNORMAL LOW (ref 8.9–10.3)
Chloride: 99 mmol/L (ref 98–111)
Chloride: 104 mmol/L (ref 98–111)
Creatinine, Ser: 0.74 mg/dL (ref 0.61–1.24)
Creatinine, Ser: 0.63 mg/dL (ref 0.61–1.24)
GFR, Estimated: >60 mL/min (ref >60)
GFR, Estimated: >60 mL/min (ref >60)
Glucose, Bld: 132 mg/dL — ABNORMAL HIGH (ref 70–99)
Glucose, Bld: 161 mg/dL — ABNORMAL HIGH (ref 70–99)
Potassium: 4.8 mmol/L (ref 3.5–5.1)
Potassium: 4.1 mmol/L (ref 3.5–5.1)
Sodium: 134 mmol/L — ABNORMAL LOW (ref 135–145)
Sodium: 136 mmol/L (ref 135–145)

## 2022-03-24 LAB — PREPARE RBC (CROSSMATCH)

## 2022-03-24 LAB — PROTIME-INR
INR: 1.3 — ABNORMAL HIGH (ref 0.8–1.2)
Prothrombin Time: 15.9 seconds — ABNORMAL HIGH (ref 11.4–15.2)

## 2022-03-24 LAB — HEPARIN LEVEL (UNFRACTIONATED): Heparin Unfractionated: 0.36 IU/mL (ref 0.30–0.70)

## 2022-03-24 LAB — PLATELET COUNT: Platelets: 275 10*3/uL (ref 150–400)

## 2022-03-24 LAB — APTT: aPTT: 34 seconds (ref 24–36)

## 2022-03-24 SURGERY — CORONARY ARTERY BYPASS GRAFTING (CABG)
Anesthesia: General | Site: Leg Lower | Laterality: Right

## 2022-03-24 MED ORDER — DEXMEDETOMIDINE HCL IN NACL 400 MCG/100ML IV SOLN
0.0000 ug/kg/h | INTRAVENOUS | Status: DC
  Administered 2022-03-24: 0.7 ug/kg/h via INTRAVENOUS
  Filled 2022-03-24 (×2): qty 100

## 2022-03-24 MED ORDER — ROCURONIUM BROMIDE 10 MG/ML (PF) SYRINGE
PREFILLED_SYRINGE | INTRAVENOUS | Status: DC | PRN
  Administered 2022-03-24: 70 mg via INTRAVENOUS
  Administered 2022-03-24 (×2): 30 mg via INTRAVENOUS
  Administered 2022-03-24: 50 mg via INTRAVENOUS
  Administered 2022-03-24: 40 mg via INTRAVENOUS
  Administered 2022-03-24: 60 mg via INTRAVENOUS

## 2022-03-24 MED ORDER — ACETAMINOPHEN 500 MG PO TABS
1000.0000 mg | ORAL_TABLET | Freq: Four times a day (QID) | ORAL | Status: AC
Start: 2022-03-25 — End: 2022-03-29
  Administered 2022-03-24 – 2022-03-29 (×17): 1000 mg via ORAL
  Filled 2022-03-24 (×17): qty 2

## 2022-03-24 MED ORDER — DEXTROSE 50 % IV SOLN: 0.0000 mL | INTRAVENOUS | Status: DC | PRN

## 2022-03-24 MED ORDER — ONDANSETRON HCL 4 MG/2ML IJ SOLN
4.0000 mg | Freq: Four times a day (QID) | INTRAMUSCULAR | Status: DC | PRN
  Administered 2022-03-25 – 2022-03-26 (×2): 4 mg via INTRAVENOUS
  Filled 2022-03-24 (×3): qty 2

## 2022-03-24 MED ORDER — HEPARIN SODIUM (PORCINE) 1000 UNIT/ML IJ SOLN
INTRAMUSCULAR | Status: DC | PRN
  Administered 2022-03-24: 3000 [IU] via INTRAVENOUS
  Administered 2022-03-24: 45000 [IU] via INTRAVENOUS

## 2022-03-24 MED ORDER — NOREPINEPHRINE 4 MG/250ML-% IV SOLN
3.0000 ug/min | INTRAVENOUS | Status: DC
  Filled 2022-03-24: qty 250

## 2022-03-24 MED ORDER — METOPROLOL TARTRATE 5 MG/5ML IV SOLN
2.5000 mg | INTRAVENOUS | Status: DC | PRN
  Administered 2022-03-25 (×3): 5 mg via INTRAVENOUS
  Filled 2022-03-24 (×3): qty 5

## 2022-03-24 MED ORDER — SODIUM CHLORIDE 0.9 % IV SOLN
10.0000 mL/h | Freq: Once | INTRAVENOUS | Status: DC
Start: 2022-03-24 — End: 2022-03-24

## 2022-03-24 MED ORDER — ASPIRIN EC 325 MG PO TBEC
325.0000 mg | DELAYED_RELEASE_TABLET | Freq: Every day | ORAL | Status: DC
  Administered 2022-03-25 – 2022-03-27 (×3): 325 mg via ORAL
  Filled 2022-03-24 (×3): qty 1

## 2022-03-24 MED ORDER — ACETAMINOPHEN 160 MG/5ML PO SOLN: 650.0000 mg | Freq: Once | ORAL | Status: AC

## 2022-03-24 MED ORDER — ASPIRIN 81 MG PO CHEW: 324.0000 mg | CHEWABLE_TABLET | Freq: Every day | ORAL | Status: DC

## 2022-03-24 MED ORDER — SODIUM CHLORIDE 0.9% FLUSH: 3.0000 mL | INTRAVENOUS | Status: DC | PRN

## 2022-03-24 MED ORDER — TRAMADOL HCL 50 MG PO TABS
50.0000 mg | ORAL_TABLET | ORAL | Status: DC | PRN
  Administered 2022-03-25: 100 mg via ORAL
  Administered 2022-03-25: 50 mg via ORAL
  Administered 2022-03-25 – 2022-03-30 (×5): 100 mg via ORAL
  Filled 2022-03-24: qty 1
  Filled 2022-03-24 (×6): qty 2

## 2022-03-24 MED ORDER — MILRINONE LACTATE IN DEXTROSE 20-5 MG/100ML-% IV SOLN
0.1250 ug/kg/min | INTRAVENOUS | Status: DC
  Administered 2022-03-24: 0.3 ug/kg/min via INTRAVENOUS
  Administered 2022-03-25 (×2): 0.25 ug/kg/min via INTRAVENOUS
  Administered 2022-03-25: 0.3 ug/kg/min via INTRAVENOUS
  Administered 2022-03-26: 0.25 ug/kg/min via INTRAVENOUS
  Administered 2022-03-26 – 2022-03-28 (×3): 0.125 ug/kg/min via INTRAVENOUS
  Filled 2022-03-24 (×8): qty 100

## 2022-03-24 MED ORDER — LACTATED RINGERS IV SOLN: INTRAVENOUS | Status: DC | PRN

## 2022-03-24 MED ORDER — BISACODYL 5 MG PO TBEC
10.0000 mg | DELAYED_RELEASE_TABLET | Freq: Every day | ORAL | Status: DC
  Administered 2022-03-25 – 2022-04-01 (×5): 10 mg via ORAL
  Filled 2022-03-24 (×7): qty 2

## 2022-03-24 MED ORDER — NITROGLYCERIN IN D5W 200-5 MCG/ML-% IV SOLN: 0.0000 ug/min | INTRAVENOUS | Status: DC

## 2022-03-24 MED ORDER — PROPOFOL 10 MG/ML IV BOLUS
INTRAVENOUS | Status: AC
  Filled 2022-03-24: qty 20

## 2022-03-24 MED ORDER — MORPHINE SULFATE (PF) 2 MG/ML IV SOLN
1.0000 mg | INTRAVENOUS | Status: DC | PRN
  Administered 2022-03-24: 1 mg via INTRAVENOUS
  Administered 2022-03-24: 2 mg via INTRAVENOUS
  Filled 2022-03-24 (×3): qty 1

## 2022-03-24 MED ORDER — DEXAMETHASONE SODIUM PHOSPHATE 10 MG/ML IJ SOLN
INTRAMUSCULAR | Status: DC | PRN
  Administered 2022-03-24: 10 mg via INTRAVENOUS

## 2022-03-24 MED ORDER — SODIUM CHLORIDE 0.9% FLUSH: 10.0000 mL | INTRAVENOUS | Status: DC | PRN

## 2022-03-24 MED ORDER — BISACODYL 10 MG RE SUPP
10.0000 mg | Freq: Every day | RECTAL | Status: DC
  Filled 2022-03-24: qty 1

## 2022-03-24 MED ORDER — AMIODARONE HCL IN DEXTROSE 360-4.14 MG/200ML-% IV SOLN
60.0000 mg/h | INTRAVENOUS | Status: AC
Start: 2022-03-24 — End: 2022-03-24
  Administered 2022-03-24: 60 mg/h via INTRAVENOUS
  Filled 2022-03-24 (×3): qty 200

## 2022-03-24 MED ORDER — TRANEXAMIC ACID 1000 MG/10ML IV SOLN
1.5000 mg/kg/h | INTRAVENOUS | Status: DC
  Filled 2022-03-24: qty 25

## 2022-03-24 MED ORDER — SODIUM CHLORIDE 0.9 % IV SOLN: INTRAVENOUS | Status: DC

## 2022-03-24 MED ORDER — LEVALBUTEROL HCL 1.25 MG/0.5ML IN NEBU
1.2500 mg | INHALATION_SOLUTION | Freq: Four times a day (QID) | RESPIRATORY_TRACT | Status: DC
  Administered 2022-03-24 – 2022-03-25 (×4): 1.25 mg via RESPIRATORY_TRACT
  Filled 2022-03-24 (×4): qty 0.5

## 2022-03-24 MED ORDER — PROPOFOL 10 MG/ML IV BOLUS
INTRAVENOUS | Status: DC | PRN
  Administered 2022-03-24: 30 mg via INTRAVENOUS
  Administered 2022-03-24: 140 mg via INTRAVENOUS

## 2022-03-24 MED ORDER — METOPROLOL TARTRATE 12.5 MG HALF TABLET
12.5000 mg | ORAL_TABLET | Freq: Two times a day (BID) | ORAL | Status: DC
  Administered 2022-03-25: 12.5 mg via ORAL
  Filled 2022-03-24: qty 1

## 2022-03-24 MED ORDER — MIDAZOLAM HCL (PF) 5 MG/ML IJ SOLN
INTRAMUSCULAR | Status: DC | PRN
  Administered 2022-03-24 (×3): 1 mg via INTRAVENOUS
  Administered 2022-03-24 (×2): 2 mg via INTRAVENOUS
  Administered 2022-03-24: 1 mg via INTRAVENOUS
  Administered 2022-03-24: 2 mg via INTRAVENOUS

## 2022-03-24 MED ORDER — MIDAZOLAM HCL (PF) 10 MG/2ML IJ SOLN
INTRAMUSCULAR | Status: AC
  Filled 2022-03-24: qty 2

## 2022-03-24 MED ORDER — DOCUSATE SODIUM 100 MG PO CAPS
200.0000 mg | ORAL_CAPSULE | Freq: Every day | ORAL | Status: DC
  Administered 2022-03-25 – 2022-04-01 (×5): 200 mg via ORAL
  Filled 2022-03-24 (×8): qty 2

## 2022-03-24 MED ORDER — FENTANYL CITRATE (PF) 250 MCG/5ML IJ SOLN
INTRAMUSCULAR | Status: DC | PRN
  Administered 2022-03-24: 100 ug via INTRAVENOUS
  Administered 2022-03-24: 50 ug via INTRAVENOUS
  Administered 2022-03-24 (×2): 150 ug via INTRAVENOUS
  Administered 2022-03-24: 50 ug via INTRAVENOUS
  Administered 2022-03-24: 150 ug via INTRAVENOUS
  Administered 2022-03-24: 100 ug via INTRAVENOUS

## 2022-03-24 MED ORDER — AMIODARONE HCL IN DEXTROSE 360-4.14 MG/200ML-% IV SOLN
30.0000 mg/h | INTRAVENOUS | Status: DC
  Administered 2022-03-24: 30 mg/h via INTRAVENOUS
  Filled 2022-03-24: qty 200

## 2022-03-24 MED ORDER — HEMOSTATIC AGENTS (NO CHARGE) OPTIME
TOPICAL | Status: DC | PRN
  Administered 2022-03-24: 3 via TOPICAL

## 2022-03-24 MED ORDER — LACTATED RINGERS IV SOLN: INTRAVENOUS | Status: DC

## 2022-03-24 MED ORDER — OXYCODONE HCL 5 MG PO TABS
5.0000 mg | ORAL_TABLET | ORAL | Status: DC | PRN
  Administered 2022-03-24 – 2022-04-01 (×14): 10 mg via ORAL
  Filled 2022-03-24 (×14): qty 2
  Filled 2022-03-24: qty 1
  Filled 2022-03-24: qty 2

## 2022-03-24 MED ORDER — PLASMA-LYTE A IV SOLN
INTRAVENOUS | Status: DC | PRN
  Administered 2022-03-24: 500 mL via INTRAVASCULAR

## 2022-03-24 MED ORDER — PHENYLEPHRINE HCL-NACL 20-0.9 MG/250ML-% IV SOLN: 0.0000 ug/min | INTRAVENOUS | Status: DC

## 2022-03-24 MED ORDER — MAGNESIUM SULFATE 4 GM/100ML IV SOLN
4.0000 g | Freq: Once | INTRAVENOUS | Status: AC
  Administered 2022-03-24: 4 g via INTRAVENOUS
  Filled 2022-03-24: qty 100

## 2022-03-24 MED ORDER — MONTELUKAST SODIUM 10 MG PO TABS
10.0000 mg | ORAL_TABLET | Freq: Every day | ORAL | Status: DC
  Administered 2022-03-25 – 2022-03-30 (×6): 10 mg
  Filled 2022-03-24 (×6): qty 1

## 2022-03-24 MED ORDER — VANCOMYCIN HCL IN DEXTROSE 1-5 GM/200ML-% IV SOLN
1000.0000 mg | Freq: Once | INTRAVENOUS | Status: AC
  Administered 2022-03-24: 1000 mg via INTRAVENOUS
  Filled 2022-03-24: qty 200

## 2022-03-24 MED ORDER — SODIUM CHLORIDE 0.9% FLUSH
3.0000 mL | Freq: Two times a day (BID) | INTRAVENOUS | Status: DC
  Administered 2022-03-25 – 2022-03-30 (×7): 3 mL via INTRAVENOUS

## 2022-03-24 MED ORDER — ALBUMIN HUMAN 5 % IV SOLN: INTRAVENOUS | Status: DC | PRN

## 2022-03-24 MED ORDER — SODIUM CHLORIDE 0.9 % IV SOLN: 250.0000 mL | INTRAVENOUS | Status: DC

## 2022-03-24 MED ORDER — SODIUM CHLORIDE 0.9% FLUSH
10.0000 mL | Freq: Two times a day (BID) | INTRAVENOUS | Status: DC
  Administered 2022-03-24 – 2022-03-30 (×8): 10 mL

## 2022-03-24 MED ORDER — ACETAMINOPHEN 160 MG/5ML PO SOLN
1000.0000 mg | Freq: Four times a day (QID) | ORAL | Status: AC
Start: 2022-03-25 — End: 2022-03-29

## 2022-03-24 MED ORDER — FENTANYL CITRATE (PF) 250 MCG/5ML IJ SOLN
INTRAMUSCULAR | Status: AC
  Filled 2022-03-24: qty 5

## 2022-03-24 MED ORDER — CHLORHEXIDINE GLUCONATE CLOTH 2 % EX PADS
6.0000 | MEDICATED_PAD | Freq: Every day | CUTANEOUS | Status: DC
  Administered 2022-03-29 – 2022-04-02 (×4): 6 via TOPICAL

## 2022-03-24 MED ORDER — PANTOPRAZOLE SODIUM 40 MG PO TBEC
40.0000 mg | DELAYED_RELEASE_TABLET | Freq: Every day | ORAL | Status: DC
  Administered 2022-03-26 – 2022-04-02 (×8): 40 mg via ORAL
  Filled 2022-03-24 (×8): qty 1

## 2022-03-24 MED ORDER — AMIODARONE HCL IN DEXTROSE 360-4.14 MG/200ML-% IV SOLN: 30.0000 mg/h | INTRAVENOUS | Status: DC

## 2022-03-24 MED ORDER — HEMOSTATIC AGENTS (NO CHARGE) OPTIME
TOPICAL | Status: DC | PRN
  Administered 2022-03-24: 1 via TOPICAL

## 2022-03-24 MED ORDER — 0.9 % SODIUM CHLORIDE (POUR BTL) OPTIME
TOPICAL | Status: DC | PRN
  Administered 2022-03-24: 5000 mL

## 2022-03-24 MED ORDER — POTASSIUM CHLORIDE 10 MEQ/50ML IV SOLN
10.0000 meq | INTRAVENOUS | Status: AC
  Administered 2022-03-24 (×3): 10 meq via INTRAVENOUS

## 2022-03-24 MED ORDER — METOPROLOL TARTRATE 25 MG/10 ML ORAL SUSPENSION: 12.5000 mg | Freq: Two times a day (BID) | ORAL | Status: DC

## 2022-03-24 MED ORDER — SODIUM CHLORIDE 0.45 % IV SOLN: INTRAVENOUS | Status: DC | PRN

## 2022-03-24 MED ORDER — PROTAMINE SULFATE 10 MG/ML IV SOLN
INTRAVENOUS | Status: DC | PRN
  Administered 2022-03-24: 30 mg via INTRAVENOUS
  Administered 2022-03-24: 450 mg via INTRAVENOUS

## 2022-03-24 MED ORDER — FAMOTIDINE IN NACL 20-0.9 MG/50ML-% IV SOLN
20.0000 mg | Freq: Two times a day (BID) | INTRAVENOUS | Status: AC
  Administered 2022-03-24: 20 mg via INTRAVENOUS
  Filled 2022-03-24: qty 50

## 2022-03-24 MED ORDER — INSULIN REGULAR(HUMAN) IN NACL 100-0.9 UT/100ML-% IV SOLN
INTRAVENOUS | Status: DC
  Administered 2022-03-25: 4.6 [IU]/h via INTRAVENOUS

## 2022-03-24 MED ORDER — MIDAZOLAM HCL 2 MG/2ML IJ SOLN: 2.0000 mg | INTRAMUSCULAR | Status: DC | PRN

## 2022-03-24 MED ORDER — ACETAMINOPHEN 650 MG RE SUPP
650.0000 mg | Freq: Once | RECTAL | Status: AC
  Administered 2022-03-24: 650 mg via RECTAL

## 2022-03-24 MED ORDER — LACTATED RINGERS IV SOLN: 500.0000 mL | Freq: Once | INTRAVENOUS | Status: DC | PRN

## 2022-03-24 MED ORDER — LACTATED RINGERS IV SOLN
INTRAVENOUS | Status: DC | PRN
Start: 2022-03-24 — End: 2022-03-24

## 2022-03-24 MED ORDER — CHLORHEXIDINE GLUCONATE 0.12 % MT SOLN
15.0000 mL | OROMUCOSAL | Status: AC
  Administered 2022-03-24: 15 mL via OROMUCOSAL

## 2022-03-24 MED ORDER — AMIODARONE HCL IN DEXTROSE 360-4.14 MG/200ML-% IV SOLN
60.0000 mg/h | INTRAVENOUS | Status: DC
Start: 2022-03-24 — End: 2022-03-24

## 2022-03-24 MED ORDER — CHLORHEXIDINE GLUCONATE CLOTH 2 % EX PADS
6.0000 | MEDICATED_PAD | Freq: Every day | CUTANEOUS | Status: DC
  Administered 2022-03-24 – 2022-03-30 (×8): 6 via TOPICAL

## 2022-03-24 MED ORDER — PHENYLEPHRINE 40 MCG/ML (10ML) SYRINGE FOR IV PUSH (FOR BLOOD PRESSURE SUPPORT)
PREFILLED_SYRINGE | INTRAVENOUS | Status: DC | PRN
  Administered 2022-03-24: 160 ug via INTRAVENOUS
  Administered 2022-03-24 (×2): 80 ug via INTRAVENOUS
  Administered 2022-03-24: 120 ug via INTRAVENOUS
  Administered 2022-03-24: 80 ug via INTRAVENOUS
  Administered 2022-03-24: 120 ug via INTRAVENOUS
  Administered 2022-03-24: 160 ug via INTRAVENOUS
  Administered 2022-03-24: 80 ug via INTRAVENOUS
  Administered 2022-03-24 (×2): 120 ug via INTRAVENOUS

## 2022-03-24 MED ORDER — CEFAZOLIN SODIUM-DEXTROSE 2-4 GM/100ML-% IV SOLN
2.0000 g | Freq: Three times a day (TID) | INTRAVENOUS | Status: AC
  Administered 2022-03-24 – 2022-03-26 (×6): 2 g via INTRAVENOUS
  Filled 2022-03-24 (×6): qty 100

## 2022-03-24 MED ORDER — ALBUMIN HUMAN 5 % IV SOLN
250.0000 mL | INTRAVENOUS | Status: AC | PRN
  Administered 2022-03-24: 12.5 g via INTRAVENOUS

## 2022-03-24 MED ORDER — HEMOSTATIC AGENTS (NO CHARGE) OPTIME
TOPICAL | Status: DC | PRN
  Administered 2022-03-24 (×2): 1 via TOPICAL

## 2022-03-24 MED ORDER — FLUTICASONE FUROATE-VILANTEROL 200-25 MCG/ACT IN AEPB
1.0000 | INHALATION_SPRAY | Freq: Every day | RESPIRATORY_TRACT | Status: DC
  Administered 2022-03-25 – 2022-04-02 (×9): 1 via RESPIRATORY_TRACT
  Filled 2022-03-24 (×2): qty 28

## 2022-03-24 MED ORDER — DOFETILIDE 500 MCG PO CAPS: 500.0000 ug | ORAL_CAPSULE | Freq: Two times a day (BID) | ORAL | Status: DC

## 2022-03-24 SURGICAL SUPPLY — 92 items
ADAPTER CARDIO PERF ANTE/RETRO (ADAPTER) ×4 IMPLANT
BAG DECANTER FOR FLEXI CONT (MISCELLANEOUS) ×4 IMPLANT
BLADE CLIPPER SURG (BLADE) ×4 IMPLANT
BLADE NDL 3 SS STRL (BLADE) IMPLANT
BLADE NEEDLE 3 SS STRL (BLADE) ×4 IMPLANT
BLADE STERNUM SYSTEM 6 (BLADE) ×4 IMPLANT
BLADE SURG 12 STRL SS (BLADE) ×4 IMPLANT
BNDG ELASTIC 4X5.8 VLCR STR LF (GAUZE/BANDAGES/DRESSINGS) ×1 IMPLANT
BNDG ELASTIC 6X5.8 VLCR STR LF (GAUZE/BANDAGES/DRESSINGS) ×1 IMPLANT
BNDG GAUZE ELAST 4 BULKY (GAUZE/BANDAGES/DRESSINGS) ×1 IMPLANT
CABLE PACING FASLOC BIEGE (MISCELLANEOUS) ×1 IMPLANT
CANISTER SUCT 3000ML PPV (MISCELLANEOUS) ×4 IMPLANT
CANNULA GUNDRY RCSP 15FR (MISCELLANEOUS) ×4 IMPLANT
CATH CPB KIT VANTRIGT (MISCELLANEOUS) ×4 IMPLANT
CATH ROBINSON RED A/P 18FR (CATHETERS) ×12 IMPLANT
CATH THORACIC 28FR RT ANG (CATHETERS) ×4 IMPLANT
CLIP RETRACTION 3.0MM CORONARY (MISCELLANEOUS) ×1 IMPLANT
CONTAINER PROTECT SURGISLUSH (MISCELLANEOUS) ×8 IMPLANT
DERMABOND ADVANCED (GAUZE/BANDAGES/DRESSINGS) ×1
DERMABOND ADVANCED .7 DNX12 (GAUZE/BANDAGES/DRESSINGS) IMPLANT
DRAIN CHANNEL 32F RND 10.7 FF (WOUND CARE) ×4 IMPLANT
DRAPE CARDIOVASCULAR INCISE (DRAPES) ×1
DRAPE SLUSH/WARMER DISC (DRAPES) ×4 IMPLANT
DRAPE SRG 135X102X78XABS (DRAPES) ×3 IMPLANT
DRSG AQUACEL AG ADV 3.5X14 (GAUZE/BANDAGES/DRESSINGS) ×4 IMPLANT
ELECT BLADE 4.0 EZ CLEAN MEGAD (MISCELLANEOUS) ×4
ELECT BLADE 6.5 EXT (BLADE) ×4 IMPLANT
ELECT CAUTERY BLADE 6.4 (BLADE) ×4 IMPLANT
ELECT REM PT RETURN 9FT ADLT (ELECTROSURGICAL) ×8
ELECTRODE BLDE 4.0 EZ CLN MEGD (MISCELLANEOUS) ×3 IMPLANT
ELECTRODE REM PT RTRN 9FT ADLT (ELECTROSURGICAL) ×6 IMPLANT
FELT TEFLON 1X6 (MISCELLANEOUS) ×8 IMPLANT
GAUZE 4X4 16PLY ~~LOC~~+RFID DBL (SPONGE) ×4 IMPLANT
GAUZE SPONGE 4X4 12PLY STRL (GAUZE/BANDAGES/DRESSINGS) ×8 IMPLANT
GLOVE BIO SURGEON STRL SZ 6.5 (GLOVE) ×1 IMPLANT
GLOVE BIOGEL PI IND STRL 7.5 (GLOVE) IMPLANT
GLOVE BIOGEL PI INDICATOR 7.5 (GLOVE) ×1
GLOVE SURG ENC MOIS LTX SZ7.5 (GLOVE) ×12 IMPLANT
GOWN STRL REUS W/ TWL LRG LVL3 (GOWN DISPOSABLE) ×12 IMPLANT
GOWN STRL REUS W/TWL LRG LVL3 (GOWN DISPOSABLE) ×7
HEMOSTAT POWDER SURGIFOAM 1G (HEMOSTASIS) ×12 IMPLANT
HEMOSTAT SURGICEL 2X14 (HEMOSTASIS) ×4 IMPLANT
INSERT FOGARTY XLG (MISCELLANEOUS) ×1 IMPLANT
KIT BASIN OR (CUSTOM PROCEDURE TRAY) ×4 IMPLANT
KIT SUCTION CATH 14FR (SUCTIONS) ×4 IMPLANT
KIT TURNOVER KIT B (KITS) ×4 IMPLANT
KIT VASOVIEW HEMOPRO 2 VH 4000 (KITS) ×4 IMPLANT
LEAD PACING MYOCARDI (MISCELLANEOUS) ×5 IMPLANT
MARKER GRAFT CORONARY BYPASS (MISCELLANEOUS) ×12 IMPLANT
NS IRRIG 1000ML POUR BTL (IV SOLUTION) ×20 IMPLANT
PACK E OPEN HEART (SUTURE) ×4 IMPLANT
PACK OPEN HEART (CUSTOM PROCEDURE TRAY) ×4 IMPLANT
PAD ARMBOARD 7.5X6 YLW CONV (MISCELLANEOUS) ×8 IMPLANT
PAD ELECT DEFIB RADIOL ZOLL (MISCELLANEOUS) ×4 IMPLANT
PENCIL BUTTON HOLSTER BLD 10FT (ELECTRODE) ×4 IMPLANT
POSITIONER HEAD DONUT 9IN (MISCELLANEOUS) ×4 IMPLANT
POWDER SURGICEL 3.0 GRAM (HEMOSTASIS) ×4 IMPLANT
PUNCH AORTIC ROTATE 4.5MM 8IN (MISCELLANEOUS) ×1 IMPLANT
PUNCH AORTIC ROTATE 5MM 8IN (MISCELLANEOUS) ×1 IMPLANT
SET MPS 3-ND DEL (MISCELLANEOUS) ×1 IMPLANT
SPONGE T-LAP 18X18 ~~LOC~~+RFID (SPONGE) ×18 IMPLANT
SPONGE T-LAP 4X18 ~~LOC~~+RFID (SPONGE) ×5 IMPLANT
SUPPORT HEART JANKE-BARRON (MISCELLANEOUS) ×4 IMPLANT
SURGIFLO W/THROMBIN 8M KIT (HEMOSTASIS) ×4 IMPLANT
SUT BONE WAX W31G (SUTURE) ×4 IMPLANT
SUT MNCRL AB 4-0 PS2 18 (SUTURE) ×1 IMPLANT
SUT PROLENE 4 0 RB 1 (SUTURE) ×3
SUT PROLENE 4 0 SH DA (SUTURE) ×4 IMPLANT
SUT PROLENE 4-0 RB1 .5 CRCL 36 (SUTURE) ×3 IMPLANT
SUT PROLENE 5 0 C 1 36 (SUTURE) ×1 IMPLANT
SUT PROLENE 6 0 C 1 30 (SUTURE) ×2 IMPLANT
SUT PROLENE 6 0 CC (SUTURE) ×11 IMPLANT
SUT PROLENE BLUE 7 0 (SUTURE) ×4 IMPLANT
SUT SILK  1 MH (SUTURE) ×3
SUT SILK 1 MH (SUTURE) IMPLANT
SUT SILK 2 0 SH CR/8 (SUTURE) ×1 IMPLANT
SUT STEEL 6MS V (SUTURE) ×7 IMPLANT
SUT STEEL SZ 6 DBL 3X14 BALL (SUTURE) ×5 IMPLANT
SUT VIC AB 1 CTX 36 (SUTURE) ×2
SUT VIC AB 1 CTX36XBRD ANBCTR (SUTURE) ×6 IMPLANT
SUT VIC AB 2-0 CT1 27 (SUTURE) ×1
SUT VIC AB 2-0 CT1 TAPERPNT 27 (SUTURE) IMPLANT
SYSTEM SAHARA CHEST DRAIN ATS (WOUND CARE) ×4 IMPLANT
TAPE CLOTH SURG 4X10 WHT LF (GAUZE/BANDAGES/DRESSINGS) ×2 IMPLANT
TAPE PAPER 2X10 WHT MICROPORE (GAUZE/BANDAGES/DRESSINGS) ×1 IMPLANT
TOWEL GREEN STERILE (TOWEL DISPOSABLE) ×4 IMPLANT
TOWEL GREEN STERILE FF (TOWEL DISPOSABLE) ×4 IMPLANT
TRAY FOLEY SLVR 14FR TEMP STAT (SET/KITS/TRAYS/PACK) ×1 IMPLANT
TRAY FOLEY SLVR 16FR TEMP STAT (SET/KITS/TRAYS/PACK) ×4 IMPLANT
TUBING LAP HI FLOW INSUFFLATIO (TUBING) ×4 IMPLANT
UNDERPAD 30X36 HEAVY ABSORB (UNDERPADS AND DIAPERS) ×4 IMPLANT
WATER STERILE IRR 1000ML POUR (IV SOLUTION) ×8 IMPLANT

## 2022-03-24 NOTE — Anesthesia Procedure Notes (Signed)
Central Venous Catheter Insertion ?Performed by: Darral Dash, DO, anesthesiologist ?Start/End4/10/2022 7:03 AM, 03/24/2022 7:15 AM ?Patient location: Pre-op. ?Preanesthetic checklist: patient identified, IV checked, site marked, risks and benefits discussed, surgical consent, monitors and equipment checked, pre-op evaluation, timeout performed and anesthesia consent ?Position: Trendelenburg ?Lidocaine 1% used for infiltration and patient sedated ?Hand hygiene performed  and maximum sterile barriers used  ?Catheter size: 9 Fr ?Central line was placed.MAC introducer ?Procedure performed using ultrasound guided technique. ?Ultrasound Notes:anatomy identified, needle tip was noted to be adjacent to the nerve/plexus identified, no ultrasound evidence of intravascular and/or intraneural injection and image(s) printed for medical record ?Attempts: 1 ?Following insertion, line sutured, dressing applied and Biopatch. ?Post procedure assessment: blood return through all ports, free fluid flow and no air ? ?Patient tolerated the procedure well with no immediate complications. ? ? ? ? ?

## 2022-03-24 NOTE — Anesthesia Postprocedure Evaluation (Signed)
Anesthesia Post Note ? ?Patient: [facility] ? ?Procedure(s) Performed: CORONARY ARTERY BYPASS GRAFTING (CABG) TIMES TWO USING LEFT INTERNAL MAMMARY ARTERY AND ENDOSCOPICALLY HARVESTED RIGHT GREATER SAPHENOUS VEIN (Chest) ?TRANSESOPHAGEAL ECHOCARDIOGRAM (TEE) ?ENDOVEIN HARVEST OF GREATER SAPHENOUS VEIN (Right: Leg Lower) ? ?  ? ?Patient location during evaluation: SICU ?Anesthesia Type: General ?Level of consciousness: sedated ?Pain management: pain level controlled ?Vital Signs Assessment: post-procedure vital signs reviewed and stable ?Respiratory status: patient remains intubated per anesthesia plan ?Cardiovascular status: stable ?Postop Assessment: no apparent nausea or vomiting ?Anesthetic complications: no ? ? ?No notable events documented. ? ?Last Vitals:  ?Vitals:  ? 03/24/22 1530 03/24/22 1545  ?BP:    ?Pulse: 91 88  ?Resp: 16 16  ?Temp: (!) 36.2 ?C (!) 36.2 ?C  ?SpO2: 97% 97%  ?  ?Last Pain:  ?Vitals:  ? 03/24/22 1500  ?TempSrc: Core  ?PainSc:   ? ? ?  ?  ?  ?  ?  ?  ? ?Nelle Don Tytionna Cloyd ? ? ? ? ?

## 2022-03-24 NOTE — Anesthesia Procedure Notes (Signed)
Central Venous Catheter Insertion ?Performed by: Atilano Median, DO, anesthesiologist ?Start/End4/10/2022 7:16 AM, 03/24/2022 7:18 AM ?Patient location: Pre-op. ?Preanesthetic checklist: patient identified, IV checked, site marked, risks and benefits discussed, surgical consent, monitors and equipment checked, pre-op evaluation, timeout performed and anesthesia consent ?Position: supine ?Hand hygiene performed  and maximum sterile barriers used  ?PA cath was placed.Swan type:thermodilution ?PA Cath depth:50 ?Procedure performed without using ultrasound guided technique. ?Attempts: 1 ?Patient tolerated the procedure well with no immediate complications. ? ? ? ?

## 2022-03-24 NOTE — Transfer of Care (Signed)
Immediate Anesthesia Transfer of Care Note ? ?Patient: [facility] ? ?Procedure(s) Performed: CORONARY ARTERY BYPASS GRAFTING (CABG) TIMES TWO USING LEFT INTERNAL MAMMARY ARTERY AND ENDOSCOPICALLY HARVESTED RIGHT GREATER SAPHENOUS VEIN (Chest) ?TRANSESOPHAGEAL ECHOCARDIOGRAM (TEE) ?ENDOVEIN HARVEST OF GREATER SAPHENOUS VEIN (Right: Leg Lower) ? ?Patient Location: SICU ? ?Anesthesia Type:General ? ?Level of Consciousness: Patient remains intubated per anesthesia plan ? ?Airway & Oxygen Therapy: Patient remains intubated per anesthesia plan and Patient placed on Ventilator (see vital sign flow sheet for setting) ? ?Post-op Assessment: Report given to RN and Post -op Vital signs reviewed and stable ? ?Post vital signs: Reviewed and stable ? ?Last Vitals:  ?Vitals Value Taken Time  ?BP 110/68 03/24/22 1435  ?Temp 36.2 ?C 03/24/22 1437  ?Pulse 89 03/24/22 1437  ?Resp 15 03/24/22 1437  ?SpO2 95 % 03/24/22 1437  ?Vitals shown include unvalidated device data. ? ?Last Pain:  ?Vitals:  ? 03/24/22 0349  ?TempSrc: Oral  ?PainSc:   ?   ? ?Patients Stated Pain Goal: 0 (03/22/22 2137) ? ?Complications: No notable events documented. ?

## 2022-03-24 NOTE — Progress Notes (Signed)
CT surgery PM rounds ? ?Patient hemodynamically stable and responding on ventilator ?Ventilator wean in progress ?Patient has COPD with preoperative baseline PCO2 of 50 ?Minimal chest tube output ?Postop ABG satisfactory ?Atrially paced for sinus rhythm ? ?Blood pressure (!) 112/56, pulse 88, temperature 97.9 ?F (36.6 ?C), resp. rate (!) 22, height 6\' 1"  (1.854 m), weight (!) 139.3 kg, SpO2 98 %.  ?

## 2022-03-24 NOTE — Plan of Care (Signed)
  Problem: Coping: Goal: Level of anxiety will decrease Outcome: Progressing   Problem: Pain Managment: Goal: General experience of comfort will improve Outcome: Progressing   Problem: Safety: Goal: Ability to remain free from injury will improve Outcome: Progressing   

## 2022-03-24 NOTE — Procedures (Signed)
Extubation Procedure Note ? ?Patient Details:   ?Name: [facility] ?DOB: 04/19/1956 ?MRN: [id number] ?  ?Airway Documentation:  ?  ?Vent end date: 03/24/22 Vent end time: 2018  ? ?Evaluation ? O2 sats: stable throughout ?Complications: No apparent complications ?Patient did tolerate procedure well. ?Bilateral Breath Sounds: Clear, Diminished ?  ?Yes ?FVC 1.84L ?NIF: -24 ?Positive Cuff Leak ?Pt placed on 4L [place] ? ?Juventino Slovak V ?03/24/2022, 8:19 PM ? ?

## 2022-03-24 NOTE — Anesthesia Procedure Notes (Signed)
Arterial Line Insertion ?Start/End4/10/2022 6:55 AM, 03/24/2022 7:03 AM ?Performed by: Atilano Median, DO, Kara Mead, CRNA, CRNA ? Patient location: Pre-op. ?Preanesthetic checklist: patient identified, IV checked, site marked, risks and benefits discussed, surgical consent, monitors and equipment checked, pre-op evaluation, timeout performed and anesthesia consent ?Lidocaine 1% used for infiltration and patient sedated ?Right, radial was placed ?Catheter size: 20 G ?Hand hygiene performed  and maximum sterile barriers used  ? ?Attempts: 1 ?Procedure performed without using ultrasound guided technique. ?Following insertion, Biopatch and dressing applied. ?Post procedure assessment: normal ? ?Patient tolerated the procedure well with no immediate complications. ? ? ?

## 2022-03-24 NOTE — Op Note (Signed)
NAME: [facility], [facility]. ?MEDICAL RECORD NO: [id number] ?ACCOUNT NO: [id number] ?DATE OF BIRTH: Mar 08, 1956 ?FACILITY: [facility] ?LOCATION: [facility] ?PHYSICIAN: Len Childs, MD ? ?Operative Report  ? ?DATE OF PROCEDURE: 03/24/2022 ? ?PROCEDURES PERFORMED:  ?1.  Coronary artery bypass grafting x2 (left internal mammary artery to left anterior descending, saphenous vein graft to right coronary artery posterolateral). ?2.  Endoscopic harvest of right leg greater saphenous vein. ? ?SURGEON:  Len Childs, MD ? ?ASSISTANT:  Enid Cutter, PA-Church ? ?A surgical first assistant was required for this operation due to its complexity and the standard of care for cardiac surgery.  The first assistant was needed for endoscopic harvesting of the leg saphenous vein conduit and closure of the leg incision.   ?The assistant was also needed for completing the distal anastomoses including suture management, exposure, suctioning, and general assistance. ? ?PREOPERATIVE DIAGNOSES:  ?1.  Acute coronary syndrome with severe multivessel coronary artery disease, failed attempt at percutaneous coronary intervention of ostial right coronary artery calcified plaque stenosis. ?2.  Morbid obesity. ?3.  Chronic obstructive pulmonary disease, on home oxygen. ? ?POSTOPERATIVE DIAGNOSES:  ?1.  Acute coronary syndrome with severe multivessel coronary artery disease, failed attempt at percutaneous coronary intervention of ostial right coronary artery calcified plaque stenosis. ?2.  Morbid obesity. ?3.  Chronic obstructive pulmonary disease, on home oxygen. ? ?DESCRIPTION OF PROCEDURE:  The patient was evaluated in the preoperative holding area and informed consent was documented.  I discussed the final issues of the procedure in a face-to-face meeting with the patient and all questions were addressed. ? ?The patient was then brought back to the operating room and placed supine on the operating table.  Under invasive hemodynamic monitoring, the  patient was induced for general anesthesia and remained stable.  A transesophageal echo probe was placed by the  ?anesthesia team.  The patient was then positioned after a Foley catheter was inserted.  Positioning of the patient was challenging due to his morbid obesity.  The patient was then prepped and draped as a sterile field.  A proper timeout was performed.  A ? sternal incision was made as the saphenous vein was harvested endoscopically from the right leg.  Using the sternal elevating retractor, the left internal mammary artery was harvested as a pedicle graft from its origin at the subclavian vessels.  This  ?was a challenging procedure due to the patient's obesity and deep chest with multiple layers of fat and soft tissue. ? ?The sternal retractor was then placed using the deep blades due to the patient's body habitus.  The pericardium was opened and suspended.  There was a small pericardial effusion.  Pursestrings were placed in the ascending aorta and right atrium and after ? heparin was administered and ACT was documented as being therapeutic, the patient was cannulated and placed on cardiopulmonary bypass.  Cardioplegia cannula was placed in the ascending aorta and the mammary artery and vein grafts were prepared for the  ?distal anastomoses.  The patient was cooled to 32 degrees and the aortic crossclamp was applied.  One liter of cold blood cardioplegia was delivered with good cardioplegic arrest and septal temperature dropped less than 14 degrees.  Cardioplegia was  ?delivered every 20 minutes while the crossclamp was in place. ? ?The right coronary was inspected.  It was heavily calcified past the bifurcation.  A segment of the posterolateral branch of the distal RCA was the best target, measuring approximately 1.4 mm in diameter.  The anastomosis was  completed using a running  ?7-0 Prolene and there was good flow through the graft.  Cardioplegia was redosed. ? ?The LAD had heavy calcification  proximally.  Distal to the severe calcified plaque, the vessel was an adequate target.  The left IMA pedicle was brought through an opening in the pericardium, was brought down onto the LAD and sewn end-to-side with  ?running 8-0 Prolene.  There was good flow through the anastomosis after briefly releasing the pedicle bulldog on the mammary artery.  The bulldog was reapplied and the pedicle secured to the epicardium.  Cardioplegia was redosed. ? ?After another dose of cardioplegia, the proximal vein anastomosis was performed on the ascending aorta using a 5.0 mm punch and running 6-0 Prolene.  Prior to tying down the final proximal anastomosis, air was vented from the left side of the heart and  ?the coronaries using the usual de-airing maneuvers.  The crossclamp was removed. ? ?The vein grafts were de-aired and opened.  Each had good flow and were hemostatic at the proximal and distal anastomoses.  The patient was cardioverted back to a regular rhythm.  Temporary pacing wires were applied.  The patient was started on milrinone  ?and low dose norepinephrine.  The lungs were expanded and ventilator was resumed.  After the patient was adequately rewarmed and reperfused, he was weaned from cardiopulmonary bypass, AV paced.  He had stable hemodynamics.  Protamine was administered  ?without adverse reaction.  The cannulas were removed.  There was still some diffuse coagulopathy and the patient received a unit of FFP with improved coagulation function. ? ?The mediastinal fat was closed over the aorta and vein graft.  Anterior mediastinal and bilateral pleural tubes were placed and brought out through separate incisions. ? ?The sternum was closed with interrupted double gauge wire sutures.  The chest was reapproximated and the wires twisted and the patient remained stable.  The pectoralis fascia was closed with a running #1 Vicryl.  Subcutaneous and skin layers were closed  ?using running Vicryl.  Sterile dressings were  applied.  Total cardiopulmonary bypass time was 109 minutes. ? ? ?SHW ?D: 03/24/2022 5:32:33 pm T: 03/24/2022 11:09:00 pm  ?JOB: [id number]  ?

## 2022-03-24 NOTE — Progress Notes (Signed)
Pre Procedure note for inpatients: ?  ?[facility] has been scheduled for Procedure(s): ?CORONARY ARTERY BYPASS GRAFTING (CABG) (N/A) ?TRANSESOPHAGEAL ECHOCARDIOGRAM (TEE) (N/A) today. The various methods of treatment have been discussed with the patient. After consideration of the risks, benefits and treatment options the patient has consented to the planned procedure.  ? ?The patient has been seen and labs reviewed. There are no changes in the patient?s condition to prevent proceeding with the planned procedure today. ? ?Recent labs: ? ?Lab Results  ?Component Value Date  ? WBC 13.9 (H) 03/24/2022  ? HGB 14.9 03/24/2022  ? HCT 44.5 03/24/2022  ? PLT 259 03/24/2022  ? GLUCOSE 132 (H) 03/24/2022  ? CHOL 165 03/15/2022  ? TRIG 91 03/15/2022  ? HDL 36 (L) 03/15/2022  ? LDLCALC 111 (H) 03/15/2022  ? ALT 29 03/20/2022  ? AST 25 03/20/2022  ? NA 134 (L) 03/24/2022  ? K 4.8 03/24/2022  ? CL 99 03/24/2022  ? CREATININE 0.74 03/24/2022  ? BUN 12 03/24/2022  ? CO2 27 03/24/2022  ? TSH 2.492 03/20/2022  ? INR 1.1 03/23/2022  ? HGBA1C 5.9 (H) 03/23/2022  ? ? ?Dahlia Byes, MD ?03/24/2022 7:44 AM ? ? ?   ?

## 2022-03-24 NOTE — Brief Op Note (Addendum)
03/14/2022 - 03/24/2022 ? ?12:11 PM ? ?PATIENT:  Arthur Church  66 y.o. male ? ?PRE-OPERATIVE DIAGNOSIS:  CORONARY ARTERY DISEASE ? ?POST-OPERATIVE DIAGNOSIS:  CORONARY ARTERY DISEASE ? ?PROCEDURE:   ?CORONARY ARTERY BYPASS GRAFTING (CABG) TIMES TWO USING LEFT INTERNAL MAMMARY ARTERY AND ENDOSCOPICALLY HARVESTED RIGHT GREATER SAPHENOUS VEIN  (Thigh only) ? ?LIMA->LAD ?SVG->PL ? ?TRANSESOPHAGEAL ECHOCARDIOGRAM  ? ?ENDOVEIN HARVEST OF GREATER SAPHENOUS VEIN (Right) ?Vein harvest time: Vein prep time: ? ?SURGEON:  Lovett Sox, MD  ? ?PHYSICIAN ASSISTANT: [place] ? ?ASSISTANTS: Christella Scheuermann, RN, RN First Assistant  ? ?ANESTHESIA:   general ? ?EBL:   ? ?BLOOD ADMINISTERED:none ? ?DRAINS:  Mediastinal and left pleural drains   ? ?LOCAL MEDICATIONS USED:  NONE ? ?COUNTS:  Correct ? ?DICTATION: .Dragon Dictation ? ?PLAN OF CARE: Admit to inpatient  ? ?PATIENT DISPOSITION:  ICU - intubated and hemodynamically stable. ?  ?Delay start of Pharmacological VTE agent (>24hrs) due to surgical blood loss or risk of bleeding: yes ? ?

## 2022-03-24 NOTE — Hospital Course (Addendum)
Referring: Dr. Angelena Form and Dr. Doylene Canard MD ?Primary Care: Andria Frames, PA-C ?Primary Cardiologist:Kenneth Wells Guiles, MD ? ?History of Present Illness:     ?This is a 66 year old male with a past medical history of chronic systolic CHF, ETOH and tobacco abuse, hypertension, morbid obesity, persistent a fib, COPD/emphysema, and pre diabetes who has had intermittent chest pain with radiation to both shoulders for the last several weeks. EMS was summonsed and he was given SL NItro and asa. He presented to [facility] ED for further evaluation and treatment. He denies LE edema, syncope, shortness of breath, diaphoresis, or nausea/vomiting. Max Troponin I (high sensitivity) was 164. Echo done 03/14/2022 showed LVEF 55-60%, the left ventricle has no regional wall motion abnormalities, mild to moderate MR, mild calcification of the aortic valve, mild TR, and no pericardial effusion. Cardiac catheterization done 03/18/2022 showed proximal to mid LAD with an 85% stenosis and a proximal RCA lesion that is 90% stenosed. Dr. Angelena Form attempted a PCI of the RCA but this was unsuccessful as RCA not able to be engaged despite using different catheters.  ?Patient and his wife (widowed 3 years ago) moved here several years ago from [place] to help their daughter with grandsons. Patient continues to help take care of his 2 grandsons (now ages 79 and 23) . Even though he uses oxygen at night, he states he is ambulatory and able to do his ADLs. ?Dr. Prescott Gum has been consulted for the consideration of coronary artery bypass grafting surgery. At the time of my exam, patient denies chest pain or shortness of breath. ?Dr. Darcey Nora recommended coronary bypass grafting and [facility] elected to proceed.  He remained stable following left heart catheterization.  Preoperative work-up was satisfactory.  Vein mapping showed adequate conduit in the right lower extremity. ? ?[facility] was taken to the operating room on 03/24/2022 where CABG x2  was accomplished.  Following the procedure, he separated from cardiopulmonary bypass on milrinone and norepinephrine. ? ?Postoperative Hospital Course: ? ?[facility] was transferred to the surgical ICU in stable condition.  Vital signs and hemodynamics remained stable.  He was weaned from the ventilator and extubated by 8:30 PM on the day of surgery.He was on Milrinone drip post op. He developed some atrial fibrillation and was started on amiodarone infusion.  He converted to stable sinus rhythm.  Diuresis was begun on the first postoperative day for expected volume excess.  The amiodarone was converted to oral form on postop day 1.  He was mobilized with the assistance of physical therapy.  Home PT was also recommended by the physical therapy team along with rolling walker and bedside commode.  This equipment was arranged.  On postop day 2, he was transitioned back to his Tikosyn that he was taking prior to admission.  Tikosyn was then stopped and Diltiazem drip was started. He was also on Digoxin and Lopressor. The chest tubes and monitoring lines were removed. EPW were removed on 04/14. Milrinone drip was stopped on 04/15. He had nausea post op and Zofran and Reglan alleviated this.  He was restarted on his home Eliquis.  He underwent ultrasound to assess for pleural effusion.  There was not felt to be a sufficient amount of fluid present for drainage.  He was felt surgically stable for transfer from the ICU to 4E on 04/18.  He remains in rate controlled Atrial Fibrillation.  He continues to diurese well on lasix.  He is ambulating without difficulty.  His surgical  incisions are healing without evidence of infection.  He is medically stable for discharge home today. ?

## 2022-03-24 NOTE — Anesthesia Procedure Notes (Signed)
Procedure Name: Intubation ?Date/Time: 03/24/2022 8:11 AM ?Performed by: Vonna Drafts, CRNA ?Pre-anesthesia Checklist: Patient identified, Emergency Drugs available, Suction available and Patient being monitored ?Patient Re-evaluated:Patient Re-evaluated prior to induction ?Oxygen Delivery Method: Circle system utilized ?Preoxygenation: Pre-oxygenation with 100% oxygen ?Induction Type: IV induction ?Ventilation: Two handed mask ventilation required ?Laryngoscope Size: Mac and 4 ?Grade View: Grade I ?Tube type: Oral ?Tube size: 8.0 mm ?Number of attempts: 1 ?Airway Equipment and Method: Stylet and Oral airway ?Placement Confirmation: ETT inserted through vocal cords under direct vision, positive ETCO2 and breath sounds checked- equal and bilateral ?Secured at: 24 cm ?Tube secured with: Tape ?Dental Injury: Teeth and Oropharynx as per pre-operative assessment  ? ? ? ? ?

## 2022-03-25 ENCOUNTER — Inpatient Hospital Stay (HOSPITAL_COMMUNITY): Payer: Medicare (Managed Care)

## 2022-03-25 ENCOUNTER — Encounter (HOSPITAL_COMMUNITY): Payer: Self-pay | Admitting: Cardiothoracic Surgery

## 2022-03-25 LAB — GLUCOSE, CAPILLARY
Glucose-Capillary: 104 mg/dL — ABNORMAL HIGH (ref 70–99)
Glucose-Capillary: 112 mg/dL — ABNORMAL HIGH (ref 70–99)
Glucose-Capillary: 116 mg/dL — ABNORMAL HIGH (ref 70–99)
Glucose-Capillary: 128 mg/dL — ABNORMAL HIGH (ref 70–99)
Glucose-Capillary: 129 mg/dL — ABNORMAL HIGH (ref 70–99)
Glucose-Capillary: 134 mg/dL — ABNORMAL HIGH (ref 70–99)
Glucose-Capillary: 136 mg/dL — ABNORMAL HIGH (ref 70–99)
Glucose-Capillary: 145 mg/dL — ABNORMAL HIGH (ref 70–99)
Glucose-Capillary: 157 mg/dL — ABNORMAL HIGH (ref 70–99)
Glucose-Capillary: 159 mg/dL — ABNORMAL HIGH (ref 70–99)
Glucose-Capillary: 178 mg/dL — ABNORMAL HIGH (ref 70–99)
Glucose-Capillary: 188 mg/dL — ABNORMAL HIGH (ref 70–99)
Glucose-Capillary: 193 mg/dL — ABNORMAL HIGH (ref 70–99)

## 2022-03-25 LAB — CBC
HCT: 37.6 % — ABNORMAL LOW (ref 39.0–52.0)
HCT: 39.5 % (ref 39.0–52.0)
Hemoglobin: 12.4 g/dL — ABNORMAL LOW (ref 13.0–17.0)
Hemoglobin: 12.3 g/dL — ABNORMAL LOW (ref 13.0–17.0)
MCH: 28.1 pg (ref 26.0–34.0)
MCH: 27.1 pg (ref 26.0–34.0)
MCHC: 33 g/dL (ref 30.0–36.0)
MCHC: 31.1 g/dL (ref 30.0–36.0)
MCV: 85.3 fL (ref 80.0–100.0)
MCV: 87 fL (ref 80.0–100.0)
Platelets: 253 10*3/uL (ref 150–400)
Platelets: 221 10*3/uL (ref 150–400)
RBC: 4.41 MIL/uL (ref 4.22–5.81)
RBC: 4.54 MIL/uL (ref 4.22–5.81)
RDW: 14.7 % (ref 11.5–15.5)
RDW: 15.2 % (ref 11.5–15.5)
WBC: 22.5 10*3/uL — ABNORMAL HIGH (ref 4.0–10.5)
WBC: 18.9 10*3/uL — ABNORMAL HIGH (ref 4.0–10.5)
nRBC: 0 % (ref 0.0–0.2)
nRBC: 0 % (ref 0.0–0.2)

## 2022-03-25 LAB — BPAM FFP
Blood Product Expiration Date: 202304142359
Blood Product Expiration Date: 202304142359
ISSUE DATE / TIME: 202304111222
ISSUE DATE / TIME: 202304111222
Unit Type and Rh: 6200
Unit Type and Rh: 6200

## 2022-03-25 LAB — BASIC METABOLIC PANEL
Anion gap: 6 (ref 5–15)
Anion gap: 8 (ref 5–15)
BUN: 6 mg/dL — ABNORMAL LOW (ref 8–23)
BUN: 7 mg/dL — ABNORMAL LOW (ref 8–23)
CO2: 25 mmol/L (ref 22–32)
CO2: 26 mmol/L (ref 22–32)
Calcium: 8.6 mg/dL — ABNORMAL LOW (ref 8.9–10.3)
Calcium: 9 mg/dL (ref 8.9–10.3)
Chloride: 103 mmol/L (ref 98–111)
Chloride: 99 mmol/L (ref 98–111)
Creatinine, Ser: 0.47 mg/dL — ABNORMAL LOW (ref 0.61–1.24)
Creatinine, Ser: 0.61 mg/dL (ref 0.61–1.24)
GFR, Estimated: >60 mL/min (ref >60)
GFR, Estimated: >60 mL/min (ref >60)
Glucose, Bld: 138 mg/dL — ABNORMAL HIGH (ref 70–99)
Glucose, Bld: 172 mg/dL — ABNORMAL HIGH (ref 70–99)
Potassium: 4.2 mmol/L (ref 3.5–5.1)
Potassium: 4.5 mmol/L (ref 3.5–5.1)
Sodium: 134 mmol/L — ABNORMAL LOW (ref 135–145)
Sodium: 133 mmol/L — ABNORMAL LOW (ref 135–145)

## 2022-03-25 LAB — POCT I-STAT 7, (LYTES, BLD GAS, ICA,H+H)
Acid-Base Excess: 0 mmol/L (ref 0.0–2.0)
Bicarbonate: 25.1 mmol/L (ref 20.0–28.0)
Calcium, Ion: 1.22 mmol/L (ref 1.15–1.40)
HCT: 38 % — ABNORMAL LOW (ref 39.0–52.0)
Hemoglobin: 12.9 g/dL — ABNORMAL LOW (ref 13.0–17.0)
O2 Saturation: 96 %
Patient temperature: 36.7
Potassium: 4 mmol/L (ref 3.5–5.1)
Sodium: 132 mmol/L — ABNORMAL LOW (ref 135–145)
TCO2: 26 mmol/L (ref 22–32)
pCO2 arterial: 38.9 mmHg (ref 32–48)
pH, Arterial: 7.416 (ref 7.35–7.45)
pO2, Arterial: 83 mmHg (ref 83–108)

## 2022-03-25 LAB — MAGNESIUM
Magnesium: 1.9 mg/dL (ref 1.7–2.4)
Magnesium: 1.5 mg/dL — ABNORMAL LOW (ref 1.7–2.4)

## 2022-03-25 LAB — PREPARE FRESH FROZEN PLASMA
Unit division: 0
Unit division: 0

## 2022-03-25 LAB — COOXEMETRY PANEL
Carboxyhemoglobin: 1.1 % (ref 0.5–1.5)
Methemoglobin: <0.7 % (ref 0.0–1.5)
O2 Saturation: 69 %
Total hemoglobin: 12.4 g/dL (ref 12.0–16.0)

## 2022-03-25 MED ORDER — ALPRAZOLAM 0.25 MG PO TABS
0.2500 mg | ORAL_TABLET | Freq: Three times a day (TID) | ORAL | Status: AC | PRN
  Administered 2022-03-25 – 2022-03-30 (×6): 0.25 mg via ORAL
  Filled 2022-03-25 (×6): qty 1

## 2022-03-25 MED ORDER — METOPROLOL TARTRATE 25 MG PO TABS
25.0000 mg | ORAL_TABLET | Freq: Two times a day (BID) | ORAL | Status: DC
  Administered 2022-03-25 – 2022-03-26 (×2): 25 mg via ORAL
  Filled 2022-03-25 (×2): qty 1

## 2022-03-25 MED ORDER — AMIODARONE HCL IN DEXTROSE 360-4.14 MG/200ML-% IV SOLN
30.0000 mg/h | INTRAVENOUS | Status: AC
Start: 2022-03-25 — End: 2022-03-28
  Administered 2022-03-25 – 2022-03-28 (×6): 30 mg/h via INTRAVENOUS
  Filled 2022-03-25 (×4): qty 200

## 2022-03-25 MED ORDER — AMIODARONE HCL IN DEXTROSE 360-4.14 MG/200ML-% IV SOLN
60.0000 mg/h | INTRAVENOUS | Status: AC
Start: 2022-03-25 — End: 2022-03-25
  Administered 2022-03-25: 60 mg/h via INTRAVENOUS
  Filled 2022-03-25: qty 400

## 2022-03-25 MED ORDER — FUROSEMIDE 10 MG/ML IJ SOLN
20.0000 mg | Freq: Two times a day (BID) | INTRAMUSCULAR | Status: DC
  Administered 2022-03-25 (×2): 20 mg via INTRAVENOUS
  Filled 2022-03-25 (×2): qty 2

## 2022-03-25 MED ORDER — INSULIN ASPART 100 UNIT/ML IJ SOLN
0.0000 [IU] | INTRAMUSCULAR | Status: DC
  Administered 2022-03-25 – 2022-03-26 (×6): 4 [IU] via SUBCUTANEOUS
  Administered 2022-03-26: 2 [IU] via SUBCUTANEOUS

## 2022-03-25 MED ORDER — LEVALBUTEROL HCL 1.25 MG/0.5ML IN NEBU
1.2500 mg | INHALATION_SOLUTION | Freq: Four times a day (QID) | RESPIRATORY_TRACT | Status: DC
  Administered 2022-03-25 – 2022-03-26 (×3): 1.25 mg via RESPIRATORY_TRACT
  Filled 2022-03-25 (×3): qty 0.5

## 2022-03-25 MED ORDER — AMIODARONE IV BOLUS ONLY 150 MG/100ML
150.0000 mg | Freq: Once | INTRAVENOUS | Status: AC
  Administered 2022-03-25: 150 mg via INTRAVENOUS
  Filled 2022-03-25: qty 100

## 2022-03-25 MED ORDER — INSULIN DETEMIR 100 UNIT/ML ~~LOC~~ SOLN
12.0000 [IU] | Freq: Two times a day (BID) | SUBCUTANEOUS | Status: DC
  Administered 2022-03-25 – 2022-03-31 (×13): 12 [IU] via SUBCUTANEOUS
  Filled 2022-03-25 (×15): qty 0.12

## 2022-03-25 MED ORDER — ENOXAPARIN SODIUM 40 MG/0.4ML IJ SOSY
40.0000 mg | PREFILLED_SYRINGE | INTRAMUSCULAR | Status: DC
  Administered 2022-03-25 – 2022-03-26 (×2): 40 mg via SUBCUTANEOUS
  Filled 2022-03-25 (×2): qty 0.4

## 2022-03-25 MED ORDER — MELATONIN 5 MG PO TABS
5.0000 mg | ORAL_TABLET | Freq: Every evening | ORAL | Status: DC | PRN
  Administered 2022-03-25: 5 mg via ORAL
  Filled 2022-03-25: qty 1

## 2022-03-25 MED ORDER — METOPROLOL TARTRATE 25 MG/10 ML ORAL SUSPENSION: 25.0000 mg | Freq: Two times a day (BID) | ORAL | Status: DC

## 2022-03-25 MED ORDER — MAGNESIUM SULFATE 4 GM/100ML IV SOLN
4.0000 g | Freq: Once | INTRAVENOUS | Status: AC
  Administered 2022-03-25: 4 g via INTRAVENOUS
  Filled 2022-03-25: qty 100

## 2022-03-25 MED ORDER — UMECLIDINIUM BROMIDE 62.5 MCG/ACT IN AEPB
1.0000 | INHALATION_SPRAY | Freq: Every day | RESPIRATORY_TRACT | Status: DC
  Administered 2022-03-25 – 2022-04-02 (×9): 1 via RESPIRATORY_TRACT
  Filled 2022-03-25 (×2): qty 7

## 2022-03-25 MED ORDER — AMIODARONE HCL 200 MG PO TABS
400.0000 mg | ORAL_TABLET | Freq: Two times a day (BID) | ORAL | Status: DC
Start: 2022-03-25 — End: 2022-03-25
  Administered 2022-03-25: 400 mg via ORAL
  Filled 2022-03-25: qty 2

## 2022-03-25 MED FILL — Electrolyte-R (PH 7.4) Solution: INTRAVENOUS | Qty: 4000 | Status: AC

## 2022-03-25 MED FILL — Sodium Bicarbonate IV Soln 8.4%: INTRAVENOUS | Qty: 50 | Status: AC

## 2022-03-25 MED FILL — Heparin Sodium (Porcine) Inj 1000 Unit/ML: INTRAMUSCULAR | Qty: 30 | Status: AC

## 2022-03-25 MED FILL — Mannitol IV Soln 20%: INTRAVENOUS | Qty: 500 | Status: AC

## 2022-03-25 MED FILL — Sodium Chloride IV Soln 0.9%: INTRAVENOUS | Qty: 2000 | Status: AC

## 2022-03-25 NOTE — Progress Notes (Signed)
Ref: Virgilio Belling, PA-C ? ? ?Subjective:  ?Awake. Extubated last night. ?VS stable. ?S/P 2 vessel CABG, LIMA to LAD and SVG to PL branch of RCA. ? ?Objective:  ?Vital Signs in the last 24 hours: ?Temp:  [97 ?F (36.1 ?C)-98.6 ?F (37 ?C)] 98.2 ?F (36.8 ?C) (04/12 1224) ?Pulse Rate:  [76-106] 96 (04/12 1100) ?Cardiac Rhythm: Normal sinus rhythm (04/12 1100) ?Resp:  [0-58] 19 (04/12 1100) ?BP: (98-154)/(53-78) 110/73 (04/12 1100) ?SpO2:  [90 %-99 %] 95 % (04/12 1100) ?Arterial Line BP: (87-244)/(38-118) 147/58 (04/12 1100) ?FiO2 (%):  [40 %-50 %] 40 % (04/11 2000) ?Weight:  [144.4 kg] 144.4 kg (04/12 0500) ? ?Physical Exam: ?BP Readings from Last 1 Encounters:  ?03/25/22 110/73  ?   ?Wt Readings from Last 1 Encounters:  ?03/25/22 (!) 144.4 kg  ?  Weight change: 5.1 kg Body mass index is 42 kg/m?. ?HEENT: [place]/AT, Eyes-Blue, Conjunctiva-Pink, Sclera-Non-icteric ?Neck: No JVD, No bruit, Trachea midline. ?Lungs:  Clear, Bilateral. ?Cardiac:  Regular rhythm, normal S1 and S2, no S3. II/VI systolic murmur. ?Abdomen:  Soft, non-tender. BS present. ?Extremities:  No edema present. No cyanosis. No clubbing. ?CNS: AxOx3, Cranial nerves grossly intact, moves all 4 extremities.  ?Skin: Warm and dry. ? ? ?Intake/Output from previous day: ?04/11 0701 - 04/12 0700 ?In: 6175.3 [P.O.:480; I.V.:3845.2; Blood:530; IV Piggyback:1320.1] ?Out: 4563 [Urine:3590; Blood:723; Chest Tube:250] ? ? ? ?Lab Results: ?BMET ?   ?Component Value Date/Time  ? NA 132 (L) 03/25/2022 0556  ? NA 134 (L) 03/25/2022 0310  ? NA 136 03/24/2022 2118  ? NA 140 03/07/2020 1446  ? NA 140 08/02/2019 1419  ? NA 140 07/13/2019 1218  ? K 4.0 03/25/2022 0556  ? K 4.2 03/25/2022 0310  ? K 4.0 03/24/2022 2118  ? CL 103 03/25/2022 0310  ? CL 104 03/24/2022 2011  ? CL 97 (L) 03/24/2022 1312  ? CO2 25 03/25/2022 0310  ? CO2 24 03/24/2022 2011  ? CO2 27 03/24/2022 0512  ? GLUCOSE 138 (H) 03/25/2022 0310  ? GLUCOSE 161 (H) 03/24/2022 2011  ? GLUCOSE 186 (H) 03/24/2022 1312   ? BUN 6 (L) 03/25/2022 0310  ? BUN 9 03/24/2022 2011  ? BUN 10 03/24/2022 1312  ? BUN 19 03/07/2020 1446  ? BUN 15 08/02/2019 1419  ? BUN 14 07/13/2019 1218  ? CREATININE 0.47 (L) 03/25/2022 0310  ? CREATININE 0.63 03/24/2022 2011  ? CREATININE 0.40 (L) 03/24/2022 1312  ? CREATININE 1.08 04/23/2015 1443  ? CREATININE 1.00 03/15/2015 1542  ? CALCIUM 8.6 (L) 03/25/2022 0310  ? CALCIUM 8.6 (L) 03/24/2022 2011  ? CALCIUM 9.8 03/24/2022 0512  ? GFRNONAA >60 03/25/2022 0310  ? GFRNONAA >60 03/24/2022 2011  ? GFRNONAA >60 03/24/2022 0512  ? GFRNONAA 83 03/15/2015 1542  ? GFRAA 118 03/07/2020 1446  ? GFRAA 115 08/02/2019 1419  ? GFRAA 123 07/13/2019 1218  ? GFRAA >89 03/15/2015 1542  ? ?CBC ?   ?Component Value Date/Time  ? WBC 22.5 (H) 03/25/2022 0310  ? RBC 4.41 03/25/2022 0310  ? HGB 12.9 (L) 03/25/2022 0556  ? HGB 15.5 11/30/2018 1204  ? HCT 38.0 (L) 03/25/2022 0556  ? HCT 45.3 11/30/2018 1204  ? PLT 253 03/25/2022 0310  ? PLT 229 11/30/2018 1204  ? MCV 85.3 03/25/2022 0310  ? MCV 88 11/30/2018 1204  ? MCH 28.1 03/25/2022 0310  ? MCHC 33.0 03/25/2022 0310  ? RDW 14.7 03/25/2022 0310  ? RDW 12.0 (L) 11/30/2018 1204  ? LYMPHSABS  3.0 06/12/2019 1603  ? MONOABS 0.7 06/12/2019 1603  ? EOSABS 0.3 06/12/2019 1603  ? BASOSABS 0.1 06/12/2019 1603  ? ?HEPATIC Function Panel ?Recent Labs  ?  03/20/22 ?0732  ?PROT 6.1*  ? ?HEMOGLOBIN A1C ?No components found for: HGA1C,  MPG ?CARDIAC ENZYMES ?Lab Results  ?Component Value Date  ? TROPONINI 0.04 (H) 03/05/2015  ? TROPONINI 0.05 (H) 03/05/2015  ? TROPONINI 0.05 (H) 03/05/2015  ? ?BNP ?No results for input(s): PROBNP in the last 8760 hours. ?TSH ?Recent Labs  ?  03/20/22 ?0732  ?TSH 2.492  ? ?CHOLESTEROL ?Recent Labs  ?  03/15/22 ?0408  ?CHOL 165  ? ? ?Scheduled Meds: ? acetaminophen  1,000 mg Oral Q6H  ? Or  ? acetaminophen (TYLENOL) oral liquid 160 mg/5 mL  1,000 mg Per Tube Q6H  ? amiodarone  400 mg Oral BID  ? aspirin EC  325 mg Oral Daily  ? Or  ? aspirin  324 mg Per Tube Daily   ? atorvastatin  80 mg Oral q1800  ? bisacodyl  10 mg Oral Daily  ? Or  ? bisacodyl  10 mg Rectal Daily  ? Chlorhexidine Gluconate Cloth  6 each Topical Daily  ? Chlorhexidine Gluconate Cloth  6 each Topical Daily  ? docusate sodium  200 mg Oral Daily  ? fluticasone furoate-vilanterol  1 puff Inhalation Daily  ? furosemide  20 mg Intravenous BID  ? insulin aspart  0-24 Units Subcutaneous Q4H  ? insulin detemir  12 Units Subcutaneous BID  ? levalbuterol  1.25 mg Nebulization Q6H  ? metoprolol tartrate  12.5 mg Oral BID  ? Or  ? metoprolol tartrate  12.5 mg Per Tube BID  ? montelukast  10 mg Per Tube QHS  ? [START ON 03/26/2022] pantoprazole  40 mg Oral Daily  ? sodium chloride flush  10-40 mL Intracatheter Q12H  ? sodium chloride flush  3 mL Intravenous Q12H  ? umeclidinium bromide  1 puff Inhalation Daily  ? ?Continuous Infusions: ? sodium chloride Stopped (03/25/22 0105)  ? sodium chloride    ? sodium chloride    ? albumin human 12.5 g (03/24/22 1608)  ?  ceFAZolin (ANCEF) IV Stopped (03/25/22 1324)  ? insulin 3 Units/hr (03/25/22 1200)  ? lactated ringers    ? lactated ringers 10 mL/hr at 03/25/22 1200  ? milrinone 0.25 mcg/kg/min (03/25/22 1200)  ? nitroGLYCERIN 0 mcg/min (03/24/22 1430)  ? norepinephrine (LEVOPHED) Adult infusion Stopped (03/25/22 0520)  ? ?PRN Meds:.sodium chloride, albumin human, dextrose, levalbuterol, metoprolol tartrate, midazolam, morphine injection, ondansetron (ZOFRAN) IV, oxyCODONE, sodium chloride flush, sodium chloride flush, traMADol ? ?Assessment/Plan: ? Acute coronary syndrome ?Multivessel CAD ?CABG, 2 vessels, day 1 ?HTN ?Morbid obesity ?Paroxysmal atrial fibrillation ? ?Plan: ?Follow with CT surgery/ ? ? LOS: 11 days  ? ?Time spent including chart review, lab review, examination, discussion with patient/Nurse : 30 min ? ? ?[name]  MD  ?03/25/2022, 12:22 PM ? ? ? ? ?

## 2022-03-25 NOTE — Progress Notes (Signed)
1 Day Post-Op Procedure(s) (LRB): ?CORONARY ARTERY BYPASS GRAFTING (CABG) TIMES TWO USING LEFT INTERNAL MAMMARY ARTERY AND ENDOSCOPICALLY HARVESTED RIGHT GREATER SAPHENOUS VEIN (N/A) ?TRANSESOPHAGEAL ECHOCARDIOGRAM (TEE) (N/A) ?ENDOVEIN HARVEST OF GREATER SAPHENOUS VEIN (Right) ?Subjective: ?Breathing well ?Nsr on iv amiodarone ?Objective: ?Vital signs in last 24 hours: ?Temp:  [97 ?F (36.1 ?C)-98.6 ?F (37 ?C)] 98.1 ?F (36.7 ?C) (04/12 0700) ?Pulse Rate:  [76-105] 88 (04/12 0700) ?Cardiac Rhythm: Atrial paced (04/12 0400) ?Resp:  [0-58] 19 (04/12 0700) ?BP: (98-130)/(53-78) 128/78 (04/12 0700) ?SpO2:  [90 %-99 %] 95 % (04/12 0700) ?Arterial Line BP: (87-244)/(38-118) 164/61 (04/12 0700) ?FiO2 (%):  [40 %-50 %] 40 % (04/11 2000) ?Weight:  [144.4 kg] 144.4 kg (04/12 0500) ? ?Hemodynamic parameters for last 24 hours: ?PAP: (19-80)/(9-39) 30/14 ?CVP:  [9 mmHg-15 mmHg] 15 mmHg ?CO:  [4.5 L/min-9.6 L/min] 6.1 L/min ?CI:  [1.7 L/min/m2-3.7 L/min/m2] 2.4 L/min/m2 ? ?Intake/Output from previous day: ?04/11 0701 - 04/12 0700 ?In: 6175.3 [P.O.:480; I.V.:3845.2; Blood:530; IV Piggyback:1320.1] ?Out: 4563 [Urine:3590; Blood:723; Chest Tube:250] ?Intake/Output this shift: ?No intake/output data recorded. ? ?  ?   Exam ? ?  General- alert and comfortable ?   Neck- no JVD, no cervical adenopathy palpable, no carotid bruit ?  Lungs- clear without rales, wheezes ?  Cor- regular rate and rhythm, no murmur , gallop ?  Abdomen- soft, non-tender ?  Extremities - warm, non-tender, minimal edema ?  Neuro- oriented, appropriate, no focal weakness ? ? ?Lab Results: ?Recent Labs  ?  03/24/22 ?2011 03/24/22 ?2118 03/25/22 ?0310 03/25/22 ?9379  ?WBC 20.8*  --  22.5*  --   ?HGB 12.9*   < > 12.4* 12.9*  ?HCT 38.6*   < > 37.6* 38.0*  ?PLT 246  --  253  --   ? < > = values in this interval not displayed.  ? ?BMET:  ?Recent Labs  ?  03/24/22 ?2011 03/24/22 ?2118 03/25/22 ?0310 03/25/22 ?0240  ?NA 136   < > 134* 132*  ?K 4.1   < > 4.2 4.0  ?CL 104   --  103  --   ?CO2 24  --  25  --   ?GLUCOSE 161*  --  138*  --   ?BUN 9  --  6*  --   ?CREATININE 0.63  --  0.47*  --   ?CALCIUM 8.6*  --  8.6*  --   ? < > = values in this interval not displayed.  ?  ?PT/INR:  ?Recent Labs  ?  03/24/22 ?1439  ?LABPROT 15.9*  ?INR 1.3*  ? ?ABG ?   ?Component Value Date/Time  ? PHART 7.416 03/25/2022 0556  ? HCO3 25.1 03/25/2022 0556  ? TCO2 26 03/25/2022 0556  ? ACIDBASEDEF 1.0 03/24/2022 2118  ? O2SAT 96 03/25/2022 0556  ? ?CBG (last 3)  ?Recent Labs  ?  03/25/22 ?0411 03/25/22 ?0602 03/25/22 ?0803  ?GLUCAP 134* 112* 129*  ? ? ?Assessment/Plan: ?S/P Procedure(s) (LRB): ?CORONARY ARTERY BYPASS GRAFTING (CABG) TIMES TWO USING LEFT INTERNAL MAMMARY ARTERY AND ENDOSCOPICALLY HARVESTED RIGHT GREATER SAPHENOUS VEIN (N/A) ?TRANSESOPHAGEAL ECHOCARDIOGRAM (TEE) (N/A) ?ENDOVEIN HARVEST OF GREATER SAPHENOUS VEIN (Right) ?Mobilize ?Diuresis ?Diabetes control ?See progression orders ?Transition to po amiodarone ? ? LOS: 11 days  ? ? ?Lovett Sox ?03/25/2022 ?  ?

## 2022-03-25 NOTE — Progress Notes (Signed)
? ?   ?  [address] ?      [place] [address] ?            [phone]   ? ?  ?POD # 1 CABG ? ?Up in chair, no complaints ?In A fib with VR 100-120 ? ?BP 106/65 (BP Location: Left Arm)   Pulse 95   Temp 98.2 ?F (36.8 ?C)   Resp 17   Ht 6\' 1"  (1.854 m)   Wt (!) 144.4 kg   SpO2 92%   BMI 42.00 kg/m?  ?Milrinone 0.25 ? ? ?Intake/Output Summary (Last 24 hours) at 03/25/2022 1821 ?Last data filed at 03/25/2022 1800 ?Gross per 24 hour  ?Intake 3564.27 ml  ?Output 3875 ml  ?Net -310.73 ml  ? ?Hct 39, K 4.5, creatinine 0.6 ? ?Rebolused with amiodarone and drip increased to 1 mg/min ?Will increase PO metoprolol to 25 BID and give early ? ?05/25/2022 Salvatore Decent, MD ?[facility] ?[phone] ? ?

## 2022-03-25 NOTE — Evaluation (Signed)
Physical Therapy Evaluation ?Patient Details ?Name: Arthur Church ?MRN: [id number] ?DOB: 01-Mar-1956 ?Today's Date: 03/25/2022 ? ?History of Present Illness ? Pt is a 66 y.o. M who presents with chest pain. Admitted with acute coronary syndrome and NSTEMI  now s/p CABG x 2 4/11. Significant PMH: obesity, PAF, COPD, DM2.  ?Clinical Impression ? PTA, pt lives alone and is independent; plans to d/c home with his daughter initially. Pt presents with decreased functional mobility secondary to pain, impaired balance, decreased activity tolerance, and weakness. Pt requiring two person moderate assist for bed mobility; progressing from bed to chair with Eva walker and min guard assist. HR 98-120's, SpO2 > 90% on 2L O2. Suspect steady progress. ?   ? ?Recommendations for follow up therapy are one component of a multi-disciplinary discharge planning process, led by the attending physician.  Recommendations may be updated based on patient status, additional functional criteria and insurance authorization. ? ?Follow Up Recommendations Home health PT (pending progress) ? ?  ?Assistance Recommended at Discharge Frequent or constant Supervision/Assistance  ?Patient can return home with the following ? A little help with walking and/or transfers;A little help with bathing/dressing/bathroom;Assistance with cooking/housework;Assist for transportation;Help with stairs or ramp for entrance ? ?  ?Equipment Recommendations Rolling walker (2 wheels);BSC/3in1 (bariatric equipment)  ?Recommendations for Other Services ?    ?  ?Functional Status Assessment Patient has had a recent decline in their functional status and demonstrates the ability to make significant improvements in function in a reasonable and predictable amount of time.  ? ?  ?Precautions / Restrictions Precautions ?Precautions: Sternal;Fall;Other (comment) ?Precaution Booklet Issued: No ?Precaution Comments: chest tube, [place] ?Restrictions ?Weight Bearing Restrictions:  Yes ?Other Position/Activity Restrictions: sternal precautions  ? ?  ? ?Mobility ? Bed Mobility ?Overal bed mobility: Needs Assistance ?Bed Mobility: Supine to Sit ?  ?  ?Supine to sit: Mod assist, +2 for physical assistance ?  ?  ?General bed mobility comments: Initiating bringing BLE's to edge of bed, truncal assist to upright ?  ? ?Transfers ?Overall transfer level: Needs assistance ?Equipment used: None ?Transfers: Sit to/from Stand ?Sit to Stand: Min assist ?  ?  ?  ?  ?  ?General transfer comment: cues for rocking to gain momentum, holding onto sternal pillow, minA for power up ?  ? ?Ambulation/Gait ?Ambulation/Gait assistance: Min guard ?Gait Distance (Feet): 3 Feet ?Assistive device: Ethelene Hal ?Gait Pattern/deviations: Step-through pattern, Decreased stride length ?  ?  ?  ?General Gait Details: Pivotal steps bed to chair, min guard for safety ? ?Stairs ?  ?  ?  ?  ?  ? ?Wheelchair Mobility ?  ? ?Modified Rankin (Stroke Patients Only) ?  ? ?  ? ?Balance Overall balance assessment: Needs assistance ?Sitting-balance support: Feet supported ?Sitting balance-Leahy Scale: Fair ?  ?  ?Standing balance support: Bilateral upper extremity supported ?Standing balance-Leahy Scale: Fair ?  ?  ?  ?  ?  ?  ?  ?  ?  ?  ?  ?  ?   ? ? ? ?Pertinent Vitals/Pain Pain Assessment ?Pain Assessment: 0-10 ?Pain Score: 4  ?Pain Location: surgical site, neck ?Pain Descriptors / Indicators: Operative site guarding ?Pain Intervention(s): Monitored during session  ? ? ?Home Living Family/patient expects to be discharged to:: Private residence ?Living Arrangements: Alone ?Available Help at Discharge: Family ?Type of Home: House ?Home Access: Stairs to enter ?  ?Entrance Stairs-Number of Steps: 3 ?  ?Home Layout: Able to live on main level with bedroom/bathroom ?Home  Equipment: Shower seat ?Additional Comments: Plans to d/c with daughter initially (her home info listed above)  ?  ?Prior Function Prior Level of Function :  Independent/Modified Independent ?  ?  ?  ?  ?  ?  ?Mobility Comments: Takes care of grandchildren i.e. picking up from school, cooking dinner ?  ?  ? ? ?Hand Dominance  ? Dominant Hand: Right ? ?  ?Extremity/Trunk Assessment  ? Upper Extremity Assessment ?Upper Extremity Assessment: Defer to OT evaluation ?  ? ?Lower Extremity Assessment ?Lower Extremity Assessment: RLE deficits/detail;LLE deficits/detail ?RLE Deficits / Details: grossly 4/5 ?LLE Deficits / Details: grossly 4/5 ?  ? ?   ?Communication  ? Communication: No difficulties  ?Cognition Arousal/Alertness: Awake/alert ?Behavior During Therapy: [facility] for tasks assessed/performed ?Overall Cognitive Status: Within Functional Limits for tasks assessed ?  ?  ?  ?  ?  ?  ?  ?  ?  ?  ?  ?  ?  ?  ?  ?  ?  ?  ?  ? ?  ?General Comments   ? ?  ?Exercises General Exercises - Lower Extremity ?Ankle Circles/Pumps: Both, 15 reps, Supine ?Quad Sets: Both, 15 reps, Supine ?Heel Slides: Both, 10 reps, Supine  ? ?Assessment/Plan  ?  ?PT Assessment Patient needs continued PT services  ?PT Problem List Decreased strength;Decreased activity tolerance;Decreased balance;Decreased mobility;Pain ? ?   ?  ?PT Treatment Interventions DME instruction;Gait training;Functional mobility training;Therapeutic activities;Stair training;Therapeutic exercise;Balance training;Patient/family education   ? ?PT Goals (Current goals can be found in the Care Plan section)  ?Acute Rehab PT Goals ?Patient Stated Goal: to improve ?PT Goal Formulation: With patient ?Time For Goal Achievement: 04/08/22 ?Potential to Achieve Goals: Good ? ?  ?Frequency Min 3X/week ?  ? ? ?Co-evaluation   ?  ?  ?  ?  ? ? ?  ?AM-PAC PT "6 Clicks" Mobility  ?Outcome Measure Help needed turning from your back to your side while in a flat bed without using bedrails?: A Little ?Help needed moving from lying on your back to sitting on the side of a flat bed without using bedrails?: A Lot ?Help needed moving to and from a bed to a  chair (including a wheelchair)?: A Little ?Help needed standing up from a chair using your arms (e.g., wheelchair or bedside chair)?: A Little ?Help needed to walk in hospital room?: A Little ?Help needed climbing 3-5 steps with a railing? : Total ?6 Click Score: 15 ? ?  ?End of Session Equipment Utilized During Treatment: Oxygen ?Activity Tolerance: Patient tolerated treatment well ?Patient left: in chair;with call bell/phone within reach ?Nurse Communication: Mobility status ?PT Visit Diagnosis: Unsteadiness on feet (R26.81);Difficulty in walking, not elsewhere classified (R26.2);Pain ?Pain - part of body:  (chest) ?  ? ?Time: [id number] ?PT Time Calculation (min) (ACUTE ONLY): 39 min ? ? ?Charges:   PT Evaluation ?$PT Eval Moderate Complexity: 1 Mod ?PT Treatments ?$Therapeutic Activity: 23-37 mins ?  ?   ? ? ?Wyona Almas, PT, DPT ?Acute Rehabilitation Services ?Pager [phone] ?Office [phone] ? ? ?Arthur Church ?03/25/2022, 4:22 PM ? ?

## 2022-03-26 ENCOUNTER — Inpatient Hospital Stay (HOSPITAL_COMMUNITY): Payer: Medicare (Managed Care)

## 2022-03-26 LAB — BASIC METABOLIC PANEL
Anion gap: 6 (ref 5–15)
BUN: 9 mg/dL (ref 8–23)
CO2: 30 mmol/L (ref 22–32)
Calcium: 9.1 mg/dL (ref 8.9–10.3)
Chloride: 96 mmol/L — ABNORMAL LOW (ref 98–111)
Creatinine, Ser: 0.65 mg/dL (ref 0.61–1.24)
GFR, Estimated: >60 mL/min (ref >60)
Glucose, Bld: 149 mg/dL — ABNORMAL HIGH (ref 70–99)
Potassium: 4.3 mmol/L (ref 3.5–5.1)
Sodium: 132 mmol/L — ABNORMAL LOW (ref 135–145)

## 2022-03-26 LAB — COOXEMETRY PANEL
Carboxyhemoglobin: 1 % (ref 0.5–1.5)
Methemoglobin: <0.7 % (ref 0.0–1.5)
O2 Saturation: 65.8 %
Total hemoglobin: 12.9 g/dL (ref 12.0–16.0)

## 2022-03-26 LAB — CBC
HCT: 37.7 % — ABNORMAL LOW (ref 39.0–52.0)
Hemoglobin: 12.4 g/dL — ABNORMAL LOW (ref 13.0–17.0)
MCH: 28.4 pg (ref 26.0–34.0)
MCHC: 32.9 g/dL (ref 30.0–36.0)
MCV: 86.3 fL (ref 80.0–100.0)
Platelets: 215 10*3/uL (ref 150–400)
RBC: 4.37 MIL/uL (ref 4.22–5.81)
RDW: 15 % (ref 11.5–15.5)
WBC: 19 10*3/uL — ABNORMAL HIGH (ref 4.0–10.5)
nRBC: 0 % (ref 0.0–0.2)

## 2022-03-26 LAB — GLUCOSE, CAPILLARY
Glucose-Capillary: 170 mg/dL — ABNORMAL HIGH (ref 70–99)
Glucose-Capillary: 165 mg/dL — ABNORMAL HIGH (ref 70–99)
Glucose-Capillary: 188 mg/dL — ABNORMAL HIGH (ref 70–99)
Glucose-Capillary: 147 mg/dL — ABNORMAL HIGH (ref 70–99)
Glucose-Capillary: 156 mg/dL — ABNORMAL HIGH (ref 70–99)
Glucose-Capillary: 192 mg/dL — ABNORMAL HIGH (ref 70–99)

## 2022-03-26 MED ORDER — HYDROXYZINE HCL 25 MG PO TABS: 25.0000 mg | ORAL_TABLET | Freq: Three times a day (TID) | ORAL | Status: DC | PRN

## 2022-03-26 MED ORDER — SODIUM CHLORIDE 0.9 % IV SOLN
12.5000 mg | Freq: Four times a day (QID) | INTRAVENOUS | Status: DC | PRN
  Administered 2022-03-26 (×2): 12.5 mg via INTRAVENOUS
  Filled 2022-03-26: qty 0.5
  Filled 2022-03-26: qty 12.5

## 2022-03-26 MED ORDER — MIDAZOLAM HCL 2 MG/2ML IJ SOLN: 2.0000 mg | Freq: Four times a day (QID) | INTRAMUSCULAR | Status: DC | PRN

## 2022-03-26 MED ORDER — DIGOXIN 125 MCG PO TABS
0.2500 mg | ORAL_TABLET | Freq: Every day | ORAL | Status: DC
Start: 2022-03-27 — End: 2022-04-02
  Administered 2022-03-27 – 2022-04-02 (×7): 0.25 mg via ORAL
  Filled 2022-03-26 (×7): qty 2

## 2022-03-26 MED ORDER — METOCLOPRAMIDE HCL 5 MG/ML IJ SOLN
10.0000 mg | Freq: Four times a day (QID) | INTRAMUSCULAR | Status: DC
  Administered 2022-03-26 – 2022-03-27 (×4): 10 mg via INTRAVENOUS
  Filled 2022-03-26 (×4): qty 2

## 2022-03-26 MED ORDER — METOPROLOL TARTRATE 25 MG/10 ML ORAL SUSPENSION: 25.0000 mg | Freq: Four times a day (QID) | ORAL | Status: DC

## 2022-03-26 MED ORDER — DILTIAZEM HCL-DEXTROSE 125-5 MG/125ML-% IV SOLN (PREMIX)
5.0000 mg/h | INTRAVENOUS | Status: DC
  Filled 2022-03-26: qty 125

## 2022-03-26 MED ORDER — MORPHINE SULFATE (PF) 2 MG/ML IV SOLN: 1.0000 mg | INTRAVENOUS | Status: DC | PRN

## 2022-03-26 MED ORDER — METOPROLOL TARTRATE 25 MG PO TABS: 25.0000 mg | ORAL_TABLET | Freq: Four times a day (QID) | ORAL | Status: DC

## 2022-03-26 MED ORDER — TEMAZEPAM 15 MG PO CAPS
30.0000 mg | ORAL_CAPSULE | Freq: Every evening | ORAL | Status: DC | PRN
  Administered 2022-03-26 – 2022-04-01 (×7): 30 mg via ORAL
  Filled 2022-03-26 (×7): qty 2

## 2022-03-26 MED ORDER — LEVALBUTEROL HCL 1.25 MG/0.5ML IN NEBU: 1.2500 mg | INHALATION_SOLUTION | Freq: Two times a day (BID) | RESPIRATORY_TRACT | Status: DC

## 2022-03-26 MED ORDER — TEMAZEPAM 15 MG PO CAPS: 30.0000 mg | ORAL_CAPSULE | Freq: Every day | ORAL | Status: DC

## 2022-03-26 MED ORDER — FUROSEMIDE 10 MG/ML IJ SOLN
40.0000 mg | Freq: Two times a day (BID) | INTRAMUSCULAR | Status: DC
  Administered 2022-03-26 – 2022-03-29 (×7): 40 mg via INTRAVENOUS
  Filled 2022-03-26 (×7): qty 4

## 2022-03-26 MED ORDER — METOPROLOL TARTRATE 25 MG PO TABS
25.0000 mg | ORAL_TABLET | Freq: Four times a day (QID) | ORAL | Status: DC
  Filled 2022-03-26: qty 1

## 2022-03-26 MED ORDER — SORBITOL 70 % SOLN
60.0000 mL | Freq: Once | Status: AC
  Administered 2022-03-26: 60 mL via ORAL
  Filled 2022-03-26: qty 60

## 2022-03-26 MED ORDER — DILTIAZEM HCL-DEXTROSE 125-5 MG/125ML-% IV SOLN (PREMIX)
5.0000 mg/h | INTRAVENOUS | Status: AC
Start: 2022-03-26 — End: 2022-03-29
  Administered 2022-03-26 – 2022-03-28 (×3): 5 mg/h via INTRAVENOUS
  Filled 2022-03-26 (×4): qty 125

## 2022-03-26 MED ORDER — AMIODARONE IV BOLUS ONLY 150 MG/100ML
150.0000 mg | Freq: Once | INTRAVENOUS | Status: AC
  Administered 2022-03-26: 150 mg via INTRAVENOUS

## 2022-03-26 MED ORDER — DIGOXIN 0.25 MG/ML IJ SOLN
0.2500 mg | Freq: Once | INTRAMUSCULAR | Status: AC
  Administered 2022-03-26: 0.25 mg via INTRAVENOUS
  Filled 2022-03-26: qty 2

## 2022-03-26 MED ORDER — INSULIN ASPART 100 UNIT/ML IJ SOLN
0.0000 [IU] | Freq: Three times a day (TID) | INTRAMUSCULAR | Status: DC
  Administered 2022-03-26: 4 [IU] via SUBCUTANEOUS
  Administered 2022-03-27: 2 [IU] via SUBCUTANEOUS
  Administered 2022-03-27: 8 [IU] via SUBCUTANEOUS
  Administered 2022-03-27 – 2022-03-30 (×11): 2 [IU] via SUBCUTANEOUS

## 2022-03-26 MED ORDER — AMIODARONE IV BOLUS ONLY 150 MG/100ML
150.0000 mg | Freq: Once | INTRAVENOUS | Status: AC
Start: 2022-03-26 — End: 2022-03-26
  Administered 2022-03-26: 150 mg via INTRAVENOUS

## 2022-03-26 MED FILL — Potassium Chloride Inj 2 mEq/ML: INTRAVENOUS | Qty: 40 | Status: AC

## 2022-03-26 MED FILL — Heparin Sodium (Porcine) Inj 1000 Unit/ML: Qty: 1000 | Status: AC

## 2022-03-26 MED FILL — Magnesium Sulfate Inj 50%: INTRAMUSCULAR | Qty: 20 | Status: AC

## 2022-03-26 NOTE — Progress Notes (Signed)
2 Days Post-Op Procedure(s) (LRB): ?CORONARY ARTERY BYPASS GRAFTING (CABG) TIMES TWO USING LEFT INTERNAL MAMMARY ARTERY AND ENDOSCOPICALLY HARVESTED RIGHT GREATER SAPHENOUS VEIN (N/A) ?TRANSESOPHAGEAL ECHOCARDIOGRAM (TEE) (N/A) ?ENDOVEIN HARVEST OF GREATER SAPHENOUS VEIN (Right) ?Subjective: ?Reamains in afib ?Nausea on amiodarone- will transition to [place] [ on peop] ? ?Objective: ?Vital signs in last 24 hours: ?Temp:  [97.9 ?F (36.6 ?C)-98.2 ?F (36.8 ?C)] 97.9 ?F (36.6 ?C) (04/13 0324) ?Pulse Rate:  [67-117] 106 (04/13 0630) ?Cardiac Rhythm: Atrial fibrillation (04/13 0400) ?Resp:  [14-27] 17 (04/13 0630) ?BP: (97-154)/(53-122) 124/74 (04/13 0630) ?SpO2:  [91 %-96 %] 95 % (04/13 0630) ?Arterial Line BP: (65-161)/(57-82) 87/82 (04/12 1400) ?Weight:  [141.4 kg] 141.4 kg (04/13 0407) ? ?Hemodynamic parameters for last 24 hours: ?PAP: (32-35)/(15-16) 35/16 ?CO:  [6.9 L/min] 6.9 L/min ?CI:  [2.7 L/min/m2] 2.7 L/min/m2 ? ?Intake/Output from previous day: ?04/12 0701 - 04/13 0700 ?In: 2319.5 [P.O.:960; I.V.:1059.3; IV Piggyback:300.1] ?Out: [id number]; Chest Tube:290] ?Intake/Output this shift: ?No intake/output data recorded. ? ?  ?   Exam ? ?  General- alert and comfortable ?   Neck- no JVD, no cervical adenopathy palpable, no carotid bruit ?  Lungs- clear without rales, wheezes ?  Cor- regular rate and rhythm, no murmur , gallop ?  Abdomen- soft, non-tender ?  Extremities - warm, non-tender, minimal edema ?  Neuro- oriented, appropriate, no focal weakness ? ? ?Lab Results: ?Recent Labs  ?  03/25/22 ?1618 03/26/22 ?0353  ?WBC 18.9* 19.0*  ?HGB 12.3* 12.4*  ?HCT 39.5 37.7*  ?PLT 221 215  ? ?BMET:  ?Recent Labs  ?  03/25/22 ?1618 03/26/22 ?0353  ?NA 133* 132*  ?K 4.5 4.3  ?CL 99 96*  ?CO2 26 30  ?GLUCOSE 172* 149*  ?BUN 7* 9  ?CREATININE 0.61 0.65  ?CALCIUM 9.0 9.1  ?  ?PT/INR:  ?Recent Labs  ?  03/24/22 ?1439  ?LABPROT 15.9*  ?INR 1.3*  ? ?ABG ?   ?Component Value Date/Time  ? PHART 7.416 03/25/2022 0556  ? HCO3  25.1 03/25/2022 0556  ? TCO2 26 03/25/2022 0556  ? ACIDBASEDEF 1.0 03/24/2022 2118  ? O2SAT 65.8 03/26/2022 0402  ? ?CBG (last 3)  ?Recent Labs  ?  03/25/22 ?1951 03/25/22 ?2350 03/26/22 ?0322  ?GLUCAP 193* 188* 170*  ? ? ?Assessment/Plan: ?S/P Procedure(s) (LRB): ?CORONARY ARTERY BYPASS GRAFTING (CABG) TIMES TWO USING LEFT INTERNAL MAMMARY ARTERY AND ENDOSCOPICALLY HARVESTED RIGHT GREATER SAPHENOUS VEIN (N/A) ?TRANSESOPHAGEAL ECHOCARDIOGRAM (TEE) (N/A) ?ENDOVEIN HARVEST OF GREATER SAPHENOUS VEIN (Right) ?Mobilize ?Diuresis ?d/c tubes/lines ?Ask pharmD to transition to tikosyn ? ? LOS: 12 days  ? ? ?Dahlia Byes ?03/26/2022 ?  ?

## 2022-03-26 NOTE — Evaluation (Signed)
Occupational Therapy Evaluation ?Patient Details ?Name: Arthur Church ?MRN: [id number] ?DOB: 09-09-1956 ?Today's Date: 03/26/2022 ? ? ?History of Present Illness 66 y.o. M who presents with chest pain. Admitted with acute coronary syndrome and NSTEMI  now s/p CABG x 2 4/11. Significant PMH: obesity, PAF, COPD, DM2.  ? ?Clinical Impression ?  ?PTA, pt was living alone and was independent; enjoys watching his two grandson's while his daughter is at work.  Pt currently requiring Mod-Max A for UB ADLs, Max A for LB ADLs, and Min Guard-Min A for functional mobility. Pt performing functional mobility in hallway (smaller loop) with eva walker and Min Guard A +2; chair follow for safety. HR maintaining 120-130s and SpO2 90s on 3L during mobility. Pt would benefit from further acute OT to facilitate safe dc. Recommend dc to home with HHOT for further OT to optimize safety, independence with ADLs, and return to PLOF.  ?   ? ?Recommendations for follow up therapy are one component of a multi-disciplinary discharge planning process, led by the attending physician.  Recommendations may be updated based on patient status, additional functional criteria and insurance authorization.  ? ?Follow Up Recommendations ? Home health OT  ?  ?Assistance Recommended at Discharge Frequent or constant Supervision/Assistance  ?Patient can return home with the following   ? ?  ?Functional Status Assessment ? Patient has had a recent decline in their functional status and demonstrates the ability to make significant improvements in function in a reasonable and predictable amount of time.  ?Equipment Recommendations ? Tub/shower seat;BSC/3in1  ?  ?Recommendations for Other Services   ? ? ?  ?Precautions / Restrictions Precautions ?Precautions: Sternal;Fall;Other (comment) ?Precaution Comments: chest tube, [place] ?Restrictions ?Weight Bearing Restrictions: Yes (sternal precautions) ?Other Position/Activity Restrictions: sternal precautions  ? ?   ? ?Mobility Bed Mobility ?Overal bed mobility: Needs Assistance ?Bed Mobility: Rolling, Sit to Sidelying ?Rolling: Max assist, +2 for physical assistance ?  ?  ?  ?Sit to sidelying: Max assist, +2 for physical assistance ?General bed mobility comments: Max A for log roll and safety ?  ? ?Transfers ?Overall transfer level: Needs assistance ?  ?Transfers: Sit to/from Stand ?Sit to Stand: Min guard, Min assist ?  ?  ?  ?  ?  ?General transfer comment: Min A for correcting posterior lean in standing; once upright, able to maintain balance. Min Guard A for safety with descent ?  ? ?  ?Balance Overall balance assessment: Needs assistance ?Sitting-balance support: No upper extremity supported, Feet supported ?Sitting balance-Leahy Scale: Fair ?  ?  ?Standing balance support: Bilateral upper extremity supported, During functional activity ?Standing balance-Leahy Scale: Fair ?  ?  ?  ?  ?  ?  ?  ?  ?  ?  ?  ?  ?   ? ?ADL either performed or assessed with clinical judgement  ? ?ADL Overall ADL's : Needs assistance/impaired ?Eating/Feeding: Set up;Sitting ?  ?Grooming: Set up;Supervision/safety;Sitting ?  ?Upper Body Bathing: Moderate assistance;Sitting ?  ?Lower Body Bathing: Maximal assistance;Sit to/from stand ?  ?Upper Body Dressing : Maximal assistance;Sitting ?  ?Lower Body Dressing: Maximal assistance;Sit to/from stand ?  ?Toilet Transfer: Minimal assistance;Min guard;Ambulation (eva walker) ?  ?  ?  ?  ?  ?Functional mobility during ADLs: Minimal assistance (eva walker) ?General ADL Comments: Pt currently presenitng with decreased strength, balance, and activity tolerance. Pt also with nausea  ? ? ? ?Vision Baseline Vision/History: 1 Wears glasses ?   ?   ?Perception   ?  ?  Praxis   ?  ? ?Pertinent Vitals/Pain Pain Assessment ?Pain Assessment: Faces ?Faces Pain Scale: Hurts little more ?Pain Location: surgical site, neck ?Pain Descriptors / Indicators: Operative site guarding ?Pain Intervention(s): Monitored during  session, Limited activity within patient's tolerance, Repositioned  ? ? ? ?Hand Dominance Right ?  ?Extremity/Trunk Assessment Upper Extremity Assessment ?Upper Extremity Assessment: Generalized weakness (increased edema) ?  ?Lower Extremity Assessment ?Lower Extremity Assessment: Defer to PT evaluation ?  ?  ?  ?Communication Communication ?Communication: No difficulties ?  ?Cognition Arousal/Alertness: Awake/alert ?Behavior During Therapy: [facility] for tasks assessed/performed ?Overall Cognitive Status: Within Functional Limits for tasks assessed ?  ?  ?  ?  ?  ?  ?  ?  ?  ?  ?  ?  ?  ?  ?  ?  ?  ?  ?  ?General Comments  VSS on 3L ? ?  ?Exercises Exercises: General Lower Extremity ?General Exercises - Lower Extremity ?Long Arc Quad: Both, 20 reps, Seated ?  ?Shoulder Instructions    ? ? ?Home Living Family/patient expects to be discharged to:: Private residence ?Living Arrangements: Alone ?Available Help at Discharge: Family ?Type of Home: House ?Home Access: Stairs to enter ?Entrance Stairs-Number of Steps: 3 ?  ?Home Layout: Able to live on main level with bedroom/bathroom ?  ?  ?Bathroom Shower/Tub: Walk-in shower ?  ?Bathroom Toilet: Standard ?  ?  ?Home Equipment: Shower seat ?  ?Additional Comments: Plans to d/c with daughter initially (her home info listed above) ?  ? ?  ?Prior Functioning/Environment Prior Level of Function : Independent/Modified Independent ?  ?  ?  ?  ?  ?  ?  ?ADLs Comments: Takes care of grandchildren i.e. picking up from school, cooking dinner ?  ? ?  ?  ?OT Problem List:   ?  ?   ?OT Treatment/Interventions:    ?  ?OT Goals(Current goals can be found in the care plan section) Acute Rehab OT Goals ?Patient Stated Goal: Go home ?OT Goal Formulation: With patient ?Time For Goal Achievement: 04/09/22 ?Potential to Achieve Goals: Good  ?OT Frequency: Min 2X/week ?  ? ?Co-evaluation   ?  ?  ?  ?  ? ?  ?AM-PAC OT "6 Clicks" Daily Activity     ?Outcome Measure Help from another person eating  meals?: A Little ?Help from another person taking care of personal grooming?: A Little ?Help from another person toileting, which includes using toliet, bedpan, or urinal?: A Lot ?Help from another person bathing (including washing, rinsing, drying)?: A Lot ?Help from another person to put on and taking off regular upper body clothing?: A Lot ?Help from another person to put on and taking off regular lower body clothing?: A Lot ?6 Click Score: 14 ?  ?End of Session Equipment Utilized During Treatment: Oxygen ?Nurse Communication: Mobility status ? ?Activity Tolerance: Patient tolerated treatment well ?Patient left: in bed;with call bell/phone within reach;with nursing/sitter in room ? ?OT Visit Diagnosis: Unsteadiness on feet (R26.81);Other abnormalities of gait and mobility (R26.89);Muscle weakness (generalized) (M62.81)  ?              ?Time: 7322-0254 ?OT Time Calculation (min): 32 min ?Charges:  OT General Charges ?$OT Visit: 1 Visit ?OT Evaluation ?$OT Eval Moderate Complexity: 1 Mod ?OT Treatments ?$Self Care/Home Management : 8-22 mins ? ?Kimi Bordeau MSOT, OTR/L ?Acute Rehab ?Pager: [phone] ?Office: [phone] ? ?Ennio Houp M Divon Krabill ?03/26/2022, 12:35 PM ?

## 2022-03-26 NOTE — Progress Notes (Signed)
Ref: Andria Frames, PA-C ? ? ?Subjective:  ?Awake. Sitting up. Atrial fibrillation with moderate LV control continues. ? ?Objective:  ?Vital Signs in the last 24 hours: ?Temp:  [96.6 ?F (35.9 ?C)-99 ?F (37.2 ?C)] 98.7 ?F (37.1 ?C) (04/13 1612) ?Pulse Rate:  [67-117] 104 (04/13 1715) ?Cardiac Rhythm: Atrial fibrillation (04/13 0800) ?Resp:  [13-27] 16 (04/13 1715) ?BP: (97-145)/(57-129) 145/86 (04/13 1700) ?SpO2:  [91 %-97 %] 93 % (04/13 1715) ?Weight:  [141.4 kg] 141.4 kg (04/13 0407) ? ?Physical Exam: ?BP Readings from Last 1 Encounters:  ?03/26/22 (!) 145/86  ?   ?Wt Readings from Last 1 Encounters:  ?03/26/22 (!) 141.4 kg  ?  Weight change: -3 kg Body mass index is 41.13 kg/m?. ?HEENT: [place]/AT, Eyes-Blue, Conjunctiva-Pink, Sclera-Non-icteric ?Neck: No JVD, No bruit, Trachea midline. ?Lungs:  Clear, Bilateral. ?Cardiac:  Irregular rhythm, normal S1 and S2, no S3. II/VI systolic murmur. ?Abdomen:  Soft, non-tender. BS present. ?Extremities:  No edema present. No cyanosis. No clubbing. ?CNS: AxOx3, Cranial nerves grossly intact, moves all 4 extremities.  ?Skin: Warm and dry. ? ? ?Intake/Output from previous day: ?04/12 0701 - 04/13 0700 ?In: 2319.5 [P.O.:960; I.V.:1059.3; IV Piggyback:300.1] ?Out: [id number]; Chest Tube:290] ? ? ? ?Lab Results: ?BMET ?   ?Component Value Date/Time  ? NA 132 (L) 03/26/2022 0353  ? NA 133 (L) 03/25/2022 1618  ? NA 132 (L) 03/25/2022 0556  ? NA 140 03/07/2020 1446  ? NA 140 08/02/2019 1419  ? NA 140 07/13/2019 1218  ? K 4.3 03/26/2022 0353  ? K 4.5 03/25/2022 1618  ? K 4.0 03/25/2022 0556  ? CL 96 (L) 03/26/2022 0353  ? CL 99 03/25/2022 1618  ? CL 103 03/25/2022 0310  ? CO2 30 03/26/2022 0353  ? CO2 26 03/25/2022 1618  ? CO2 25 03/25/2022 0310  ? GLUCOSE 149 (H) 03/26/2022 0353  ? GLUCOSE 172 (H) 03/25/2022 1618  ? GLUCOSE 138 (H) 03/25/2022 0310  ? BUN 9 03/26/2022 0353  ? BUN 7 (L) 03/25/2022 1618  ? BUN 6 (L) 03/25/2022 0310  ? BUN 19 03/07/2020 1446  ? BUN 15 08/02/2019  1419  ? BUN 14 07/13/2019 1218  ? CREATININE 0.65 03/26/2022 0353  ? CREATININE 0.61 03/25/2022 1618  ? CREATININE 0.47 (L) 03/25/2022 0310  ? CREATININE 1.08 04/23/2015 1443  ? CREATININE 1.00 03/15/2015 1542  ? CALCIUM 9.1 03/26/2022 0353  ? CALCIUM 9.0 03/25/2022 1618  ? CALCIUM 8.6 (L) 03/25/2022 0310  ? GFRNONAA >60 03/26/2022 0353  ? GFRNONAA >60 03/25/2022 1618  ? GFRNONAA >60 03/25/2022 0310  ? GFRNONAA 83 03/15/2015 1542  ? GFRAA 118 03/07/2020 1446  ? GFRAA 115 08/02/2019 1419  ? GFRAA 123 07/13/2019 1218  ? GFRAA >89 03/15/2015 1542  ? ?CBC ?   ?Component Value Date/Time  ? WBC 19.0 (H) 03/26/2022 0353  ? RBC 4.37 03/26/2022 0353  ? HGB 12.4 (L) 03/26/2022 0353  ? HGB 15.5 11/30/2018 1204  ? HCT 37.7 (L) 03/26/2022 0353  ? HCT 45.3 11/30/2018 1204  ? PLT 215 03/26/2022 0353  ? PLT 229 11/30/2018 1204  ? MCV 86.3 03/26/2022 0353  ? MCV 88 11/30/2018 1204  ? MCH 28.4 03/26/2022 0353  ? MCHC 32.9 03/26/2022 0353  ? RDW 15.0 03/26/2022 0353  ? RDW 12.0 (L) 11/30/2018 1204  ? LYMPHSABS 3.0 06/12/2019 1603  ? MONOABS 0.7 06/12/2019 1603  ? EOSABS 0.3 06/12/2019 1603  ? BASOSABS 0.1 06/12/2019 1603  ? ?HEPATIC Function Panel ?Recent Labs  ?  03/20/22 ?0732  ?PROT 6.1*  ? ?HEMOGLOBIN A1C ?No components found for: HGA1C,  MPG ?CARDIAC ENZYMES ?Lab Results  ?Component Value Date  ? TROPONINI 0.04 (H) 03/05/2015  ? TROPONINI 0.05 (H) 03/05/2015  ? TROPONINI 0.05 (H) 03/05/2015  ? ?BNP ?No results for input(s): PROBNP in the last 8760 hours. ?TSH ?Recent Labs  ?  03/20/22 ?0732  ?TSH 2.492  ? ?CHOLESTEROL ?Recent Labs  ?  03/15/22 ?0408  ?CHOL 165  ? ? ?Scheduled Meds: ? acetaminophen  1,000 mg Oral Q6H  ? Or  ? acetaminophen (TYLENOL) oral liquid 160 mg/5 mL  1,000 mg Per Tube Q6H  ? aspirin EC  325 mg Oral Daily  ? Or  ? aspirin  324 mg Per Tube Daily  ? atorvastatin  80 mg Oral q1800  ? bisacodyl  10 mg Oral Daily  ? Or  ? bisacodyl  10 mg Rectal Daily  ? Chlorhexidine Gluconate Cloth  6 each Topical Daily  ?  Chlorhexidine Gluconate Cloth  6 each Topical Daily  ? docusate sodium  200 mg Oral Daily  ? enoxaparin (LOVENOX) injection  40 mg Subcutaneous Q24H  ? fluticasone furoate-vilanterol  1 puff Inhalation Daily  ? furosemide  40 mg Intravenous BID  ? insulin aspart  0-24 Units Subcutaneous Q4H  ? insulin detemir  12 Units Subcutaneous BID  ? levalbuterol  1.25 mg Nebulization BID  ? metoprolol tartrate  25 mg Oral QID  ? Or  ? metoprolol tartrate  25 mg Per Tube QID  ? montelukast  10 mg Per Tube QHS  ? pantoprazole  40 mg Oral Daily  ? sodium chloride flush  10-40 mL Intracatheter Q12H  ? sodium chloride flush  3 mL Intravenous Q12H  ? temazepam  30 mg Oral QHS  ? umeclidinium bromide  1 puff Inhalation Daily  ? ?Continuous Infusions: ? sodium chloride Stopped (03/25/22 0105)  ? sodium chloride    ? sodium chloride    ? amiodarone 30 mg/hr (03/26/22 1700)  ? lactated ringers    ? lactated ringers Stopped (03/26/22 0600)  ? milrinone 0.25 mcg/kg/min (03/26/22 1700)  ? nitroGLYCERIN 0 mcg/min (03/24/22 1430)  ? promethazine (PHENERGAN) injection (IM or IVPB) Stopped (03/26/22 1643)  ? ?PRN Meds:.sodium chloride, ALPRAZolam, dextrose, levalbuterol, melatonin, metoprolol tartrate, morphine injection, ondansetron (ZOFRAN) IV, oxyCODONE, promethazine (PHENERGAN) injection (IM or IVPB), sodium chloride flush, sodium chloride flush, traMADol ? ?Assessment/Plan: ? Acute coronary syndrome ?Multivessel CAD ?2 vessel CABG, post op day 2 ?HTN ?Morbid obesity ?Paroxysmal atrial fibrillation ? ?Plan: ?Increase metoprolol to 25 mg. 4 times daily for HR control. ? ? LOS: 12 days  ? ?Time spent including chart review, lab review, examination, discussion with patient/Nurse : 30 min ? ? ?Dixie Dials  MD  ?03/26/2022, 6:01 PM ? ? ? ? ?

## 2022-03-26 NOTE — Progress Notes (Signed)
CT surgery PM rounds ? ?Up in chair, ambulating with assistance-PT ?Remains in atrial fibrillation on IV amiodarone protocol, will add low-dose IV Cardizem ?Complains of nausea, Reglan started ?No bowel movement, dose of sorbitol ordered ?Pulmonary status stable ?Fluid balance 1.5 L negative today ? ?Blood pressure 112/75, pulse (!) 107, temperature 98.7 ?F (37.1 ?C), temperature source Oral, resp. rate 16, height 6\' 1"  (1.854 m), weight (!) 141.4 kg, SpO2 94 %.  ?

## 2022-03-27 ENCOUNTER — Inpatient Hospital Stay: Payer: Self-pay

## 2022-03-27 ENCOUNTER — Inpatient Hospital Stay (HOSPITAL_COMMUNITY): Payer: Medicare (Managed Care)

## 2022-03-27 LAB — GLUCOSE, CAPILLARY
Glucose-Capillary: 237 mg/dL — ABNORMAL HIGH (ref 70–99)
Glucose-Capillary: 123 mg/dL — ABNORMAL HIGH (ref 70–99)
Glucose-Capillary: 125 mg/dL — ABNORMAL HIGH (ref 70–99)
Glucose-Capillary: 149 mg/dL — ABNORMAL HIGH (ref 70–99)
Glucose-Capillary: 150 mg/dL — ABNORMAL HIGH (ref 70–99)

## 2022-03-27 LAB — COMPREHENSIVE METABOLIC PANEL
ALT: 18 U/L (ref 0–44)
AST: 20 U/L (ref 15–41)
Albumin: 2.9 g/dL — ABNORMAL LOW (ref 3.5–5.0)
Alkaline Phosphatase: 59 U/L (ref 38–126)
Anion gap: 9 (ref 5–15)
BUN: 10 mg/dL (ref 8–23)
CO2: 29 mmol/L (ref 22–32)
Calcium: 8.7 mg/dL — ABNORMAL LOW (ref 8.9–10.3)
Chloride: 93 mmol/L — ABNORMAL LOW (ref 98–111)
Creatinine, Ser: 0.68 mg/dL (ref 0.61–1.24)
GFR, Estimated: >60 mL/min (ref >60)
Glucose, Bld: 149 mg/dL — ABNORMAL HIGH (ref 70–99)
Potassium: 3.3 mmol/L — ABNORMAL LOW (ref 3.5–5.1)
Sodium: 131 mmol/L — ABNORMAL LOW (ref 135–145)
Total Bilirubin: 1 mg/dL (ref 0.3–1.2)
Total Protein: 6.1 g/dL — ABNORMAL LOW (ref 6.5–8.1)

## 2022-03-27 LAB — COOXEMETRY PANEL
Carboxyhemoglobin: 1 % (ref 0.5–1.5)
Methemoglobin: <0.7 % (ref 0.0–1.5)
O2 Saturation: 59.4 %
Total hemoglobin: 12.6 g/dL (ref 12.0–16.0)

## 2022-03-27 LAB — CBC
HCT: 37.2 % — ABNORMAL LOW (ref 39.0–52.0)
Hemoglobin: 12 g/dL — ABNORMAL LOW (ref 13.0–17.0)
MCH: 28.2 pg (ref 26.0–34.0)
MCHC: 32.3 g/dL (ref 30.0–36.0)
MCV: 87.3 fL (ref 80.0–100.0)
Platelets: 238 10*3/uL (ref 150–400)
RBC: 4.26 MIL/uL (ref 4.22–5.81)
RDW: 14.9 % (ref 11.5–15.5)
WBC: 16.4 10*3/uL — ABNORMAL HIGH (ref 4.0–10.5)
nRBC: 0 % (ref 0.0–0.2)

## 2022-03-27 LAB — TYPE AND SCREEN
ABO/RH(D): A POS
Antibody Screen: NEGATIVE
Unit division: 0
Unit division: 0

## 2022-03-27 LAB — BPAM RBC
Blood Product Expiration Date: 202304162359
Blood Product Expiration Date: 202305062359
ISSUE DATE / TIME: 202304090603
Unit Type and Rh: 6200
Unit Type and Rh: 6200

## 2022-03-27 MED ORDER — METOPROLOL TARTRATE 25 MG/10 ML ORAL SUSPENSION: 25.0000 mg | Freq: Two times a day (BID) | ORAL | Status: DC

## 2022-03-27 MED ORDER — AMIODARONE LOAD VIA INFUSION
150.0000 mg | Freq: Once | INTRAVENOUS | Status: AC
  Administered 2022-03-27: 150 mg via INTRAVENOUS
  Filled 2022-03-27: qty 83.34

## 2022-03-27 MED ORDER — POTASSIUM CHLORIDE 10 MEQ/50ML IV SOLN
10.0000 meq | Freq: Once | INTRAVENOUS | Status: AC
  Administered 2022-03-27: 10 meq via INTRAVENOUS
  Filled 2022-03-27: qty 50

## 2022-03-27 MED ORDER — APIXABAN 5 MG PO TABS
5.0000 mg | ORAL_TABLET | Freq: Two times a day (BID) | ORAL | Status: DC
Start: 2022-03-28 — End: 2022-04-02
  Administered 2022-03-28 – 2022-04-02 (×10): 5 mg via ORAL
  Filled 2022-03-27 (×10): qty 1

## 2022-03-27 MED ORDER — SODIUM CHLORIDE 0.9% FLUSH: 10.0000 mL | INTRAVENOUS | Status: DC | PRN

## 2022-03-27 MED ORDER — POTASSIUM CHLORIDE CRYS ER 20 MEQ PO TBCR
20.0000 meq | EXTENDED_RELEASE_TABLET | Freq: Two times a day (BID) | ORAL | Status: DC
Start: 2022-03-27 — End: 2022-04-02
  Administered 2022-03-27 – 2022-04-02 (×13): 20 meq via ORAL
  Filled 2022-03-27 (×14): qty 1

## 2022-03-27 MED ORDER — SODIUM CHLORIDE 0.9% FLUSH
10.0000 mL | Freq: Two times a day (BID) | INTRAVENOUS | Status: DC
  Administered 2022-03-27 – 2022-04-02 (×13): 10 mL

## 2022-03-27 MED ORDER — ASPIRIN EC 81 MG PO TBEC
81.0000 mg | DELAYED_RELEASE_TABLET | Freq: Every day | ORAL | Status: DC
  Administered 2022-03-28 – 2022-04-02 (×6): 81 mg via ORAL
  Filled 2022-03-27 (×6): qty 1

## 2022-03-27 MED ORDER — AMIODARONE IV BOLUS ONLY 150 MG/100ML
150.0000 mg | Freq: Once | INTRAVENOUS | Status: DC
  Filled 2022-03-27: qty 100

## 2022-03-27 MED ORDER — METOPROLOL TARTRATE 25 MG PO TABS
25.0000 mg | ORAL_TABLET | Freq: Two times a day (BID) | ORAL | Status: DC
  Administered 2022-03-27 – 2022-03-31 (×7): 25 mg via ORAL
  Filled 2022-03-27 (×7): qty 1

## 2022-03-27 NOTE — Progress Notes (Signed)
? ?   ?  [address] ?      [place] [address] ?            [phone]   ? ?  ?POD # 3 CABG ? ?Resting ?BP 106/84   Pulse 93   Temp 98.2 ?F (36.8 ?C) (Oral)   Resp (!) 21   Ht 6\' 1"  (1.854 m)   Wt (!) 142.3 kg   SpO2 90%   BMI 41.39 kg/m?  ?Milrinone 0.125, Diltiazem 5, amiodarone 30 ? ? ?Intake/Output Summary (Last 24 hours) at 03/27/2022 1705 ?Last data filed at 03/27/2022 1500 ?Gross per 24 hour  ?Intake 1118.78 ml  ?Output 2435 ml  ?Net -1316.22 ml  ? ?CBG elevated this AM, but better since ? ?03/29/2022 Salvatore Decent, MD ?[facility] ?[phone] ? ?

## 2022-03-27 NOTE — Progress Notes (Addendum)
TCTS DAILY ICU PROGRESS NOTE ? ?                 [address] ?           [place] [address] ?         [phone]  ? ?3 Days Post-Op ?Procedure(s) (LRB): ?CORONARY ARTERY BYPASS GRAFTING (CABG) TIMES TWO USING LEFT INTERNAL MAMMARY ARTERY AND ENDOSCOPICALLY HARVESTED RIGHT GREATER SAPHENOUS VEIN (N/A) ?TRANSESOPHAGEAL ECHOCARDIOGRAM (TEE) (N/A) ?ENDOVEIN HARVEST OF GREATER SAPHENOUS VEIN (Right) ? ?Total Length of Stay:  LOS: 13 days  ? ?Subjective: ?Patient denies nausea, which he had the previous 2 days. He had a bowel movement and is feeling better this am. ? ?Objective: ?Vital signs in last 24 hours: ?Temp:  [98.3 ?F (36.8 ?C)-99 ?F (37.2 ?C)] 98.3 ?F (36.8 ?C) (04/14 0730) ?Pulse Rate:  [81-120] 107 (04/14 0820) ?Cardiac Rhythm: Atrial fibrillation (04/14 0400) ?Resp:  [13-31] 21 (04/14 0820) ?BP: (86-145)/(59-129) 109/67 (04/14 0730) ?SpO2:  [89 %-97 %] 94 % (04/14 0820) ?Weight:  [142.3 kg] 142.3 kg (04/14 0500) ? ?Filed Weights  ? 03/25/22 0500 03/26/22 0407 03/27/22 0500  ?Weight: (!) 144.4 kg (!) 141.4 kg (!) 142.3 kg  ? ? ?Weight change: 0.9 kg  ? ?  ? ?Intake/Output from previous day: ?04/13 0701 - 04/14 0700 ?In: 1634.8 [P.O.:720; I.V.:714.9; IV Piggyback:199.9] ?Out: 3285 [Urine:3245; Chest Tube:40] ? ?Intake/Output this shift: ?No intake/output data recorded. ? ?Current Meds: ?Scheduled Meds: ? acetaminophen  1,000 mg Oral Q6H  ? Or  ? acetaminophen (TYLENOL) oral liquid 160 mg/5 mL  1,000 mg Per Tube Q6H  ? amiodarone  150 mg Intravenous Once  ? aspirin EC  325 mg Oral Daily  ? Or  ? aspirin  324 mg Per Tube Daily  ? atorvastatin  80 mg Oral q1800  ? bisacodyl  10 mg Oral Daily  ? Or  ? bisacodyl  10 mg Rectal Daily  ? Chlorhexidine Gluconate Cloth  6 each Topical Daily  ? Chlorhexidine Gluconate Cloth  6 each Topical Daily  ? digoxin  0.25 mg Oral Daily  ? docusate sodium  200 mg Oral Daily  ? enoxaparin (LOVENOX) injection  40 mg Subcutaneous Q24H  ? fluticasone  furoate-vilanterol  1 puff Inhalation Daily  ? furosemide  40 mg Intravenous BID  ? insulin aspart  0-24 Units Subcutaneous TID AC & HS  ? insulin detemir  12 Units Subcutaneous BID  ? metoCLOPramide (REGLAN) injection  10 mg Intravenous Q6H  ? metoprolol tartrate  25 mg Oral BID  ? Or  ? metoprolol tartrate  25 mg Per Tube BID  ? montelukast  10 mg Per Tube QHS  ? pantoprazole  40 mg Oral Daily  ? potassium chloride  20 mEq Oral BID  ? sodium chloride flush  10-40 mL Intracatheter Q12H  ? sodium chloride flush  3 mL Intravenous Q12H  ? umeclidinium bromide  1 puff Inhalation Daily  ? ?Continuous Infusions: ? sodium chloride Stopped (03/25/22 0105)  ? sodium chloride    ? sodium chloride    ? amiodarone 30 mg/hr (03/27/22 0600)  ? diltiazem (CARDIZEM) infusion 5 mg/hr (03/27/22 0600)  ? lactated ringers    ? lactated ringers Stopped (03/26/22 0600)  ? milrinone 0.125 mcg/kg/min (03/27/22 0600)  ? potassium chloride    ? ?PRN Meds:.sodium chloride, ALPRAZolam, dextrose, hydrOXYzine, metoprolol tartrate, morphine injection, ondansetron (ZOFRAN) IV, oxyCODONE, sodium chloride flush, sodium chloride flush, temazepam, traMADol ? ?General appearance: alert, cooperative,  and no distress ?Neurologic: intact ?Heart: IRRR IRRR ?Lungs: Slightly diminished bibasilar breath sounds ?Abdomen: Soft, obese, non tender ?Extremities: Mild LE edema ?Wound: Aquacel intact . RLE wound is clean and dry ? ?Lab Results: ?CBC: ?Recent Labs  ?  03/26/22 ?0353 03/27/22 ?0418  ?WBC 19.0* 16.4*  ?HGB 12.4* 12.0*  ?HCT 37.7* 37.2*  ?PLT 215 238  ? ?BMET:  ?Recent Labs  ?  03/26/22 ?0353 03/27/22 ?0418  ?NA 132* 131*  ?K 4.3 3.3*  ?CL 96* 93*  ?CO2 30 29  ?GLUCOSE 149* 149*  ?BUN 9 10  ?CREATININE 0.65 0.68  ?CALCIUM 9.1 8.7*  ?  ?CMET: ?Lab Results  ?Component Value Date  ? WBC 16.4 (H) 03/27/2022  ? HGB 12.0 (L) 03/27/2022  ? HCT 37.2 (L) 03/27/2022  ? PLT 238 03/27/2022  ? GLUCOSE 149 (H) 03/27/2022  ? CHOL 165 03/15/2022  ? TRIG 91 03/15/2022   ? HDL 36 (L) 03/15/2022  ? LDLCALC 111 (H) 03/15/2022  ? ALT 18 03/27/2022  ? AST 20 03/27/2022  ? NA 131 (L) 03/27/2022  ? K 3.3 (L) 03/27/2022  ? CL 93 (L) 03/27/2022  ? CREATININE 0.68 03/27/2022  ? BUN 10 03/27/2022  ? CO2 29 03/27/2022  ? TSH 2.492 03/20/2022  ? INR 1.3 (H) 03/24/2022  ? HGBA1C 5.9 (H) 03/23/2022  ? ? ? ? ?PT/INR:  ?Recent Labs  ?  03/24/22 ?1439  ?LABPROT 15.9*  ?INR 1.3*  ? ?Radiology: [facility] ? ?Result Date: 03/27/2022 ?CLINICAL DATA:  History of CABG 03/24/2022 EXAM: PORTABLE CHEST 1 VIEW COMPARISON:  Chest radiograph 1 day prior FINDINGS: The right IJ vascular sheath is stable. The chest tubes have been removed. Median sternotomy wires are stable. The cardiac silhouette is enlarged with a prominent upper mediastinum, unchanged. Is no new or worsening focal airspace disease. There is no enlarging pleural effusion. There is no appreciable pneumothorax. There is no acute osseous abnormality. IMPRESSION: Interval removal of the bilateral chest tubes. No pneumothorax. No significant interval change in lung aeration. Electronically Signed   By: Valetta Mole M.D.   On: 03/27/2022 08:12  ? ?[place] EKG SITE RITE ? ?Result Date: 03/27/2022 ?If [employer] not attached, placement could not be confirmed due to current cardiac rhythm.  ? ? ?Assessment/Plan: ?S/P Procedure(s) (LRB): ?CORONARY ARTERY BYPASS GRAFTING (CABG) TIMES TWO USING LEFT INTERNAL MAMMARY ARTERY AND ENDOSCOPICALLY HARVESTED RIGHT GREATER SAPHENOUS VEIN (N/A) ?TRANSESOPHAGEAL ECHOCARDIOGRAM (TEE) (N/A) ?ENDOVEIN HARVEST OF GREATER SAPHENOUS VEIN (Right) ?CV-post op a fib with HR in the 110's this am. Co ox this am 59.4. On Amiodarone at 30, Diltiazem 5, and  Milrinone drips. Also, on Lopressor 25 mg bid, Digoxin 0.125 mg daily ?Pulmonary-on 2 liters of oxygen via [place]. CXR this am shows no pneumothorax. Encourage incentive spirometer. ?Volume overload ?Expected post op blood loss anemia-H and H this am stable at 12 and  37.2 ?5. History of pre diabetes-CBGs 156/192/237. On Insulin. Will stop accu checks and SS PRN later in post op course. ?6. Supplement potassium ?7. Per Dr. Prescott Gum, remove EPW ?8. On Lovenox for DVT prophylaxis ? ?Donielle Liston Alba PA-C ?03/27/2022 8:24 AM  ? ?Cont iv amio and iv low dose cardizem until convert to nsr ?EPWs arwe out- start Eliquis tomorrow ?Cont bid iv lasix , wean off milrinone ? ?patient examined and medical record reviewed,agree with above note. ?Dahlia Byes ?03/27/2022 ? ?

## 2022-03-27 NOTE — Progress Notes (Signed)
Peripherally Inserted Central Catheter Placement ? ?The IV Nurse has discussed with the patient and/or persons authorized to consent for the patient, the purpose of this procedure and the potential benefits and risks involved with this procedure.  The benefits include less needle sticks, lab draws from the catheter, and the patient may be discharged home with the catheter. Risks include, but not limited to, infection, bleeding, blood clot (thrombus formation), and puncture of an artery; nerve damage and irregular heartbeat and possibility to perform a PICC exchange if needed/ordered by physician.  Alternatives to this procedure were also discussed.  Bard Power PICC patient education guide, fact sheet on infection prevention and patient information card has been provided to patient /or left at bedside.   ? ?PICC Placement Documentation  ?PICC Double Lumen [id number] Right Basilic 49 cm 0 cm (Active)  ?Indication for Insertion or Continuance of Line Vasoactive infusions 03/27/22 1227  ?Exposed Catheter (cm) 0 cm 03/27/22 1227  ?Site Assessment Clean, Dry, Intact 03/27/22 1227  ?Lumen #1 Status Flushed;Saline locked;Blood return noted 03/27/22 1227  ?Lumen #2 Status Flushed;Saline locked;Blood return noted 03/27/22 1227  ?Dressing Type Securing device;Transparent 03/27/22 1227  ?Dressing Status Antimicrobial disc in place 03/27/22 1227  ?Dressing Intervention New dressing;Other (Comment) 03/27/22 1227  ?Dressing Change Due 04/03/22 03/27/22 1227  ? ? ? ? ? ?Christella Noa Albarece ?03/27/2022, 12:28 PM ? ?

## 2022-03-27 NOTE — Discharge Summary (Signed)
? ?hysician Discharge Summary  ? ? ?   ?[address] ?      [place] [address] ?            [phone]   ? ?Patient ID: ?Penny Pia ?MRN: [id number] ?DOB/AGE: 1956-08-26 66 y.o. ? ?Admit date: 03/14/2022 ?Discharge date: 04/02/2022 ? ?Admission Diagnoses: ?Acute coronary syndrome ([place]) ?2. Coronary artery disease ? ?Discharge Diagnoses:  ?S/P CABG x 2 ?2. History of persistent atrial fibrillation ?3. History of the following: ?   ? Chronic systolic CHF (congestive heart failure) ([place])    ?  a. 2D ECHO (03/06/15) EF 20-25%, diffuse HK. Asc aortic diameter: 34mm. Mild LA dilation, mild RV dilation. Mild RV systolic dysfunction   b. LifeVest placed on 02/2015 admissoin   ? Coronary artery disease    ?  a. 70% LAD lesion  ? Elevated TSH    ? ETOH abuse    ? Hypertension    ? Morbid obesity ([place])    ?  a. BMI ~46  ? ?Pre-diabetes     ?  a. HgA1c 6.1  ? ? ?Consults: cardiology ? ?Procedure (s):  ?1.  Coronary artery bypass grafting x2 (left internal mammary artery to left anterior descending, saphenous vein graft to right coronary artery posterolateral). ?2.  Endoscopic harvest of right leg greater saphenous vein by Dr. Prescott Gum on 03/24/2022. ? ?Referring: Dr. Angelena Form and Dr. Doylene Canard MD ?Primary Care: Andria Frames, PA-C ?Primary Cardiologist:Kenneth Wells Guiles, MD ? ?History of Present Illness:     ?This is a 66 year old male with a past medical history of chronic systolic CHF, ETOH and tobacco abuse, hypertension, morbid obesity, persistent a fib, COPD/emphysema, and pre diabetes who has had intermittent chest pain with radiation to both shoulders for the last several weeks. EMS was summonsed and he was given SL NItro and asa. He presented to [facility] ED for further evaluation and treatment. He denies LE edema, syncope, shortness of breath, diaphoresis, or nausea/vomiting. Max Troponin I (high sensitivity) was 164. Echo done 03/14/2022 showed LVEF 55-60%, the left ventricle has no regional wall  motion abnormalities, mild to moderate MR, mild calcification of the aortic valve, mild TR, and no pericardial effusion. Cardiac catheterization done 03/18/2022 showed proximal to mid LAD with an 85% stenosis and a proximal RCA lesion that is 90% stenosed. Dr. Angelena Form attempted a PCI of the RCA but this was unsuccessful as RCA not able to be engaged despite using different catheters.  ?Patient and his wife (widowed 3 years ago) moved here several years ago from [place] to help their daughter with grandsons. Patient continues to help take care of his 2 grandsons (now ages 70 and 71) . Even though he uses oxygen at night, he states he is ambulatory and able to do his ADLs. ?Dr. Prescott Gum has been consulted for the consideration of coronary artery bypass grafting surgery. At the time of my exam, patient denies chest pain or shortness of breath. ?Dr. Darcey Nora recommended coronary bypass grafting and Mr. Beevers elected to proceed.  He remained stable following left heart catheterization.  Preoperative work-up was satisfactory.  Vein mapping showed adequate conduit in the right lower extremity. ? ?Mr. Foucher was taken to the operating room on 03/24/2022 where CABG x2 was accomplished.  Following the procedure, he separated from cardiopulmonary bypass on milrinone and norepinephrine. ? ?Postoperative Hospital Course: ? ?Mr. Simmering was transferred to the surgical ICU in stable condition.  Vital signs and hemodynamics  remained stable.  He was weaned from the ventilator and extubated by 8:30 PM on the day of surgery.He was on Milrinone drip post op. He developed some atrial fibrillation and was started on amiodarone infusion.  He converted to stable sinus rhythm.  Diuresis was begun on the first postoperative day for expected volume excess.  The amiodarone was converted to oral form on postop day 1.  He was mobilized with the assistance of physical therapy.  Home PT was also recommended by the physical therapy team along with  rolling walker and bedside commode.  This equipment was arranged.  On postop day 2, he was transitioned back to his Tikosyn that he was taking prior to admission.  Tikosyn was then stopped and Diltiazem drip was started. He was also on Digoxin and Lopressor. The chest tubes and monitoring lines were removed. EPW were removed on 04/14. Milrinone drip was stopped on 04/15. He had nausea post op and Zofran and Reglan alleviated this.  He was restarted on his home Eliquis.  He underwent ultrasound to assess for pleural effusion.  There was not felt to be a sufficient amount of fluid present for drainage.  He was felt surgically stable for transfer from the ICU to 4E on 04/18.  He remains in rate controlled Atrial Fibrillation.  He continues to diurese well on lasix.  He is ambulating without difficulty.  His surgical incisions are healing without evidence of infection.  He is medically stable for discharge home today.  ? ? ?Latest Vital Signs: ?Blood pressure (!) 121/58, pulse 69, temperature 97.7 ?F (36.5 ?C), temperature source Oral, resp. rate 19, height 6\' 1"  (1.854 m), weight (!) 142.1 kg, SpO2 96 %. ? ?Physical Exam: ? ?General appearance: alert, cooperative, and no distress ?Heart: regular rate and rhythm ?Lungs: clear to auscultation bilaterally ?Abdomen: soft, non-tender; bowel sounds normal; no masses,  no organomegaly ?Extremities: edema trace to 1+ improving ?Wound: clean and dry ? ?Discharge Condition: Stable and discharged to home. ? ?Recent laboratory studies:  ?Lab Results  ?Component Value Date  ? WBC 12.6 (H) 04/02/2022  ? HGB 11.3 (L) 04/02/2022  ? HCT 33.7 (L) 04/02/2022  ? MCV 85.5 04/02/2022  ? PLT 393 04/02/2022  ? ?Lab Results  ?Component Value Date  ? NA 132 (L) 04/02/2022  ? K 4.0 04/02/2022  ? CL 94 (L) 04/02/2022  ? CO2 31 04/02/2022  ? CREATININE 0.76 04/02/2022  ? GLUCOSE 126 (H) 04/02/2022  ? ? ?  ?Diagnostic Studies: DG Chest 2 View ? ?Result Date: 03/31/2022 ?CLINICAL DATA:  Coronary  artery bypass grafting.  [id number]. EXAM: CHEST - 2 VIEW COMPARISON:  6:12 a.m. FINDINGS: Mild elevation of the left hemidiaphragm and small left pleural effusion are unchanged. The lungs are well expanded and pulmonary insufflation is stable since prior examination. No confluent pulmonary infiltrate. No pneumothorax. Right upper extremity PICC line tip is seen within the superior vena cava. Coronary artery bypass grafting has been performed. Cardiac size is enlarged, unchanged. Pulmonary vascularity is normal. No acute bone abnormality IMPRESSION: Stable small left pleural effusion and mild elevation of the left hemidiaphragm. Stable cardiomegaly Preserved pulmonary insufflation Electronically Signed   By: Fidela Salisbury M.D.   On: 03/31/2022 21:24  ? ?DG Chest 2 View ? ?Result Date: 03/31/2022 ?CLINICAL DATA:  Status post CABG EXAM: CHEST - 2 VIEW COMPARISON:  03/29/2022 FINDINGS: Lateral view degraded by patient arm position. Median sternotomy for CABG. Right-sided PICC line tip is difficult to visualize, likely in the  low SVC. Midline trachea. Mild cardiomegaly. Atherosclerosis in the transverse aorta. Trace left pleural fluid or thickening. No pneumothorax. No congestive failure. Moderate left hemidiaphragm elevation with bibasilar atelectasis. IMPRESSION: Cardiomegaly without congestive failure. Trace left pleural fluid or thickening. Aortic Atherosclerosis (ICD10-I70.0). Electronically Signed   By: Abigail Miyamoto M.D.   On: 03/31/2022 08:12  ? ?DG Chest 2 View ? ?Result Date: 03/23/2022 ?CLINICAL DATA:  Preop evaluation.  History of CHF and hypertension EXAM: CHEST - 2 VIEW COMPARISON:  03/14/2022, 03/20/2022 FINDINGS: Similar hyperinflation and bibasilar scarring versus atelectasis. No superimposed acute airspace process, pneumonia, edema, enlarging effusion, or pneumothorax. Trachea midline. Degenerative changes noted throughout the spine. IMPRESSION: Stable hyperinflation and basilar atelectasis versus scarring.  No acute process or interval change by plain radiography. Electronically Signed   By: Jerilynn Mages.  Shick M.D.   On: 03/23/2022 09:49  ? ?DG Chest 2 View ? ?Result Date: 03/14/2022 ?CLINICAL DATA:  Chest pain EXAM: CHEST

## 2022-03-27 NOTE — Progress Notes (Signed)
Epicardial pacing wires removed 1753 per provider orders. VS per protocol. Pacing wired removed without difficulty, wires intact. Pt. tolerated well. ?

## 2022-03-27 NOTE — Progress Notes (Signed)
Physical Therapy Treatment ?Patient Details ?Name: Arthur Church ?MRN: [id number] ?DOB: January 18, 1956 ?Today's Date: 03/27/2022 ? ? ?History of Present Illness 66 y.o. M who presents with chest pain. Admitted with acute coronary syndrome and NSTEMI  now s/p CABG x 2 4/11. Significant PMH: obesity, PAF, COPD, DM2. ? ?  ?PT Comments  ? ? Pt is making great progress with mobility, demonstrating improved balance and activity tolerance through ambulating up to ~390 ft at a min guard assist level without LOB today. He ambulated initially with the RW but was able to progress to no UE support without LOB. However, he did display some mild instability without UE support and would still benefit from a RW at this time. Pt's SpO2 is declining on RA, still needing 3L of supplemental O2 to maintain VSS. Will continue to follow acutely. Current recommendations remain appropriate. ? ?   ?Recommendations for follow up therapy are one component of a multi-disciplinary discharge planning process, led by the attending physician.  Recommendations may be updated based on patient status, additional functional criteria and insurance authorization. ? ?Follow Up Recommendations ? Home health PT ?  ?  ?Assistance Recommended at Discharge Frequent or constant Supervision/Assistance  ?Patient can return home with the following A little help with walking and/or transfers;A little help with bathing/dressing/bathroom;Assistance with cooking/housework;Assist for transportation;Help with stairs or ramp for entrance ?  ?Equipment Recommendations ? Rolling walker (2 wheels) (bariatric equipment)  ?  ?Recommendations for Other Services   ? ? ?  ?Precautions / Restrictions Precautions ?Precautions: Sternal;Fall;Other (comment) ?Precaution Booklet Issued: No ?Precaution Comments: watch SpO2 ?Restrictions ?Weight Bearing Restrictions: Yes ?Other Position/Activity Restrictions: sternal precautions  ?  ? ?Mobility ? Bed Mobility ?  ?  ?  ?  ?  ?  ?  ?General bed  mobility comments: Pt up in chair upon arrival. ?  ? ?Transfers ?Overall transfer level: Needs assistance ?Equipment used: Rolling walker (2 wheels) ?Transfers: Sit to/from Stand ?Sit to Stand: Min guard ?  ?  ?  ?  ?  ?General transfer comment: Min guard assist for safety, good compliance with hugging pillow with transfers. ?  ? ?Ambulation/Gait ?Ambulation/Gait assistance: Min guard ?Gait Distance (Feet): 390 Feet ?Assistive device: Rolling walker (2 wheels), None ?Gait Pattern/deviations: Step-through pattern, Decreased stride length ?Gait velocity: WFL ?Gait velocity interpretation: >4.37 ft/sec, indicative of normal walking speed ?  ?General Gait Details: Pt with good, upright posture and pace. Pt ambulated initial ~350 ft with a RW but progressed to no UE support the final ~40 ft with no LOB but mild instability noted, min guard for safety ? ? ?Stairs ?  ?  ?  ?  ?  ? ? ?Wheelchair Mobility ?  ? ?Modified Rankin (Stroke Patients Only) ?  ? ? ?  ?Balance Overall balance assessment: Needs assistance ?Sitting-balance support: No upper extremity supported, Feet supported ?Sitting balance-Leahy Scale: Good ?  ?  ?Standing balance support: Bilateral upper extremity supported, No upper extremity supported, During functional activity ?Standing balance-Leahy Scale: Fair ?Standing balance comment: Able to ambulate without UE support, assistance, or LOB but displays mild instability, still benefiting from RW ?  ?  ?  ?  ?  ?  ?  ?  ?  ?  ?  ?  ? ?  ?Cognition Arousal/Alertness: Awake/alert ?Behavior During Therapy: [facility] for tasks assessed/performed ?Overall Cognitive Status: Within Functional Limits for tasks assessed ?  ?  ?  ?  ?  ?  ?  ?  ?  ?  ?  ?  ?  ?  ?  ?  ?  ?  ?  ? ?  ?  Exercises   ? ?  ?General Comments General comments (skin integrity, edema, etc.): SpO2 down to low 80s% on RA, needing 3L to maintain VSS ?  ?  ? ?Pertinent Vitals/Pain Pain Assessment ?Pain Assessment: Faces ?Faces Pain Scale: Hurts a little  bit ?Pain Location: sternum ?Pain Descriptors / Indicators: Discomfort, Operative site guarding ?Pain Intervention(s): Limited activity within patient's tolerance, Monitored during session, Repositioned  ? ? ?Home Living   ?  ?  ?  ?  ?  ?  ?  ?  ?  ?   ?  ?Prior Function    ?  ?  ?   ? ?PT Goals (current goals can now be found in the care plan section) Acute Rehab PT Goals ?Patient Stated Goal: to walk ?PT Goal Formulation: With patient/family ?Time For Goal Achievement: 04/08/22 ?Potential to Achieve Goals: Good ?Progress towards PT goals: Progressing toward goals ? ?  ?Frequency ? ? ? Min 3X/week ? ? ? ?  ?PT Plan Equipment recommendations need to be updated  ? ? ?Co-evaluation   ?  ?  ?  ?  ? ?  ?AM-PAC PT "6 Clicks" Mobility   ?Outcome Measure ? Help needed turning from your back to your side while in a flat bed without using bedrails?: A Little ?Help needed moving from lying on your back to sitting on the side of a flat bed without using bedrails?: A Little ?Help needed moving to and from a bed to a chair (including a wheelchair)?: A Little ?Help needed standing up from a chair using your arms (e.g., wheelchair or bedside chair)?: A Little ?Help needed to walk in hospital room?: A Little ?Help needed climbing 3-5 steps with a railing? : A Little ?6 Click Score: 18 ? ?  ?End of Session Equipment Utilized During Treatment: Oxygen ?Activity Tolerance: Patient tolerated treatment well ?Patient left: in chair;with call bell/phone within reach;with family/visitor present ?Nurse Communication: Mobility status ?PT Visit Diagnosis: Unsteadiness on feet (R26.81);Difficulty in walking, not elsewhere classified (R26.2);Pain;Other abnormalities of gait and mobility (R26.89) ?Pain - part of body:  (sternum) ?  ? ? ?Time: [id number] ?PT Time Calculation (min) (ACUTE ONLY): 20 min ? ?Charges:  $Gait Training: 8-22 mins          ?          ? ?Moishe Spice, PT, DPT ?Acute Rehabilitation Services  ?Pager: [phone] ?Office:  [phone] ? ? ? ?Maretta Bees Pettis ?03/27/2022, 3:24 PM ? ?

## 2022-03-27 NOTE — Progress Notes (Signed)
Ref: Virgilio Belling, PA-C ? ? ?Subjective:  ?Awake. HR improved to 100-110/min post another bolus of amiodarone. ? ?Objective:  ?Vital Signs in the last 24 hours: ?Temp:  [98.3 ?F (36.8 ?C)-99 ?F (37.2 ?C)] 98.3 ?F (36.8 ?C) (04/14 0730) ?Pulse Rate:  [81-120] 107 (04/14 0820) ?Cardiac Rhythm: Atrial fibrillation (04/14 0400) ?Resp:  [13-31] 21 (04/14 0820) ?BP: (86-145)/(59-106) 109/67 (04/14 0730) ?SpO2:  [89 %-97 %] 94 % (04/14 0820) ?Weight:  [142.3 kg] 142.3 kg (04/14 0500) ? ?Physical Exam: ?BP Readings from Last 1 Encounters:  ?03/27/22 109/67  ?   ?Wt Readings from Last 1 Encounters:  ?03/27/22 (!) 142.3 kg  ?  Weight change: 0.9 kg Body mass index is 41.39 kg/m?. ?HEENT: [place]/AT, Eyes-Blue, Conjunctiva-Pink, Sclera-Non-icteric ?Neck: No JVD, No bruit, Trachea midline. ?Lungs:  Clear, Bilateral. ?Cardiac:  Irregular rhythm, normal S1 and S2, no S3. II/VI systolic murmur. ?Abdomen:  Soft, non-tender. BS present. ?Extremities:  No edema present. No cyanosis. No clubbing. ?CNS: AxOx3, Cranial nerves grossly intact, moves all 4 extremities.  ?Skin: Warm and dry. ? ? ?Intake/Output from previous day: ?04/13 0701 - 04/14 0700 ?In: 1661.6 [P.O.:720; I.V.:741.8; IV Piggyback:199.9] ?Out: 3285 [Urine:3245; Chest Tube:40] ? ? ? ?Lab Results: ?BMET ?   ?Component Value Date/Time  ? NA 131 (L) 03/27/2022 0418  ? NA 132 (L) 03/26/2022 0353  ? NA 133 (L) 03/25/2022 1618  ? NA 140 03/07/2020 1446  ? NA 140 08/02/2019 1419  ? NA 140 07/13/2019 1218  ? K 3.3 (L) 03/27/2022 0418  ? K 4.3 03/26/2022 0353  ? K 4.5 03/25/2022 1618  ? CL 93 (L) 03/27/2022 0418  ? CL 96 (L) 03/26/2022 0353  ? CL 99 03/25/2022 1618  ? CO2 29 03/27/2022 0418  ? CO2 30 03/26/2022 0353  ? CO2 26 03/25/2022 1618  ? GLUCOSE 149 (H) 03/27/2022 0418  ? GLUCOSE 149 (H) 03/26/2022 0353  ? GLUCOSE 172 (H) 03/25/2022 1618  ? BUN 10 03/27/2022 0418  ? BUN 9 03/26/2022 0353  ? BUN 7 (L) 03/25/2022 1618  ? BUN 19 03/07/2020 1446  ? BUN 15 08/02/2019 1419  ?  BUN 14 07/13/2019 1218  ? CREATININE 0.68 03/27/2022 0418  ? CREATININE 0.65 03/26/2022 0353  ? CREATININE 0.61 03/25/2022 1618  ? CREATININE 1.08 04/23/2015 1443  ? CREATININE 1.00 03/15/2015 1542  ? CALCIUM 8.7 (L) 03/27/2022 0418  ? CALCIUM 9.1 03/26/2022 0353  ? CALCIUM 9.0 03/25/2022 1618  ? GFRNONAA >60 03/27/2022 0418  ? GFRNONAA >60 03/26/2022 0353  ? GFRNONAA >60 03/25/2022 1618  ? GFRNONAA 83 03/15/2015 1542  ? GFRAA 118 03/07/2020 1446  ? GFRAA 115 08/02/2019 1419  ? GFRAA 123 07/13/2019 1218  ? GFRAA >89 03/15/2015 1542  ? ?CBC ?   ?Component Value Date/Time  ? WBC 16.4 (H) 03/27/2022 0418  ? RBC 4.26 03/27/2022 0418  ? HGB 12.0 (L) 03/27/2022 0418  ? HGB 15.5 11/30/2018 1204  ? HCT 37.2 (L) 03/27/2022 0418  ? HCT 45.3 11/30/2018 1204  ? PLT 238 03/27/2022 0418  ? PLT 229 11/30/2018 1204  ? MCV 87.3 03/27/2022 0418  ? MCV 88 11/30/2018 1204  ? MCH 28.2 03/27/2022 0418  ? MCHC 32.3 03/27/2022 0418  ? RDW 14.9 03/27/2022 0418  ? RDW 12.0 (L) 11/30/2018 1204  ? LYMPHSABS 3.0 06/12/2019 1603  ? MONOABS 0.7 06/12/2019 1603  ? EOSABS 0.3 06/12/2019 1603  ? BASOSABS 0.1 06/12/2019 1603  ? ?HEPATIC Function Panel ?Recent Labs  ?  03/20/22 ?0732 03/27/22 ?1497  ?PROT 6.1* 6.1*  ? ?HEMOGLOBIN A1C ?No components found for: HGA1C,  MPG ?CARDIAC ENZYMES ?Lab Results  ?Component Value Date  ? TROPONINI 0.04 (H) 03/05/2015  ? TROPONINI 0.05 (H) 03/05/2015  ? TROPONINI 0.05 (H) 03/05/2015  ? ?BNP ?No results for input(s): PROBNP in the last 8760 hours. ?TSH ?Recent Labs  ?  03/20/22 ?0732  ?TSH 2.492  ? ?CHOLESTEROL ?Recent Labs  ?  03/15/22 ?0408  ?CHOL 165  ? ? ?Scheduled Meds: ? acetaminophen  1,000 mg Oral Q6H  ? Or  ? acetaminophen (TYLENOL) oral liquid 160 mg/5 mL  1,000 mg Per Tube Q6H  ? amiodarone  150 mg Intravenous Once  ? aspirin EC  325 mg Oral Daily  ? Or  ? aspirin  324 mg Per Tube Daily  ? atorvastatin  80 mg Oral q1800  ? bisacodyl  10 mg Oral Daily  ? Or  ? bisacodyl  10 mg Rectal Daily  ?  Chlorhexidine Gluconate Cloth  6 each Topical Daily  ? Chlorhexidine Gluconate Cloth  6 each Topical Daily  ? digoxin  0.25 mg Oral Daily  ? docusate sodium  200 mg Oral Daily  ? enoxaparin (LOVENOX) injection  40 mg Subcutaneous Q24H  ? fluticasone furoate-vilanterol  1 puff Inhalation Daily  ? furosemide  40 mg Intravenous BID  ? insulin aspart  0-24 Units Subcutaneous TID AC & HS  ? insulin detemir  12 Units Subcutaneous BID  ? metoCLOPramide (REGLAN) injection  10 mg Intravenous Q6H  ? metoprolol tartrate  25 mg Oral BID  ? Or  ? metoprolol tartrate  25 mg Per Tube BID  ? montelukast  10 mg Per Tube QHS  ? pantoprazole  40 mg Oral Daily  ? potassium chloride  20 mEq Oral BID  ? sodium chloride flush  10-40 mL Intracatheter Q12H  ? sodium chloride flush  3 mL Intravenous Q12H  ? umeclidinium bromide  1 puff Inhalation Daily  ? ?Continuous Infusions: ? sodium chloride Stopped (03/25/22 0105)  ? sodium chloride    ? sodium chloride    ? amiodarone 30 mg/hr (03/27/22 0826)  ? diltiazem (CARDIZEM) infusion 5 mg/hr (03/27/22 0800)  ? lactated ringers    ? lactated ringers Stopped (03/26/22 0600)  ? milrinone 0.125 mcg/kg/min (03/27/22 0800)  ? ?PRN Meds:.sodium chloride, ALPRAZolam, dextrose, hydrOXYzine, metoprolol tartrate, morphine injection, ondansetron (ZOFRAN) IV, oxyCODONE, sodium chloride flush, sodium chloride flush, temazepam, traMADol ? ?Assessment/Plan: ? Unstable angina ?Multivessel CAD ?S/P 2 V CABG, Post op day 3 ?HTN ?Morbid obesity ?Atrial fibrillation ? ?Plan: ?Continue Amiodarone, diltiazem and metoprolol for rate control. ?Convert to oral regimen tomorrow if stable. ? ? LOS: 13 days  ? ?Time spent including chart review, lab review, examination, discussion with patient/Nurse : 30 min ? ? ?[name]  MD  ?03/27/2022, 10:20 AM ? ? ? ? ?

## 2022-03-28 LAB — GLUCOSE, CAPILLARY
Glucose-Capillary: 122 mg/dL — ABNORMAL HIGH (ref 70–99)
Glucose-Capillary: 136 mg/dL — ABNORMAL HIGH (ref 70–99)
Glucose-Capillary: 131 mg/dL — ABNORMAL HIGH (ref 70–99)
Glucose-Capillary: 153 mg/dL — ABNORMAL HIGH (ref 70–99)

## 2022-03-28 LAB — COMPREHENSIVE METABOLIC PANEL
ALT: 16 U/L (ref 0–44)
AST: 18 U/L (ref 15–41)
Albumin: 2.6 g/dL — ABNORMAL LOW (ref 3.5–5.0)
Alkaline Phosphatase: 52 U/L (ref 38–126)
Anion gap: 7 (ref 5–15)
BUN: 12 mg/dL (ref 8–23)
CO2: 30 mmol/L (ref 22–32)
Calcium: 8.6 mg/dL — ABNORMAL LOW (ref 8.9–10.3)
Chloride: 93 mmol/L — ABNORMAL LOW (ref 98–111)
Creatinine, Ser: 0.71 mg/dL (ref 0.61–1.24)
GFR, Estimated: >60 mL/min (ref >60)
Glucose, Bld: 175 mg/dL — ABNORMAL HIGH (ref 70–99)
Potassium: 3.7 mmol/L (ref 3.5–5.1)
Sodium: 130 mmol/L — ABNORMAL LOW (ref 135–145)
Total Bilirubin: 0.9 mg/dL (ref 0.3–1.2)
Total Protein: 5.7 g/dL — ABNORMAL LOW (ref 6.5–8.1)

## 2022-03-28 LAB — CBC
HCT: 35 % — ABNORMAL LOW (ref 39.0–52.0)
Hemoglobin: 11.7 g/dL — ABNORMAL LOW (ref 13.0–17.0)
MCH: 28.8 pg (ref 26.0–34.0)
MCHC: 33.4 g/dL (ref 30.0–36.0)
MCV: 86.2 fL (ref 80.0–100.0)
Platelets: 238 10*3/uL (ref 150–400)
RBC: 4.06 MIL/uL — ABNORMAL LOW (ref 4.22–5.81)
RDW: 14.6 % (ref 11.5–15.5)
WBC: 15.6 10*3/uL — ABNORMAL HIGH (ref 4.0–10.5)
nRBC: 0 % (ref 0.0–0.2)

## 2022-03-28 LAB — COOXEMETRY PANEL
Carboxyhemoglobin: 1.3 % (ref 0.5–1.5)
Methemoglobin: <0.7 % (ref 0.0–1.5)
O2 Saturation: 61.1 %
Total hemoglobin: 10.6 g/dL — ABNORMAL LOW (ref 12.0–16.0)

## 2022-03-28 MED ORDER — AMIODARONE HCL 200 MG PO TABS
400.0000 mg | ORAL_TABLET | Freq: Two times a day (BID) | ORAL | Status: DC
Start: 2022-03-28 — End: 2022-04-02
  Administered 2022-03-28 – 2022-04-02 (×11): 400 mg via ORAL
  Filled 2022-03-28 (×11): qty 2

## 2022-03-28 MED ORDER — POTASSIUM CHLORIDE CRYS ER 20 MEQ PO TBCR
20.0000 meq | EXTENDED_RELEASE_TABLET | ORAL | Status: AC
  Administered 2022-03-28 (×3): 20 meq via ORAL
  Filled 2022-03-28 (×3): qty 1

## 2022-03-28 MED ORDER — DM-GUAIFENESIN ER 30-600 MG PO TB12
1.0000 | ORAL_TABLET | Freq: Two times a day (BID) | ORAL | Status: DC
  Administered 2022-03-28 – 2022-04-02 (×11): 1 via ORAL
  Filled 2022-03-28 (×11): qty 1

## 2022-03-28 MED ORDER — BENZONATATE 100 MG PO CAPS
100.0000 mg | ORAL_CAPSULE | Freq: Two times a day (BID) | ORAL | Status: DC
  Administered 2022-03-28 – 2022-04-02 (×11): 100 mg via ORAL
  Filled 2022-03-28 (×11): qty 1

## 2022-03-28 NOTE — Consult Note (Signed)
Ref: Andria Frames, PA-C ? ? ?Subjective:  ?Positive cough at night.  ?VS stable. ?Good diuresis over 48 hours. ? ?Objective:  ?Vital Signs in the last 24 hours: ?Temp:  [97.9 ?F (36.6 ?C)-98.6 ?F (37 ?C)] 98.6 ?F (37 ?C) (04/15 1130) ?Pulse Rate:  [69-111] 94 (04/15 1130) ?Cardiac Rhythm: Atrial fibrillation (04/15 0800) ?Resp:  [11-28] 11 (04/15 1130) ?BP: (87-135)/(44-115) 115/64 (04/15 1130) ?SpO2:  [89 %-97 %] 91 % (04/15 1130) ?Weight:  [141.6 kg] 141.6 kg (04/15 0500) ? ?Physical Exam: ?BP Readings from Last 1 Encounters:  ?03/28/22 115/64  ?   ?Wt Readings from Last 1 Encounters:  ?03/28/22 (!) 141.6 kg  ?  Weight change: -0.7 kg Body mass index is 41.19 kg/m?. ?HEENT: [place]/AT, Eyes-Blue, Conjunctiva-Pink, Sclera-Non-icteric ?Neck: No JVD, No bruit, Trachea midline. ?Lungs:  Clearing, Bilateral. ?Cardiac:  IrrRegular rhythm, normal S1 and S2, no S3. II/VI systolic murmur. ?Abdomen:  Soft, non-tender. BS present. ?Extremities:  No edema present. No cyanosis. No clubbing. ?CNS: AxOx3, Cranial nerves grossly intact, moves all 4 extremities.  ?Skin: Warm and dry. ? ? ?Intake/Output from previous day: ?04/14 0701 - 04/15 0700 ?In: 1174.5 [P.O.:400; I.V.:724.5; IV Piggyback:50.1] ?Out: [id number] ? ? ? ?Lab Results: ?BMET ?   ?Component Value Date/Time  ? NA 130 (L) 03/28/2022 0420  ? NA 131 (L) 03/27/2022 0418  ? NA 132 (L) 03/26/2022 0353  ? NA 140 03/07/2020 1446  ? NA 140 08/02/2019 1419  ? NA 140 07/13/2019 1218  ? K 3.7 03/28/2022 0420  ? K 3.3 (L) 03/27/2022 0418  ? K 4.3 03/26/2022 0353  ? CL 93 (L) 03/28/2022 0420  ? CL 93 (L) 03/27/2022 0418  ? CL 96 (L) 03/26/2022 0353  ? CO2 30 03/28/2022 0420  ? CO2 29 03/27/2022 0418  ? CO2 30 03/26/2022 0353  ? GLUCOSE 175 (H) 03/28/2022 0420  ? GLUCOSE 149 (H) 03/27/2022 0418  ? GLUCOSE 149 (H) 03/26/2022 0353  ? BUN 12 03/28/2022 0420  ? BUN 10 03/27/2022 0418  ? BUN 9 03/26/2022 0353  ? BUN 19 03/07/2020 1446  ? BUN 15 08/02/2019 1419  ? BUN 14  07/13/2019 1218  ? CREATININE 0.71 03/28/2022 0420  ? CREATININE 0.68 03/27/2022 0418  ? CREATININE 0.65 03/26/2022 0353  ? CREATININE 1.08 04/23/2015 1443  ? CREATININE 1.00 03/15/2015 1542  ? CALCIUM 8.6 (L) 03/28/2022 0420  ? CALCIUM 8.7 (L) 03/27/2022 0418  ? CALCIUM 9.1 03/26/2022 0353  ? GFRNONAA >60 03/28/2022 0420  ? GFRNONAA >60 03/27/2022 0418  ? GFRNONAA >60 03/26/2022 0353  ? GFRNONAA 83 03/15/2015 1542  ? GFRAA 118 03/07/2020 1446  ? GFRAA 115 08/02/2019 1419  ? GFRAA 123 07/13/2019 1218  ? GFRAA >89 03/15/2015 1542  ? ?CBC ?   ?Component Value Date/Time  ? WBC 15.6 (H) 03/28/2022 0420  ? RBC 4.06 (L) 03/28/2022 0420  ? HGB 11.7 (L) 03/28/2022 0420  ? HGB 15.5 11/30/2018 1204  ? HCT 35.0 (L) 03/28/2022 0420  ? HCT 45.3 11/30/2018 1204  ? PLT 238 03/28/2022 0420  ? PLT 229 11/30/2018 1204  ? MCV 86.2 03/28/2022 0420  ? MCV 88 11/30/2018 1204  ? MCH 28.8 03/28/2022 0420  ? MCHC 33.4 03/28/2022 0420  ? RDW 14.6 03/28/2022 0420  ? RDW 12.0 (L) 11/30/2018 1204  ? LYMPHSABS 3.0 06/12/2019 1603  ? MONOABS 0.7 06/12/2019 1603  ? EOSABS 0.3 06/12/2019 1603  ? BASOSABS 0.1 06/12/2019 1603  ? ?HEPATIC Function Panel ?Recent Labs  ?  03/20/22 ?0732 03/27/22 ?[id number] 03/28/22 ?[id number]  ?PROT 6.1* 6.1* 5.7*  ? ?HEMOGLOBIN A1C ?No components found for: HGA1C,  MPG ?CARDIAC ENZYMES ?Lab Results  ?Component Value Date  ? TROPONINI 0.04 (H) 03/05/2015  ? TROPONINI 0.05 (H) 03/05/2015  ? TROPONINI 0.05 (H) 03/05/2015  ? ?BNP ?No results for input(s): PROBNP in the last 8760 hours. ?TSH ?Recent Labs  ?  03/20/22 ?0732  ?TSH 2.492  ? ?CHOLESTEROL ?Recent Labs  ?  03/15/22 ?0408  ?CHOL 165  ? ? ?Scheduled Meds: ? acetaminophen  1,000 mg Oral Q6H  ? Or  ? acetaminophen (TYLENOL) oral liquid 160 mg/5 mL  1,000 mg Per Tube Q6H  ? amiodarone  400 mg Oral BID  ? apixaban  5 mg Oral BID  ? aspirin EC  81 mg Oral Daily  ? atorvastatin  80 mg Oral q1800  ? bisacodyl  10 mg Oral Daily  ? Or  ? bisacodyl  10 mg Rectal Daily  ? Chlorhexidine  Gluconate Cloth  6 each Topical Daily  ? Chlorhexidine Gluconate Cloth  6 each Topical Daily  ? digoxin  0.25 mg Oral Daily  ? docusate sodium  200 mg Oral Daily  ? fluticasone furoate-vilanterol  1 puff Inhalation Daily  ? furosemide  40 mg Intravenous BID  ? insulin aspart  0-24 Units Subcutaneous TID AC & HS  ? insulin detemir  12 Units Subcutaneous BID  ? metoprolol tartrate  25 mg Oral BID  ? Or  ? metoprolol tartrate  25 mg Per Tube BID  ? montelukast  10 mg Per Tube QHS  ? pantoprazole  40 mg Oral Daily  ? potassium chloride  20 mEq Oral BID  ? potassium chloride  20 mEq Oral Q4H  ? sodium chloride flush  10-40 mL Intracatheter Q12H  ? sodium chloride flush  10-40 mL Intracatheter Q12H  ? sodium chloride flush  3 mL Intravenous Q12H  ? umeclidinium bromide  1 puff Inhalation Daily  ? ?Continuous Infusions: ? sodium chloride Stopped (03/25/22 0105)  ? sodium chloride    ? sodium chloride 10 mL/hr at 03/27/22 1733  ? diltiazem (CARDIZEM) infusion 5 mg/hr (03/28/22 1200)  ? lactated ringers    ? lactated ringers Stopped (03/26/22 0600)  ? ?PRN Meds:.sodium chloride, ALPRAZolam, dextrose, hydrOXYzine, metoprolol tartrate, morphine injection, ondansetron (ZOFRAN) IV, oxyCODONE, sodium chloride flush, sodium chloride flush, sodium chloride flush, temazepam, traMADol ? ?Assessment/Plan: ? 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 ?Plan: ?Add Tessalon pearls and Mucinex for cough. ?Increase acitivity as tolerated. ? ? ? LOS: 14 days  ? ?Time spent including chart review, lab review, examination, discussion with patient :  min ? ? ?Dixie Dials  MD  ?03/28/2022, 12:22 PM ? ? ? ? ?

## 2022-03-28 NOTE — Progress Notes (Signed)
? ?   ?  [address] ?      [place] [address] ?            [phone]   ? ?  ?Up in chair, no complaints, hopes to get out of unit tomorrow ? ?BP 119/73   Pulse 95   Temp 98.7 ?F (37.1 ?C) (Oral)   Resp 20   Ht 6\' 1"  (1.854 m)   Wt (!) 141.6 kg   SpO2 (!) 89%   BMI 41.19 kg/m?  ?In a fib with controlled VR ? ?Intake/Output Summary (Last 24 hours) at 03/28/2022 1703 ?Last data filed at 03/28/2022 1659 ?Gross per 24 hour  ?Intake 722.98 ml  ?Output 2165 ml  ?Net -1442.02 ml  ? ?Doing well continue current Rx ? ?03/30/2022. Salvatore Decent, MD ?[facility] ?[phone] ? ?

## 2022-03-28 NOTE — Progress Notes (Signed)
4 Days Post-Op Procedure(s) (LRB): ?CORONARY ARTERY BYPASS GRAFTING (CABG) TIMES TWO USING LEFT INTERNAL MAMMARY ARTERY AND ENDOSCOPICALLY HARVESTED RIGHT GREATER SAPHENOUS VEIN (N/A) ?TRANSESOPHAGEAL ECHOCARDIOGRAM (TEE) (N/A) ?ENDOVEIN HARVEST OF GREATER SAPHENOUS VEIN (Right) ?Subjective: ?Some incisional pain from coughing ? ?Objective: ?Vital signs in last 24 hours: ?Temp:  [97.9 ?F (36.6 ?C)-98.2 ?F (36.8 ?C)] 97.9 ?F (36.6 ?C) (04/15 4818) ?Pulse Rate:  [65-113] 105 (04/15 0800) ?Cardiac Rhythm: Atrial fibrillation (04/15 0800) ?Resp:  [12-28] 20 (04/15 0800) ?BP: (87-135)/(44-115) 110/70 (04/15 0800) ?SpO2:  [89 %-97 %] 90 % (04/15 0800) ?Weight:  [141.6 kg] 141.6 kg (04/15 0500) ? ?Hemodynamic parameters for last 24 hours: ?  ? ?Intake/Output from previous day: ?04/14 0701 - 04/15 0700 ?In: 1174.5 [P.O.:400; I.V.:724.5; IV Piggyback:50.1] ?Out: 1785 [Urine:1785] ?Intake/Output this shift: ?Total I/O ?In: 26.9 [I.V.:26.9] ?Out: 45 [Urine:45] ? ?General appearance: alert, cooperative, and no distress ?Neurologic: intact ?Heart: irregularly irregular rhythm ?Lungs: diminished breath sounds bibasilar ?Abdomen: normal findings: soft, non-tender ? ?Lab Results: ?Recent Labs  ?  03/27/22 ?0418 03/28/22 ?0420  ?WBC 16.4* 15.6*  ?HGB 12.0* 11.7*  ?HCT 37.2* 35.0*  ?PLT 238 238  ? ?BMET:  ?Recent Labs  ?  03/27/22 ?0418 03/28/22 ?0420  ?NA 131* 130*  ?K 3.3* 3.7  ?CL 93* 93*  ?CO2 29 30  ?GLUCOSE 149* 175*  ?BUN 10 12  ?CREATININE 0.68 0.71  ?CALCIUM 8.7* 8.6*  ?  ?PT/INR: No results for input(s): LABPROT, INR in the last 72 hours. ?ABG ?   ?Component Value Date/Time  ? PHART 7.416 03/25/2022 0556  ? HCO3 25.1 03/25/2022 0556  ? TCO2 26 03/25/2022 0556  ? ACIDBASEDEF 1.0 03/24/2022 2118  ? O2SAT 61.1 03/28/2022 0428  ? ?CBG (last 3)  ?Recent Labs  ?  03/27/22 ?1905 03/27/22 ?2308 03/28/22 ?0626  ?GLUCAP 149* 150* 122*  ? ? ?Assessment/Plan: ?S/P Procedure(s) (LRB): ?CORONARY ARTERY BYPASS GRAFTING (CABG) TIMES TWO  USING LEFT INTERNAL MAMMARY ARTERY AND ENDOSCOPICALLY HARVESTED RIGHT GREATER SAPHENOUS VEIN (N/A) ?TRANSESOPHAGEAL ECHOCARDIOGRAM (TEE) (N/A) ?ENDOVEIN HARVEST OF GREATER SAPHENOUS VEIN (Right) ?POD # 4, slowly improving ?NEURO- intact ?CV- in a fib, rate controlled for the most part with amiodarone, digoxin, lopressor, and diltiazem ? Will change amiodarone to PO ? Co-ox- 61 in milrinone 0.125- dc milrinone ? Start Eliquis today ?RESP- continue IS ?RENAL- creatinine normal ? Continue diuresis ? Supplement K ?ENDO- CBG controlled ?GI- nausea resolved, refused Reglan last night ?Mobilize  ? ? LOS: 14 days  ? ? ?Loreli Slot ?03/28/2022 ? ? ?

## 2022-03-29 ENCOUNTER — Inpatient Hospital Stay (HOSPITAL_COMMUNITY): Payer: Medicare (Managed Care)

## 2022-03-29 LAB — COOXEMETRY PANEL
Carboxyhemoglobin: 1.4 % (ref 0.5–1.5)
Methemoglobin: <0.7 % (ref 0.0–1.5)
O2 Saturation: 63.8 %
Total hemoglobin: 12.5 g/dL (ref 12.0–16.0)

## 2022-03-29 LAB — GLUCOSE, CAPILLARY
Glucose-Capillary: 111 mg/dL — ABNORMAL HIGH (ref 70–99)
Glucose-Capillary: 122 mg/dL — ABNORMAL HIGH (ref 70–99)
Glucose-Capillary: 117 mg/dL — ABNORMAL HIGH (ref 70–99)

## 2022-03-29 LAB — CBC
HCT: 36.9 % — ABNORMAL LOW (ref 39.0–52.0)
Hemoglobin: 11.9 g/dL — ABNORMAL LOW (ref 13.0–17.0)
MCH: 27.9 pg (ref 26.0–34.0)
MCHC: 32.2 g/dL (ref 30.0–36.0)
MCV: 86.4 fL (ref 80.0–100.0)
Platelets: 309 10*3/uL (ref 150–400)
RBC: 4.27 MIL/uL (ref 4.22–5.81)
RDW: 14.7 % (ref 11.5–15.5)
WBC: 16.6 10*3/uL — ABNORMAL HIGH (ref 4.0–10.5)
nRBC: 0 % (ref 0.0–0.2)

## 2022-03-29 LAB — COMPREHENSIVE METABOLIC PANEL
ALT: 16 U/L (ref 0–44)
AST: 17 U/L (ref 15–41)
Albumin: 2.7 g/dL — ABNORMAL LOW (ref 3.5–5.0)
Alkaline Phosphatase: 55 U/L (ref 38–126)
Anion gap: 7 (ref 5–15)
BUN: 11 mg/dL (ref 8–23)
CO2: 30 mmol/L (ref 22–32)
Calcium: 8.6 mg/dL — ABNORMAL LOW (ref 8.9–10.3)
Chloride: 93 mmol/L — ABNORMAL LOW (ref 98–111)
Creatinine, Ser: 0.65 mg/dL (ref 0.61–1.24)
GFR, Estimated: >60 mL/min (ref >60)
Glucose, Bld: 134 mg/dL — ABNORMAL HIGH (ref 70–99)
Potassium: 3.7 mmol/L (ref 3.5–5.1)
Sodium: 130 mmol/L — ABNORMAL LOW (ref 135–145)
Total Bilirubin: 1 mg/dL (ref 0.3–1.2)
Total Protein: 6 g/dL — ABNORMAL LOW (ref 6.5–8.1)

## 2022-03-29 MED ORDER — POTASSIUM CHLORIDE CRYS ER 20 MEQ PO TBCR
20.0000 meq | EXTENDED_RELEASE_TABLET | ORAL | Status: AC
  Administered 2022-03-29 (×3): 20 meq via ORAL
  Filled 2022-03-29 (×3): qty 1

## 2022-03-29 MED ORDER — DILTIAZEM HCL ER COATED BEADS 120 MG PO CP24
120.0000 mg | ORAL_CAPSULE | Freq: Every day | ORAL | Status: DC
  Administered 2022-03-29: 120 mg via ORAL
  Filled 2022-03-29 (×2): qty 1

## 2022-03-29 MED ORDER — FUROSEMIDE 40 MG PO TABS
40.0000 mg | ORAL_TABLET | Freq: Two times a day (BID) | ORAL | Status: DC
  Administered 2022-03-29: 40 mg via ORAL
  Filled 2022-03-29: qty 1

## 2022-03-29 NOTE — Progress Notes (Signed)
5 Days Post-Op Procedure(s) (LRB): ?CORONARY ARTERY BYPASS GRAFTING (CABG) TIMES TWO USING LEFT INTERNAL MAMMARY ARTERY AND ENDOSCOPICALLY HARVESTED RIGHT GREATER SAPHENOUS VEIN (N/A) ?TRANSESOPHAGEAL ECHOCARDIOGRAM (TEE) (N/A) ?ENDOVEIN HARVEST OF GREATER SAPHENOUS VEIN (Right) ?Subjective: ?No complaints ? ?Objective: ?Vital signs in last 24 hours: ?Temp:  [97.8 ?F (36.6 ?C)-99.5 ?F (37.5 ?C)] 97.8 ?F (36.6 ?C) (04/16 0755) ?Pulse Rate:  [72-99] 93 (04/16 0800) ?Cardiac Rhythm: Atrial fibrillation (04/16 0400) ?Resp:  [11-24] 15 (04/16 0600) ?BP: (90-126)/(48-91) 102/72 (04/16 0800) ?SpO2:  [89 %-100 %] 93 % (04/16 0807) ?Weight:  [142.4 kg] 142.4 kg (04/16 0500) ? ?Hemodynamic parameters for last 24 hours: ?  ? ?Intake/Output from previous day: ?04/15 0701 - 04/16 0700 ?In: 421 [P.O.:240; I.V.:181] ?Out: 2990 [Urine:2990] ?Intake/Output this shift: ?Total I/O ?In: 10 [I.V.:10] ?Out: 300 [Urine:300] ? ?General appearance: alert, cooperative, and no distress ?Neurologic: intact ?Heart: irregularly irregular rhythm ?Lungs: diminished breath sounds bibasilar ?Abdomen: normal findings: soft, non-tender ?Diminished BS Left base not bibasilar ? ?Lab Results: ?Recent Labs  ?  03/28/22 ?0420 03/29/22 ?0454  ?WBC 15.6* 16.6*  ?HGB 11.7* 11.9*  ?HCT 35.0* 36.9*  ?PLT 238 309  ? ?BMET:  ?Recent Labs  ?  03/28/22 ?0420 03/29/22 ?0454  ?NA 130* 130*  ?K 3.7 3.7  ?CL 93* 93*  ?CO2 30 30  ?GLUCOSE 175* 134*  ?BUN 12 11  ?CREATININE 0.71 0.65  ?CALCIUM 8.6* 8.6*  ?  ?PT/INR: No results for input(s): LABPROT, INR in the last 72 hours. ?ABG ?   ?Component Value Date/Time  ? PHART 7.416 03/25/2022 0556  ? HCO3 25.1 03/25/2022 0556  ? TCO2 26 03/25/2022 0556  ? ACIDBASEDEF 1.0 03/24/2022 2118  ? O2SAT 63.8 03/29/2022 0454  ? ?CBG (last 3)  ?Recent Labs  ?  03/28/22 ?1608 03/28/22 ?2128 03/29/22 ?0611  ?GLUCAP 131* 153* 111*  ? ? ?Assessment/Plan: ?S/P Procedure(s) (LRB): ?CORONARY ARTERY BYPASS GRAFTING (CABG) TIMES TWO USING LEFT  INTERNAL MAMMARY ARTERY AND ENDOSCOPICALLY HARVESTED RIGHT GREATER SAPHENOUS VEIN (N/A) ?TRANSESOPHAGEAL ECHOCARDIOGRAM (TEE) (N/A) ?ENDOVEIN HARVEST OF GREATER SAPHENOUS VEIN (Right) ?Plan for transfer to step-down: see transfer orders ?Continues to make good progress ?NEURO_ intact ?CV- in atrial fib with controlled VR, co-ox 64 ? Change diltiazem to PO ? Continue amiodarone, metoprolol and digoxin ?RESP_ IS, has some elevation left hemidiaphragm/ left effusion ?RENAL- creatinine normal, supplement K ?ENDO- CBG well controlled ?GI- tolerating PO ?Deconditioning- continue cardiac rehab ? ? LOS: 15 days  ? ? ?Loreli Slot ?03/29/2022 ? ? ?

## 2022-03-29 NOTE — Discharge Instructions (Signed)

## 2022-03-29 NOTE — Progress Notes (Signed)
Ref: Virgilio Belling, PA-C ? ? ?Subjective:  ?Awake. Sitting up. ?VS stable. HR in 80-90's. A. Fib continues. ?He has improved cough control and had good night sleep in 4 days. ? ?Objective:  ?Vital Signs in the last 24 hours: ?Temp:  [97.8 ?F (36.6 ?C)-99.5 ?F (37.5 ?C)] 97.8 ?F (36.6 ?C) (04/16 0755) ?Pulse Rate:  [72-99] 91 (04/16 0930) ?Cardiac Rhythm: Atrial fibrillation (04/16 0400) ?Resp:  [11-24] 15 (04/16 0730) ?BP: (90-126)/(48-91) 103/70 (04/16 0930) ?SpO2:  [89 %-100 %] 93 % (04/16 0930) ?Weight:  [142.4 kg] 142.4 kg (04/16 0500) ? ?Physical Exam: ?BP Readings from Last 1 Encounters:  ?03/29/22 103/70  ?   ?Wt Readings from Last 1 Encounters:  ?03/29/22 (!) 142.4 kg  ?  Weight change: 0.8 kg Body mass index is 41.42 kg/m?. ?HEENT: [place]/AT, Eyes-Blue, Conjunctiva-Pink, Sclera-Non-icteric ?Neck: No JVD, No bruit, Trachea midline. ?Lungs:  Clearing, Bilateral. Mid line scar of surgery. ?Cardiac:  Regular rhythm, normal S1 and S2, no S3. II/VI systolic murmur. ?Abdomen:  Soft, non-tender. BS present. ?Extremities:  1 + edema present. No cyanosis. No clubbing. ?CNS: AxOx3, Cranial nerves grossly intact, moves all 4 extremities.  ?Skin: Warm and dry. ? ? ?Intake/Output from previous day: ?04/15 0701 - 04/16 0700 ?In: 421 [P.O.:240; I.V.:181] ?Out: 2990 [Urine:2990] ? ? ? ?Lab Results: ?BMET ?   ?Component Value Date/Time  ? NA 130 (L) 03/29/2022 0454  ? NA 130 (L) 03/28/2022 0420  ? NA 131 (L) 03/27/2022 0418  ? NA 140 03/07/2020 1446  ? NA 140 08/02/2019 1419  ? NA 140 07/13/2019 1218  ? K 3.7 03/29/2022 0454  ? K 3.7 03/28/2022 0420  ? K 3.3 (L) 03/27/2022 0418  ? CL 93 (L) 03/29/2022 0454  ? CL 93 (L) 03/28/2022 0420  ? CL 93 (L) 03/27/2022 0418  ? CO2 30 03/29/2022 0454  ? CO2 30 03/28/2022 0420  ? CO2 29 03/27/2022 0418  ? GLUCOSE 134 (H) 03/29/2022 0454  ? GLUCOSE 175 (H) 03/28/2022 0420  ? GLUCOSE 149 (H) 03/27/2022 0418  ? BUN 11 03/29/2022 0454  ? BUN 12 03/28/2022 0420  ? BUN 10 03/27/2022 0418  ?  BUN 19 03/07/2020 1446  ? BUN 15 08/02/2019 1419  ? BUN 14 07/13/2019 1218  ? CREATININE 0.65 03/29/2022 0454  ? CREATININE 0.71 03/28/2022 0420  ? CREATININE 0.68 03/27/2022 0418  ? CREATININE 1.08 04/23/2015 1443  ? CREATININE 1.00 03/15/2015 1542  ? CALCIUM 8.6 (L) 03/29/2022 0454  ? CALCIUM 8.6 (L) 03/28/2022 0420  ? CALCIUM 8.7 (L) 03/27/2022 0418  ? GFRNONAA >60 03/29/2022 0454  ? GFRNONAA >60 03/28/2022 0420  ? GFRNONAA >60 03/27/2022 0418  ? GFRNONAA 83 03/15/2015 1542  ? GFRAA 118 03/07/2020 1446  ? GFRAA 115 08/02/2019 1419  ? GFRAA 123 07/13/2019 1218  ? GFRAA >89 03/15/2015 1542  ? ?CBC ?   ?Component Value Date/Time  ? WBC 16.6 (H) 03/29/2022 0454  ? RBC 4.27 03/29/2022 0454  ? HGB 11.9 (L) 03/29/2022 0454  ? HGB 15.5 11/30/2018 1204  ? HCT 36.9 (L) 03/29/2022 0454  ? HCT 45.3 11/30/2018 1204  ? PLT 309 03/29/2022 0454  ? PLT 229 11/30/2018 1204  ? MCV 86.4 03/29/2022 0454  ? MCV 88 11/30/2018 1204  ? MCH 27.9 03/29/2022 0454  ? MCHC 32.2 03/29/2022 0454  ? RDW 14.7 03/29/2022 0454  ? RDW 12.0 (L) 11/30/2018 1204  ? LYMPHSABS 3.0 06/12/2019 1603  ? MONOABS 0.7 06/12/2019 1603  ? EOSABS  0.3 06/12/2019 1603  ? BASOSABS 0.1 06/12/2019 1603  ? ?HEPATIC Function Panel ?Recent Labs  ?  03/27/22 ?0418 03/28/22 ?0420 03/29/22 ?0454  ?PROT 6.1* 5.7* 6.0*  ? ?HEMOGLOBIN A1C ?No components found for: HGA1C,  MPG ?CARDIAC ENZYMES ?Lab Results  ?Component Value Date  ? TROPONINI 0.04 (H) 03/05/2015  ? TROPONINI 0.05 (H) 03/05/2015  ? TROPONINI 0.05 (H) 03/05/2015  ? ?BNP ?No results for input(s): PROBNP in the last 8760 hours. ?TSH ?Recent Labs  ?  03/20/22 ?0732  ?TSH 2.492  ? ?CHOLESTEROL ?Recent Labs  ?  03/15/22 ?0408  ?CHOL 165  ? ? ?Scheduled Meds: ? acetaminophen  1,000 mg Oral Q6H  ? Or  ? acetaminophen (TYLENOL) oral liquid 160 mg/5 mL  1,000 mg Per Tube Q6H  ? amiodarone  400 mg Oral BID  ? apixaban  5 mg Oral BID  ? aspirin EC  81 mg Oral Daily  ? atorvastatin  80 mg Oral q1800  ? benzonatate  100 mg Oral  BID  ? bisacodyl  10 mg Oral Daily  ? Or  ? bisacodyl  10 mg Rectal Daily  ? Chlorhexidine Gluconate Cloth  6 each Topical Daily  ? Chlorhexidine Gluconate Cloth  6 each Topical Daily  ? dextromethorphan-guaiFENesin  1 tablet Oral BID  ? digoxin  0.25 mg Oral Daily  ? diltiazem  120 mg Oral Daily  ? docusate sodium  200 mg Oral Daily  ? fluticasone furoate-vilanterol  1 puff Inhalation Daily  ? furosemide  40 mg Oral BID  ? insulin aspart  0-24 Units Subcutaneous TID AC & HS  ? insulin detemir  12 Units Subcutaneous BID  ? metoprolol tartrate  25 mg Oral BID  ? Or  ? metoprolol tartrate  25 mg Per Tube BID  ? montelukast  10 mg Per Tube QHS  ? pantoprazole  40 mg Oral Daily  ? potassium chloride  20 mEq Oral BID  ? potassium chloride  20 mEq Oral Q4H  ? sodium chloride flush  10-40 mL Intracatheter Q12H  ? sodium chloride flush  10-40 mL Intracatheter Q12H  ? sodium chloride flush  3 mL Intravenous Q12H  ? umeclidinium bromide  1 puff Inhalation Daily  ? ?Continuous Infusions: ? sodium chloride Stopped (03/25/22 0105)  ? sodium chloride    ? sodium chloride 10 mL/hr at 03/27/22 1733  ? diltiazem (CARDIZEM) infusion 5 mg/hr (03/29/22 0900)  ? lactated ringers    ? lactated ringers Stopped (03/26/22 0600)  ? ?PRN Meds:.sodium chloride, ALPRAZolam, dextrose, hydrOXYzine, metoprolol tartrate, morphine injection, ondansetron (ZOFRAN) IV, oxyCODONE, sodium chloride flush, sodium chloride flush, sodium chloride flush, temazepam, traMADol ? ?Assessment/Plan: ? Unstable angina ?Multivessel CAD ?S/P 2 V CABG, post op day 5 ?HTN ?Morbid obesity ?COPD ?Chronic atrial fibrillation ? ?Plan: ?Continue diuresis as tolerated and increase activity. ? ? LOS: 15 days  ? ?Time spent including chart review, lab review, examination, discussion with patient : 30 min ? ? ?[name]  MD  ?03/29/2022, 11:24 AM ? ? ? ? ?

## 2022-03-30 ENCOUNTER — Inpatient Hospital Stay (HOSPITAL_COMMUNITY): Payer: Medicare (Managed Care)

## 2022-03-30 LAB — COMPREHENSIVE METABOLIC PANEL
ALT: 18 U/L (ref 0–44)
AST: 19 U/L (ref 15–41)
Albumin: 2.7 g/dL — ABNORMAL LOW (ref 3.5–5.0)
Alkaline Phosphatase: 55 U/L (ref 38–126)
Anion gap: 9 (ref 5–15)
BUN: 14 mg/dL (ref 8–23)
CO2: 28 mmol/L (ref 22–32)
Calcium: 9 mg/dL (ref 8.9–10.3)
Chloride: 96 mmol/L — ABNORMAL LOW (ref 98–111)
Creatinine, Ser: 0.69 mg/dL (ref 0.61–1.24)
GFR, Estimated: >60 mL/min (ref >60)
Glucose, Bld: 116 mg/dL — ABNORMAL HIGH (ref 70–99)
Potassium: 4.1 mmol/L (ref 3.5–5.1)
Sodium: 133 mmol/L — ABNORMAL LOW (ref 135–145)
Total Bilirubin: 1.2 mg/dL (ref 0.3–1.2)
Total Protein: 6.2 g/dL — ABNORMAL LOW (ref 6.5–8.1)

## 2022-03-30 LAB — CBC
HCT: 37.5 % — ABNORMAL LOW (ref 39.0–52.0)
Hemoglobin: 11.9 g/dL — ABNORMAL LOW (ref 13.0–17.0)
MCH: 27.5 pg (ref 26.0–34.0)
MCHC: 31.7 g/dL (ref 30.0–36.0)
MCV: 86.8 fL (ref 80.0–100.0)
Platelets: 365 10*3/uL (ref 150–400)
RBC: 4.32 MIL/uL (ref 4.22–5.81)
RDW: 14.7 % (ref 11.5–15.5)
WBC: 13.9 10*3/uL — ABNORMAL HIGH (ref 4.0–10.5)
nRBC: 0 % (ref 0.0–0.2)

## 2022-03-30 LAB — GLUCOSE, CAPILLARY
Glucose-Capillary: 140 mg/dL — ABNORMAL HIGH (ref 70–99)
Glucose-Capillary: 121 mg/dL — ABNORMAL HIGH (ref 70–99)
Glucose-Capillary: 127 mg/dL — ABNORMAL HIGH (ref 70–99)
Glucose-Capillary: 104 mg/dL — ABNORMAL HIGH (ref 70–99)
Glucose-Capillary: 156 mg/dL — ABNORMAL HIGH (ref 70–99)

## 2022-03-30 LAB — COOXEMETRY PANEL
Carboxyhemoglobin: 1.3 % (ref 0.5–1.5)
Methemoglobin: <0.7 % (ref 0.0–1.5)
O2 Saturation: 64.7 %
Total hemoglobin: 12.3 g/dL (ref 12.0–16.0)

## 2022-03-30 MED ORDER — MONTELUKAST SODIUM 10 MG PO TABS
10.0000 mg | ORAL_TABLET | Freq: Every day | ORAL | Status: DC
  Administered 2022-03-31 – 2022-04-01 (×2): 10 mg via ORAL
  Filled 2022-03-30 (×2): qty 1

## 2022-03-30 MED ORDER — LIDOCAINE HCL 1 % IJ SOLN
INTRAMUSCULAR | Status: AC
  Filled 2022-03-30: qty 20

## 2022-03-30 MED ORDER — FUROSEMIDE 40 MG PO TABS
40.0000 mg | ORAL_TABLET | Freq: Every day | ORAL | Status: DC
  Administered 2022-03-31 – 2022-04-01 (×2): 40 mg via ORAL
  Filled 2022-03-30 (×2): qty 1

## 2022-03-30 NOTE — Progress Notes (Signed)
Physical Therapy Treatment ?Patient Details ?Name: Arthur Church ?MRN: [id number] ?DOB: December 16, 1955 ?Today's Date: 03/30/2022 ? ? ?History of Present Illness 66 y.o. M who presents with chest pain. Admitted with acute coronary syndrome and NSTEMI  now s/p CABG x 2 4/11. Significant PMH: obesity, PAF, COPD, DM2. ? ?  ?PT Comments  ? ? Pt making excellent progress towards his physical therapy goals. Pt reports ambulating 3 laps around unit this AM. Agreeable for additional PT session. Focus on sitting/standing therapeutic exercises for strengthening and endurance. Pt ambulating 370 ft with no assistive device at a supervision level. HR 92-116 afib, SpO2 90-93% on RA, BP 142/60 (78) post mobility. Will continue to progress as tolerated. ?   ?Recommendations for follow up therapy are one component of a multi-disciplinary discharge planning process, led by the attending physician.  Recommendations may be updated based on patient status, additional functional criteria and insurance authorization. ? ?Follow Up Recommendations ? Home health PT ?  ?  ?Assistance Recommended at Discharge Frequent or constant Supervision/Assistance  ?Patient can return home with the following A little help with walking and/or transfers;A little help with bathing/dressing/bathroom;Assistance with cooking/housework;Assist for transportation;Help with stairs or ramp for entrance ?  ?Equipment Recommendations ? Rolling walker (2 wheels) (bariatric equipment)  ?  ?Recommendations for Other Services   ? ? ?  ?Precautions / Restrictions Precautions ?Precautions: Sternal;Fall;Other (comment) ?Precaution Booklet Issued: No ?Precaution Comments: watch SpO2 ?Restrictions ?Weight Bearing Restrictions: Yes ?Other Position/Activity Restrictions: sternal precautions  ?  ? ?Mobility ? Bed Mobility ?  ?  ?  ?  ?  ?  ?  ?General bed mobility comments: Pt up in chair upon arrival. ?  ? ?Transfers ?Overall transfer level: Needs assistance ?Equipment used:  None ?Transfers: Sit to/from Stand ?Sit to Stand: Supervision ?  ?  ?  ?  ?  ?General transfer comment: Cues for hands on knees, rocking forward to gain momentum, good eccentric control with descent ?  ? ?Ambulation/Gait ?Ambulation/Gait assistance: Supervision ?Gait Distance (Feet): 370 Feet ?Assistive device: None ?Gait Pattern/deviations: Step-through pattern, Decreased stride length ?  ?  ?  ?General Gait Details: Mild dynamic instability without RW, decreased stride length, supervision for safety. no gross imbalance and steady pace ? ? ?Stairs ?  ?  ?  ?  ?  ? ? ?Wheelchair Mobility ?  ? ?Modified Rankin (Stroke Patients Only) ?  ? ? ?  ?Balance Overall balance assessment: Needs assistance ?Sitting-balance support: No upper extremity supported, Feet supported ?Sitting balance-Leahy Scale: Good ?  ?  ?Standing balance support: No upper extremity supported, During functional activity ?Standing balance-Leahy Scale: Good ?  ?  ?  ?  ?  ?  ?  ?  ?  ?  ?  ?  ?  ? ?  ?Cognition Arousal/Alertness: Awake/alert ?Behavior During Therapy: [facility] for tasks assessed/performed ?Overall Cognitive Status: Within Functional Limits for tasks assessed ?  ?  ?  ?  ?  ?  ?  ?  ?  ?  ?  ?  ?  ?  ?  ?  ?  ?  ?  ? ?  ?Exercises General Exercises - Lower Extremity ?Long Arc Quad: Both, Seated, 15 reps ?Hip ABduction/ADduction: Both, 10 reps, Standing ?Hip Flexion/Marching: Both, 10 reps, Seated ?Heel Raises: 5 reps, 15 reps, Standing ?Other Exercises ?Other Exercises: Seated: scapular retractions 3 s hold x 15 ?Other Exercises: Standing: bilat hamstring curls x 10 each ? ?  ?General Comments   ?  ?  ? ?  Pertinent Vitals/Pain Pain Assessment ?Pain Assessment: Faces ?Faces Pain Scale: Hurts a little bit ?Pain Location: sternum with coughing ?Pain Descriptors / Indicators: Discomfort, Operative site guarding ?Pain Intervention(s): Monitored during session  ? ? ?Home Living   ?  ?  ?  ?  ?  ?  ?  ?  ?  ?   ?  ?Prior Function    ?  ?  ?   ? ?PT  Goals (current goals can now be found in the care plan section) Acute Rehab PT Goals ?Patient Stated Goal: to walk ?PT Goal Formulation: With patient/family ?Time For Goal Achievement: 04/08/22 ?Potential to Achieve Goals: Good ?Progress towards PT goals: Progressing toward goals ? ?  ?Frequency ? ? ? Min 3X/week ? ? ? ?  ?PT Plan Current plan remains appropriate  ? ? ?Co-evaluation   ?  ?  ?  ?  ? ?  ?AM-PAC PT "6 Clicks" Mobility   ?Outcome Measure ? Help needed turning from your back to your side while in a flat bed without using bedrails?: A Little ?Help needed moving from lying on your back to sitting on the side of a flat bed without using bedrails?: A Little ?Help needed moving to and from a bed to a chair (including a wheelchair)?: A Little ?Help needed standing up from a chair using your arms (e.g., wheelchair or bedside chair)?: A Little ?Help needed to walk in hospital room?: A Little ?Help needed climbing 3-5 steps with a railing? : A Little ?6 Click Score: 18 ? ?  ?End of Session   ?Activity Tolerance: Patient tolerated treatment well ?Patient left: in chair;with call bell/phone within reach;with family/visitor present ?Nurse Communication: Mobility status ?PT Visit Diagnosis: Unsteadiness on feet (R26.81);Difficulty in walking, not elsewhere classified (R26.2);Pain;Other abnormalities of gait and mobility (R26.89) ?Pain - part of body:  (sternum) ?  ? ? ?Time: [id number] ?PT Time Calculation (min) (ACUTE ONLY): 22 min ? ?Charges:  $Therapeutic Exercise: 8-22 mins          ?          ? ?Wyona Almas, PT, DPT ?Acute Rehabilitation Services ?Pager [phone] ?Office [phone] ? ? ? ?Carloine Margo Aye ?03/30/2022, 8:46 AM ? ?

## 2022-03-30 NOTE — Progress Notes (Signed)
Patient ID: Arthur Church, male   DOB: 06-17-1956, 66 y.o.   MRN: [id number] ?TCTS Evening Rounds: ? ?Hemodynamically stable in controlled atrial fib. ?Sats 93% RA. ? ?Had [place] of chest today to assess for left effusion but there was none. ?

## 2022-03-30 NOTE — Progress Notes (Signed)
CARDIAC REHAB PHASE I  ? ?PRE:  Rate/Rhythm: 92 afib ? ?  BP: sitting 125/73 ? ?  SaO2: 92 RA ? ?MODE:  Ambulation: 740 ft  ? ?POST:  Rate/Rhythm: 113 afib ? ?  BP: sitting 141/66  ? ?  SaO2: 90 RA ? ?Pt stood independently and walked independently, no rest needed. VSS. Doing great, return to recliner. ?[id number]  ? ?Arthur Church CES, ACSM ?03/30/2022 ?1:35 PM ? ? ? ? ?

## 2022-03-30 NOTE — Progress Notes (Signed)
6 Days Post-Op Procedure(s) (LRB): ?CORONARY ARTERY BYPASS GRAFTING (CABG) TIMES TWO USING LEFT INTERNAL MAMMARY ARTERY AND ENDOSCOPICALLY HARVESTED RIGHT GREATER SAPHENOUS VEIN (N/A) ?TRANSESOPHAGEAL ECHOCARDIOGRAM (TEE) (N/A) ?ENDOVEIN HARVEST OF GREATER SAPHENOUS VEIN (Right) ?Subjective: ?Cont to do well in controlled afib on Eliquis, good coox ?He c/o cough this am - prob related to increased L pleural effusion- wil ask IR to drain ? ?Objective: ?Vital signs in last 24 hours: ?Temp:  [98 ?F (36.7 ?C)-98.1 ?F (36.7 ?C)] 98 ?F (36.7 ?C) (04/17 0800) ?Pulse Rate:  [69-95] 92 (04/17 0900) ?Cardiac Rhythm: Atrial fibrillation (04/17 0800) ?Resp:  [12-26] 17 (04/17 0900) ?BP: (90-152)/(52-82) 90/56 (04/17 0902) ?SpO2:  [77 %-99 %] 93 % (04/17 0900) ?Weight:  [142.8 kg] 142.8 kg (04/17 0600) ? ?Hemodynamic parameters for last 24 hours: ?  ? ?Intake/Output from previous day: ?04/16 0701 - 04/17 0700 ?In: 463.8 [P.O.:440; I.V.:23.8] ?Out: 2835 [Urine:2835] ?Intake/Output this shift: ?Total I/O ?In: 240 [P.O.:240] ?Out: 175 [Urine:175] ? ?  ?   Exam ? ?  General- alert and comfortable ?   Neck- no JVD, no cervical adenopathy palpable, no carotid bruit ?  Lungs- clear without rales, wheezes ?  Cor- regular rate and rhythm, no murmur , gallop ?  Abdomen- soft, non-tender ?  Extremities - warm, non-tender, minimal edema ?  Neuro- oriented, appropriate, no focal weakness  ? ?Lab Results: ?Recent Labs  ?  03/29/22 ?0454 03/30/22 ?2992  ?WBC 16.6* 13.9*  ?HGB 11.9* 11.9*  ?HCT 36.9* 37.5*  ?PLT 309 365  ? ?BMET:  ?Recent Labs  ?  03/29/22 ?0454 03/30/22 ?4268  ?NA 130* 133*  ?K 3.7 4.1  ?CL 93* 96*  ?CO2 30 28  ?GLUCOSE 134* 116*  ?BUN 11 14  ?CREATININE 0.65 0.69  ?CALCIUM 8.6* 9.0  ?  ?PT/INR: No results for input(s): LABPROT, INR in the last 72 hours. ?ABG ?   ?Component Value Date/Time  ? PHART 7.416 03/25/2022 0556  ? HCO3 25.1 03/25/2022 0556  ? TCO2 26 03/25/2022 0556  ? ACIDBASEDEF 1.0 03/24/2022 2118  ? O2SAT 64.7  03/30/2022 0516  ? ?CBG (last 3)  ?Recent Labs  ?  03/29/22 ?2120 03/30/22 ?0651 03/30/22 ?1126  ?GLUCAP 117* 121* 127*  ? ? ?Assessment/Plan: ?S/P Procedure(s) (LRB): ?CORONARY ARTERY BYPASS GRAFTING (CABG) TIMES TWO USING LEFT INTERNAL MAMMARY ARTERY AND ENDOSCOPICALLY HARVESTED RIGHT GREATER SAPHENOUS VEIN (N/A) ?TRANSESOPHAGEAL ECHOCARDIOGRAM (TEE) (N/A) ?ENDOVEIN HARVEST OF GREATER SAPHENOUS VEIN (Right) ?Request L thoracentesis w/ ultrasound ?Transfer to 4E after procedure ? ? LOS: 16 days  ? ? ?Lovett Sox ?03/30/2022 ?  ?

## 2022-03-30 NOTE — Progress Notes (Signed)
Ref: Virgilio Belling, PA-C ? ? ?Subjective:  ?Awake.  ?No thoracentesis per IR. ?Chronic elevation of left hemidiaphragm. ?VS stable. ? ?Objective:  ?Vital Signs in the last 24 hours: ?Temp:  [98 ?F (36.7 ?C)-98.4 ?F (36.9 ?C)] 98.4 ?F (36.9 ?C) (04/17 1630) ?Pulse Rate:  [69-115] 86 (04/17 1600) ?Cardiac Rhythm: Atrial fibrillation (04/17 0800) ?Resp:  [12-33] 19 (04/17 1600) ?BP: (90-134)/(52-82) 126/71 (04/17 1630) ?SpO2:  [77 %-99 %] 92 % (04/17 1600) ?Weight:  [142.8 kg] 142.8 kg (04/17 0600) ? ?Physical Exam: ?BP Readings from Last 1 Encounters:  ?03/30/22 126/71  ?   ?Wt Readings from Last 1 Encounters:  ?03/30/22 (!) 142.8 kg  ?  Weight change: 0.4 kg Body mass index is 41.54 kg/m?. ?HEENT: [place]/AT, Eyes-Blue, Conjunctiva-Pink, Sclera-Non-icteric ?Neck: No JVD, No bruit, Trachea midline. ?Lungs:  Clearing, Bilateral. ?Cardiac:  Irregular rhythm, normal S1 and S2, no S3. II/VI systolic murmur. ?Abdomen:  Soft, non-tender. BS present. ?Extremities:  1 + edema present. No cyanosis. No clubbing. ?CNS: AxOx3, Cranial nerves grossly intact, moves all 4 extremities.  ?Skin: Warm and dry. ? ? ?Intake/Output from previous day: ?04/16 0701 - 04/17 0700 ?In: 463.8 [P.O.:440; I.V.:23.8] ?Out: 2835 [Urine:2835] ? ? ? ?Lab Results: ?BMET ?   ?Component Value Date/Time  ? NA 133 (L) 03/30/2022 0516  ? NA 130 (L) 03/29/2022 0454  ? NA 130 (L) 03/28/2022 0420  ? NA 140 03/07/2020 1446  ? NA 140 08/02/2019 1419  ? NA 140 07/13/2019 1218  ? K 4.1 03/30/2022 0516  ? K 3.7 03/29/2022 0454  ? K 3.7 03/28/2022 0420  ? CL 96 (L) 03/30/2022 0516  ? CL 93 (L) 03/29/2022 0454  ? CL 93 (L) 03/28/2022 0420  ? CO2 28 03/30/2022 0516  ? CO2 30 03/29/2022 0454  ? CO2 30 03/28/2022 0420  ? GLUCOSE 116 (H) 03/30/2022 0516  ? GLUCOSE 134 (H) 03/29/2022 0454  ? GLUCOSE 175 (H) 03/28/2022 0420  ? BUN 14 03/30/2022 0516  ? BUN 11 03/29/2022 0454  ? BUN 12 03/28/2022 0420  ? BUN 19 03/07/2020 1446  ? BUN 15 08/02/2019 1419  ? BUN 14 07/13/2019  1218  ? CREATININE 0.69 03/30/2022 0516  ? CREATININE 0.65 03/29/2022 0454  ? CREATININE 0.71 03/28/2022 0420  ? CREATININE 1.08 04/23/2015 1443  ? CREATININE 1.00 03/15/2015 1542  ? CALCIUM 9.0 03/30/2022 0516  ? CALCIUM 8.6 (L) 03/29/2022 0454  ? CALCIUM 8.6 (L) 03/28/2022 0420  ? GFRNONAA >60 03/30/2022 0516  ? GFRNONAA >60 03/29/2022 0454  ? GFRNONAA >60 03/28/2022 0420  ? GFRNONAA 83 03/15/2015 1542  ? GFRAA 118 03/07/2020 1446  ? GFRAA 115 08/02/2019 1419  ? GFRAA 123 07/13/2019 1218  ? GFRAA >89 03/15/2015 1542  ? ?CBC ?   ?Component Value Date/Time  ? WBC 13.9 (H) 03/30/2022 0516  ? RBC 4.32 03/30/2022 0516  ? HGB 11.9 (L) 03/30/2022 0516  ? HGB 15.5 11/30/2018 1204  ? HCT 37.5 (L) 03/30/2022 0516  ? HCT 45.3 11/30/2018 1204  ? PLT 365 03/30/2022 0516  ? PLT 229 11/30/2018 1204  ? MCV 86.8 03/30/2022 0516  ? MCV 88 11/30/2018 1204  ? MCH 27.5 03/30/2022 0516  ? MCHC 31.7 03/30/2022 0516  ? RDW 14.7 03/30/2022 0516  ? RDW 12.0 (L) 11/30/2018 1204  ? LYMPHSABS 3.0 06/12/2019 1603  ? MONOABS 0.7 06/12/2019 1603  ? EOSABS 0.3 06/12/2019 1603  ? BASOSABS 0.1 06/12/2019 1603  ? ?HEPATIC Function Panel ?Recent Labs  ?  03/28/22 ?0420 03/29/22 ?0454 03/30/22 ?1761  ?PROT 5.7* 6.0* 6.2*  ? ?HEMOGLOBIN A1C ?No components found for: HGA1C,  MPG ?CARDIAC ENZYMES ?Lab Results  ?Component Value Date  ? TROPONINI 0.04 (H) 03/05/2015  ? TROPONINI 0.05 (H) 03/05/2015  ? TROPONINI 0.05 (H) 03/05/2015  ? ?BNP ?No results for input(s): PROBNP in the last 8760 hours. ?TSH ?Recent Labs  ?  03/20/22 ?0732  ?TSH 2.492  ? ?CHOLESTEROL ?Recent Labs  ?  03/15/22 ?0408  ?CHOL 165  ? ? ?Scheduled Meds: ? amiodarone  400 mg Oral BID  ? apixaban  5 mg Oral BID  ? aspirin EC  81 mg Oral Daily  ? atorvastatin  80 mg Oral q1800  ? benzonatate  100 mg Oral BID  ? bisacodyl  10 mg Oral Daily  ? Or  ? bisacodyl  10 mg Rectal Daily  ? Chlorhexidine Gluconate Cloth  6 each Topical Daily  ? Chlorhexidine Gluconate Cloth  6 each Topical Daily  ?  dextromethorphan-guaiFENesin  1 tablet Oral BID  ? digoxin  0.25 mg Oral Daily  ? docusate sodium  200 mg Oral Daily  ? fluticasone furoate-vilanterol  1 puff Inhalation Daily  ? [START ON 03/31/2022] furosemide  40 mg Oral Daily  ? insulin aspart  0-24 Units Subcutaneous TID AC & HS  ? insulin detemir  12 Units Subcutaneous BID  ? lidocaine      ? metoprolol tartrate  25 mg Oral BID  ? Or  ? metoprolol tartrate  25 mg Per Tube BID  ? montelukast  10 mg Per Tube QHS  ? pantoprazole  40 mg Oral Daily  ? potassium chloride  20 mEq Oral BID  ? sodium chloride flush  10-40 mL Intracatheter Q12H  ? sodium chloride flush  10-40 mL Intracatheter Q12H  ? sodium chloride flush  3 mL Intravenous Q12H  ? umeclidinium bromide  1 puff Inhalation Daily  ? ?Continuous Infusions: ? sodium chloride Stopped (03/25/22 0105)  ? sodium chloride    ? sodium chloride 10 mL/hr at 03/27/22 1733  ? lactated ringers    ? lactated ringers Stopped (03/26/22 0600)  ? ?PRN Meds:.sodium chloride, ALPRAZolam, dextrose, hydrOXYzine, metoprolol tartrate, ondansetron (ZOFRAN) IV, oxyCODONE, sodium chloride flush, sodium chloride flush, sodium chloride flush, temazepam, traMADol ? ?Assessment/Plan: ? Unstable angina ?Multivessel CAD ?S/P 2 V CABG, post op day 6 ?HTN ?Morbid obesity ?COPD ?Chronic atrial fibrillation ? ?Plan: ?Continue medical therapy. ?Increase activity as tolerated. ? ? ? LOS: 16 days  ? ?Time spent including chart review, lab review, examination, discussion with patient : 30 min ? ? ?[name]  MD  ?03/30/2022, 4:57 PM ? ? ? ? ?

## 2022-03-31 ENCOUNTER — Inpatient Hospital Stay (HOSPITAL_COMMUNITY): Payer: Medicare (Managed Care)

## 2022-03-31 LAB — CBC
HCT: 38.9 % — ABNORMAL LOW (ref 39.0–52.0)
HCT: 38.7 % — ABNORMAL LOW (ref 39.0–52.0)
Hemoglobin: 12.8 g/dL — ABNORMAL LOW (ref 13.0–17.0)
Hemoglobin: 12.3 g/dL — ABNORMAL LOW (ref 13.0–17.0)
MCH: 28.4 pg (ref 26.0–34.0)
MCH: 27.5 pg (ref 26.0–34.0)
MCHC: 32.9 g/dL (ref 30.0–36.0)
MCHC: 31.8 g/dL (ref 30.0–36.0)
MCV: 86.4 fL (ref 80.0–100.0)
MCV: 86.4 fL (ref 80.0–100.0)
Platelets: 447 10*3/uL — ABNORMAL HIGH (ref 150–400)
Platelets: 442 10*3/uL — ABNORMAL HIGH (ref 150–400)
RBC: 4.5 MIL/uL (ref 4.22–5.81)
RBC: 4.48 MIL/uL (ref 4.22–5.81)
RDW: 14.7 % (ref 11.5–15.5)
RDW: 14.6 % (ref 11.5–15.5)
WBC: 14.4 10*3/uL — ABNORMAL HIGH (ref 4.0–10.5)
WBC: 18.4 10*3/uL — ABNORMAL HIGH (ref 4.0–10.5)
nRBC: 0 % (ref 0.0–0.2)
nRBC: 0 % (ref 0.0–0.2)

## 2022-03-31 LAB — BASIC METABOLIC PANEL
Anion gap: 9 (ref 5–15)
Anion gap: 10 (ref 5–15)
BUN: 12 mg/dL (ref 8–23)
BUN: 16 mg/dL (ref 8–23)
CO2: 29 mmol/L (ref 22–32)
CO2: 28 mmol/L (ref 22–32)
Calcium: 9 mg/dL (ref 8.9–10.3)
Calcium: 9.3 mg/dL (ref 8.9–10.3)
Chloride: 94 mmol/L — ABNORMAL LOW (ref 98–111)
Chloride: 93 mmol/L — ABNORMAL LOW (ref 98–111)
Creatinine, Ser: 0.59 mg/dL — ABNORMAL LOW (ref 0.61–1.24)
Creatinine, Ser: 0.72 mg/dL (ref 0.61–1.24)
GFR, Estimated: >60 mL/min (ref >60)
GFR, Estimated: >60 mL/min (ref >60)
Glucose, Bld: 114 mg/dL — ABNORMAL HIGH (ref 70–99)
Glucose, Bld: 156 mg/dL — ABNORMAL HIGH (ref 70–99)
Potassium: 4.4 mmol/L (ref 3.5–5.1)
Potassium: 4.8 mmol/L (ref 3.5–5.1)
Sodium: 132 mmol/L — ABNORMAL LOW (ref 135–145)
Sodium: 131 mmol/L — ABNORMAL LOW (ref 135–145)

## 2022-03-31 LAB — GLUCOSE, CAPILLARY: Glucose-Capillary: 95 mg/dL (ref 70–99)

## 2022-03-31 MED ORDER — MAGNESIUM HYDROXIDE 400 MG/5ML PO SUSP: 30.0000 mL | Freq: Every day | ORAL | Status: DC | PRN

## 2022-03-31 MED ORDER — SODIUM CHLORIDE 0.9% FLUSH
3.0000 mL | Freq: Two times a day (BID) | INTRAVENOUS | Status: DC
  Administered 2022-03-31 – 2022-04-01 (×3): 3 mL via INTRAVENOUS

## 2022-03-31 MED ORDER — ~~LOC~~ CARDIAC SURGERY, PATIENT & FAMILY EDUCATION: Freq: Once | Status: AC

## 2022-03-31 MED ORDER — ALUM & MAG HYDROXIDE-SIMETH 200-200-20 MG/5ML PO SUSP: 15.0000 mL | Freq: Four times a day (QID) | ORAL | Status: DC | PRN

## 2022-03-31 MED ORDER — SODIUM CHLORIDE 0.9 % IV SOLN: 250.0000 mL | INTRAVENOUS | Status: DC | PRN

## 2022-03-31 MED ORDER — METOPROLOL TARTRATE 12.5 MG HALF TABLET
12.5000 mg | ORAL_TABLET | Freq: Two times a day (BID) | ORAL | Status: DC
  Administered 2022-03-31 – 2022-04-02 (×4): 12.5 mg via ORAL
  Filled 2022-03-31 (×4): qty 1

## 2022-03-31 MED ORDER — SODIUM CHLORIDE 0.9% FLUSH: 3.0000 mL | INTRAVENOUS | Status: DC | PRN

## 2022-03-31 NOTE — Progress Notes (Addendum)
? ?   ?  [address] ?      [place] [address] ?            [phone]   ? ?  ?7 Days Post-Op Procedure(s) (LRB): ?CORONARY ARTERY BYPASS GRAFTING (CABG) TIMES TWO USING LEFT INTERNAL MAMMARY ARTERY AND ENDOSCOPICALLY HARVESTED RIGHT GREATER SAPHENOUS VEIN (N/A) ?TRANSESOPHAGEAL ECHOCARDIOGRAM (TEE) (N/A) ?ENDOVEIN HARVEST OF GREATER SAPHENOUS VEIN (Right) ? ?Subjective: ? ?Patient states he is sore today.  He also states his feet are swollen. ? ?Objective: ?Vital signs in last 24 hours: ?Temp:  [98 ?F (36.7 ?C)-98.4 ?F (36.9 ?C)] 98.1 ?F (36.7 ?C) (04/17 2306) ?Pulse Rate:  [71-115] 102 (04/18 0600) ?Cardiac Rhythm: Atrial fibrillation (04/17 2000) ?Resp:  [12-33] 27 (04/18 0600) ?BP: (87-151)/(52-83) 151/76 (04/18 0600) ?SpO2:  [88 %-98 %] 91 % (04/18 0600) ?Weight:  [143.9 kg] 143.9 kg (04/18 0500) ? ?Intake/Output from previous day: ?04/17 0701 - 04/18 0700 ?In: 740 [P.O.:720; I.V.:20] ?Out: 1175 [Urine:1175] ? ?General appearance: alert, cooperative, and no distress ?Heart: irregularly irregular rhythm ?Lungs: diminished breath sounds bibasilar ?Abdomen: soft, non-tender; bowel sounds normal; no masses,  no organomegaly ?Extremities: edema pitting ?Wound: clean and dry ? ?Lab Results: ?Recent Labs  ?  03/30/22 ?3729 03/31/22 ?0211  ?WBC 13.9* 14.4*  ?HGB 11.9* 12.8*  ?HCT 37.5* 38.9*  ?PLT 365 447*  ? ?BMET:  ?Recent Labs  ?  03/30/22 ?1552 03/31/22 ?0802  ?NA 133* 132*  ?K 4.1 4.4  ?CL 96* 94*  ?CO2 28 29  ?GLUCOSE 116* 114*  ?BUN 14 12  ?CREATININE 0.69 0.59*  ?CALCIUM 9.0 9.0  ?  ?PT/INR: No results for input(s): LABPROT, INR in the last 72 hours. ?ABG ?   ?Component Value Date/Time  ? PHART 7.416 03/25/2022 0556  ? HCO3 25.1 03/25/2022 0556  ? TCO2 26 03/25/2022 0556  ? ACIDBASEDEF 1.0 03/24/2022 2118  ? O2SAT 64.7 03/30/2022 0516  ? ?CBG (last 3)  ?Recent Labs  ?  03/30/22 ?1634 03/30/22 ?2107 03/31/22 ?0702  ?GLUCAP 104* 156* 95  ? ? ?Assessment/Plan: ?S/P Procedure(s) (LRB): ?CORONARY  ARTERY BYPASS GRAFTING (CABG) TIMES TWO USING LEFT INTERNAL MAMMARY ARTERY AND ENDOSCOPICALLY HARVESTED RIGHT GREATER SAPHENOUS VEIN (N/A) ?TRANSESOPHAGEAL ECHOCARDIOGRAM (TEE) (N/A) ?ENDOVEIN HARVEST OF GREATER SAPHENOUS VEIN (Right) ? ?CV- Atrial Fibrillation, rate controlled- on Amiodarone, Lopressor, Digoxin, on Eliquis ?Pulm- [place]  yesterday showed no fluid to be drained, wean oxygen as tolerated, continue IS ?Renal-creatinine WNL, weight is elevated.. Lasix, potassium ordered, will add TED hose ?CBGs controlled, patient is not a diabetic, will d/c SSIP ?Deconditioning- patient will be going home with daughter ?Dispo- patient stable, patient remains in rate controlled Atrial Fibrillation on Eliquis, no pleural effusions to drain via [place], continue diuretics, stop insulin, awaiting transfer to 4E ? ? LOS: 17 days  ?Lowella Dandy, PA-C ?03/31/2022 ? ?CXR is clear ?Plan DC in 1-2 days ?TED hose ? ?patient examined and medical record reviewed,agree with above note. ?Lovett Sox ?03/31/2022 ? ?

## 2022-03-31 NOTE — Progress Notes (Signed)
Ref: Virgilio Belling, PA-C ? ? ?Subjective:  ?Awake. No chest pain.  ?VS stable. ?Discussed Tikosyn v/s amiodarone use with EP. ? ?Objective:  ?Vital Signs in the last 24 hours: ?Temp:  [97.8 ?F (36.6 ?C)-98.4 ?F (36.9 ?C)] 97.8 ?F (36.6 ?C) (04/18 1622) ?Pulse Rate:  [71-102] 77 (04/18 1622) ?Cardiac Rhythm: Atrial flutter;Atrial fibrillation (04/18 1621) ?Resp:  [12-27] 18 (04/18 1622) ?BP: (87-151)/(52-83) 132/68 (04/18 1622) ?SpO2:  [91 %-98 %] 96 % (04/18 1622) ?Weight:  [143.9 kg] 143.9 kg (04/18 0500) ? ?Physical Exam: ?BP Readings from Last 1 Encounters:  ?03/31/22 132/68  ?   ?Wt Readings from Last 1 Encounters:  ?03/31/22 (!) 143.9 kg  ?  Weight change: 1.1 kg Body mass index is 41.86 kg/m?. ?HEENT: [place]/AT, Eyes-Blue, Conjunctiva-Pink, Sclera-Non-icteric ?Neck: No JVD, No bruit, Trachea midline. ?Lungs:  Clearing, Bilateral. ?Cardiac:  Irregular rhythm, normal S1 and S2, no S3. II/VI systolic murmur. ?Abdomen:  Soft, non-tender. BS present. ?Extremities:  1 + edema present. No cyanosis. No clubbing. ?CNS: AxOx3, Cranial nerves grossly intact, moves all 4 extremities.  ?Skin: Warm and dry. ? ? ?Intake/Output from previous day: ?04/17 0701 - 04/18 0700 ?In: 740 [P.O.:720; I.V.:20] ?Out: 1175 [Urine:1175] ? ? ? ?Lab Results: ?BMET ?   ?Component Value Date/Time  ? NA 132 (L) 03/31/2022 4818  ? NA 133 (L) 03/30/2022 0516  ? NA 130 (L) 03/29/2022 0454  ? NA 140 03/07/2020 1446  ? NA 140 08/02/2019 1419  ? NA 140 07/13/2019 1218  ? K 4.4 03/31/2022 0653  ? K 4.1 03/30/2022 0516  ? K 3.7 03/29/2022 0454  ? CL 94 (L) 03/31/2022 0653  ? CL 96 (L) 03/30/2022 0516  ? CL 93 (L) 03/29/2022 0454  ? CO2 29 03/31/2022 0653  ? CO2 28 03/30/2022 0516  ? CO2 30 03/29/2022 0454  ? GLUCOSE 114 (H) 03/31/2022 5631  ? GLUCOSE 116 (H) 03/30/2022 0516  ? GLUCOSE 134 (H) 03/29/2022 0454  ? BUN 12 03/31/2022 0653  ? BUN 14 03/30/2022 0516  ? BUN 11 03/29/2022 0454  ? BUN 19 03/07/2020 1446  ? BUN 15 08/02/2019 1419  ? BUN 14  07/13/2019 1218  ? CREATININE 0.59 (L) 03/31/2022 4970  ? CREATININE 0.69 03/30/2022 0516  ? CREATININE 0.65 03/29/2022 0454  ? CREATININE 1.08 04/23/2015 1443  ? CREATININE 1.00 03/15/2015 1542  ? CALCIUM 9.0 03/31/2022 0653  ? CALCIUM 9.0 03/30/2022 0516  ? CALCIUM 8.6 (L) 03/29/2022 0454  ? GFRNONAA >60 03/31/2022 0653  ? GFRNONAA >60 03/30/2022 0516  ? GFRNONAA >60 03/29/2022 0454  ? GFRNONAA 83 03/15/2015 1542  ? GFRAA 118 03/07/2020 1446  ? GFRAA 115 08/02/2019 1419  ? GFRAA 123 07/13/2019 1218  ? GFRAA >89 03/15/2015 1542  ? ?CBC ?   ?Component Value Date/Time  ? WBC 14.4 (H) 03/31/2022 2637  ? RBC 4.50 03/31/2022 0653  ? HGB 12.8 (L) 03/31/2022 8588  ? HGB 15.5 11/30/2018 1204  ? HCT 38.9 (L) 03/31/2022 5027  ? HCT 45.3 11/30/2018 1204  ? PLT 447 (H) 03/31/2022 7412  ? PLT 229 11/30/2018 1204  ? MCV 86.4 03/31/2022 0653  ? MCV 88 11/30/2018 1204  ? MCH 28.4 03/31/2022 0653  ? MCHC 32.9 03/31/2022 0653  ? RDW 14.7 03/31/2022 0653  ? RDW 12.0 (L) 11/30/2018 1204  ? LYMPHSABS 3.0 06/12/2019 1603  ? MONOABS 0.7 06/12/2019 1603  ? EOSABS 0.3 06/12/2019 1603  ? BASOSABS 0.1 06/12/2019 1603  ? ?HEPATIC Function Panel ?Recent  Labs  ?  03/28/22 ?0420 03/29/22 ?0454 03/30/22 ?5621  ?PROT 5.7* 6.0* 6.2*  ? ?HEMOGLOBIN A1C ?No components found for: HGA1C,  MPG ?CARDIAC ENZYMES ?Lab Results  ?Component Value Date  ? TROPONINI 0.04 (H) 03/05/2015  ? TROPONINI 0.05 (H) 03/05/2015  ? TROPONINI 0.05 (H) 03/05/2015  ? ?BNP ?No results for input(s): PROBNP in the last 8760 hours. ?TSH ?Recent Labs  ?  03/20/22 ?0732  ?TSH 2.492  ? ?CHOLESTEROL ?Recent Labs  ?  03/15/22 ?0408  ?CHOL 165  ? ? ?Scheduled Meds: ? amiodarone  400 mg Oral BID  ? apixaban  5 mg Oral BID  ? aspirin EC  81 mg Oral Daily  ? atorvastatin  80 mg Oral q1800  ? benzonatate  100 mg Oral BID  ? bisacodyl  10 mg Oral Daily  ? Or  ? bisacodyl  10 mg Rectal Daily  ? Chlorhexidine Gluconate Cloth  6 each Topical Daily  ? [facility], Patient &  Family Education   Does not apply Once  ? dextromethorphan-guaiFENesin  1 tablet Oral BID  ? digoxin  0.25 mg Oral Daily  ? docusate sodium  200 mg Oral Daily  ? fluticasone furoate-vilanterol  1 puff Inhalation Daily  ? furosemide  40 mg Oral Daily  ? metoprolol tartrate  12.5 mg Oral BID  ? montelukast  10 mg Oral QHS  ? pantoprazole  40 mg Oral Daily  ? potassium chloride  20 mEq Oral BID  ? sodium chloride flush  10-40 mL Intracatheter Q12H  ? sodium chloride flush  3 mL Intravenous Q12H  ? umeclidinium bromide  1 puff Inhalation Daily  ? ?Continuous Infusions: ? sodium chloride    ? ?PRN Meds:.sodium chloride, ALPRAZolam, alum & mag hydroxide-simeth, hydrOXYzine, magnesium hydroxide, metoprolol tartrate, ondansetron (ZOFRAN) IV, oxyCODONE, sodium chloride flush, sodium chloride flush, temazepam ? ?Assessment/Plan: ? Unstable angina ?Multivessel CAD ?S/P 2 V CABG, post op day 7 ?HTN ?Morbid obesity ?COPD ?Chronic atrial fibrillation ? ?Plan: ?Continue amiodarone and Eliquis for 3-4 weeks. Then external cardioversion, then off amiodarone and then Tikosyn post 1 week of no amiodarone. ? ? LOS: 17 days  ? ?Time spent including chart review, lab review, examination, discussion with patient : 30 min ? ? ?[name]  MD  ?03/31/2022, 4:55 PM ? ? ? ? ?

## 2022-03-31 NOTE — Progress Notes (Signed)
CARDIAC REHAB PHASE I  ? ?PRE:  Rate/Rhythm: up with RN ? ?  BP: sitting  ? ?  SaO2:  ? ?MODE:  Ambulation: 740 ft  ? ?POST:  Rate/Rhythm: 100 afib ? ?  BP: sitting 143/86  ? ?  SaO2: 89 RA ->96 RA ? ?Pt up ambulating hall independently with RN, I took over and finished walk with him. To recliner. Elevated feet as he kept them down all day yesterday. Encouraged IS as pt sts he is more SOB with walking today. VSS.   ?[id number] ? ?Ethelda Chick CES, ACSM ?03/31/2022 ?11:21 AM ? ? ? ? ?

## 2022-04-01 LAB — BASIC METABOLIC PANEL
Anion gap: 6 (ref 5–15)
BUN: 11 mg/dL (ref 8–23)
CO2: 31 mmol/L (ref 22–32)
Calcium: 8.9 mg/dL (ref 8.9–10.3)
Chloride: 95 mmol/L — ABNORMAL LOW (ref 98–111)
Creatinine, Ser: 0.61 mg/dL (ref 0.61–1.24)
GFR, Estimated: >60 mL/min (ref >60)
Glucose, Bld: 116 mg/dL — ABNORMAL HIGH (ref 70–99)
Potassium: 4.5 mmol/L (ref 3.5–5.1)
Sodium: 132 mmol/L — ABNORMAL LOW (ref 135–145)

## 2022-04-01 LAB — CBC
HCT: 35 % — ABNORMAL LOW (ref 39.0–52.0)
Hemoglobin: 11.7 g/dL — ABNORMAL LOW (ref 13.0–17.0)
MCH: 28.4 pg (ref 26.0–34.0)
MCHC: 33.4 g/dL (ref 30.0–36.0)
MCV: 85 fL (ref 80.0–100.0)
Platelets: 375 10*3/uL (ref 150–400)
RBC: 4.12 MIL/uL — ABNORMAL LOW (ref 4.22–5.81)
RDW: 14.6 % (ref 11.5–15.5)
WBC: 14.9 10*3/uL — ABNORMAL HIGH (ref 4.0–10.5)
nRBC: 0 % (ref 0.0–0.2)

## 2022-04-01 LAB — DIGOXIN LEVEL: Digoxin Level: 0.8 ng/mL (ref 0.8–2.0)

## 2022-04-01 MED ORDER — FUROSEMIDE 40 MG PO TABS
40.0000 mg | ORAL_TABLET | Freq: Two times a day (BID) | ORAL | Status: DC
  Administered 2022-04-01 – 2022-04-02 (×2): 40 mg via ORAL
  Filled 2022-04-01 (×2): qty 1

## 2022-04-01 NOTE — Progress Notes (Signed)
Occupational Therapy Treatment ?Patient Details ?Name: Arthur Church ?MRN: [id number] ?DOB: 09/14/1956 ?Today's Date: 04/01/2022 ? ? ?History of present illness 66 y.o. M who presents with chest pain. Admitted with acute coronary syndrome and NSTEMI  now s/p CABG x 2 4/11. Significant PMH: obesity, PAF, COPD, DM2. ?  ?OT comments ? Pt progressed from chair to bed transfer mod I. Pt educated on LB dressing with AE and plans to purchase SOCk aide. Recommendation for HHOT.   ? ?Recommendations for follow up therapy are one component of a multi-disciplinary discharge planning process, led by the attending physician.  Recommendations may be updated based on patient status, additional functional criteria and insurance authorization. ?   ?Follow Up Recommendations ? Home health OT  ?  ?Assistance Recommended at Discharge Frequent or constant Supervision/Assistance  ?Patient can return home with the following ? A little help with walking and/or transfers ?  ?Equipment Recommendations ? Tub/shower seat;BSC/3in1  ?  ?Recommendations for Other Services   ? ?  ?Precautions / Restrictions Precautions ?Precautions: Sternal;Fall;Other (comment) ?Precaution Booklet Issued: No ?Precaution Comments: watch SpO2 ?Restrictions ?Weight Bearing Restrictions: Yes ?Other Position/Activity Restrictions: sternal precautions  ? ? ?  ? ?Mobility Bed Mobility ?Overal bed mobility: Needs Assistance ?Bed Mobility: Sit to Supine ?  ?  ?  ?Sit to supine: Supervision ?  ?General bed mobility comments: pt able to sequence correctly ?  ? ?Transfers ?Overall transfer level: Modified independent ?  ?  ?  ?  ?  ?  ?  ?  ?  ?  ?  ?Balance   ?  ?  ?  ?  ?  ?  ?  ?  ?  ?  ?  ?  ?  ?  ?  ?  ?  ?  ?   ? ?ADL either performed or assessed with clinical judgement  ? ?ADL Overall ADL's : Needs assistance/impaired ?  ?  ?  ?  ?  ?  ?  ?  ?  ?  ?Lower Body Dressing: Supervision/safety;Sit to/from stand;With adaptive equipment ?Lower Body Dressing Details (indicate cue  type and reason): pt educated on sock aid and plans to purchase off [employer]. pt able to loop underwear over feet with hip flexion. pt advised on reacher but reports "i prefer to do it this way" Pt states "i do like that sock thing. i want that" ?Toilet Transfer: Supervision/safety ?  ?  ?  ?  ?  ?Functional mobility during ADLs: Supervision/safety ?General ADL Comments: pt transfer from chair to bed per RN request to lay supine to remove suture ?  ? ?Extremity/Trunk Assessment   ?  ?  ?  ?  ?  ? ?Vision   ?  ?  ?Perception   ?  ?Praxis   ?  ? ?Cognition Arousal/Alertness: Awake/alert ?Behavior During Therapy: [facility] for tasks assessed/performed ?Overall Cognitive Status: Within Functional Limits for tasks assessed ?  ?  ?  ?  ?  ?  ?  ?  ?  ?  ?  ?  ?  ?  ?  ?  ?  ?  ?  ?   ?Exercises   ? ?  ?Shoulder Instructions   ? ? ?  ?General Comments RA at this time VSS  ? ? ?Pertinent Vitals/ Pain       Pain Assessment ?Pain Assessment: No/denies pain ? ?Home Living   ?  ?  ?  ?  ?  ?  ?  ?  ?  ?  ?  ?  ?  ?  ?  ?  ?  ?  ? ?  ?  Prior Functioning/Environment    ?  ?  ?  ?   ? ?Frequency ? Min 2X/week  ? ? ? ? ?  ?Progress Toward Goals ? ?OT Goals(current goals can now be found in the care plan section) ? Progress towards OT goals: Progressing toward goals ? ?Acute Rehab OT Goals ?Patient Stated Goal: to return home ?OT Goal Formulation: With patient ?Time For Goal Achievement: 04/09/22 ?Potential to Achieve Goals: Good ?ADL Goals ?Pt Will Perform Upper Body Dressing: with set-up;with supervision;sitting ?Pt Will Perform Lower Body Dressing: with min guard assist;sit to/from stand;with adaptive equipment ?Pt Will Transfer to Toilet: with min guard assist;ambulating;bedside commode ?Pt Will Perform Toileting - Clothing Manipulation and hygiene: with min guard assist;sit to/from stand;sitting/lateral leans  ?Plan Discharge plan remains appropriate   ? ?Co-evaluation ? ? ?   ?  ?  ?  ?  ? ?  ?AM-PAC OT "6 Clicks" Daily Activity      ?Outcome Measure ? ? Help from another person eating meals?: None ?Help from another person taking care of personal grooming?: None ?Help from another person toileting, which includes using toliet, bedpan, or urinal?: A Little ?Help from another person bathing (including washing, rinsing, drying)?: A Little ?Help from another person to put on and taking off regular upper body clothing?: None ?Help from another person to put on and taking off regular lower body clothing?: A Little ?6 Click Score: 21 ? ?  ?End of Session   ? ?OT Visit Diagnosis: Unsteadiness on feet (R26.81);Other abnormalities of gait and mobility (R26.89);Muscle weakness (generalized) (M62.81) ?  ?Activity Tolerance Patient tolerated treatment well ?  ?Patient Left in bed;with call bell/phone within reach;Other (comment) (CNA taking vitals at the end of session) ?  ?Nurse Communication Mobility status;Precautions ?  ? ?   ? ?Time: 5638-9373 ?OT Time Calculation (min): 18 min ? ?Charges: OT General Charges ?$OT Visit: 1 Visit ?OT Treatments ?$Self Care/Home Management : 8-22 mins ? ? ?Arthur Church, OTR/L  ?Acute [facility] Services ?Pager: [phone] ?Office: [phone] ?. ? ? ?Arthur Church ?04/01/2022, 1:48 PM ?

## 2022-04-01 NOTE — TOC Initial Note (Addendum)
Transition of Care (TOC) - Initial/Assessment Note  ?Donn Pierini Charity fundraiser, BSN ?Transitions of Care ?Unit 4E- RN Case Manager ?See Treatment Team for direct phone #  ? ? ?Patient Details  ?Name: Arthur Church ?MRN: [id number] ?Date of Birth: 05-24-1956 ? ?Transition of Care (TOC) CM/SW Contact:    ?Zenda Alpers, Lenn Sink, RN ?Phone Number: ?04/01/2022, 11:39 AM ? ?Clinical Narrative:                 ?Pt s/p CABG, from home. Orders placed for DME and HH needs.  ?CM in to speak with pt at bedside to discuss transition needs. List provided for choice Per CMS guidelines from medicare.gov website with star ratings (copy placed in shadow chart) - per pt he does not have a preference as long as agency takes his [place] plan and defers to this Clinical research associate to secure agency on his behalf.  ?Pt reports he does not feel he needs a RW at this time and declines DME-RW for discharge. Pt states he has a shower chair at home as well as cpap and home 02 w/ [employer].  ? ?Pt reports his daughter will be taking him home. He will be staying at ?[address] [place], [place] [address] ?Pt's cell- [phone] ? ?Pt's address, and PCP verified in epic.  ? ?Call made to [facility] with [facility] for HHPTOT referral-  referral has been accepted- they will f/u to schedule post discharge ? ?Expected Discharge Plan: Home w Home Health Services ?Barriers to Discharge: Continued Medical Work up ? ? ?Patient Goals and CMS Choice ?Patient states their goals for this hospitalization and ongoing recovery are:: return home ?CMS Medicare.gov Compare Post Acute Care list provided to:: Patient ?Choice offered to / list presented to : Patient ? ?Expected Discharge Plan and Services ?Expected Discharge Plan: Home w Home Health Services ?  ?Discharge Planning Services: CM Consult ?Post Acute Care Choice: Durable Medical Equipment, Home Health ?Living arrangements for the past 2 months: Single Family Home ?                ?DME Arranged: Walker rolling, Patient refused  services ?DME Agency: NA ?  ?  ?  ?HH Arranged: PT, OT ?HH Agency: [facility] ?Date HH Agency Contacted: 04/01/22 ?Time HH Agency Contacted: 1138 ?Representative spoke with at [facility] Agency: Victorino Dike ? ?Prior Living Arrangements/Services ?Living arrangements for the past 2 months: Single Family Home ?Lives with:: Self ?Patient language and need for interpreter reviewed:: Yes ?Do you feel safe going back to the place where you live?: Yes      ?Need for Family Participation in Patient Care: Yes (Comment) ?Care giver support system in place?: Yes (comment) ?Current home services: DME (shower chair, cpap, home 02 ([employer])) ?Criminal Activity/Legal Involvement Pertinent to Current Situation/Hospitalization: No - Comment as needed ? ?Activities of Daily Living ?Home Assistive Devices/Equipment: None ?ADL Screening (condition at time of admission) ?Patient's cognitive ability adequate to safely complete daily activities?: Yes ?Is the patient deaf or have difficulty hearing?: No ?Does the patient have difficulty seeing, even when wearing glasses/contacts?: No ?Does the patient have difficulty concentrating, remembering, or making decisions?: No ?Patient able to express need for assistance with ADLs?: Yes ?Does the patient have difficulty dressing or bathing?: No ?Independently performs ADLs?: Yes (appropriate for developmental age) ?Does the patient have difficulty walking or climbing stairs?: Yes ?Weakness of Legs: None ?Weakness of Arms/Hands: None ? ?Permission Sought/Granted ?Permission sought to share information with : Magazine features editor ?Permission granted to  share information with : Yes, Verbal Permission Granted ?   ? Permission granted to share info w AGENCY: HH ?   ?   ? ?Emotional Assessment ?Appearance:: Appears stated age ?Attitude/Demeanor/Rapport: Engaged ?Affect (typically observed): Accepting, Appropriate, Pleasant ?Orientation: : Oriented to Self, Oriented to Place, Oriented to  Time,  Oriented to Situation ?Alcohol / Substance Use: Not Applicable ?Psych Involvement: No (comment) ? ?Admission diagnosis:  Acute coronary syndrome (HCC) [I24.9] ?NSTEMI (non-ST elevated myocardial infarction) (HCC) [I21.4] ?S/P CABG x 2 [Z95.1] ?Patient Active Problem List  ? Diagnosis Date Noted  ? 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  ? NSTEMI (non-ST elevated myocardial infarction) (HCC)   ? Acute coronary syndrome (HCC) 03/14/2022  ? COPD with chronic bronchitis and emphysema (HCC) 08/31/2019  ? Shortness of breath 07/17/2019  ? Medication management 07/17/2019  ? Radiculitis of left cervical region 04/03/2019  ? Diabetes mellitus type 2, diet-controlled (HCC) 04/03/2019  ? LPRD (laryngopharyngeal reflux disease) 02/03/2019  ? Grieving 04/18/2018  ? 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/2017  ? Annual physical exam 05/17/2017  ? Right foot pain 05/17/2017  ? Chronic respiratory failure with hypoxia (HCC) 05/17/2017  ? Varicose veins 09/06/2015  ? Essential hypertension 06/28/2015  ? PAF (paroxysmal atrial fibrillation) (HCC) 05/03/2015  ? Non-ischemic cardiomyopathy (HCC) 05/03/2015  ? Rosacea 04/03/2015  ? Psoriasis 04/03/2015  ? Morbid obesity (HCC) 03/05/2015  ? ETOH abuse 03/05/2015  ? ?PCP:  Virgilio Belling, PA-C ?Pharmacy:   ?[id number] [place], [place] - [address] AT [facility] [address] ?[address] ?[place] [address] ?Phone: [phone] Fax: [phone] ? ?[id number] - [place], [place] - [address] ?[address] ?[place] [address] ?Phone: [phone] Fax: [phone] ? ?[facility] ?[address] ?[place] [address] ?Phone: [phone] Fax: [phone] ? ? ? ? ?Social Determinants of Health (SDOH) Interventions ?  ? ?Readmission Risk Interventions ? ?  04/01/2022  ? 11:39 AM  ?Readmission Risk Prevention Plan  ?Transportation Screening Complete  ?PCP or  Specialist Appt within 5-7 Days Complete  ?Home Care Screening Complete  ?Medication Review (RN CM) Complete  ? ? ? ?

## 2022-04-01 NOTE — Progress Notes (Signed)
Physical Therapy Treatment and Discharge ?Patient Details ?Name: Arthur Church ?MRN: [id number] ?DOB: 01-31-1956 ?Today's Date: 04/01/2022 ? ? ?History of Present Illness 66 y.o. M who presents with chest pain. Admitted with acute coronary syndrome and NSTEMI  now s/p CABG x 2 4/11. Significant PMH: obesity, PAF, COPD, DM2. ? ?  ?PT Comments  ? ? Pt met his physical therapy goals during his inpatient stay. Ambulating 850 ft with no assistive device and negotiated 4 steps with bilateral railings without physical assist. HR 88-120, SpO2 94% on RA. Continues with decreased cardiopulmonary endurance and would benefit from outpatient cardiac rehab to address. No further acute PT needs.  ?   ?Recommendations for follow up therapy are one component of a multi-disciplinary discharge planning process, led by the attending physician.  Recommendations may be updated based on patient status, additional functional criteria and insurance authorization. ? ?Follow Up Recommendations ? Other (comment) (OP Cardiac Rehab) ?  ?  ?Assistance Recommended at Discharge PRN  ?Patient can return home with the following A little help with walking and/or transfers;A little help with bathing/dressing/bathroom;Assistance with cooking/housework;Assist for transportation;Help with stairs or ramp for entrance ?  ?Equipment Recommendations ? None recommended by PT  ?  ?Recommendations for Other Services   ? ? ?  ?Precautions / Restrictions Precautions ?Precautions: Sternal;Fall;Other (comment) ?Precaution Booklet Issued: No ?Precaution Comments: watch SpO2 ?Restrictions ?Weight Bearing Restrictions: Yes ?Other Position/Activity Restrictions: sternal precautions  ?  ? ?Mobility ? Bed Mobility ?  ?  ?  ?  ?  ?  ?  ?General bed mobility comments: Pt up in chair upon arrival. ?  ? ?Transfers ?Overall transfer level: Modified independent ?Equipment used: None ?  ?  ?  ?  ?  ?  ?  ?General transfer comment: Use of momentum to power up from chair, no  physical assist required ?  ? ?Ambulation/Gait ?Ambulation/Gait assistance: Modified independent (Device/Increase time) ?Gait Distance (Feet): 850 Feet ?Assistive device: None ?Gait Pattern/deviations: Step-through pattern, Decreased stride length, Wide base of support ?Gait velocity: decreased ?  ?  ?General Gait Details: Wider BOS, no gross imbalance, modI ? ? ?Stairs ?Stairs: Yes ?Stairs assistance: Supervision ?Stair Management: Two rails ?Number of Stairs: 4 ?General stair comments: cues for step by step ? ? ?Wheelchair Mobility ?  ? ?Modified Rankin (Stroke Patients Only) ?  ? ? ?  ?Balance Overall balance assessment: Needs assistance ?Sitting-balance support: No upper extremity supported, Feet supported ?Sitting balance-Leahy Scale: Good ?  ?  ?Standing balance support: No upper extremity supported, During functional activity ?Standing balance-Leahy Scale: Good ?  ?  ?  ?  ?  ?  ?  ?  ?  ?  ?  ?  ?  ? ?  ?Cognition Arousal/Alertness: Awake/alert ?Behavior During Therapy: [facility] for tasks assessed/performed ?Overall Cognitive Status: Within Functional Limits for tasks assessed ?  ?  ?  ?  ?  ?  ?  ?  ?  ?  ?  ?  ?  ?  ?  ?  ?  ?  ?  ? ?  ?Exercises General Exercises - Lower Extremity ?Long Arc Quad: Both, Seated, 15 reps ?Hip ABduction/ADduction: Both, 10 reps, Standing ?Hip Flexion/Marching: Both, 10 reps, Seated ?Heel Raises: 5 reps, 15 reps, Standing ?Other Exercises ?Other Exercises: Seated: scapular retractions 3 s hold x 15 ?Other Exercises: Standing: bilat hamstring curls x 10 each ? ?  ?General Comments   ?  ?  ? ?Pertinent Vitals/Pain Pain Assessment ?  Pain Assessment: 0-10 ?Pain Score: 2  ?Pain Location: sternum ?Pain Descriptors / Indicators: Discomfort, Operative site guarding ?Pain Intervention(s): Monitored during session  ? ? ?Home Living   ?  ?  ?  ?  ?  ?  ?  ?  ?  ?   ?  ?Prior Function    ?  ?  ?   ? ?PT Goals (current goals can now be found in the care plan section) Acute Rehab PT  Goals ?Patient Stated Goal: to walk ?PT Goal Formulation: With patient/family ?Time For Goal Achievement: 04/08/22 ?Potential to Achieve Goals: Good ?Progress towards PT goals: Goals met/education completed, patient discharged from PT ? ?  ?Frequency ? ? ? Min 3X/week ? ? ? ?  ?PT Plan Discharge plan needs to be updated  ? ? ?Co-evaluation   ?  ?  ?  ?  ? ?  ?AM-PAC PT "6 Clicks" Mobility   ?Outcome Measure ? Help needed turning from your back to your side while in a flat bed without using bedrails?: None ?Help needed moving from lying on your back to sitting on the side of a flat bed without using bedrails?: None ?Help needed moving to and from a bed to a chair (including a wheelchair)?: None ?Help needed standing up from a chair using your arms (e.g., wheelchair or bedside chair)?: None ?Help needed to walk in hospital room?: None ?Help needed climbing 3-5 steps with a railing? : A Little ?6 Click Score: 23 ? ?  ?End of Session   ?Activity Tolerance: Patient tolerated treatment well ?Patient left: in chair;with call bell/phone within reach;with family/visitor present ?Nurse Communication: Mobility status ?PT Visit Diagnosis: Unsteadiness on feet (R26.81);Difficulty in walking, not elsewhere classified (R26.2);Pain;Other abnormalities of gait and mobility (R26.89) ?Pain - part of body:  (sternum) ?  ? ? ?Time: 1791-5056 ?PT Time Calculation (min) (ACUTE ONLY): 31 min ? ?Charges:  $Therapeutic Activity: 23-37 mins          ?          ? ?Wyona Almas, PT, DPT ?Acute Rehabilitation Services ?Pager [phone] ?Office [phone] ? ? ? ?Carloine Margo Aye ?04/01/2022, 11:21 AM ? ?

## 2022-04-01 NOTE — Progress Notes (Addendum)
? ?   ?  [address] ?      [place] [address] ?            [phone]   ? ?  ?8 Days Post-Op Procedure(s) (LRB): ?CORONARY ARTERY BYPASS GRAFTING (CABG) TIMES TWO USING LEFT INTERNAL MAMMARY ARTERY AND ENDOSCOPICALLY HARVESTED RIGHT GREATER SAPHENOUS VEIN (N/A) ?TRANSESOPHAGEAL ECHOCARDIOGRAM (TEE) (N/A) ?ENDOVEIN HARVEST OF GREATER SAPHENOUS VEIN (Right) ? ?Subjective: ? ?States he feels so/so, having a slow start this morning. He is requesting a bath this morning stating this hasn't been done in a few days. ? ?Objective: ?Vital signs in last 24 hours: ?Temp:  [97.7 ?F (36.5 ?C)-98.1 ?F (36.7 ?C)] 97.9 ?F (36.6 ?C) (04/19 0426) ?Pulse Rate:  [74-99] 74 (04/19 0426) ?Cardiac Rhythm: Atrial fibrillation (04/19 0350) ?Resp:  [14-20] 14 (04/19 0426) ?BP: (108-135)/(58-76) 113/58 (04/19 0426) ?SpO2:  [93 %-96 %] 96 % (04/19 0426) ?Weight:  [143.3 kg] 143.3 kg (04/19 6568) ? ?Intake/Output from previous day: ?04/18 0701 - 04/19 0700 ?In: 690 [P.O.:690] ?Out: 2280 [Urine:2280] ? ?General appearance: alert, cooperative, and no distress ?Heart: irregularly irregular rhythm ?Lungs: clear to auscultation bilaterally ?Abdomen: soft, non-tender; bowel sounds normal; no masses,  no organomegaly ?Extremities: edema trace ?Wound: clean and dry ? ?Lab Results: ?Recent Labs  ?  03/31/22 ?1838 04/01/22 ?0441  ?WBC 18.4* 14.9*  ?HGB 12.3* 11.7*  ?HCT 38.7* 35.0*  ?PLT 442* 375  ? ?BMET:  ?Recent Labs  ?  03/31/22 ?1811 04/01/22 ?0441  ?NA 131* 132*  ?K 4.8 4.5  ?CL 93* 95*  ?CO2 28 31  ?GLUCOSE 156* 116*  ?BUN 16 11  ?CREATININE 0.72 0.61  ?CALCIUM 9.3 8.9  ?  ?PT/INR: No results for input(s): LABPROT, INR in the last 72 hours. ?ABG ?   ?Component Value Date/Time  ? PHART 7.416 03/25/2022 0556  ? HCO3 25.1 03/25/2022 0556  ? TCO2 26 03/25/2022 0556  ? ACIDBASEDEF 1.0 03/24/2022 2118  ? O2SAT 64.7 03/30/2022 0516  ? ?CBG (last 3)  ?Recent Labs  ?  03/30/22 ?1634 03/30/22 ?2107 03/31/22 ?0702  ?GLUCAP 104* 156* 95   ? ? ?Assessment/Plan: ?S/P Procedure(s) (LRB): ?CORONARY ARTERY BYPASS GRAFTING (CABG) TIMES TWO USING LEFT INTERNAL MAMMARY ARTERY AND ENDOSCOPICALLY HARVESTED RIGHT GREATER SAPHENOUS VEIN (N/A) ?TRANSESOPHAGEAL ECHOCARDIOGRAM (TEE) (N/A) ?ENDOVEIN HARVEST OF GREATER SAPHENOUS VEIN (Right) ? ?CV- rate controlled Atrial Fibrillation, BP is controlled- continue Lopressor, Digoxin, and Eliquis ?Pulm- off oxygen, no acute issues, continue IS ?Renal- creatinine stable at 0.61, K is at 4.5, weight is trending down, continue Lasix, potassium ?Deconditioning- daughter assisting at home, home DME ordered ?Dispo- patient stable, making progress, HR remains controlled, I was able to place TED hose on patient this morning, will work on ambulation today, plan for d/c in AM ? ? LOS: 18 days  ? ? ?Lowella Dandy, PA-C ?04/01/2022 ? ? ?  PA/Lat CXR shows no sig L pleural effusion, clear lungs ? Increase lasix to 40 mg bid [home dose] for leg swelling ?  He understands not to take his preopTikosyn while he will be on po amiodarone post op- will continue for chemical cardioversion. ?   His daughter will take him home tomorrow ?   The patient has home oxygen in place, he was using 2 L nasal cannula every night at home before his admission ? ?patient examined and medical record reviewed,agree with above note. ?Lovett Sox ?04/01/2022 ? ?

## 2022-04-01 NOTE — Progress Notes (Signed)
Ref: Andria Frames, PA-C ? ? ?Subjective:  ?Awake. VS stable. ?A. Fib with controlled ventricular response continues. ? ?Objective:  ?Vital Signs in the last 24 hours: ?Temp:  [97.7 ?F (36.5 ?C)-98.3 ?F (36.8 ?C)] 98.3 ?F (36.8 ?C) (04/19 1929) ?Pulse Rate:  [71-87] 80 (04/19 1929) ?Cardiac Rhythm: Atrial fibrillation (04/19 1950) ?Resp:  [14-20] 18 (04/19 1929) ?BP: (110-133)/(58-90) 119/82 (04/19 1929) ?SpO2:  [94 %-98 %] 98 % (04/19 1929) ?Weight:  [143.3 kg] 143.3 kg (04/19 [id number]) ? ?Physical Exam: ?BP Readings from Last 1 Encounters:  ?04/01/22 119/82  ?   ?Wt Readings from Last 1 Encounters:  ?04/01/22 (!) 143.3 kg  ?  Weight change: -0.6 kg Body mass index is 41.68 kg/m?. ?HEENT: [place]/AT, Eyes-Blue, Conjunctiva-Pink, Sclera-Non-icteric ?Neck: No JVD, No bruit, Trachea midline. ?Lungs:  Clearing, Bilateral. ?Cardiac:  Irregular rhythm, normal S1 and S2, no S3. II/VI systolic murmur. ?Abdomen:  Soft, non-tender. BS present. ?Extremities:  1 + edema present. No cyanosis. No clubbing. ?CNS: AxOx3, Cranial nerves grossly intact, moves all 4 extremities.  ?Skin: Warm and dry. ? ? ?Intake/Output from previous day: ?04/18 0701 - 04/19 0700 ?In: 690 [P.O.:690] ?Out: 2280 [Urine:2280] ? ? ? ?Lab Results: ?BMET ?   ?Component Value Date/Time  ? NA 132 (L) 04/01/2022 0441  ? NA 131 (L) 03/31/2022 1811  ? NA 132 (L) 03/31/2022 [id number]  ? NA 140 03/07/2020 1446  ? NA 140 08/02/2019 1419  ? NA 140 07/13/2019 1218  ? K 4.5 04/01/2022 0441  ? K 4.8 03/31/2022 1811  ? K 4.4 03/31/2022 0653  ? CL 95 (L) 04/01/2022 0441  ? CL 93 (L) 03/31/2022 1811  ? CL 94 (L) 03/31/2022 0653  ? CO2 31 04/01/2022 0441  ? CO2 28 03/31/2022 1811  ? CO2 29 03/31/2022 0653  ? GLUCOSE 116 (H) 04/01/2022 0441  ? GLUCOSE 156 (H) 03/31/2022 1811  ? GLUCOSE 114 (H) 03/31/2022 [id number]  ? BUN 11 04/01/2022 0441  ? BUN 16 03/31/2022 1811  ? BUN 12 03/31/2022 0653  ? BUN 19 03/07/2020 1446  ? BUN 15 08/02/2019 1419  ? BUN 14 07/13/2019 1218  ? CREATININE 0.61  04/01/2022 0441  ? CREATININE 0.72 03/31/2022 1811  ? CREATININE 0.59 (L) 03/31/2022 [id number]  ? CREATININE 1.08 04/23/2015 1443  ? CREATININE 1.00 03/15/2015 1542  ? CALCIUM 8.9 04/01/2022 0441  ? CALCIUM 9.3 03/31/2022 1811  ? CALCIUM 9.0 03/31/2022 0653  ? GFRNONAA >60 04/01/2022 0441  ? GFRNONAA >60 03/31/2022 1811  ? GFRNONAA >60 03/31/2022 0653  ? GFRNONAA 83 03/15/2015 1542  ? GFRAA 118 03/07/2020 1446  ? GFRAA 115 08/02/2019 1419  ? GFRAA 123 07/13/2019 1218  ? GFRAA >89 03/15/2015 1542  ? ?CBC ?   ?Component Value Date/Time  ? WBC 14.9 (H) 04/01/2022 0441  ? RBC 4.12 (L) 04/01/2022 0441  ? HGB 11.7 (L) 04/01/2022 0441  ? HGB 15.5 11/30/2018 1204  ? HCT 35.0 (L) 04/01/2022 0441  ? HCT 45.3 11/30/2018 1204  ? PLT 375 04/01/2022 0441  ? PLT 229 11/30/2018 1204  ? MCV 85.0 04/01/2022 0441  ? MCV 88 11/30/2018 1204  ? MCH 28.4 04/01/2022 0441  ? MCHC 33.4 04/01/2022 0441  ? RDW 14.6 04/01/2022 0441  ? RDW 12.0 (L) 11/30/2018 1204  ? LYMPHSABS 3.0 06/12/2019 1603  ? MONOABS 0.7 06/12/2019 1603  ? EOSABS 0.3 06/12/2019 1603  ? BASOSABS 0.1 06/12/2019 1603  ? ?HEPATIC Function Panel ?Recent Labs  ?  03/28/22 ?0420 03/29/22 ?  [id number] 03/30/22 ?[id number]  ?PROT 5.7* 6.0* 6.2*  ? ?HEMOGLOBIN A1C ?No components found for: HGA1C,  MPG ?CARDIAC ENZYMES ?Lab Results  ?Component Value Date  ? TROPONINI 0.04 (H) 03/05/2015  ? TROPONINI 0.05 (H) 03/05/2015  ? TROPONINI 0.05 (H) 03/05/2015  ? ?BNP ?No results for input(s): PROBNP in the last 8760 hours. ?TSH ?Recent Labs  ?  03/20/22 ?0732  ?TSH 2.492  ? ?CHOLESTEROL ?Recent Labs  ?  03/15/22 ?0408  ?CHOL 165  ? ? ?Scheduled Meds: ? amiodarone  400 mg Oral BID  ? apixaban  5 mg Oral BID  ? aspirin EC  81 mg Oral Daily  ? atorvastatin  80 mg Oral q1800  ? benzonatate  100 mg Oral BID  ? bisacodyl  10 mg Oral Daily  ? Or  ? bisacodyl  10 mg Rectal Daily  ? Chlorhexidine Gluconate Cloth  6 each Topical Daily  ? dextromethorphan-guaiFENesin  1 tablet Oral BID  ? digoxin  0.25 mg Oral Daily  ?  docusate sodium  200 mg Oral Daily  ? fluticasone furoate-vilanterol  1 puff Inhalation Daily  ? furosemide  40 mg Oral BID  ? metoprolol tartrate  12.5 mg Oral BID  ? montelukast  10 mg Oral QHS  ? pantoprazole  40 mg Oral Daily  ? potassium chloride  20 mEq Oral BID  ? sodium chloride flush  10-40 mL Intracatheter Q12H  ? sodium chloride flush  3 mL Intravenous Q12H  ? umeclidinium bromide  1 puff Inhalation Daily  ? ?Continuous Infusions: ? sodium chloride    ? ?PRN Meds:.sodium chloride, alum & mag hydroxide-simeth, hydrOXYzine, magnesium hydroxide, metoprolol tartrate, ondansetron (ZOFRAN) IV, oxyCODONE, sodium chloride flush, sodium chloride flush, temazepam ? ?Assessment/Plan: ? Unstable angina ?Multivessel CAD ?S/P 2 V CABG, post op day 8 ?HTN ?Morbid obesity ?COPD ?Chronic atrial fibrillation ?Weakness ? ?Plan: ?Increase activity as tolerated. ? ? LOS: 18 days  ? ?Time spent including chart review, lab review, examination, discussion with patient : 30 min ? ? ?Dixie Dials  MD  ?04/01/2022, 11:05 PM ? ? ? ? ?

## 2022-04-01 NOTE — Progress Notes (Signed)
Pt just ambulated with PT. Elevated his legs and set up d/c video. Will f/u after lunch. Will ed with pt and daughter tomorrow. ?1000-1008 ?Ethelda Chick CES, ACSM ?10:06 AM ?04/01/2022 ? ?

## 2022-04-01 NOTE — Progress Notes (Addendum)
Mobility Specialist Progress Note ? ? 04/01/22 1435  ?Mobility  ?Activity Ambulated independently in hallway  ?Level of Assistance Contact guard assist, steadying assist  ?Assistive Device None  ?Distance Ambulated (ft) 450 ft  ?Activity Response Tolerated well  ?$Mobility charge 1 Mobility  ? ?Post Mobility: 85 HR, 150/78 BP, 95% SpO2 ? ?Received pt in chair having no complaints and agreeable to mobility. Contact guard for unfamiliarity not for necessity. Asymptomatic throughout ambulation, returned back to chair w/ call bell in reach and all needs met. ? ?Holland Falling ?Mobility Specialist ?Phone Number [phone] ? ?

## 2022-04-02 ENCOUNTER — Other Ambulatory Visit (HOSPITAL_COMMUNITY): Payer: Self-pay

## 2022-04-02 LAB — BASIC METABOLIC PANEL
Anion gap: 7 (ref 5–15)
BUN: 12 mg/dL (ref 8–23)
CO2: 31 mmol/L (ref 22–32)
Calcium: 8.7 mg/dL — ABNORMAL LOW (ref 8.9–10.3)
Chloride: 94 mmol/L — ABNORMAL LOW (ref 98–111)
Creatinine, Ser: 0.76 mg/dL (ref 0.61–1.24)
GFR, Estimated: >60 mL/min (ref >60)
Glucose, Bld: 126 mg/dL — ABNORMAL HIGH (ref 70–99)
Potassium: 4 mmol/L (ref 3.5–5.1)
Sodium: 132 mmol/L — ABNORMAL LOW (ref 135–145)

## 2022-04-02 LAB — CBC
HCT: 33.7 % — ABNORMAL LOW (ref 39.0–52.0)
Hemoglobin: 11.3 g/dL — ABNORMAL LOW (ref 13.0–17.0)
MCH: 28.7 pg (ref 26.0–34.0)
MCHC: 33.5 g/dL (ref 30.0–36.0)
MCV: 85.5 fL (ref 80.0–100.0)
Platelets: 393 10*3/uL (ref 150–400)
RBC: 3.94 MIL/uL — ABNORMAL LOW (ref 4.22–5.81)
RDW: 14.6 % (ref 11.5–15.5)
WBC: 12.6 10*3/uL — ABNORMAL HIGH (ref 4.0–10.5)
nRBC: 0 % (ref 0.0–0.2)

## 2022-04-02 MED ORDER — POTASSIUM CHLORIDE CRYS ER 20 MEQ PO TBCR
20.0000 meq | EXTENDED_RELEASE_TABLET | Freq: Every day | ORAL | 3 refills | Status: DC
  Filled 2022-04-02: qty 30, 30d supply, fill #0

## 2022-04-02 MED ORDER — FUROSEMIDE 40 MG PO TABS
40.0000 mg | ORAL_TABLET | Freq: Two times a day (BID) | ORAL | 3 refills | Status: DC
Start: 2022-04-02 — End: 2022-04-20
  Filled 2022-04-02: qty 60, 30d supply, fill #0

## 2022-04-02 MED ORDER — ASPIRIN 81 MG PO TBEC: 81.0000 mg | DELAYED_RELEASE_TABLET | Freq: Every day | ORAL | 11 refills | Status: DC

## 2022-04-02 MED ORDER — CARVEDILOL 3.125 MG PO TABS
3.1250 mg | ORAL_TABLET | Freq: Two times a day (BID) | ORAL | 11 refills | Status: DC
Start: 2022-04-02 — End: 2022-05-21
  Filled 2022-04-02: qty 60, 30d supply, fill #0

## 2022-04-02 MED ORDER — AMIODARONE HCL 200 MG PO TABS
400.0000 mg | ORAL_TABLET | Freq: Two times a day (BID) | ORAL | 1 refills | Status: DC
  Filled 2022-04-02: qty 60, 47d supply, fill #0

## 2022-04-02 MED ORDER — OXYCODONE HCL 5 MG PO TABS
5.0000 mg | ORAL_TABLET | ORAL | 0 refills | Status: DC | PRN
  Filled 2022-04-02: qty 28, 5d supply, fill #0

## 2022-04-02 MED ORDER — DIGOXIN 250 MCG PO TABS
0.2500 mg | ORAL_TABLET | Freq: Every day | ORAL | 3 refills | Status: DC
  Filled 2022-04-02: qty 30, 30d supply, fill #0

## 2022-04-02 NOTE — Progress Notes (Signed)
Discussed with pt and daughter IS, sternal precautions, diet, exercise, and CRPII. Receptive. Will refer to [place].  ?[id number] ?Yves Dill CES, ACSM ?11:47 AM ?04/02/2022 ? ?

## 2022-04-02 NOTE — TOC Transition Note (Signed)
Transition of Care (TOC) - CM/SW Discharge Note ?Donn Pierini Charity fundraiser, BSN ?Transitions of Care ?Unit 4E- RN Case Manager ?See Treatment Team for direct phone #  ? ? ?Patient Details  ?Name: Arthur Church ?MRN: [id number] ?Date of Birth: July 10, 1956 ? ?Transition of Care (TOC) CM/SW Contact:  ?Zenda Alpers, Lenn Sink, RN ?Phone Number: ?04/02/2022, 10:49 AM ? ? ?Clinical Narrative:    ?Pt stable for transition home today, HHPT/OT has been [facility] with [facility]. Pt has declined any DME needs.  ?No further TOC needs noted, pt has transportation home.  ? ? ?Final next level of care: Home w Home Health Services ?Barriers to Discharge: Barriers Resolved ? ? ?Patient Goals and CMS Choice ?Patient states their goals for this hospitalization and ongoing recovery are:: return home ?CMS Medicare.gov Compare Post Acute Care list provided to:: Patient ?Choice offered to / list presented to : Patient ? ?Discharge Placement ?  ?           ? Home w/ HH ?  ?  ?  ? ?Discharge Plan and Services ?  ?Discharge Planning Services: CM Consult ?Post Acute Care Choice: Durable Medical Equipment, Home Health          ?DME Arranged: Walker rolling, Patient refused services ?DME Agency: NA ?  ?  ?  ?HH Arranged: PT, OT ?HH Agency: [facility] ?Date HH Agency Contacted: 04/01/22 ?Time HH Agency Contacted: 1138 ?Representative spoke with at [facility] Agency: Victorino Dike ? ?Social Determinants of Health (SDOH) Interventions ?  ? ? ?Readmission Risk Interventions ? ?  04/02/2022  ? 10:48 AM 04/01/2022  ? 11:39 AM  ?Readmission Risk Prevention Plan  ?Transportation Screening  Complete  ?PCP or Specialist Appt within 5-7 Days  Complete  ?PCP or Specialist Appt within 3-5 Days Complete   ?Home Care Screening  Complete  ?Medication Review (RN CM)  Complete  ?HRI or Home Care Consult Complete   ?Social Work Consult for Recovery Care Planning/Counseling Complete   ?Palliative Care Screening Not Applicable   ?Medication Review Oceanographer) Complete   ? ? ? ? ? ?

## 2022-04-02 NOTE — Progress Notes (Signed)
D/c tele. Went over AVS with pt and all questions were answered.  ? ?Lawson Radar, RN ? ?

## 2022-04-02 NOTE — Care Management Important Message (Signed)
Important Message ? ?Patient Details  ?Name: Arthur Church ?MRN: [id number] ?Date of Birth: 1956/01/17 ? ? ?Medicare Important Message Given:  Yes ? ? ? ? ?Renie Ora ?04/02/2022, 9:10 AM ?

## 2022-04-02 NOTE — Progress Notes (Signed)
RA PICC removed per protocol per MD order. Manual pressure applied for 5 mins. Vaseline gauze, gauze, and Tegaderm applied over insertion site. No bleeding or swelling noted. Instructed patient to remain in bed for thirty mins. Educated patient about S/S of infection and when to call MD; no heavy lifting or pressure on right side for 24 hours; keep dressing dry and intact for 24 hours. Pt verbalized comprehension.  ?

## 2022-04-02 NOTE — Progress Notes (Signed)
? ?   ?  [address] ?      [place] [address] ?            [phone]   ? ?  ?9 Days Post-Op Procedure(s) (LRB): ?CORONARY ARTERY BYPASS GRAFTING (CABG) TIMES TWO USING LEFT INTERNAL MAMMARY ARTERY AND ENDOSCOPICALLY HARVESTED RIGHT GREATER SAPHENOUS VEIN (N/A) ?TRANSESOPHAGEAL ECHOCARDIOGRAM (TEE) (N/A) ?ENDOVEIN HARVEST OF GREATER SAPHENOUS VEIN (Right) ? ?Subjective: ? ?Patient doing well.  He feels like his swelling has improved.  He did have some loose stools this morning.  + ambulation   + BM ? ?Objective: ?Vital signs in last 24 hours: ?Temp:  [97.7 ?F (36.5 ?C)-98.3 ?F (36.8 ?C)] 97.7 ?F (36.5 ?C) (04/20 0330) ?Pulse Rate:  [69-80] 69 (04/19 2332) ?Cardiac Rhythm: Atrial fibrillation (04/20 0340) ?Resp:  [13-20] 19 (04/20 0330) ?BP: (110-133)/(58-90) 121/58 (04/19 2332) ?SpO2:  [94 %-100 %] 96 % (04/20 0330) ?Weight:  [142.1 kg] 142.1 kg (04/20 0326) ? ?Intake/Output from previous day: ?04/19 0701 - 04/20 0700 ?In: 240 [P.O.:240] ?Out: 2800 [Urine:2800] ? ?General appearance: alert, cooperative, and no distress ?Heart: regular rate and rhythm ?Lungs: clear to auscultation bilaterally ?Abdomen: soft, non-tender; bowel sounds normal; no masses,  no organomegaly ?Extremities: edema trace to 1+ improving ?Wound: clean and dry ? ?Lab Results: ?Recent Labs  ?  04/01/22 ?0441 04/02/22 ?0500  ?WBC 14.9* 12.6*  ?HGB 11.7* 11.3*  ?HCT 35.0* 33.7*  ?PLT 375 393  ? ?BMET:  ?Recent Labs  ?  04/01/22 ?0441 04/02/22 ?0500  ?NA 132* 132*  ?K 4.5 4.0  ?CL 95* 94*  ?CO2 31 31  ?GLUCOSE 116* 126*  ?BUN 11 12  ?CREATININE 0.61 0.76  ?CALCIUM 8.9 8.7*  ?  ?PT/INR: No results for input(s): LABPROT, INR in the last 72 hours. ?ABG ?   ?Component Value Date/Time  ? PHART 7.416 03/25/2022 0556  ? HCO3 25.1 03/25/2022 0556  ? TCO2 26 03/25/2022 0556  ? ACIDBASEDEF 1.0 03/24/2022 2118  ? O2SAT 64.7 03/30/2022 0516  ? ?CBG (last 3)  ?Recent Labs  ?  03/30/22 ?1634 03/30/22 ?2107 03/31/22 ?0702  ?GLUCAP 104* 156* 95   ? ? ?Assessment/Plan: ?S/P Procedure(s) (LRB): ?CORONARY ARTERY BYPASS GRAFTING (CABG) TIMES TWO USING LEFT INTERNAL MAMMARY ARTERY AND ENDOSCOPICALLY HARVESTED RIGHT GREATER SAPHENOUS VEIN (N/A) ?TRANSESOPHAGEAL ECHOCARDIOGRAM (TEE) (N/A) ?ENDOVEIN HARVEST OF GREATER SAPHENOUS VEIN (Right) ? ?CV- rate controlled Atrial Fibrillation- on Amiodarone, Digoxin, will transition back to Carvedilol which he took prior to admission ?Pulm- no acute issues, continue IS ?Renal- creatinine remains stable at 0.76, weight is trending down, continue Lasix, potassium ?Deconditioning- home health has been arranged ?Dispo- patient stable, will d/c home today ? ? LOS: 19 days  ? ? ?Lowella Dandy, PA-C ?04/02/2022 ? ? ?

## 2022-04-03 ENCOUNTER — Telehealth (HOSPITAL_COMMUNITY): Payer: Self-pay

## 2022-04-03 NOTE — Telephone Encounter (Signed)
Called patient to see if he is interested in the Cardiac Rehab Program. Patient expressed interest. Explained scheduling process and went over insurance, patient verbalized understanding. Will contact patient for scheduling once f/u has been completed.  °

## 2022-04-03 NOTE — Telephone Encounter (Signed)
Pt insurance is active and benefits verified through [facility]. Co-pay $10.00, DED $0.00/$0.00 met, out of pocket $3,900.00/$34.80 met, co-insurance 0%. No pre-authorization required. Passport, 04/03/22 @ 2:47PM, VFM#[id number] ?  ?Will contact patient to see if he is interested in the Cardiac Rehab Program. If interested, patient will need to complete follow up appt. Once completed, patient will be contacted for scheduling upon review by the RN Navigator. ?

## 2022-04-09 ENCOUNTER — Other Ambulatory Visit: Payer: Self-pay

## 2022-04-09 ENCOUNTER — Other Ambulatory Visit (HOSPITAL_COMMUNITY): Payer: Self-pay

## 2022-04-09 ENCOUNTER — Other Ambulatory Visit: Payer: Self-pay | Admitting: Cardiothoracic Surgery

## 2022-04-09 MED ORDER — TRAMADOL HCL 50 MG PO TABS: 50.0000 mg | ORAL_TABLET | ORAL | 0 refills | Status: DC | PRN

## 2022-04-09 NOTE — Progress Notes (Signed)
Patient sent refill request through pharmacy. Contacted patient, states mainly taking medication now one in the morning and one at night before bed. States that his chest aches after exercise, walking. He is s/p CABG with Dr. Donata Clay 03/24/22 and was discharged from the hospital 04/02/22. Spoke with Jillyn Hidden, PA who states that patient can have Tramadol 50 mg PO PRN Q4hrs for pain. Prescription phoned into patients preferred pharmacy, [facility] on [address] in [place]. Patient called and made aware and acknowledged receipt.  ?

## 2022-04-16 ENCOUNTER — Other Ambulatory Visit (HOSPITAL_COMMUNITY): Payer: Self-pay

## 2022-04-17 ENCOUNTER — Other Ambulatory Visit: Payer: Self-pay | Admitting: Cardiothoracic Surgery

## 2022-04-17 DIAGNOSIS — Z951 Presence of aortocoronary bypass graft: Secondary | ICD-10-CM

## 2022-04-20 ENCOUNTER — Ambulatory Visit (INDEPENDENT_AMBULATORY_CARE_PROVIDER_SITE_OTHER): Payer: Self-pay | Admitting: Physician Assistant

## 2022-04-20 ENCOUNTER — Ambulatory Visit
Admission: RE | Admit: 2022-04-20 | Discharge: 2022-04-20 | Disposition: A | Discharge status: Home / Self Care | Payer: Medicare (Managed Care) | Source: Ambulatory Visit | Attending: Cardiothoracic Surgery | Admitting: Cardiothoracic Surgery

## 2022-04-20 ENCOUNTER — Encounter: Payer: Self-pay | Admitting: Nurse Practitioner

## 2022-04-20 ENCOUNTER — Ambulatory Visit (INDEPENDENT_AMBULATORY_CARE_PROVIDER_SITE_OTHER): Payer: Medicare (Managed Care) | Admitting: Nurse Practitioner

## 2022-04-20 VITALS — BP 125/78 | HR 64 | Ht 73.0 in | Wt 307.2 lb

## 2022-04-20 DIAGNOSIS — I5042 Chronic combined systolic (congestive) and diastolic (congestive) heart failure: Secondary | ICD-10-CM | POA: Diagnosis not present

## 2022-04-20 DIAGNOSIS — Z951 Presence of aortocoronary bypass graft: Secondary | ICD-10-CM

## 2022-04-20 DIAGNOSIS — R7303 Prediabetes: Secondary | ICD-10-CM

## 2022-04-20 DIAGNOSIS — E785 Hyperlipidemia, unspecified: Secondary | ICD-10-CM

## 2022-04-20 DIAGNOSIS — I2510 Atherosclerotic heart disease of native coronary artery without angina pectoris: Secondary | ICD-10-CM

## 2022-04-20 DIAGNOSIS — I340 Nonrheumatic mitral (valve) insufficiency: Secondary | ICD-10-CM | POA: Diagnosis not present

## 2022-04-20 DIAGNOSIS — I10 Essential (primary) hypertension: Secondary | ICD-10-CM

## 2022-04-20 DIAGNOSIS — J449 Chronic obstructive pulmonary disease, unspecified: Secondary | ICD-10-CM

## 2022-04-20 DIAGNOSIS — I4819 Other persistent atrial fibrillation: Secondary | ICD-10-CM

## 2022-04-20 DIAGNOSIS — Z79899 Other long term (current) drug therapy: Secondary | ICD-10-CM

## 2022-04-20 MED ORDER — POTASSIUM CHLORIDE ER 10 MEQ PO TBCR: 10.0000 meq | EXTENDED_RELEASE_TABLET | Freq: Every day | ORAL | 3 refills | Status: DC

## 2022-04-20 MED ORDER — FUROSEMIDE 40 MG PO TABS: ORAL_TABLET | ORAL | 3 refills | Status: DC

## 2022-04-20 NOTE — Patient Instructions (Signed)
You may return to driving an automobile as long as you are no longer requiring oral narcotic pain relievers during the daytime.  It would be wise to start driving only short distances during the daylight and gradually increase from there as you feel comfortable.  You may continue to gradually increase your physical activity as tolerated.  Refrain from any heavy lifting or strenuous use of your arms and shoulders until at least 8 weeks from the time of your surgery, and avoid activities that cause increased pain in your chest on the side of your surgical incision.  Otherwise you may continue to increase activities without any particular limitations.  Increase the intensity and duration of physical activity gradually.  You are encouraged to enroll and participate in the outpatient cardiac rehab program beginning as soon as practical. 

## 2022-04-20 NOTE — Patient Instructions (Addendum)
Medication Instructions:  ?STOP Digoxin as directed ?Continue Amiodarone 200 mg daily ?DECREASE Potassium 10 MEQ daily  ?Lasix 40 mg daily and add an addition 40 mg on the days of swelling ( 3 lb weight gain over night or 5 lb weight gain in 1 week)  ?*If you need a refill on your cardiac medications before your next appointment, please call your pharmacy* ? ? ?Lab Work: ?Your physician recommends that you complete labs today ?Magnesium  ? ? ?If you have labs (blood work) drawn today and your tests are completely normal, you will receive your results only by: ?MyChart Message (if you have MyChart) OR ?A paper copy in the mail ?If you have any lab test that is abnormal or we need to change your treatment, we will call you to review the results. ? ? ?Testing/Procedures: ?NONE ordered at this time of appointment  ? ? ? ?Follow-Up: ?At [facility], you and your health needs are our priority.  As part of our continuing mission to provide you with exceptional heart care, we have created designated Provider Care Teams.  These Care Teams include your primary Cardiologist (physician) and Advanced Practice Providers (APPs -  Physician Assistants and Nurse Practitioners) who all work together to provide you with the care you need, when you need it. ? ?We recommend signing up for the patient portal called "MyChart".  Sign up information is provided on this After Visit Summary.  MyChart is used to connect with patients for Virtual Visits (Telemedicine).  Patients are able to view lab/test results, encounter notes, upcoming appointments, etc.  Non-urgent messages can be sent to your provider as well.   ?To learn more about what you can do with MyChart, go to [email or web address].   ? ?Your next appointment:   ?3-4 month(s) ? ?The format for your next appointment:   ?In Person ? ?Provider:   ?Chrystie Nose, MD   ? ? ?Other Instructions ?Referral sent to EP [name] ? ?Important Information About Sugar ? ? ? ? ? ? ?

## 2022-04-20 NOTE — Progress Notes (Signed)
? ? ?Office Visit  ?  ?Patient Name: Arthur Church ?Date of Encounter: 04/20/2022 ? ?Primary Care Provider:  Virgilio Belling, PA-C ?Primary Cardiologist:  Chrystie Nose, MD ? ?Chief Complaint  ?  ?66 year old male with a history of CAD s/p CABG x2, chronic combined systolic and diastolic heart failure, persistent atrial fibrillation, hypertension, hyperlipidemia, prior EtOH/ tobacco use, COPD/emphysema, and prediabetes who presents for post hospital follow-up related to CAD s/p CABG x2. ? ?Past Medical History  ?  ?Past Medical History:  ?Diagnosis Date  ? Chronic systolic CHF (congestive heart failure) (HCC)   ? a. 2D ECHO (03/06/15) EF 20-25%, diffuse HK. Asc aortic diameter: 50mm. Mild LA dilation, mild RV dilation. Mild RV systolic dysfunction   b. LifeVest placed on 02/2015 admissoin   ? Coronary artery disease   ? a. 70% LAD lesion  ? Elevated TSH   ? ETOH abuse   ? Hypertension   ? Morbid obesity (HCC)   ? a. BMI ~46  ? Persistent atrial fibrillation (HCC)   ? a. newly dx 02/2015 admission. s/p successful TEE/DCCV  b. on Eliquis  ? Pre-diabetes   ? a. HgA1c 6.1  ? ?Past Surgical History:  ?Procedure Laterality Date  ? CARDIAC CATHETERIZATION N/A 04/26/2015  ? Procedure: Left Heart Cath and Coronary Angiography;  Surgeon: Lyn Records, MD;  Location: [facility] INVASIVE CV LAB;  Service: Cardiovascular;  Laterality: N/A;  ? CARDIOVERSION N/A 03/06/2015  ? Procedure: CARDIOVERSION;  Surgeon: Laurey Morale, MD;  Location[facility] ENDOSCOPY;  Service: Cardiovascular;  Laterality: N/A;  ? CORONARY ANGIOGRAPHY N/A 03/18/2022  ? Procedure: CORONARY ANGIOGRAPHY;  Surgeon: Kathleene Hazel, MD;  Location: [facility] INVASIVE CV LAB;  Service: Cardiovascular;  Laterality: N/A;  ? CORONARY ARTERY BYPASS GRAFT N/A 03/24/2022  ? Procedure: CORONARY ARTERY BYPASS GRAFTING (CABG) TIMES TWO USING LEFT INTERNAL MAMMARY ARTERY AND ENDOSCOPICALLY HARVESTED RIGHT GREATER SAPHENOUS VEIN;  Surgeon: Lovett Sox, MD;  Location: [facility] OR;   Service: Open Heart Surgery;  Laterality: N/A;  ? ENDOVEIN HARVEST OF GREATER SAPHENOUS VEIN Right 03/24/2022  ? Procedure: ENDOVEIN HARVEST OF GREATER SAPHENOUS VEIN;  Surgeon: Lovett Sox, MD;  Location: [facility] OR;  Service: Open Heart Surgery;  Laterality: Right;  ? HERNIA REPAIR    ? LEFT HEART CATH N/A 03/16/2022  ? Procedure: Left Heart Cath;  Surgeon: [name], MD;  Location: [facility] INVASIVE CV LAB;  Service: Cardiovascular;  Laterality: N/A;  ? TEE WITHOUT CARDIOVERSION N/A 03/06/2015  ? Procedure: TRANSESOPHAGEAL ECHOCARDIOGRAM (TEE);  Surgeon: Laurey Morale, MD;  Location: [facility] ENDOSCOPY;  Service: Cardiovascular;  Laterality: N/A;  ? TEE WITHOUT CARDIOVERSION N/A 05/02/2015  ? Procedure: TRANSESOPHAGEAL ECHOCARDIOGRAM (TEE);  Surgeon: Lewayne Bunting, MD;  Location: [facility] ENDOSCOPY;  Service: Cardiovascular;  Laterality: N/A;  ? TEE WITHOUT CARDIOVERSION N/A 03/24/2022  ? Procedure: TRANSESOPHAGEAL ECHOCARDIOGRAM (TEE);  Surgeon: Lovett Sox, MD;  Location: [facility] OR;  Service: Open Heart Surgery;  Laterality: N/A;  ? ? ?Allergies ? ?No Known Allergies ? ?History of Present Illness  ?  ?66 year old male with the above past medical history including CAD s/p CABG x2, chronic combined systolic and diastolic heart failure, persistent atrial fibrillation, hypertension, hyperlipidemia, prior EtOH/ tobacco use,  ?COPD/emphysema, and prediabetes. ? ?He was hospitalized in March 2016 with new onset CHF and atrial flutter. He underwent DCCV in March 2016, but ultimately reverted back to atrial fibrillation, previously on Tikosyn, rate controlled on Elquis.  Cardiac catheterization in May 2016 showed calcified mLAD 70% stenosis,  EF 20%. EF was thought to be disproportionate to CAD, likely tachy-mediated, medical therapy was recommended. Follow-up echocardiogram in September 2016 showed EF 55 to 60%, mild LVH, G1DD. He was last seen in the office on 03/08/2020 and was stable overall from a cardiac standpoint.  He has not  been seen in the office since.  He contacted our office in March 2023 with complaints of chest pain, elevated BP.  He was advised to go to the ED.  He ultimately presented to the ED on 03/14/2022. Troponin was mildly elevated. He was hospitalized from 03/14/2022 to 04/02/2022. Repeat echocardiogram showed EF 55 to 60%, mild concentric LVH, G1 DD, mild to moderate mitral valve regurgitation. He underwent cardiac catheterization on 03/16/2022 which showed p-mLAD 85%, pRCA 90%. CT surgery was consulted and he underwent CABG x2 (LIMA-LAD, SVG-PL) on 03/24/2022. His post-surgery hospital course was uncomplicated, though he did experience generalized weakness in the setting of physical deconditioning. He was transitioned from Tikosyn to amiodarone. He was referred to the advanced lipid clinic for consideration of PCSK9 inhibitor.  He was discharged home in stable condition on 04/02/2022. ? ?He presents today for follow-up accompanied by his daughter. Since his hospitalization he has been stable from a cardiac standpoint.  He denies any symptoms concerning for angina, denies palpitations, denies weight gain, worsening edema.  He does have some mild right lower extremity edema, this is the same side where he had his vein graft procedure.  He does have several questions about his medications going forward.  He has an appointment scheduled with CT surgery later this afternoon. Overall, he reports feeling well and denies any additional concerns today. ? ?Home Medications  ?  ?Current Outpatient Medications  ?Medication Sig Dispense Refill  ? acetaminophen (TYLENOL) 500 MG tablet Take 500 mg by mouth every 6 (six) hours as needed for mild pain or moderate pain.    ? ALPRAZolam (XANAX) 0.5 MG tablet TAKE 1 TABLET BY MOUTH THREE TIMES DAILYAS NEEDED FOR ANXIETY (Patient taking differently: Take 0.5 mg by mouth 3 (three) times daily as needed for anxiety.) 20 tablet 0  ? AMBULATORY NON FORMULARY MEDICATION CPAP mask and tubing 1 each 0  ?  AMBULATORY NON FORMULARY MEDICATION Portable oxygen concentrator, 3 L [place]. ? ?Please fax to aero care (Patient taking differently: Inhale 2-3 L into the lungs continuous. Portable oxygen concentrator, 3 L [place]. ? ?Please fax to aero care) 1 each 0  ? amiodarone (PACERONE) 200 MG tablet Take 2 tablets (400 mg total) by mouth 2 (two) times daily for 2 days, then decrease to 200 mg twice daily for 7 days, then decrease to 200 mg daily 60 tablet 1  ? apixaban (ELIQUIS) 5 MG TABS tablet Take 1 tablet (5 mg total) by mouth 2 (two) times daily. 180 tablet 3  ? Artificial Tear Ointment (DRY EYES OP) Place 1 drop into the nose daily as needed (dry eyes).    ? aspirin EC 81 MG EC tablet Take 1 tablet (81 mg total) by mouth daily. Swallow whole. 30 tablet 11  ? atorvastatin (LIPITOR) 80 MG tablet Take 1 tablet (80 mg total) by mouth daily. (Patient taking differently: Take 80 mg by mouth at bedtime.) 90 tablet 3  ? benzonatate (TESSALON) 200 MG capsule Take by mouth.    ? calcium carbonate (TUMS - DOSED IN MG ELEMENTAL CALCIUM) 500 MG chewable tablet Chew 3 tablets by mouth 2 (two) times daily as needed for indigestion or heartburn.    ?  carvedilol (COREG) 3.125 MG tablet Take 1 tablet (3.125 mg total) by mouth 2 (two) times daily. 60 tablet 11  ? diphenhydramine-acetaminophen (TYLENOL PM) 25-500 MG TABS tablet Take 1 tablet by mouth at bedtime.    ? furosemide (LASIX) 40 MG tablet Take 40 mg daily as directed. Take an additional 40 mg for weight gain of 3 lb over night or 5 lb weight gain in 1 week. 180 tablet 3  ? loratadine (CLARITIN) 10 MG tablet Take 10 mg by mouth daily.    ? Magnesium 400 MG TABS Take 400 mg by mouth 2 (two) times a day. (Patient taking differently: Take 400 mg by mouth at bedtime.) 60 tablet 11  ? montelukast (SINGULAIR) 10 MG tablet Take 10 mg by mouth at bedtime.    ? oxymetazoline (AFRIN) 0.05 % nasal spray Place 1 spray into left nostril daily as needed for congestion.    ? pantoprazole (PROTONIX) 40  MG tablet Take 1 tablet (40 mg total) by mouth daily. 90 tablet 1  ? potassium chloride (KLOR-CON) 10 MEQ tablet Take 1 tablet (10 mEq total) by mouth daily. 90 tablet 3  ? traMADol (ULTRAM) 50 MG tablet Take

## 2022-04-20 NOTE — Progress Notes (Signed)
? ?   ?[address] ?      [place] [address] ?            [phone]   ? ?   ?HPI: ?This is a 66 year old male with a past medical history of hypertension, morbid obesity, COPD,persistent atrial fibrillation, chronic systolic heart failure, pre ?diabetes, and etoh abuse who present today for post op follow up. He is s/p CABG x 2 by Dr. Prescott Gum on 03/24/2022. ?He walks 3 times per day for about 20 minutes each time. He denies shortness of breath or chest pain. He did have right foot swelling as well. ? ? ?Current Outpatient Medications  ?Medication Sig Dispense Refill  ? acetaminophen (TYLENOL) 500 MG tablet Take 500 mg by mouth every 6 (six) hours as needed for mild pain or moderate pain.    ? ALPRAZolam (XANAX) 0.5 MG tablet TAKE 1 TABLET BY MOUTH THREE TIMES DAILYAS NEEDED FOR ANXIETY (Patient taking differently: Take 0.5 mg by mouth 3 (three) times daily as needed for anxiety.) 20 tablet 0  ? AMBULATORY NON FORMULARY MEDICATION CPAP mask and tubing 1 each 0  ? AMBULATORY NON FORMULARY MEDICATION Portable oxygen concentrator, 3 L [place]. ? ?Please fax to aero care (Patient taking differently: Inhale 2-3 L into the lungs continuous. Portable oxygen concentrator, 3 L [place]. ? ?Please fax to aero care) 1 each 0  ? amiodarone (PACERONE) 200 MG tablet Take 2 tablets (400 mg total) by mouth 2 (two) times daily for 2 days, then decrease to 200 mg twice daily for 7 days, then decrease to 200 mg daily 60 tablet 1  ? apixaban (ELIQUIS) 5 MG TABS tablet Take 1 tablet (5 mg total) by mouth 2 (two) times daily. 180 tablet 3  ? Artificial Tear Ointment (DRY EYES OP) Place 1 drop into the nose daily as needed (dry eyes).    ? aspirin EC 81 MG EC tablet Take 1 tablet (81 mg total) by mouth daily. Swallow whole. 30 tablet 11  ? atorvastatin (LIPITOR) 80 MG tablet Take 1 tablet (80 mg total) by mouth daily. (Patient taking differently: Take 80 mg by mouth at bedtime.) 90 tablet 3  ? budesonide-formoterol (SYMBICORT)  160-4.5 MCG/ACT inhaler Inhale 2 puffs into the lungs 2 (two) times daily. (Patient not taking: Reported on 03/14/2022) 1 Inhaler 0  ? calcium carbonate (TUMS - DOSED IN MG ELEMENTAL CALCIUM) 500 MG chewable tablet Chew 3 tablets by mouth 2 (two) times daily as needed for indigestion or heartburn.    ? carvedilol (COREG) 3.125 MG tablet Take 1 tablet (3.125 mg total) by mouth 2 (two) times daily. 60 tablet 11  ? digoxin (LANOXIN) 0.25 MG tablet Take 1 tablet (0.25 mg total) by mouth daily. 30 tablet 3  ? diphenhydramine-acetaminophen (TYLENOL PM) 25-500 MG TABS tablet Take 1 tablet by mouth at bedtime.    ? furosemide (LASIX) 40 MG tablet Take 1 tablet (40 mg total) by mouth 2 (two) times daily for 7 days, then change to as needed use for weight gain 60 tablet 3  ? loratadine (CLARITIN) 10 MG tablet Take 10 mg by mouth daily.    ? Magnesium 400 MG TABS Take 400 mg by mouth 2 (two) times a day. (Patient taking differently: Take 400 mg by mouth at bedtime.) 60 tablet 11  ? montelukast (SINGULAIR) 10 MG tablet Take 10 mg by mouth at bedtime.    ? oxymetazoline (AFRIN) 0.05 % nasal spray Place 1  spray into left nostril daily as needed for congestion.    ? pantoprazole (PROTONIX) 40 MG tablet Take 1 tablet (40 mg total) by mouth daily. 90 tablet 1  ? potassium chloride SA (KLOR-CON M) 20 MEQ tablet Take 1 tablet (20 mEq total) by mouth daily on days you take Lasix. 30 tablet 3  ? traMADol (ULTRAM) 50 MG tablet Take 1 tablet (50 mg total) by mouth every 4 (four) hours as needed. 30 tablet 0  ? TRELEGY ELLIPTA 100-62.5-25 MCG/ACT AEPB Inhale 1 puff into the lungs daily.    ? umeclidinium-vilanterol (ANORO ELLIPTA) 62.5-25 MCG/INH AEPB Inhale 1 puff into the lungs daily. (Patient not taking: Reported on 03/14/2022) 2 each 0  ?Vital Signs: ?Vitals:  ? 04/20/22 1256  ?BP: (!) 142/77  ?Pulse: 64  ?Resp: 20  ?SpO2: 94%  ?  ? ? ?Physical Exam: ?CV-RRR ?Pulmonary-Clear to auscultation bilaterally ?Abdomen-Soft, obese, non  tender ?Extremities-Mild LE edema ?Wounds-Clean, dry, well healed without sign of infection ? ?Diagnostic Tests: ?CLINICAL DATA:  Status post CABG. ?  ?EXAM: ?CHEST - 2 VIEW ?  ?COMPARISON:  03/31/2022 ?  ?FINDINGS: ?Stable asymmetric elevation left hemidiaphragm. The lungs are clear without focal pneumonia, edema, pneumothorax or pleural effusion. ?Nodular density again projects over the right lung base, similar to prior and compatible with a nipple shadow with no right lower lobe pulmonary nodule visible on CT chest of 03/20/2022. Tiny left ?pleural effusion evident. The cardiopericardial silhouette is within normal limits for size. The visualized bony structures of the thorax ?are unremarkable. ?  ?IMPRESSION: ?Tiny left pleural effusion. Otherwise no acute cardiopulmonary findings. ?  ?  ?Electronically Signed ?  By: [name] M.D. ?  On: 04/20/2022 12:48 ? ?Impression and Plan: ?He is recovering well from CABG x 2. He was [name] NP from cardiology earlier today. She has decreased Lasix to 40 mg daily and stopped Digoxin. ?Patient to continue on Amiodarone 200 mg daily and Apixaban. He will likely follow up with EP to see if there are any other recommendations regarding his a fib management. Patient has seen his PCP and HGA1C 5.8. We discussed low fat, low carb diet. Patient was instructed to continue with sternal precautions (I.e. no lifting more than ?10 pounds) for the next 4 weeks. As long as the patient is NOT taking any narcotic ?for pain, he may begin driving short distances (I.e. 30 minutes or less during the day) and may gradually increase the frequency and duration as ?tolerates. We encourage the patient to participate in cardiac rehab;but because of the month long wait, he does not wish to do so. He has been staying with his daughter since surgery. He wants to return to his home, which he was instructed that is fine at this point. He is to wait at least 8 week before getting his next COVID  booster. He will return in about 4 weeks. We will obtain another CXR as small nipple shadow present on today's chest x ray (no nodule seen on previous CT or CXR) and he will see Dr. Prescott Gum. ? ? ? ?Nani Skillern, PA-C ?[facility] ?[phone] ? ?

## 2022-04-21 ENCOUNTER — Telehealth (INDEPENDENT_AMBULATORY_CARE_PROVIDER_SITE_OTHER): Payer: Medicare (Managed Care) | Admitting: Internal Medicine

## 2022-04-21 ENCOUNTER — Encounter: Payer: Self-pay | Admitting: Internal Medicine

## 2022-04-21 ENCOUNTER — Telehealth: Payer: Self-pay

## 2022-04-21 VITALS — BP 125/78 | HR 64

## 2022-04-21 DIAGNOSIS — E785 Hyperlipidemia, unspecified: Secondary | ICD-10-CM | POA: Diagnosis not present

## 2022-04-21 DIAGNOSIS — Z951 Presence of aortocoronary bypass graft: Secondary | ICD-10-CM | POA: Diagnosis not present

## 2022-04-21 DIAGNOSIS — I10 Essential (primary) hypertension: Secondary | ICD-10-CM

## 2022-04-21 DIAGNOSIS — I4819 Other persistent atrial fibrillation: Secondary | ICD-10-CM

## 2022-04-21 DIAGNOSIS — I2510 Atherosclerotic heart disease of native coronary artery without angina pectoris: Secondary | ICD-10-CM

## 2022-04-21 LAB — MAGNESIUM: Magnesium: 2.2 mg/dL (ref 1.6–2.3)

## 2022-04-21 NOTE — Progress Notes (Signed)
? ?Virtual Visit via Video Note  ? ?This visit type was conducted due to national recommendations for restrictions regarding the COVID-19 Pandemic (e.g. social distancing) in an effort to limit this patient's exposure and mitigate transmission in our community.  Due to his co-morbid illnesses, this patient is at least at moderate risk for complications without adequate follow up.  This format is felt to be most appropriate for this patient at this time.  All issues noted in this document were discussed and addressed.  A limited physical exam was performed with this format.  Please refer to the patient's chart for his consent to telehealth for [facility]. ? ?   ? ?Date:  04/21/2022  ? ?ID:  Arthur Church, DOB December 31, 1955, MRN [id number] ?The patient was identified using 2 identifiers. ? ?Evaluation Performed:  Follow-Up Visit ? ?Patient Location:  ?[address] ?[place] [address] ? ?Provider location:   ?[address], [address] ?[place], [place] [address] ? ?PCP:  Virgilio Belling, PA-C  ?Cardiologist:  Chrystie Nose, MD ?Electrophysiologist:  None  ? ?Chief Complaint:  Follow-up CABG, lipid management ? ?History of Present Illness:   ? ?Arthur Church is a 66 y.o. male who presents via audio/video conferencing for a telehealth visit today.  Arthur Church is a pleasant 66 year old male followed for a number of years with a history of chronic systolic congestive heart failure, alcohol abuse, morbid obesity and persistent atrial fibrillation.  He also developed coronary artery disease including a 70% heavily calcified LAD lesion seen in 2016.  Medical therapy was recommended.  He did quite well until fairly recently when he had developed chest pain.  At the time he had transition to a different cardiologist as the insurance would not allow him to continue to see me but then he recently he went on to Medicare and has been able to reestablish care with our group.  Catheterization in April showed  two-vessel coronary disease with heavily calcified and severely stenotic right coronary artery as well as the LAD and with an unsuccessful intervention, he was referred for two-vessel CABG.  He did have surgery with Dr. Maren Beach and is recovering well.  He was seen yesterday by Bernadene Person, NP.  Discussed the case with me.  Although he has a history of heart failure with EF as low as 20%, more recently echo showed normal heart function with EF 55 to 60%.  He did have some lower extremity LE edema but was changed to as needed Lasix.  He has had longstanding persistent atrial fibrillation.  He was great controlled on Tikosyn.  Around surgery he was transitioned to amiodarone and digoxin.  In follow-up yesterday his EKG showed sinus rhythm.  I advise stopping the digoxin due to risk of toxicity and maintaining on amiodarone and Eliquis for anticoagulation.  He did not have left atrial appendage closure.  He has also had persistent dyslipidemia.  His last LDL cholesterol was 111 in April on 80 mg atorvastatin.  He reports compliance with this however had been on a ketogenic diet up until that hospitalization having lost 75 pounds.  He reported of fairly high intake of saturated fats which she has discontinued since surgery.  He felt that a lot of his cholesterol may be attributable to that. ? ?The patient does not have symptoms concerning for COVID-19 infection (fever, chills, cough, or new SHORTNESS OF BREATH).  ? ? ?Prior CV studies:   ?The following studies were reviewed today: ? ?Chart  reviewed, lab work ? ?PMHx:  ?Past Medical History:  ?Diagnosis Date  ? Chronic systolic CHF (congestive heart failure) (HCC)   ? a. 2D ECHO (03/06/15) EF 20-25%, diffuse HK. Asc aortic diameter: 80mm. Mild LA dilation, mild RV dilation. Mild RV systolic dysfunction   b. LifeVest placed on 02/2015 admissoin   ? Coronary artery disease   ? a. 70% LAD lesion  ? Elevated TSH   ? ETOH abuse   ? Hypertension   ? Morbid obesity (HCC)   ? a.  BMI ~46  ? Persistent atrial fibrillation (HCC)   ? a. newly dx 02/2015 admission. s/p successful TEE/DCCV  b. on Eliquis  ? Pre-diabetes   ? a. HgA1c 6.1  ? ? ?Past Surgical History:  ?Procedure Laterality Date  ? CARDIAC CATHETERIZATION N/A 04/26/2015  ? Procedure: Left Heart Cath and Coronary Angiography;  Surgeon: Lyn Records, MD;  Location: [facility] INVASIVE CV LAB;  Service: Cardiovascular;  Laterality: N/A;  ? CARDIOVERSION N/A 03/06/2015  ? Procedure: CARDIOVERSION;  Surgeon: Laurey Morale, MD;  Location: [facility] ENDOSCOPY;  Service: Cardiovascular;  Laterality: N/A;  ? CORONARY ANGIOGRAPHY N/A 03/18/2022  ? Procedure: CORONARY ANGIOGRAPHY;  Surgeon: Kathleene Hazel, MD;  Location: [facility] INVASIVE CV LAB;  Service: Cardiovascular;  Laterality: N/A;  ? CORONARY ARTERY BYPASS GRAFT N/A 03/24/2022  ? Procedure: CORONARY ARTERY BYPASS GRAFTING (CABG) TIMES TWO USING LEFT INTERNAL MAMMARY ARTERY AND ENDOSCOPICALLY HARVESTED RIGHT GREATER SAPHENOUS VEIN;  Surgeon: Lovett Sox, MD;  Location: [facility] OR;  Service: Open Heart Surgery;  Laterality: N/A;  ? ENDOVEIN HARVEST OF GREATER SAPHENOUS VEIN Right 03/24/2022  ? Procedure: ENDOVEIN HARVEST OF GREATER SAPHENOUS VEIN;  Surgeon: Lovett Sox, MD;  Location: [facility] OR;  Service: Open Heart Surgery;  Laterality: Right;  ? HERNIA REPAIR    ? LEFT HEART CATH N/A 03/16/2022  ? Procedure: Left Heart Cath;  Surgeon: [name], MD;  Location: [facility] INVASIVE CV LAB;  Service: Cardiovascular;  Laterality: N/A;  ? TEE WITHOUT CARDIOVERSION N/A 03/06/2015  ? Procedure: TRANSESOPHAGEAL ECHOCARDIOGRAM (TEE);  Surgeon: Laurey Morale, MD;  Location: [facility] ENDOSCOPY;  Service: Cardiovascular;  Laterality: N/A;  ? TEE WITHOUT CARDIOVERSION N/A 05/02/2015  ? Procedure: TRANSESOPHAGEAL ECHOCARDIOGRAM (TEE);  Surgeon: Lewayne Bunting, MD;  Location: [facility] ENDOSCOPY;  Service: Cardiovascular;  Laterality: N/A;  ? TEE WITHOUT CARDIOVERSION N/A 03/24/2022  ? Procedure: TRANSESOPHAGEAL ECHOCARDIOGRAM (TEE);   Surgeon: Lovett Sox, MD;  Location: [facility] OR;  Service: Open Heart Surgery;  Laterality: N/A;  ? ? ?FAMHx:  ?Family History  ?Problem Relation Age of Onset  ? Uterine cancer Sister   ? Heart attack Mother   ? Breast cancer Mother   ? Sleep apnea Brother   ? Heart attack Maternal Grandfather   ? Sleep apnea Brother   ? ? ?SOCHx:  ? reports that he quit smoking about 9 years ago. His smoking use included cigarettes. He has never used smokeless tobacco. He reports current alcohol use. He reports that he does not use drugs. ? ?ALLERGIES:  ?No Known Allergies ? ?MEDS: ? ?No outpatient medications have been marked as taking for the 04/21/22 encounter (Video Visit) with Chrystie Nose, MD.  ?  ? ?ROS: ?Pertinent items noted in HPI and remainder of comprehensive ROS otherwise negative. ? ?Labs/Other Tests and Data Reviewed:   ? ?Recent Labs: ?03/20/2022: TSH 2.492 ?03/30/2022: ALT 18 ?04/02/2022: BUN 12; Creatinine, Ser 0.76; Hemoglobin 11.3; Platelets 393; Potassium 4.0; Sodium 132 ?04/20/2022: Magnesium 2.2  ? ?Recent Lipid  Panel ?Lab Results  ?Component Value Date/Time  ? CHOL 165 03/15/2022 04:08 AM  ? TRIG 91 03/15/2022 04:08 AM  ? HDL 36 (L) 03/15/2022 04:08 AM  ? CHOLHDL 4.6 03/15/2022 04:08 AM  ? LDLCALC 111 (H) 03/15/2022 04:08 AM  ? ? ?Wt Readings from Last 3 Encounters:  ?04/20/22 (!) 307 lb (139.3 kg)  ?04/20/22 (!) 307 lb 3.2 oz (139.3 kg)  ?04/02/22 (!) 313 lb 4.4 oz (142.1 kg)  ?  ? ?Exam:   ? ?Vital Signs:  BP 125/78   Pulse 64   ? ?General appearance: alert, no distress, and morbidly obese ?Lungs: No audible wheezing ?Abdomen: Obese ?Extremities: extremities normal, atraumatic, no cyanosis or edema ?Skin: Skin color, texture, turgor normal. No rashes or lesions ?Neurologic: Grossly normal ?Psych: Pleasant ? ?ASSESSMENT & PLAN:   ? ?CAD status post CABG x2 (LIMA to LAD, SVG to PL)-03/2022 ?Chronic combined systolic and diastolic heart failure, LVEF 55 to 60% with mild to moderate MR ?Morbid obesity with recent  weight loss ?Hypertension ?Dyslipidemia, goal LDL less than 70 ?Longstanding persistent atrial fibrillation ?COPD/emphysema ? ?Mr. Lawhead seems to be doing well after two-vessel bypass.  He has no heart failure

## 2022-04-21 NOTE — Patient Instructions (Signed)
Medication Instructions:  ?Your physician recommends that you continue on your current medications as directed. Please refer to the Current Medication list given to you today. ? ?*If you need a refill on your cardiac medications before your next appointment, please call your pharmacy* ? ? ?Lab Work: ?FASTING lab work a few days before our next appointment with Dr. Rennis Golden  ? ?If you have labs (blood work) drawn today and your tests are completely normal, you will receive your results only by: ?MyChart Message (if you have MyChart) OR ?A paper copy in the mail ?If you have any lab test that is abnormal or we need to change your treatment, we will call you to review the results. ? ? ?Testing/Procedures: ?NONE ? ? ?Follow-Up: ?At [facility], you and your health needs are our priority.  As part of our continuing mission to provide you with exceptional heart care, we have created designated Provider Care Teams.  These Care Teams include your primary Cardiologist (physician) and Advanced Practice Providers (APPs -  Physician Assistants and Nurse Practitioners) who all work together to provide you with the care you need, when you need it. ? ?We recommend signing up for the patient portal called "MyChart".  Sign up information is provided on this After Visit Summary.  MyChart is used to connect with patients for Virtual Visits (Telemedicine).  Patients are able to view lab/test results, encounter notes, upcoming appointments, etc.  Non-urgent messages can be sent to your provider as well.   ?To learn more about what you can do with MyChart, go to [email or web address].   ? ?Your next appointment:   ?July 31, 2022 ?11am ?[facility] ?[address] [place], [place] [address] ?

## 2022-04-21 NOTE — Telephone Encounter (Signed)
Spoke with pt. Pt was notified of lab results and will follow up as planned.  

## 2022-04-23 ENCOUNTER — Other Ambulatory Visit (HOSPITAL_COMMUNITY): Payer: Self-pay

## 2022-04-24 ENCOUNTER — Other Ambulatory Visit (HOSPITAL_COMMUNITY): Payer: Self-pay

## 2022-04-24 ENCOUNTER — Other Ambulatory Visit: Payer: Self-pay | Admitting: Physician Assistant

## 2022-04-25 ENCOUNTER — Other Ambulatory Visit (HOSPITAL_COMMUNITY): Payer: Self-pay

## 2022-04-27 ENCOUNTER — Other Ambulatory Visit: Payer: Self-pay | Admitting: Physician Assistant

## 2022-05-04 ENCOUNTER — Ambulatory Visit: Payer: Medicare (Managed Care) | Admitting: Cardiothoracic Surgery

## 2022-05-14 ENCOUNTER — Other Ambulatory Visit (HOSPITAL_COMMUNITY): Payer: Self-pay

## 2022-05-15 ENCOUNTER — Other Ambulatory Visit: Payer: Self-pay | Admitting: Cardiothoracic Surgery

## 2022-05-15 DIAGNOSIS — Z951 Presence of aortocoronary bypass graft: Secondary | ICD-10-CM

## 2022-05-15 DIAGNOSIS — I5042 Chronic combined systolic (congestive) and diastolic (congestive) heart failure: Secondary | ICD-10-CM | POA: Insufficient documentation

## 2022-05-15 DIAGNOSIS — I4891 Unspecified atrial fibrillation: Secondary | ICD-10-CM | POA: Insufficient documentation

## 2022-05-15 DIAGNOSIS — I5023 Acute on chronic systolic (congestive) heart failure: Secondary | ICD-10-CM | POA: Insufficient documentation

## 2022-05-15 DIAGNOSIS — I5022 Chronic systolic (congestive) heart failure: Secondary | ICD-10-CM | POA: Insufficient documentation

## 2022-05-18 ENCOUNTER — Ambulatory Visit (INDEPENDENT_AMBULATORY_CARE_PROVIDER_SITE_OTHER): Payer: Self-pay | Admitting: Cardiothoracic Surgery

## 2022-05-18 ENCOUNTER — Ambulatory Visit
Admission: RE | Admit: 2022-05-18 | Discharge: 2022-05-18 | Disposition: A | Discharge status: Home / Self Care | Payer: Medicare (Managed Care) | Source: Ambulatory Visit | Attending: Cardiothoracic Surgery | Admitting: Cardiothoracic Surgery

## 2022-05-18 ENCOUNTER — Encounter: Payer: Self-pay | Admitting: Cardiothoracic Surgery

## 2022-05-18 VITALS — BP 124/72 | HR 67 | Resp 20 | Ht 73.0 in | Wt 309.0 lb

## 2022-05-18 DIAGNOSIS — L905 Scar conditions and fibrosis of skin: Secondary | ICD-10-CM

## 2022-05-18 DIAGNOSIS — Z951 Presence of aortocoronary bypass graft: Secondary | ICD-10-CM

## 2022-05-18 DIAGNOSIS — Z9889 Other specified postprocedural states: Secondary | ICD-10-CM | POA: Insufficient documentation

## 2022-05-18 NOTE — Progress Notes (Signed)
HPI:  Patient returns for routine postoperative follow-up having undergone multivessel coronary bypass grafting March 24, 2022.  The patient has chronic lung disease, morbid obesity, and has been on home oxygen at night since before surgery. Postop he is made good progress and is walking 45 minutes to an hour every day.  His overall strength and exercise tolerance is improving. Surgical incisions are well-healed.  No anginal symptoms.  Today a chest x-ray was performed which shows no evidence of postoperative pleural effusion, stable sternal wires and cardiac silhouette.  Patient is being followed by cardiology for lipid control and for treatment of his atrial fibrillation.  Current Outpatient Medications  Medication Sig Dispense Refill   acetaminophen (TYLENOL) 500 MG tablet Take 500 mg by mouth every 6 (six) hours as needed for mild pain or moderate pain.     ALPRAZolam (XANAX) 0.5 MG tablet TAKE 1 TABLET BY MOUTH THREE TIMES DAILYAS NEEDED FOR ANXIETY (Patient taking differently: Take 0.5 mg by mouth 3 (three) times daily as needed for anxiety.) 20 tablet 0   AMBULATORY NON FORMULARY MEDICATION CPAP mask and tubing 1 each 0   AMBULATORY NON FORMULARY MEDICATION Portable oxygen concentrator, 3 L [place].  Please fax to [facility] (Patient taking differently: Inhale 2-3 L into the lungs continuous. Portable oxygen concentrator, 3 L [place].  Please fax to [facility]) 1 each 0   amiodarone (PACERONE) 200 MG tablet Take 2 tablets (400 mg total) by mouth 2 (two) times daily for 2 days, then decrease to 200 mg twice daily for 7 days, then decrease to 200 mg daily 60 tablet 1   apixaban (ELIQUIS) 5 MG TABS tablet Take 1 tablet (5 mg total) by mouth 2 (two) times daily. 180 tablet 3   Artificial Tear Ointment (DRY EYES OP) Place 1 drop into the nose daily as needed (dry eyes).     aspirin EC 81 MG EC tablet Take 1 tablet (81 mg total) by mouth daily. Swallow whole. 30 tablet 11   atorvastatin (LIPITOR) 80  MG tablet Take 1 tablet (80 mg total) by mouth daily. (Patient taking differently: Take 80 mg by mouth at bedtime.) 90 tablet 3   calcium carbonate (TUMS - DOSED IN MG ELEMENTAL CALCIUM) 500 MG chewable tablet Chew 3 tablets by mouth 2 (two) times daily as needed for indigestion or heartburn.     carvedilol (COREG) 3.125 MG tablet Take 1 tablet (3.125 mg total) by mouth 2 (two) times daily. 60 tablet 11   diphenhydramine-acetaminophen (TYLENOL PM) 25-500 MG TABS tablet Take 1 tablet by mouth at bedtime.     furosemide (LASIX) 40 MG tablet Take 40 mg daily as directed. Take an additional 40 mg for weight gain of 3 lb over night or 5 lb weight gain in 1 week. 180 tablet 3   loratadine (CLARITIN) 10 MG tablet Take 10 mg by mouth daily.     Magnesium 400 MG TABS Take 400 mg by mouth 2 (two) times a day. (Patient taking differently: Take 400 mg by mouth at bedtime.) 60 tablet 11   montelukast (SINGULAIR) 10 MG tablet Take 10 mg by mouth at bedtime.     oxymetazoline (AFRIN) 0.05 % nasal spray Place 1 spray into left nostril daily as needed for congestion.     pantoprazole (PROTONIX) 40 MG tablet Take 1 tablet (40 mg total) by mouth daily. 90 tablet 1   potassium chloride (KLOR-CON) 10 MEQ tablet Take 1 tablet (10 mEq total) by mouth daily. 90  tablet 3   TRELEGY ELLIPTA 100-62.5-25 MCG/ACT AEPB Inhale 1 puff into the lungs daily.     No current facility-administered medications for this visit.    Physical Exam       Exam    General- alert and comfortable    Neck- no JVD, no cervical adenopathy palpable, no carotid bruit   Lungs- clear without rales, wheezes   Cor- regular rate and rhythm, no murmur , gallop   Abdomen- soft, non-tender   Extremities - warm, non-tender, minimal edema   Neuro- oriented, appropriate, no focal weakness   Diagnostic Tests: Chest x-ray today personally reviewed showing clear lung fields following CABG.  Impression: Excellent recovery now 2 months postop CABG  x2. Patient knows he should not lift more than 10 to 20 pounds until 3 months after surgery. He understands importance of heart healthy diet and heart healthy lifestyle and compliance with his medicines.  He understands the importance of 30 -40 minutes of walking type activity daily. Plan: Return as needed.  Lovett Sox, MD [facility] [phone]

## 2022-05-19 ENCOUNTER — Other Ambulatory Visit: Payer: Self-pay | Admitting: Physician Assistant

## 2022-05-21 ENCOUNTER — Ambulatory Visit (INDEPENDENT_AMBULATORY_CARE_PROVIDER_SITE_OTHER): Payer: Medicare (Managed Care) | Admitting: Internal Medicine

## 2022-05-21 ENCOUNTER — Telehealth: Payer: Self-pay | Admitting: *Deleted

## 2022-05-21 ENCOUNTER — Encounter: Payer: Self-pay | Admitting: Internal Medicine

## 2022-05-21 VITALS — BP 136/88 | HR 68 | Ht 73.0 in | Wt 313.0 lb

## 2022-05-21 DIAGNOSIS — I480 Paroxysmal atrial fibrillation: Secondary | ICD-10-CM | POA: Diagnosis not present

## 2022-05-21 DIAGNOSIS — I5042 Chronic combined systolic (congestive) and diastolic (congestive) heart failure: Secondary | ICD-10-CM | POA: Diagnosis not present

## 2022-05-21 DIAGNOSIS — I4819 Other persistent atrial fibrillation: Secondary | ICD-10-CM

## 2022-05-21 DIAGNOSIS — I214 Non-ST elevation (NSTEMI) myocardial infarction: Secondary | ICD-10-CM

## 2022-05-21 DIAGNOSIS — R5383 Other fatigue: Secondary | ICD-10-CM

## 2022-05-21 MED ORDER — CARVEDILOL 12.5 MG PO TABS: 12.5000 mg | ORAL_TABLET | Freq: Two times a day (BID) | ORAL | 3 refills | Status: DC

## 2022-05-21 NOTE — Progress Notes (Signed)
ELECTROPHYSIOLOGY CONSULT NOTE  Patient ID: Arthur Church, MRN: [id number], DOB/AGE: 1956-11-04 66 y.o. Admit date: (Not on file) Date of Consult: 05/21/2022  Primary Physician: Virgilio Belling, PA-C Primary Cardiologist: [facility]     Arthur Church is a 66 y.o. male who is being seen today for the evaluation of afib  at the request of [facility].    HPI Arthur Church is a 66 y.o. male  Referred for consideration of atrial fibrillation.   He has a history of congestive heart failure remotely having presented in 2016 with severe LV dysfunction thought to be tachycardia mediated from atrial fibrillation.  I saw him at that time and we  used dofetilide and cardioversion; he had maintained sinus rhythm since then.  He represented 4/23 w recurrent chest pain and underwent catheterization.   was found to have significanT two-vessel disease and failed CTO of his RCA and was then referred to CABG.  Procedure was complicated by atrial fibrillation and his previously prescribed dofetilide was discontinued and amiodarone was initiated DATE TEST EF   3/16 Echo  20-25%   9/16/  Echo  55-60%   4/23 Echo  55-60% LAE 48 mm/m2  4/23 LHC  LADp-m-85%, RCAp 90%   Date Cr K Hgb  4/23 0.76 4.0 11.3         Thromboembolic risk factors (, HTN-1, Vasc disease -1) for a CHADSVASc Score of >=2    Past Medical History:  Diagnosis Date   Chronic systolic CHF (congestive heart failure) (HCC)    a. 2D ECHO (03/06/15) EF 20-25%, diffuse HK. Asc aortic diameter: 67mm. Mild LA dilation, mild RV dilation. Mild RV systolic dysfunction   b. LifeVest placed on 02/2015 admissoin    Coronary artery disease    a. 70% LAD lesion   Elevated TSH    ETOH abuse    Hypertension    Morbid obesity (HCC)    a. BMI ~46   Persistent atrial fibrillation (HCC)    a. newly dx 02/2015 admission. s/p successful TEE/DCCV  b. on Eliquis   Pre-diabetes    a. HgA1c 6.1      Surgical History:  Past Surgical History:  Procedure  Laterality Date   CARDIAC CATHETERIZATION N/A 04/26/2015   Procedure: Left Heart Cath and Coronary Angiography;  Surgeon: Lyn Records, MD;  Location: [facility] INVASIVE CV LAB;  Service: Cardiovascular;  Laterality: N/A;   CARDIOVERSION N/A 03/06/2015   Procedure: CARDIOVERSION;  Surgeon: Laurey Morale, MD;  Location: [facility] ENDOSCOPY;  Service: Cardiovascular;  Laterality: N/A;   CORONARY ANGIOGRAPHY N/A 03/18/2022   Procedure: CORONARY ANGIOGRAPHY;  Surgeon: Kathleene Hazel, MD;  Location: [facility] INVASIVE CV LAB;  Service: Cardiovascular;  Laterality: N/A;   CORONARY ARTERY BYPASS GRAFT N/A 03/24/2022   Procedure: CORONARY ARTERY BYPASS GRAFTING (CABG) TIMES TWO USING LEFT INTERNAL MAMMARY ARTERY AND ENDOSCOPICALLY HARVESTED RIGHT GREATER SAPHENOUS VEIN;  Surgeon: Lovett Sox, MD;  Location: [facility] OR;  Service: Open Heart Surgery;  Laterality: N/A;   ENDOVEIN HARVEST OF GREATER SAPHENOUS VEIN Right 03/24/2022   Procedure: ENDOVEIN HARVEST OF GREATER SAPHENOUS VEIN;  Surgeon: Lovett Sox, MD;  Location: [facility] OR;  Service: Open Heart Surgery;  Laterality: Right;   HERNIA REPAIR     LEFT HEART CATH N/A 03/16/2022   Procedure: Left Heart Cath;  Surgeon: [name], MD;  Location: [facility] INVASIVE CV LAB;  Service: Cardiovascular;  Laterality: N/A;   TEE WITHOUT CARDIOVERSION N/A 03/06/2015   Procedure: TRANSESOPHAGEAL  ECHOCARDIOGRAM (TEE);  Surgeon: Dalton S McLean, MD;  Location: [facility] ENDOSCOPY;  Service: Cardiovascular;  Laterality: N/A;   TEE WITHOUT CARDIOVERSION N/A 05/02/2015   Procedure: TRANSESOPHAGEAL ECHOCARDIOGRAM (TEE);  Surgeon: Brian S Crenshaw, MD;  Location: [facility] ENDOSCOPY;  Service: Cardiovascular;  Laterality: N/A;   TEE WITHOUT CARDIOVERSION N/A 03/24/2022   Procedure: TRANSESOPHAGEAL ECHOCARDIOGRAM (TEE);  Surgeon: Vantrigt, Peter, MD;  Location: [facility] OR;  Service: Open Heart Surgery;  Laterality: N/A;     Home Meds: Current Meds  Medication Sig   ALPRAZolam (XANAX) 0.5 MG tablet TAKE 1  TABLET BY MOUTH THREE TIMES DAILYAS NEEDED FOR ANXIETY   AMBULATORY NON FORMULARY MEDICATION Portable oxygen concentrator, 3 L [place].  Please fax to [facility]   atorvastatin (LIPITOR) 80 MG tablet Take 1 tablet (80 mg total) by mouth daily.   Magnesium 400 MG TABS Take 400 mg by mouth 2 (two) times a day.    Allergies: No Known Allergies  Social History   Socioeconomic History   Marital status: Widowed    Spouse name: Not on file   Number of children: Not on file   Years of education: Not on file   Highest education level: Not on file  Occupational History   Occupation: security guard  Tobacco Use   Smoking status: Former    Types: Cigarettes    Quit date: 01/02/2013    Years since quitting: 9.3   Smokeless tobacco: Never   Tobacco comments:    smokes about a week  Substance and Sexual Activity   Alcohol use: Yes    Alcohol/week: 0.0 standard drinks of alcohol   Drug use: No   Sexual activity: Not Currently  Other Topics Concern   Not on file  Social History Narrative   Mr Eidem 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 He reports he is independent with all his care needs and transportation to medical appointments   He is stating he is to either retire or go out on disability soon.    His daughter, Jennier is a SW per pt   Social Determinants of Health   Financial Resource Strain: Not on file  Food Insecurity: Not on file  Transportation Needs: Not on file  Physical Activity: Not on file  Stress: Not on file  Social Connections: Not on file  Intimate Partner Violence: Not on file     Family History  Problem Relation Age of Onset   Uterine cancer Sister    Heart attack Mother    Breast cancer Mother    Sleep apnea Brother    Heart attack Maternal Grandfather    Sleep apnea Brother      ROS:  Please see the history of present illness.     All other systems reviewed and negative.    Physical Exam:  Blood pressure 136/88, pulse  68, height 6' 1" (1.854 m), weight (!) 313 lb (142 kg), SpO2 93 %. General: Well developed, Morbidly obese male in no acute distress. Head: Normocephalic, atraumatic, sclera non-icteric, no xanthomas, nares are without discharge. EENT: normal  Lymph Nodes:  none Neck: Negative for carotid bruits. JVD 8-10 +HJR Back:without scoliosis kyphosis Lungs: Clear bilaterally to auscultation without wheezes, rales, or rhonchi. Breathing is unlabored. Heart: Irregular rate and rhythm with S1 S2. No   murmur . No rubs, or gallops appreciated. Abdomen: Soft, non-tender, non-distended with normoactive bowel sounds. No hepatomegaly. No rebound/guarding. No obvious abdominal masses. Msk:    Strength and tone appear normal for age. Extremities: No clubbing or cyanosis.  2+ right greater than left edema.  Distal pedal pulses are 2+ and equal bilaterally. Skin: Warm and Dry Neuro: Alert and oriented X 3. CN III-XII intact Grossly normal sensory and motor function . Psych:  Responds to questions appropriately with a normal affect.        EKG: Atrial fibrillation at 68 beats intervals-/10/42 04/20/2022 atrial fibrillation  Assessment and Plan:  Atrial fibrillation-persistent  Ischemic heart disease with midrange ejection fraction  Left atrial enlargement-severe  Morbid obesity with interval 80 pound weight loss  Obstructive sleep apnea not using CPAP  Congestive heart failure acute/chronic class IIb-III a  High risk medication surveillance-amiodarone   The patient has congestive heart failure with volume overload and persistent atrial fibrillation.  He was treated with amiodarone in hospital; we will undertake cardioversion.  We will have him follow-up with the A-fib clinic about 4 weeks after that, and he will be getting wearable technology to try to track his sinus versus atrial fibrillation rhythms.  We will continue him on amiodarone for the time being hopefully we can get him back in rhythm and  there with he might have some improvement in left atrial size.  We will need to reassess this in about 4 months or so post cardioversion and if we are able to accomplish that, I would be inclined towards discontinuing his amiodarone washes out and see if we could resume dofetilide and or consider catheter ablation.  In regards to the latter, he he needs to lose weight and we also need to get him back on his CPAP.  Part of the reason I think he is not tolerating his CPAP is because he is volume overloaded.  We will increase his furosemide from 40 daily to 80 milligrams alternating with 40 mg x 5 total doses       Sherryl Manges

## 2022-05-21 NOTE — Telephone Encounter (Signed)
Pt was seen in the office today by [name] who ordered an [place] Sleep Study. Nilda Calamity RN, Clinic Supervisor, set up the pt today with the study and has reviewed all instructions with the pt. Pt has signed waiver and is in agreement to signed waive.   We will call the pt with PIN # once he has been approved through his insurance.

## 2022-05-21 NOTE — H&P (View-Only) (Signed)
ELECTROPHYSIOLOGY CONSULT NOTE  Patient ID: Arthur Church, MRN: [id number], DOB/AGE: 1956-11-04 66 y.o. Admit date: (Not on file) Date of Consult: 05/21/2022  Primary Physician: Arthur Belling, PA-C Primary Cardiologist: Arthur Church     Arthur Church is a 66 y.o. male who is being seen today for the evaluation of afib  at the request of Arthur Church.    HPI Arthur Church is a 66 y.o. male  Referred for consideration of atrial fibrillation.   He has a history of congestive heart failure remotely having presented in 2016 with severe LV dysfunction thought to be tachycardia mediated from atrial fibrillation.  I saw him at that time and we  used dofetilide and cardioversion; he had maintained sinus rhythm since then.  He represented 4/23 w recurrent chest pain and underwent catheterization.   was found to have significanT two-vessel disease and failed CTO of his RCA and was then referred to CABG.  Procedure was complicated by atrial fibrillation and his previously prescribed dofetilide was discontinued and amiodarone was initiated DATE TEST EF   3/16 Echo  20-25%   9/16/  Echo  55-60%   4/23 Echo  55-60% LAE 48 mm/m2  4/23 LHC  LADp-m-85%, RCAp 90%   Date Cr K Hgb  4/23 0.76 4.0 11.3         Thromboembolic risk factors (, HTN-1, Vasc disease -1) for a CHADSVASc Score of >=2    Past Medical History:  Diagnosis Date   Chronic systolic CHF (congestive heart failure) (HCC)    a. 2D ECHO (03/06/15) EF 20-25%, diffuse HK. Asc aortic diameter: 67mm. Mild LA dilation, mild RV dilation. Mild RV systolic dysfunction   b. LifeVest placed on 02/2015 admissoin    Coronary artery disease    a. 70% LAD lesion   Elevated TSH    ETOH abuse    Hypertension    Morbid obesity (HCC)    a. BMI ~46   Persistent atrial fibrillation (HCC)    a. newly dx 02/2015 admission. s/p successful TEE/DCCV  b. on Eliquis   Pre-diabetes    a. HgA1c 6.1      Surgical History:  Past Surgical History:  Procedure  Laterality Date   CARDIAC CATHETERIZATION N/A 04/26/2015   Procedure: Left Heart Cath and Coronary Angiography;  Surgeon: Arthur Records, MD;  Location: [facility] INVASIVE CV LAB;  Service: Cardiovascular;  Laterality: N/A;   CARDIOVERSION N/A 03/06/2015   Procedure: CARDIOVERSION;  Surgeon: Arthur Morale, MD;  Location: [facility] ENDOSCOPY;  Service: Cardiovascular;  Laterality: N/A;   CORONARY ANGIOGRAPHY N/A 03/18/2022   Procedure: CORONARY ANGIOGRAPHY;  Surgeon: Arthur Hazel, MD;  Location: [facility] INVASIVE CV LAB;  Service: Cardiovascular;  Laterality: N/A;   CORONARY ARTERY BYPASS GRAFT N/A 03/24/2022   Procedure: CORONARY ARTERY BYPASS GRAFTING (CABG) TIMES TWO USING LEFT INTERNAL MAMMARY ARTERY AND ENDOSCOPICALLY HARVESTED RIGHT GREATER SAPHENOUS VEIN;  Surgeon: Arthur Sox, MD;  Location: [facility] OR;  Service: Open Heart Surgery;  Laterality: N/A;   ENDOVEIN HARVEST OF GREATER SAPHENOUS VEIN Right 03/24/2022   Procedure: ENDOVEIN HARVEST OF GREATER SAPHENOUS VEIN;  Surgeon: Arthur Sox, MD;  Location: [facility] OR;  Service: Open Heart Surgery;  Laterality: Right;   HERNIA REPAIR     LEFT HEART CATH N/A 03/16/2022   Procedure: Left Heart Cath;  Surgeon: Arthur Cobb, MD;  Location: [facility] INVASIVE CV LAB;  Service: Cardiovascular;  Laterality: N/A;   TEE WITHOUT CARDIOVERSION N/A 03/06/2015   Procedure: TRANSESOPHAGEAL  ECHOCARDIOGRAM (TEE);  Surgeon: Arthur Morale, MD;  Location: [facility] ENDOSCOPY;  Service: Cardiovascular;  Laterality: N/A;   TEE WITHOUT CARDIOVERSION N/A 05/02/2015   Procedure: TRANSESOPHAGEAL ECHOCARDIOGRAM (TEE);  Surgeon: Arthur Bunting, MD;  Location: [facility] ENDOSCOPY;  Service: Cardiovascular;  Laterality: N/A;   TEE WITHOUT CARDIOVERSION N/A 03/24/2022   Procedure: TRANSESOPHAGEAL ECHOCARDIOGRAM (TEE);  Surgeon: Arthur Sox, MD;  Location: [facility] OR;  Service: Open Heart Surgery;  Laterality: N/A;     Home Meds: Current Meds  Medication Sig   ALPRAZolam (XANAX) 0.5 MG tablet TAKE 1  TABLET BY MOUTH THREE TIMES DAILYAS NEEDED FOR ANXIETY   AMBULATORY NON FORMULARY MEDICATION Portable oxygen concentrator, 3 L Arthur Church.  Please fax to [facility]   atorvastatin (LIPITOR) 80 MG tablet Take 1 tablet (80 mg total) by mouth daily.   Magnesium 400 MG TABS Take 400 mg by mouth 2 (two) times a day.    Allergies: No Known Allergies  Social History   Socioeconomic History   Marital status: Widowed    Spouse name: Not on file   Number of children: Not on file   Years of education: Not on file   Highest education level: Not on file  Occupational History   Occupation: security guard  Tobacco Use   Smoking status: Former    Types: Cigarettes    Quit date: 01/02/2013    Years since quitting: 9.3   Smokeless tobacco: Never   Tobacco comments:    smokes about a week  Substance and Sexual Activity   Alcohol use: Yes    Alcohol/week: 0.0 standard drinks of alcohol   Drug use: No   Sexual activity: Not Currently  Other Topics Concern   Not on file  Social History Narrative   Arthur Church 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 He reports he is independent with all his care needs and transportation to medical appointments   He is stating he is to either retire or go out on disability soon.    His daughter, Arthur Church is a SW per pt   Social Determinants of Corporate investment banker Strain: Not on file  Food Insecurity: Not on file  Transportation Needs: Not on file  Physical Activity: Not on file  Stress: Not on file  Social Connections: Not on file  Intimate Partner Violence: Not on file     Family History  Problem Relation Age of Onset   Uterine cancer Sister    Heart attack Mother    Breast cancer Mother    Sleep apnea Brother    Heart attack Maternal Grandfather    Sleep apnea Brother      ROS:  Please see the history of present illness.     All other systems reviewed and negative.    Physical Exam:  Blood pressure 136/88, pulse  68, height 6\' 1"  (1.854 m), weight (!) 313 lb (142 kg), SpO2 93 %. General: Well developed, Morbidly obese male in no acute distress. Head: Normocephalic, atraumatic, sclera non-icteric, no xanthomas, nares are without discharge. EENT: normal  Lymph Nodes:  none Neck: Negative for carotid bruits. JVD 8-10 +HJR Back:without scoliosis kyphosis Lungs: Clear bilaterally to auscultation without wheezes, rales, or rhonchi. Breathing is unlabored. Heart: Irregular rate and rhythm with S1 S2. No   murmur . No rubs, or gallops appreciated. Abdomen: Soft, non-tender, non-distended with normoactive bowel sounds. No hepatomegaly. No rebound/guarding. No obvious abdominal masses. Msk:  Strength and tone appear normal for age. Extremities: No clubbing or cyanosis.  2+ right greater than left edema.  Distal pedal pulses are 2+ and equal bilaterally. Skin: Warm and Dry Neuro: Alert and oriented X 3. CN III-XII intact Grossly normal sensory and motor function . Psych:  Responds to questions appropriately with a normal affect.        EKG: Atrial fibrillation at 68 beats intervals-/10/42 04/20/2022 atrial fibrillation  Assessment and Plan:  Atrial fibrillation-persistent  Ischemic heart disease with midrange ejection fraction  Left atrial enlargement-severe  Morbid obesity with interval 80 pound weight loss  Obstructive sleep apnea not using CPAP  Congestive heart failure acute/chronic class IIb-III a  High risk medication surveillance-amiodarone   The patient has congestive heart failure with volume overload and persistent atrial fibrillation.  He was treated with amiodarone in hospital; we will undertake cardioversion.  We will have him follow-up with the A-fib clinic about 4 weeks after that, and he will be getting wearable technology to try to track his sinus versus atrial fibrillation rhythms.  We will continue him on amiodarone for the time being hopefully we can get him back in rhythm and  there with he might have some improvement in left atrial size.  We will need to reassess this in about 4 months or so post cardioversion and if we are able to accomplish that, I would be inclined towards discontinuing his amiodarone washes out and see if we could resume dofetilide and or consider catheter ablation.  In regards to the latter, he he needs to lose weight and we also need to get him back on his CPAP.  Part of the reason I think he is not tolerating his CPAP is because he is volume overloaded.  We will increase his furosemide from 40 daily to 80 milligrams alternating with 40 mg x 5 total doses       Sherryl Manges

## 2022-05-21 NOTE — Patient Instructions (Addendum)
Medication Instructions:  Increase your Coreg 6.25 mg two times a day for 10 days then 12.5 mg two times a day Increase Lasix to 80 mg every other day for the next 10 days, total of 5 increased doses. Your physician recommends that you continue on your current medications as directed. Please refer to the Current Medication list given to you today. *If you need a refill on your cardiac medications before your next appointment, please call your pharmacy*  Lab Work: TSH, CMP If you have labs (blood work) drawn today and your tests are completely normal, you will receive your results only by: MyChart Message (if you have MyChart) OR A paper copy in the mail If you have any lab test that is abnormal or we need to change your treatment, we will call you to review the results.  Testing/Procedures: Your physician has recommended that you have a sleep study. This test records several body functions during sleep, including: brain activity, eye movement, oxygen and carbon dioxide blood levels, heart rate and rhythm, breathing rate and rhythm, the flow of air through your mouth and nose, snoring, body muscle movements, and chest and belly movement.   Follow-Up: At [facility], you and your health needs are our priority.  As part of our continuing mission to provide you with exceptional heart care, we have created designated Provider Care Teams.  These Care Teams include your primary Cardiologist (physician) and Advanced Practice Providers (APPs -  Physician Assistants and Nurse Practitioners) who all work together to provide you with the care you need, when you need it.  Your physician wants you to follow-up in: 4 weeks post Cardioversion with Afib Clinic (June 20) 6 months with Sherryl Manges, MD   We recommend signing up for the patient portal called "MyChart".  Sign up information is provided on this After Visit Summary.  MyChart is used to connect with patients for Virtual Visits (Telemedicine).   Patients are able to view lab/test results, encounter notes, upcoming appointments, etc.  Non-urgent messages can be sent to your provider as well.   To learn more about what you can do with MyChart, go to [email or web address].    Any Other Special Instructions Will Be Listed Below (If Applicable).  You are scheduled for a Cardioversion on June 02, 2022 with Dr. Elease Hashimoto.  Please arrive at the [facility] (Main Entrance A) at [facility]: [address] [place], [place] [address] at 8 am.   DIET: Nothing to eat or drink after midnight except a sip of water with medications (see medication instructions below)   Medication Instructions: Hold Lasix  Continue your anticoagulant: Eliquis You will need to continue your anticoagulant after your procedure until you  are told by your  Provider that it is safe to stop   You must have a responsible person to drive you home and stay in the waiting area during your procedure. Failure to do so could result in cancellation.  Bring your insurance cards.  *Special Note: Every effort is made to have your procedure done on time. Occasionally there are emergencies that occur at the hospital that may cause delays. Please be patient if a delay does occur.

## 2022-05-22 ENCOUNTER — Telehealth (HOSPITAL_COMMUNITY): Payer: Self-pay

## 2022-05-22 ENCOUNTER — Encounter (HOSPITAL_COMMUNITY): Payer: Self-pay | Admitting: Cardiovascular Disease

## 2022-05-22 LAB — COMPREHENSIVE METABOLIC PANEL
ALT: 10 IU/L (ref 0–44)
AST: 15 IU/L (ref 0–40)
Albumin/Globulin Ratio: 1.7 (ref 1.2–2.2)
Albumin: 4.3 g/dL (ref 3.8–4.8)
Alkaline Phosphatase: 78 IU/L (ref 44–121)
BUN/Creatinine Ratio: 20 (ref 10–24)
BUN: 17 mg/dL (ref 8–27)
Bilirubin Total: 0.7 mg/dL (ref 0.0–1.2)
CO2: 31 mmol/L — ABNORMAL HIGH (ref 20–29)
Calcium: 9.6 mg/dL (ref 8.6–10.2)
Chloride: 98 mmol/L (ref 96–106)
Creatinine, Ser: 0.86 mg/dL (ref 0.76–1.27)
Globulin, Total: 2.5 g/dL (ref 1.5–4.5)
Glucose: 112 mg/dL — ABNORMAL HIGH (ref 70–99)
Potassium: 4.6 mmol/L (ref 3.5–5.2)
Sodium: 137 mmol/L (ref 134–144)
Total Protein: 6.8 g/dL (ref 6.0–8.5)
eGFR: 97 mL/min/{1.73_m2} (ref >59)

## 2022-05-22 LAB — TSH: TSH: 3.14 u[IU]/mL (ref 0.450–4.500)

## 2022-05-22 NOTE — Telephone Encounter (Signed)
Pt insurance is active and benefits verified through [facility] Co-pay $10, DED 0/0 met, out of pocket $3,900/$319.80 met, co-insurance 0%. no pre-authorization for codes [phone] and [phone] pre-authorization is required for G0422 and [phone]. Dienne/[employer] 05/22/2022_0 :53am, REF# [id number]   How many CR sessions are covered? (36 sessions for TCR, 72 sessions for ICR)72 Is this a lifetime maximum or an annual maximum? lifetime Has the member used any of these services to date? no Is there a time limit (weeks/months) on start of program and/or program completion? no     Will contact patient to see if he is interested in the Cardiac Rehab Program. If interested, patient will need to complete follow up appt. Once completed, patient will be contacted for scheduling upon review by the RN Navigator.

## 2022-05-22 NOTE — Telephone Encounter (Signed)
Pt is not interested in the cardiac rehab program. Closed referral 

## 2022-05-26 NOTE — Telephone Encounter (Signed)
Prior Authorization for [employer] sent to [employer] via Phone.  APPROVED/ NO PA REQ/PER ANTWONETT M

## 2022-05-27 NOTE — Telephone Encounter (Signed)
Patient returned CMA's call regarding the sleep study.  He believed his insurance denied the sleep study request.  Please call back to follow-up.

## 2022-05-27 NOTE — Telephone Encounter (Signed)
DPR ok to leave message. Left detailed message he has been approved for Itamar sleep study. Left message with PIN# [id number]. If pt may proceed with study between tonight or by the weekend. If not please call the office let us know when sleep study can be done.

## 2022-05-27 NOTE — Telephone Encounter (Signed)
I called the pt back and he tells me that he got a letter stating something about his CPAP not approved. Pt said he is not home right now so he will need to read the letter and call us back.   We will wait to hear back from the pt.

## 2022-05-28 NOTE — Telephone Encounter (Signed)
See previous notes from yesterday. Pt called back today and said the le tter he got was pertaining to his CPAP equipment and not the Itamar study. Pt agreeable to proceed with Itamar study. PIN # [id number] has been given to the pt today. Pt said he will do the sleep study sometime between tonight and over the weekend. Pt thanked me for the help.   Called and made the patient aware that he may proceed with the [facility] Sleep Study. PIN # provided to the patient. Patient made aware that he will be contacted after the test has been read with the results and any recommendations. Patient verbalized understanding and thanked me for the call.

## 2022-05-30 ENCOUNTER — Encounter (INDEPENDENT_AMBULATORY_CARE_PROVIDER_SITE_OTHER): Payer: Medicare (Managed Care) | Admitting: Cardiology

## 2022-05-30 DIAGNOSIS — R0683 Snoring: Secondary | ICD-10-CM | POA: Diagnosis not present

## 2022-05-30 DIAGNOSIS — I480 Paroxysmal atrial fibrillation: Secondary | ICD-10-CM | POA: Diagnosis not present

## 2022-06-01 ENCOUNTER — Telehealth: Payer: Self-pay | Admitting: Cardiology

## 2022-06-01 NOTE — Procedures (Signed)
   SLEEP STUDY REPORT Patient Information Study Date: 05/30/22 Patient Name: Arthur Church Patient ID: [id number] Birth Date: May 26, 2056 Age: 66 Gender: Male BMI: 41.5 (W=313 lb, H=6' 1'') Referring Physician: Sherryl Manges, MD  TEST DESCRIPTION: Home sleep apnea testing was completed using the WatchPat, a Type 1 device, utilizing peripheral arterial tonometry (PAT), chest movement, actigraphy, pulse oximetry, pulse rate, body position and snore. AHI was calculated with apnea and hypopnea using valid sleep time as the denominator. RDI includes apneas, hypopneas, and RERAs. The data acquired and the scoring of sleep and all associated events were performed in accordance with the recommended standards and specifications as outlined in the AASM Manual for the Scoring of Sleep and Associated Events 2.2.0 (2015).  FINDINGS: 1. No evidence of Obstructive Sleep Apnea with AHI 1.8/hr. 2. No Central Sleep Apnea. 3. Oxygen desaturations as low as 84%. 4. Severe snoring was present. O2 sats were < 88% for 1.1 minutes. 5. Total sleep time was 5 hrs and 27 min. 6. 11.2% of total sleep time was spent in REM sleep. 7. Prolonged sleep onset latency at 28 min. 8. Normal REM sleep onset latency at 90 min. 9. Total awakenings were 6.  DIAGNOSIS: Normal study with no significant sleep disordered breathing.  RECOMMENDATIONS: 1. Normal study with no significant sleep disordered breathing.  2. Healthy sleep recommendations include: adequate nightly sleep (normal 7-9 hrs/night), avoidance of caffeine after noon and alcohol near bedtime, and maintaining a sleep environment that is cool, dark and quiet.  3. Weight loss for overweight patients is recommended.  4. Snoring recommendations include: weight loss where appropriate, side sleeping, and avoidance of alcohol before bed.  5. Operation of motor vehicle or dangerous equipment must be avoided when feeling drowsy, excessively sleepy, or mentally  fatigued.  6. An ENT consultation which may be useful for specific causes of and possible treatment of bothersome snoring .  7. Weight loss may be of benefit in reducing the severity of snoring.   Signature: Electronically Signed: 06/01/22 Armanda Magic, MD; [facility]; Diplomat, American Board of Sleep Medicine

## 2022-06-01 NOTE — Telephone Encounter (Signed)
Patient is calling requesting his sleep study results. Please advise.

## 2022-06-01 NOTE — Telephone Encounter (Signed)
Pt already notified and study has been done.

## 2022-06-01 NOTE — Telephone Encounter (Signed)
APPROVED, Berkley Harvey #[id number] VALID 05/26/22--11/22/22.

## 2022-06-02 ENCOUNTER — Other Ambulatory Visit: Payer: Self-pay

## 2022-06-02 ENCOUNTER — Ambulatory Visit (HOSPITAL_BASED_OUTPATIENT_CLINIC_OR_DEPARTMENT_OTHER): Payer: Medicare (Managed Care) | Admitting: Anesthesiology

## 2022-06-02 ENCOUNTER — Ambulatory Visit (HOSPITAL_COMMUNITY)
Admission: RE | Admit: 2022-06-02 | Discharge: 2022-06-02 | Disposition: A | Discharge status: Home / Self Care | Payer: Medicare (Managed Care) | Attending: Cardiovascular Disease | Admitting: Cardiovascular Disease

## 2022-06-02 ENCOUNTER — Encounter (HOSPITAL_COMMUNITY): Payer: Self-pay | Admitting: Cardiovascular Disease

## 2022-06-02 ENCOUNTER — Ambulatory Visit (HOSPITAL_COMMUNITY): Payer: Medicare (Managed Care) | Admitting: Anesthesiology

## 2022-06-02 ENCOUNTER — Encounter (HOSPITAL_COMMUNITY)
Admission: RE | Disposition: A | Discharge status: Home / Self Care | Payer: Self-pay | Source: Home / Self Care | Attending: Cardiovascular Disease

## 2022-06-02 DIAGNOSIS — Z6839 Body mass index (BMI) 39.0-39.9, adult: Secondary | ICD-10-CM | POA: Diagnosis not present

## 2022-06-02 DIAGNOSIS — I4891 Unspecified atrial fibrillation: Secondary | ICD-10-CM | POA: Diagnosis not present

## 2022-06-02 DIAGNOSIS — I110 Hypertensive heart disease with heart failure: Secondary | ICD-10-CM

## 2022-06-02 DIAGNOSIS — Z79899 Other long term (current) drug therapy: Secondary | ICD-10-CM | POA: Insufficient documentation

## 2022-06-02 DIAGNOSIS — I2510 Atherosclerotic heart disease of native coronary artery without angina pectoris: Secondary | ICD-10-CM | POA: Insufficient documentation

## 2022-06-02 DIAGNOSIS — Z951 Presence of aortocoronary bypass graft: Secondary | ICD-10-CM | POA: Insufficient documentation

## 2022-06-02 DIAGNOSIS — I252 Old myocardial infarction: Secondary | ICD-10-CM | POA: Diagnosis not present

## 2022-06-02 DIAGNOSIS — I5022 Chronic systolic (congestive) heart failure: Secondary | ICD-10-CM | POA: Diagnosis not present

## 2022-06-02 DIAGNOSIS — I4819 Other persistent atrial fibrillation: Secondary | ICD-10-CM | POA: Diagnosis present

## 2022-06-02 DIAGNOSIS — Z87891 Personal history of nicotine dependence: Secondary | ICD-10-CM | POA: Diagnosis not present

## 2022-06-02 DIAGNOSIS — G4733 Obstructive sleep apnea (adult) (pediatric): Secondary | ICD-10-CM | POA: Diagnosis not present

## 2022-06-02 HISTORY — PX: CARDIOVERSION: SHX1299

## 2022-06-02 SURGERY — CARDIOVERSION
Anesthesia: General

## 2022-06-02 MED ORDER — LIDOCAINE 2% (20 MG/ML) 5 ML SYRINGE
INTRAMUSCULAR | Status: DC | PRN
  Administered 2022-06-02: 100 mg via INTRAVENOUS

## 2022-06-02 MED ORDER — SODIUM CHLORIDE 0.9 % IV SOLN: INTRAVENOUS | Status: DC

## 2022-06-02 MED ORDER — PROPOFOL 10 MG/ML IV BOLUS
INTRAVENOUS | Status: DC | PRN
  Administered 2022-06-02: 100 mg via INTRAVENOUS

## 2022-06-02 NOTE — Anesthesia Preprocedure Evaluation (Signed)
Anesthesia Evaluation    Reviewed: NPO status , Patient's Chart, lab work & pertinent test results  History of Anesthesia Complications Negative for: history of anesthetic complications  Airway Mallampati: II  TM Distance: >3 FB Neck ROM: Full    Dental no notable dental hx.    Pulmonary sleep apnea , COPD, former smoker,    Pulmonary exam normal breath sounds clear to auscultation       Cardiovascular hypertension, Pt. on medications and Pt. on home beta blockers + CAD, + Past MI, + CABG and +CHF  Normal cardiovascular exam+ dysrhythmias Atrial Fibrillation  Rhythm:Regular Rate:Normal     Neuro/Psych negative neurological ROS  negative psych ROS   GI/Hepatic negative GI ROS, Neg liver ROS,   Endo/Other  Morbid obesity  Renal/GU negative Renal ROS     Musculoskeletal   Abdominal (+) + obese,   Peds  Hematology   Anesthesia Other Findings   Reproductive/Obstetrics                             Anesthesia Physical  Anesthesia Plan  ASA: III  Anesthesia Plan: General   Post-op Pain Management: Minimal or no pain anticipated   Induction: Intravenous  PONV Risk Score and Plan: 2 and Propofol infusion and Treatment may vary due to age or medical condition  Airway Management Planned: Mask  Additional Equipment:   Intra-op Plan:   Post-operative Plan:   Informed Consent:   Plan Discussed with:   Anesthesia Plan Comments:         Anesthesia Quick Evaluation

## 2022-06-02 NOTE — Discharge Instructions (Signed)

## 2022-06-02 NOTE — Transfer of Care (Signed)
Immediate Anesthesia Transfer of Care Note  Patient: Arthur Church  Procedure(s) Performed: CARDIOVERSION  Patient Location: Endoscopy Unit  Anesthesia Type:General  Level of Consciousness: awake and drowsy  Airway & Oxygen Therapy: Patient Spontanous Breathing and Patient connected to nasal cannula oxygen  Post-op Assessment: Report given to RN and Post -op Vital signs reviewed and stable  Post vital signs: Reviewed and stable  Last Vitals:  Vitals Value Taken Time  BP 125/62 (84)   Temp    Pulse 72   Resp 14   SpO2 94     Last Pain:  Vitals:   06/02/22 0859  TempSrc: Temporal  PainSc: 0-No pain         Complications: No notable events documented.

## 2022-06-02 NOTE — CV Procedure (Signed)
    Cardioversion Note  Arthur Church [id number] 08-27-1956  Procedure: DC Cardioversion Indications: atrial fib   Procedure Details Consent: Obtained Time Out: Verified patient identification, verified procedure, site/side was marked, verified correct patient position, special equipment/implants available, Radiology Safety Procedures followed,  medications/allergies/relevent history reviewed, required imaging and test results available.  Performed  The patient has been on adequate anticoagulation.  The patient received IV Lidocaine 100 mg IV followed by Proporol 100 mg IV  for sedation.  Synchronous cardioversion was performed at 200  joules.  The cardioversion was successful .     Complications: No apparent complications Patient did tolerate procedure well.   Vesta Mixer, Montez Hageman., MD, [facility] 06/02/2022, 10:02 AM

## 2022-06-02 NOTE — Interval H&P Note (Signed)
History and Physical Interval Note:  06/02/2022 9:48 AM  Arthur Church  has presented today for surgery, with the diagnosis of AFIB.  The various methods of treatment have been discussed with the patient and family. After consideration of risks, benefits and other options for treatment, the patient has consented to  Procedure(s): CARDIOVERSION (N/A) as a surgical intervention.  The patient's history has been reviewed, patient examined, no change in status, stable for surgery.  I have reviewed the patient's chart and labs.  Questions were answered to the patient's satisfaction.     Kristeen Miss

## 2022-06-02 NOTE — Anesthesia Procedure Notes (Signed)
Procedure Name: General with mask airway Date/Time: 06/02/2022 9:43 AM  Performed by: Maxine Glenn, CRNAPre-anesthesia Checklist: Patient identified, Emergency Drugs available, Suction available and Patient being monitored Patient Re-evaluated:Patient Re-evaluated prior to induction Oxygen Delivery Method: Ambu bag Preoxygenation: Pre-oxygenation with 100% oxygen Induction Type: IV induction Dental Injury: Teeth and Oropharynx as per pre-operative assessment

## 2022-06-03 ENCOUNTER — Encounter (HOSPITAL_COMMUNITY): Payer: Self-pay | Admitting: Cardiovascular Disease

## 2022-06-03 NOTE — Anesthesia Postprocedure Evaluation (Signed)
Anesthesia Post Note  Patient: Arthur Church  Procedure(s) Performed: CARDIOVERSION     Patient location during evaluation: PACU Anesthesia Type: General Level of consciousness: awake and alert Pain management: pain level controlled Vital Signs Assessment: post-procedure vital signs reviewed and stable Respiratory status: spontaneous breathing, nonlabored ventilation and respiratory function stable Cardiovascular status: blood pressure returned to baseline and stable Postop Assessment: no apparent nausea or vomiting Anesthetic complications: no   No notable events documented.  Last Vitals:  Vitals:   06/02/22 1014 06/02/22 1028  BP: 140/64 140/64  Pulse: 74 72  Resp: 18 15  Temp:    SpO2: 97% 96%    Last Pain:  Vitals:   06/02/22 1014  TempSrc:   PainSc: 0-No pain                 Lowella Curb

## 2022-06-04 ENCOUNTER — Ambulatory Visit: Payer: Medicare (Managed Care)

## 2022-06-04 ENCOUNTER — Telehealth: Payer: Self-pay | Admitting: *Deleted

## 2022-06-04 DIAGNOSIS — I4819 Other persistent atrial fibrillation: Secondary | ICD-10-CM

## 2022-06-04 DIAGNOSIS — I214 Non-ST elevation (NSTEMI) myocardial infarction: Secondary | ICD-10-CM

## 2022-06-04 DIAGNOSIS — I5042 Chronic combined systolic (congestive) and diastolic (congestive) heart failure: Secondary | ICD-10-CM

## 2022-06-04 DIAGNOSIS — I480 Paroxysmal atrial fibrillation: Secondary | ICD-10-CM

## 2022-06-04 DIAGNOSIS — R5383 Other fatigue: Secondary | ICD-10-CM

## 2022-06-04 NOTE — Telephone Encounter (Signed)
-----   Message from Gaynelle Cage, [place] sent at 06/02/2022  8:28 AM EDT -----  ----- Message ----- From: Quintella Reichert, MD Sent: 06/01/2022   6:08 PM EDT To: [facility] Sleep Studies  Normal home sleep study so in lab PSG will be ordered

## 2022-06-04 NOTE — Telephone Encounter (Signed)
The patient has been notified of the result and verbalized understanding.  All questions (if any) were answered. Arthur Church, CMA 06/04/2022 10:35 AM     Pt is aware and agreeable to normal results. Patient declines more testing.

## 2022-06-04 NOTE — Telephone Encounter (Signed)
The patient has been notified of the result and verbalized understanding.  All questions (if any) were answered. Arthur Church, CMA 06/04/2022 10:35 AM     Pt is aware and agreeable to normal results. Patient declines more testing. 

## 2022-06-30 ENCOUNTER — Ambulatory Visit (HOSPITAL_COMMUNITY)
Admission: RE | Admit: 2022-06-30 | Discharge: 2022-06-30 | Disposition: A | Discharge status: Home / Self Care | Payer: Medicare (Managed Care) | Source: Ambulatory Visit | Attending: Nurse Practitioner | Admitting: Nurse Practitioner

## 2022-06-30 ENCOUNTER — Encounter (HOSPITAL_COMMUNITY): Payer: Self-pay | Admitting: Nurse Practitioner

## 2022-06-30 VITALS — BP 132/68 | HR 75 | Ht 73.0 in | Wt 323.4 lb

## 2022-06-30 DIAGNOSIS — I4819 Other persistent atrial fibrillation: Secondary | ICD-10-CM | POA: Diagnosis not present

## 2022-06-30 DIAGNOSIS — I4892 Unspecified atrial flutter: Secondary | ICD-10-CM | POA: Insufficient documentation

## 2022-06-30 DIAGNOSIS — D6869 Other thrombophilia: Secondary | ICD-10-CM | POA: Diagnosis not present

## 2022-06-30 DIAGNOSIS — Z951 Presence of aortocoronary bypass graft: Secondary | ICD-10-CM | POA: Insufficient documentation

## 2022-06-30 DIAGNOSIS — Z7901 Long term (current) use of anticoagulants: Secondary | ICD-10-CM | POA: Insufficient documentation

## 2022-06-30 DIAGNOSIS — I2510 Atherosclerotic heart disease of native coronary artery without angina pectoris: Secondary | ICD-10-CM | POA: Diagnosis not present

## 2022-06-30 MED ORDER — AMIODARONE HCL 200 MG PO TABS: 200.0000 mg | ORAL_TABLET | Freq: Every morning | ORAL | Status: DC

## 2022-06-30 MED ORDER — CARVEDILOL 12.5 MG PO TABS
12.5000 mg | ORAL_TABLET | Freq: Two times a day (BID) | ORAL | Status: DC
Start: 2022-06-30 — End: 2023-03-30

## 2022-06-30 NOTE — H&P (View-Only) (Signed)
Primary Care Physician: Chyrel Masson Referring Physician: [name]  Cardiologist: Dr. Marguarite Arbour is a 66 y.o. male with a h/o afib on Tikosyn that had CABG in April and had afib at that time. His longstanding tikosyn was stopped and he was placed on amiodarone. He was seen by  [name] in June for return of afib and had a successful cardioversion 6/20 but back in atrial flutter with 4:1 conduction today. He feels fatigue but no palpitations. He states that he came down with an URI ( that he caught from one of the grand kids) right after the cardioversion. He is still coughing but much improved and mucus is clear.    Today, he denies symptoms of palpitations, chest pain, shortness of breath, orthopnea, PND, lower extremity edema, dizziness, presyncope, syncope, or neurologic sequela. The patient is tolerating medications without difficulties and is otherwise without complaint today.   Past Medical History:  Diagnosis Date   Chronic systolic CHF (congestive heart failure) (HCC)    a. 2D ECHO (03/06/15) EF 20-25%, diffuse HK. Asc aortic diameter: 67mm. Mild LA dilation, mild RV dilation. Mild RV systolic dysfunction   b. LifeVest placed on 02/2015 admissoin    Coronary artery disease    a. 70% LAD lesion   Elevated TSH    ETOH abuse    Hypertension    Morbid obesity (HCC)    a. BMI ~46   Persistent atrial fibrillation (HCC)    a. newly dx 02/2015 admission. s/p successful TEE/DCCV  b. on Eliquis   Pre-diabetes    a. HgA1c 6.1   Past Surgical History:  Procedure Laterality Date   CARDIAC CATHETERIZATION N/A 04/26/2015   Procedure: Left Heart Cath and Coronary Angiography;  Surgeon: Lyn Records, MD;  Location: [facility] INVASIVE CV LAB;  Service: Cardiovascular;  Laterality: N/A;   CARDIOVERSION N/A 03/06/2015   Procedure: CARDIOVERSION;  Surgeon: Laurey Morale, MD;  Location: [facility] ENDOSCOPY;  Service: Cardiovascular;  Laterality: N/A;   CARDIOVERSION N/A 06/02/2022    Procedure: CARDIOVERSION;  Surgeon: Vesta Mixer, MD;  Location: [facility] ENDOSCOPY;  Service: Cardiovascular;  Laterality: N/A;   CORONARY ANGIOGRAPHY N/A 03/18/2022   Procedure: CORONARY ANGIOGRAPHY;  Surgeon: Kathleene Hazel, MD;  Location: [facility] INVASIVE CV LAB;  Service: Cardiovascular;  Laterality: N/A;   CORONARY ARTERY BYPASS GRAFT N/A 03/24/2022   Procedure: CORONARY ARTERY BYPASS GRAFTING (CABG) TIMES TWO USING LEFT INTERNAL MAMMARY ARTERY AND ENDOSCOPICALLY HARVESTED RIGHT GREATER SAPHENOUS VEIN;  Surgeon: Lovett Sox, MD;  Location: [facility] OR;  Service: Open Heart Surgery;  Laterality: N/A;   ENDOVEIN HARVEST OF GREATER SAPHENOUS VEIN Right 03/24/2022   Procedure: ENDOVEIN HARVEST OF GREATER SAPHENOUS VEIN;  Surgeon: Lovett Sox, MD;  Location: [facility] OR;  Service: Open Heart Surgery;  Laterality: Right;   HERNIA REPAIR     LEFT HEART CATH N/A 03/16/2022   Procedure: Left Heart Cath;  Surgeon: [name], MD;  Location: [facility] INVASIVE CV LAB;  Service: Cardiovascular;  Laterality: N/A;   TEE WITHOUT CARDIOVERSION N/A 03/06/2015   Procedure: TRANSESOPHAGEAL ECHOCARDIOGRAM (TEE);  Surgeon: Laurey Morale, MD;  Location: [facility] ENDOSCOPY;  Service: Cardiovascular;  Laterality: N/A;   TEE WITHOUT CARDIOVERSION N/A 05/02/2015   Procedure: TRANSESOPHAGEAL ECHOCARDIOGRAM (TEE);  Surgeon: Lewayne Bunting, MD;  Location: [facility] ENDOSCOPY;  Service: Cardiovascular;  Laterality: N/A;   TEE WITHOUT CARDIOVERSION N/A 03/24/2022   Procedure: TRANSESOPHAGEAL ECHOCARDIOGRAM (TEE);  Surgeon: Lovett Sox, MD;  Location: [facility] OR;  Service: Open Heart Surgery;  Laterality: N/A;    Current Outpatient Medications  Medication Sig Dispense Refill   acetaminophen (TYLENOL) 500 MG tablet Take 500 mg by mouth every 6 (six) hours as needed for mild pain or moderate pain.     ALPRAZolam (XANAX) 0.5 MG tablet TAKE 1 TABLET BY MOUTH THREE TIMES DAILYAS NEEDED FOR ANXIETY 20 tablet 0   AMBULATORY NON FORMULARY MEDICATION  Portable oxygen concentrator, 3 L [place].  Please fax to [facility]ero care (Patient taking differently: Inhale 2.5 L into the lungs at bedtime. Portable oxygen concentrator 3 L (Do not use)  Please fax to [facility]ero care) 1 each 0   apixaban (ELIQUIS) 5 MG TABS tablet Take 1 tablet (5 mg total) by mouth 2 (two) times daily. 180 tablet 3   Artificial Tear Ointment (DRY EYES OP) Place 1 drop into both nostrils daily as needed (dry eyes).     aspirin EC 81 MG EC tablet Take 1 tablet (81 mg total) by mouth daily. Swallow whole. 30 tablet 11   atorvastatin (LIPITOR) 80 MG tablet Take 1 tablet (80 mg total) by mouth daily. 90 tablet 3   calcium carbonate (TUMS - DOSED IN MG ELEMENTAL CALCIUM) 500 MG chewable tablet Chew 3 tablets by mouth 2 (two) times daily as needed for indigestion or heartburn.     diphenhydramine-acetaminophen (TYLENOL PM) 25-500 MG TABS tablet Take 1 tablet by mouth at bedtime.     furosemide (LASIX) 40 MG tablet Take 40 mg daily as directed. Take an additional 40 mg for weight gain of 3 lb over night or 5 lb weight gain in 1 week. (Patient taking differently: Take 40-80 mg by mouth See admin instructions. Take 40 mg daily. Every other day take 80 mg for 10 Days Take an additional 40 mg for weight gain of 3 lb over night or 5 lb weight gain in 1 week.) 180 tablet 3   loratadine (CLARITIN) 10 MG tablet Take 10 mg by mouth daily.     Magnesium 400 MG TABS Take 400 mg by mouth 2 (two) times a day. 60 tablet 11   montelukast (SINGULAIR) 10 MG tablet Take 10 mg by mouth at bedtime.     oxymetazoline (AFRIN) 0.05 % nasal spray Place 1 spray into left nostril daily as needed for congestion.     pantoprazole (PROTONIX) 40 MG tablet Take 1 tablet (40 mg total) by mouth daily. 90 tablet 1   potassium chloride (KLOR-CON) 10 MEQ tablet Take 1 tablet (10 mEq total) by mouth daily. 90 tablet 3   TRELEGY ELLIPTA 100-62.5-25 MCG/ACT AEPB Inhale 1 puff into the lungs daily.     amiodarone (PACERONE) 200 MG tablet  Take 1 tablet (200 mg total) by mouth every morning.     carvedilol (COREG) 12.5 MG tablet Take 1 tablet (12.5 mg total) by mouth 2 (two) times daily. Patient does not need a refill at this time.     No current facility-administered medications for this encounter.    No Known Allergies  Social History   Socioeconomic History   Marital status: Widowed    Spouse name: Not on file   Number of children: Not on file   Years of education: Not on file   Highest education level: Not on file  Occupational History   Occupation: security guard  Tobacco Use   Smoking status: Former    Types: Cigarettes    Quit date: 01/02/2013    Years since quitting: 9.4  Smokeless tobacco: Never   Tobacco comments:    smokes about a week  Substance and Sexual Activity   Alcohol use: Yes    Alcohol/week: 0.0 standard drinks of alcohol   Drug use: No   Sexual activity: Not Currently  Other Topics Concern   Not on file  Social History Narrative   Mr Lucatero 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 He reports he is independent with all his care needs and transportation to medical appointments   He is stating he is to either retire or go out on disability soon.    His daughter, Gerlean Ren is a SW per pt   Social Determinants of Health   Financial Resource Strain: Not on file  Food Insecurity: No Food Insecurity (07/18/2019)   Hunger Vital Sign    Worried About Running Out of Food in the Last Year: Never true    Angie in the Last Year: Never true  Transportation Needs: No Transportation Needs (07/18/2019)   PRAPARE - Hydrologist (Medical): No    Lack of Transportation (Non-Medical): No  Physical Activity: Not on file  Stress: Not on file  Social Connections: Unknown (07/18/2019)   Social Connection and Isolation Panel [NHANES]    Frequency of Communication with Friends and Family: Not on file    Frequency of Social Gatherings with  Friends and Family: Not on file    Attends Religious Services: Not on file    Active Member of Clubs or Organizations: Not on file    Attends Archivist Meetings: Not on file    Marital Status: Widowed  Human resources officer Violence: Not on file    Family History  Problem Relation Age of Onset   Uterine cancer Sister    Heart attack Mother    Breast cancer Mother    Sleep apnea Brother    Heart attack Maternal Grandfather    Sleep apnea Brother     ROS- All systems are reviewed and negative except as per the HPI above  Physical Exam: Vitals:   06/30/22 1340  BP: 132/68  Pulse: 75  Weight: (!) 146.7 kg  Height: 6\' 1"  (1.854 m)   Wt Readings from Last 3 Encounters:  06/30/22 (!) 146.7 kg  06/02/22 136.1 kg  05/21/22 (!) 142 kg    Labs: Lab Results  Component Value Date   NA 137 05/21/2022   K 4.6 05/21/2022   CL 98 05/21/2022   CO2 31 (H) 05/21/2022   GLUCOSE 112 (H) 05/21/2022   BUN 17 05/21/2022   CREATININE 0.86 05/21/2022   CALCIUM 9.6 05/21/2022   MG 2.2 04/20/2022   Lab Results  Component Value Date   INR 1.3 (H) 03/24/2022   Lab Results  Component Value Date   CHOL 165 03/15/2022   HDL 36 (L) 03/15/2022   LDLCALC 111 (H) 03/15/2022   TRIG 91 03/15/2022     GEN- The patient is well appearing, alert and oriented x 3 today.   Head- normocephalic, atraumatic Eyes-  Sclera clear, conjunctiva pink Ears- hearing intact Oropharynx- clear Neck- supple, no JVP Lymph- no cervical lymphadenopathy Lungs- Clear to ausculation bilaterally, normal work of breathing Heart- Regular rate and rhythm, no murmurs, rubs or gallops, PMI not laterally displaced GI- soft, NT, ND, + BS Extremities- no clubbing, cyanosis, or edema MS- no significant deformity or atrophy Skin- no rash or lesion Psych- euthymic mood,  full affect Neuro- strength and sensation are intact  EKG-atrial flutter at 75 bpm, 4:1 conduction     Assessment and Plan:  1. Atrial  flutter  Long h/o of afib managed  well for years on tikosyn Changed to Tikosyn at time of CABG in April of this year Referred back to [name] in June for return of afib/flutter  Successful cardioversion 6/20 but ERAF Pt did have URI right after cardioversion and may have encouraged return of afib Discussed with [name]  Since pt does not have a lot of options to restore SR, will try one more cardioversion If this is not successful, either stop amio to washout and reload  tikosyn  or may have to see EP for possible ablation, but I feel his weight may not make him an optimal candidate Pt will be going to [place] next week and he would like to proceed with cardioversion on his return  We will get scheduled and call him with the details  2. CAD/CABG 03/2022 No anginal symptoms  Per Dr. Rennis Golden  3.CHA2DS2VASc score of 5 Continue eliquis 5 mg bid, states no  missed doses  F/u here one week after cardioversion   Lupita Leash C. [facility] [address] [place], [place] [address] [phone]

## 2022-06-30 NOTE — Addendum Note (Signed)
Encounter addended by: Newman Nip, NP on: 06/30/2022 4:13 PM  Actions taken: Clinical Note Signed

## 2022-06-30 NOTE — Progress Notes (Addendum)
Primary Care Physician: Chyrel Masson Referring Physician: [name]  Cardiologist: Dr. Marguarite Arbour is a 66 y.o. male with a h/o afib on Tikosyn that had CABG in April and had afib at that time. His longstanding tikosyn was stopped and he was placed on amiodarone. He was seen by  [name] in June for return of afib and had a successful cardioversion 6/20 but back in atrial flutter with 4:1 conduction today. He feels fatigue but no palpitations. He states that he came down with an URI ( that he caught from one of the grand kids) right after the cardioversion. He is still coughing but much improved and mucus is clear.    Today, he denies symptoms of palpitations, chest pain, shortness of breath, orthopnea, PND, lower extremity edema, dizziness, presyncope, syncope, or neurologic sequela. The patient is tolerating medications without difficulties and is otherwise without complaint today.   Past Medical History:  Diagnosis Date   Chronic systolic CHF (congestive heart failure) (HCC)    a. 2D ECHO (03/06/15) EF 20-25%, diffuse HK. Asc aortic diameter: 67mm. Mild LA dilation, mild RV dilation. Mild RV systolic dysfunction   b. LifeVest placed on 02/2015 admissoin    Coronary artery disease    a. 70% LAD lesion   Elevated TSH    ETOH abuse    Hypertension    Morbid obesity (HCC)    a. BMI ~46   Persistent atrial fibrillation (HCC)    a. newly dx 02/2015 admission. s/p successful TEE/DCCV  b. on Eliquis   Pre-diabetes    a. HgA1c 6.1   Past Surgical History:  Procedure Laterality Date   CARDIAC CATHETERIZATION N/A 04/26/2015   Procedure: Left Heart Cath and Coronary Angiography;  Surgeon: Lyn Records, MD;  Location: [facility] INVASIVE CV LAB;  Service: Cardiovascular;  Laterality: N/A;   CARDIOVERSION N/A 03/06/2015   Procedure: CARDIOVERSION;  Surgeon: Laurey Morale, MD;  Location: [facility] ENDOSCOPY;  Service: Cardiovascular;  Laterality: N/A;   CARDIOVERSION N/A 06/02/2022    Procedure: CARDIOVERSION;  Surgeon: Vesta Mixer, MD;  Location: [facility] ENDOSCOPY;  Service: Cardiovascular;  Laterality: N/A;   CORONARY ANGIOGRAPHY N/A 03/18/2022   Procedure: CORONARY ANGIOGRAPHY;  Surgeon: Kathleene Hazel, MD;  Location: [facility] INVASIVE CV LAB;  Service: Cardiovascular;  Laterality: N/A;   CORONARY ARTERY BYPASS GRAFT N/A 03/24/2022   Procedure: CORONARY ARTERY BYPASS GRAFTING (CABG) TIMES TWO USING LEFT INTERNAL MAMMARY ARTERY AND ENDOSCOPICALLY HARVESTED RIGHT GREATER SAPHENOUS VEIN;  Surgeon: Lovett Sox, MD;  Location: [facility] OR;  Service: Open Heart Surgery;  Laterality: N/A;   ENDOVEIN HARVEST OF GREATER SAPHENOUS VEIN Right 03/24/2022   Procedure: ENDOVEIN HARVEST OF GREATER SAPHENOUS VEIN;  Surgeon: Lovett Sox, MD;  Location: [facility] OR;  Service: Open Heart Surgery;  Laterality: Right;   HERNIA REPAIR     LEFT HEART CATH N/A 03/16/2022   Procedure: Left Heart Cath;  Surgeon: [name], MD;  Location: [facility] INVASIVE CV LAB;  Service: Cardiovascular;  Laterality: N/A;   TEE WITHOUT CARDIOVERSION N/A 03/06/2015   Procedure: TRANSESOPHAGEAL ECHOCARDIOGRAM (TEE);  Surgeon: Laurey Morale, MD;  Location: [facility] ENDOSCOPY;  Service: Cardiovascular;  Laterality: N/A;   TEE WITHOUT CARDIOVERSION N/A 05/02/2015   Procedure: TRANSESOPHAGEAL ECHOCARDIOGRAM (TEE);  Surgeon: Lewayne Bunting, MD;  Location: [facility] ENDOSCOPY;  Service: Cardiovascular;  Laterality: N/A;   TEE WITHOUT CARDIOVERSION N/A 03/24/2022   Procedure: TRANSESOPHAGEAL ECHOCARDIOGRAM (TEE);  Surgeon: Lovett Sox, MD;  Location: [facility] OR;  Service: Open Heart Surgery;  Laterality: N/A;    Current Outpatient Medications  Medication Sig Dispense Refill   acetaminophen (TYLENOL) 500 MG tablet Take 500 mg by mouth every 6 (six) hours as needed for mild pain or moderate pain.     ALPRAZolam (XANAX) 0.5 MG tablet TAKE 1 TABLET BY MOUTH THREE TIMES DAILYAS NEEDED FOR ANXIETY 20 tablet 0   AMBULATORY NON FORMULARY MEDICATION  Portable oxygen concentrator, 3 L [place].  Please fax to [facility]ero care (Patient taking differently: Inhale 2.5 L into the lungs at bedtime. Portable oxygen concentrator 3 L (Do not use)  Please fax to aero care) 1 each 0   apixaban (ELIQUIS) 5 MG TABS tablet Take 1 tablet (5 mg total) by mouth 2 (two) times daily. 180 tablet 3   Artificial Tear Ointment (DRY EYES OP) Place 1 drop into both nostrils daily as needed (dry eyes).     aspirin EC 81 MG EC tablet Take 1 tablet (81 mg total) by mouth daily. Swallow whole. 30 tablet 11   atorvastatin (LIPITOR) 80 MG tablet Take 1 tablet (80 mg total) by mouth daily. 90 tablet 3   calcium carbonate (TUMS - DOSED IN MG ELEMENTAL CALCIUM) 500 MG chewable tablet Chew 3 tablets by mouth 2 (two) times daily as needed for indigestion or heartburn.     diphenhydramine-acetaminophen (TYLENOL PM) 25-500 MG TABS tablet Take 1 tablet by mouth at bedtime.     furosemide (LASIX) 40 MG tablet Take 40 mg daily as directed. Take an additional 40 mg for weight gain of 3 lb over night or 5 lb weight gain in 1 week. (Patient taking differently: Take 40-80 mg by mouth See admin instructions. Take 40 mg daily. Every other day take 80 mg for 10 Days Take an additional 40 mg for weight gain of 3 lb over night or 5 lb weight gain in 1 week.) 180 tablet 3   loratadine (CLARITIN) 10 MG tablet Take 10 mg by mouth daily.     Magnesium 400 MG TABS Take 400 mg by mouth 2 (two) times a day. 60 tablet 11   montelukast (SINGULAIR) 10 MG tablet Take 10 mg by mouth at bedtime.     oxymetazoline (AFRIN) 0.05 % nasal spray Place 1 spray into left nostril daily as needed for congestion.     pantoprazole (PROTONIX) 40 MG tablet Take 1 tablet (40 mg total) by mouth daily. 90 tablet 1   potassium chloride (KLOR-CON) 10 MEQ tablet Take 1 tablet (10 mEq total) by mouth daily. 90 tablet 3   TRELEGY ELLIPTA 100-62.5-25 MCG/ACT AEPB Inhale 1 puff into the lungs daily.     amiodarone (PACERONE) 200 MG tablet  Take 1 tablet (200 mg total) by mouth every morning.     carvedilol (COREG) 12.5 MG tablet Take 1 tablet (12.5 mg total) by mouth 2 (two) times daily. Patient does not need a refill at this time.     No current facility-administered medications for this encounter.    No Known Allergies  Social History   Socioeconomic History   Marital status: Widowed    Spouse name: Not on file   Number of children: Not on file   Years of education: Not on file   Highest education level: Not on file  Occupational History   Occupation: security guard  Tobacco Use   Smoking status: Former    Types: Cigarettes    Quit date: 01/02/2013    Years since quitting: 9.4     Smokeless tobacco: Never   Tobacco comments:    smokes about a week  Substance and Sexual Activity   Alcohol use: Yes    Alcohol/week: 0.0 standard drinks of alcohol   Drug use: No   Sexual activity: Not Currently  Other Topics Concern   Not on file  Social History Narrative   Mr Cohenour 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 He reports he is independent with all his care needs and transportation to medical appointments   He is stating he is to either retire or go out on disability soon.    His daughter, Jennier is a SW per pt   Social Determinants of Health   Financial Resource Strain: Not on file  Food Insecurity: No Food Insecurity (07/18/2019)   Hunger Vital Sign    Worried About Running Out of Food in the Last Year: Never true    Ran Out of Food in the Last Year: Never true  Transportation Needs: No Transportation Needs (07/18/2019)   PRAPARE - Transportation    Lack of Transportation (Medical): No    Lack of Transportation (Non-Medical): No  Physical Activity: Not on file  Stress: Not on file  Social Connections: Unknown (07/18/2019)   Social Connection and Isolation Panel [NHANES]    Frequency of Communication with Friends and Family: Not on file    Frequency of Social Gatherings with  Friends and Family: Not on file    Attends Religious Services: Not on file    Active Member of Clubs or Organizations: Not on file    Attends Club or Organization Meetings: Not on file    Marital Status: Widowed  Intimate Partner Violence: Not on file    Family History  Problem Relation Age of Onset   Uterine cancer Sister    Heart attack Mother    Breast cancer Mother    Sleep apnea Brother    Heart attack Maternal Grandfather    Sleep apnea Brother     ROS- All systems are reviewed and negative except as per the HPI above  Physical Exam: Vitals:   06/30/22 1340  BP: 132/68  Pulse: 75  Weight: (!) 146.7 kg  Height: 6' 1" (1.854 m)   Wt Readings from Last 3 Encounters:  06/30/22 (!) 146.7 kg  06/02/22 136.1 kg  05/21/22 (!) 142 kg    Labs: Lab Results  Component Value Date   NA 137 05/21/2022   K 4.6 05/21/2022   CL 98 05/21/2022   CO2 31 (H) 05/21/2022   GLUCOSE 112 (H) 05/21/2022   BUN 17 05/21/2022   CREATININE 0.86 05/21/2022   CALCIUM 9.6 05/21/2022   MG 2.2 04/20/2022   Lab Results  Component Value Date   INR 1.3 (H) 03/24/2022   Lab Results  Component Value Date   CHOL 165 03/15/2022   HDL 36 (L) 03/15/2022   LDLCALC 111 (H) 03/15/2022   TRIG 91 03/15/2022     GEN- The patient is well appearing, alert and oriented x 3 today.   Head- normocephalic, atraumatic Eyes-  Sclera clear, conjunctiva pink Ears- hearing intact Oropharynx- clear Neck- supple, no JVP Lymph- no cervical lymphadenopathy Lungs- Clear to ausculation bilaterally, normal work of breathing Heart- Regular rate and rhythm, no murmurs, rubs or gallops, PMI not laterally displaced GI- soft, NT, ND, + BS Extremities- no clubbing, cyanosis, or edema MS- no significant deformity or atrophy Skin- no rash or lesion Psych- euthymic mood,   full affect Neuro- strength and sensation are intact  EKG-atrial flutter at 75 bpm, 4:1 conduction     Assessment and Plan:  1. Atrial  flutter  Long h/o of afib managed  well for years on tikosyn Changed to Tikosyn at time of CABG in April of this year Referred back to [name] in June for return of afib/flutter  Successful cardioversion 6/20 but ERAF Pt did have URI right after cardioversion and may have encouraged return of afib Discussed with [name]  Since pt does not have a lot of options to restore SR, will try one more cardioversion If this is not successful, either stop amio to washout and reload  tikosyn  or may have to see EP for possible ablation, but I feel his weight may not make him an optimal candidate Pt will be going to [place] next week and he would like to proceed with cardioversion on his return  We will get scheduled and call him with the details  2. CAD/CABG 03/2022 No anginal symptoms  Per Dr. Rennis Golden  3.CHA2DS2VASc score of 5 Continue eliquis 5 mg bid, states no  missed doses  F/u here one week after cardioversion   Lupita Leash C. [facility] [address] [place], [place] [address] [phone]

## 2022-07-01 ENCOUNTER — Telehealth (HOSPITAL_COMMUNITY): Payer: Self-pay | Admitting: *Deleted

## 2022-07-01 NOTE — Telephone Encounter (Signed)
Cardioversion scheduled for 8/1 arrival 1030 for labs. NPO after MN. Pt in agreement.

## 2022-07-05 ENCOUNTER — Encounter (HOSPITAL_BASED_OUTPATIENT_CLINIC_OR_DEPARTMENT_OTHER): Payer: Self-pay | Admitting: Internal Medicine

## 2022-07-05 ENCOUNTER — Encounter: Payer: Self-pay | Admitting: Internal Medicine

## 2022-07-07 ENCOUNTER — Encounter (HOSPITAL_COMMUNITY): Payer: Self-pay | Admitting: Cardiovascular Disease

## 2022-07-07 NOTE — Progress Notes (Signed)
Attempted to obtain medical history via telephone, unable to reach at this time. HIPAA compliant voicemail message left requesting return call to pre surgical testing department. 

## 2022-07-13 ENCOUNTER — Other Ambulatory Visit: Payer: Self-pay | Admitting: Physician Assistant

## 2022-07-14 ENCOUNTER — Ambulatory Visit (HOSPITAL_COMMUNITY)
Admission: RE | Admit: 2022-07-14 | Discharge: 2022-07-14 | Disposition: A | Discharge status: Home / Self Care | Payer: Medicare (Managed Care) | Source: Ambulatory Visit | Attending: Cardiovascular Disease | Admitting: Cardiovascular Disease

## 2022-07-14 ENCOUNTER — Other Ambulatory Visit: Payer: Self-pay

## 2022-07-14 ENCOUNTER — Encounter (HOSPITAL_COMMUNITY)
Admission: RE | Disposition: A | Discharge status: Home / Self Care | Payer: Self-pay | Source: Ambulatory Visit | Attending: Cardiovascular Disease

## 2022-07-14 ENCOUNTER — Other Ambulatory Visit (HOSPITAL_COMMUNITY): Payer: Medicare (Managed Care) | Admitting: Nurse Practitioner

## 2022-07-14 ENCOUNTER — Ambulatory Visit (HOSPITAL_COMMUNITY): Payer: Medicare (Managed Care) | Admitting: Anesthesiology

## 2022-07-14 ENCOUNTER — Ambulatory Visit (HOSPITAL_BASED_OUTPATIENT_CLINIC_OR_DEPARTMENT_OTHER): Payer: Medicare (Managed Care) | Admitting: Anesthesiology

## 2022-07-14 ENCOUNTER — Encounter (HOSPITAL_COMMUNITY): Payer: Self-pay

## 2022-07-14 DIAGNOSIS — I252 Old myocardial infarction: Secondary | ICD-10-CM | POA: Diagnosis not present

## 2022-07-14 DIAGNOSIS — Z6841 Body Mass Index (BMI) 40.0 and over, adult: Secondary | ICD-10-CM | POA: Insufficient documentation

## 2022-07-14 DIAGNOSIS — Z87891 Personal history of nicotine dependence: Secondary | ICD-10-CM | POA: Insufficient documentation

## 2022-07-14 DIAGNOSIS — I5022 Chronic systolic (congestive) heart failure: Secondary | ICD-10-CM | POA: Insufficient documentation

## 2022-07-14 DIAGNOSIS — I2510 Atherosclerotic heart disease of native coronary artery without angina pectoris: Secondary | ICD-10-CM | POA: Insufficient documentation

## 2022-07-14 DIAGNOSIS — I110 Hypertensive heart disease with heart failure: Secondary | ICD-10-CM | POA: Insufficient documentation

## 2022-07-14 DIAGNOSIS — J449 Chronic obstructive pulmonary disease, unspecified: Secondary | ICD-10-CM | POA: Diagnosis not present

## 2022-07-14 DIAGNOSIS — Z79899 Other long term (current) drug therapy: Secondary | ICD-10-CM | POA: Insufficient documentation

## 2022-07-14 DIAGNOSIS — I483 Typical atrial flutter: Secondary | ICD-10-CM | POA: Diagnosis not present

## 2022-07-14 DIAGNOSIS — Z951 Presence of aortocoronary bypass graft: Secondary | ICD-10-CM | POA: Insufficient documentation

## 2022-07-14 DIAGNOSIS — I4819 Other persistent atrial fibrillation: Secondary | ICD-10-CM | POA: Insufficient documentation

## 2022-07-14 DIAGNOSIS — I4892 Unspecified atrial flutter: Secondary | ICD-10-CM

## 2022-07-14 DIAGNOSIS — E119 Type 2 diabetes mellitus without complications: Secondary | ICD-10-CM | POA: Insufficient documentation

## 2022-07-14 DIAGNOSIS — Z7901 Long term (current) use of anticoagulants: Secondary | ICD-10-CM | POA: Diagnosis not present

## 2022-07-14 HISTORY — PX: CARDIOVERSION: SHX1299

## 2022-07-14 LAB — POCT I-STAT, CHEM 8
BUN: 20 mg/dL (ref 8–23)
Calcium, Ion: 0.99 mmol/L — ABNORMAL LOW (ref 1.15–1.40)
Chloride: 97 mmol/L — ABNORMAL LOW (ref 98–111)
Creatinine, Ser: 0.6 mg/dL — ABNORMAL LOW (ref 0.61–1.24)
Glucose, Bld: 135 mg/dL — ABNORMAL HIGH (ref 70–99)
HCT: 42 % (ref 39.0–52.0)
Hemoglobin: 14.3 g/dL (ref 13.0–17.0)
Potassium: 4.2 mmol/L (ref 3.5–5.1)
Sodium: 139 mmol/L (ref 135–145)
TCO2: 35 mmol/L — ABNORMAL HIGH (ref 22–32)

## 2022-07-14 SURGERY — CARDIOVERSION
Anesthesia: General

## 2022-07-14 MED ORDER — SODIUM CHLORIDE 0.9 % IV SOLN
INTRAVENOUS | Status: DC
Start: 2022-07-14 — End: 2022-07-14

## 2022-07-14 MED ORDER — LIDOCAINE HCL (CARDIAC) PF 100 MG/5ML IV SOSY
PREFILLED_SYRINGE | INTRAVENOUS | Status: DC | PRN
  Administered 2022-07-14: 60 mg via INTRAVENOUS

## 2022-07-14 MED ORDER — PROPOFOL 10 MG/ML IV BOLUS
INTRAVENOUS | Status: DC | PRN
  Administered 2022-07-14: 70 mg via INTRAVENOUS

## 2022-07-14 NOTE — Discharge Instructions (Signed)

## 2022-07-14 NOTE — Transfer of Care (Signed)
Immediate Anesthesia Transfer of Care Note  Patient: Arthur Church  Procedure(s) Performed: CARDIOVERSION  Patient Location: Endoscopy Unit  Anesthesia Type:MAC  Level of Consciousness: drowsy  Airway & Oxygen Therapy: Patient Spontanous Breathing  Post-op Assessment: Report given to RN and Post -op Vital signs reviewed and stable  Post vital signs: Reviewed and stable  Last Vitals:  Vitals Value Taken Time  BP    Temp    Pulse    Resp    SpO2      Last Pain:  Vitals:   07/14/22 1024  TempSrc: Temporal  PainSc: 0-No pain         Complications: No notable events documented.

## 2022-07-14 NOTE — Anesthesia Postprocedure Evaluation (Signed)
Anesthesia Post Note  Patient: Arthur Church  Procedure(s) Performed: CARDIOVERSION     Patient location during evaluation: Endoscopy Anesthesia Type: General Level of consciousness: awake and alert Pain management: pain level controlled Vital Signs Assessment: post-procedure vital signs reviewed and stable Respiratory status: spontaneous breathing, nonlabored ventilation and respiratory function stable Cardiovascular status: blood pressure returned to baseline and stable Postop Assessment: no apparent nausea or vomiting Anesthetic complications: no   No notable events documented.  Last Vitals:  Vitals:   07/14/22 1130 07/14/22 1140  BP: 126/76 133/75  Pulse: 63 65  Resp: 16 20  Temp:    SpO2: 92% 93%    Last Pain:  Vitals:   07/14/22 1140  TempSrc:   PainSc: 0-No pain                 [name],W. EDMOND

## 2022-07-14 NOTE — Op Note (Signed)
Procedure: Electrical Cardioversion Indications:  Atrial Flutter  Procedure Details:  Consent: Risks of procedure as well as the alternatives and risks of each were explained to the (patient/caregiver).  Consent for procedure obtained.  Time Out: Verified patient identification, verified procedure, site/side was marked, verified correct patient position, special equipment/implants available, medications/allergies/relevent history reviewed, required imaging and test results available.  Performed  Patient placed on cardiac monitor, pulse oximetry, supplemental oxygen as necessary.  Sedation given:  propofol IV, Dr. Aleene Davidson Pacer pads placed anterior and posterior chest.  Cardioverted 1 time(s).  Cardioversion with synchronized biphasic 200J shock.  Evaluation: Findings: Post procedure EKG shows: NSR Complications: None Patient did tolerate procedure well.  Time Spent Directly with the Patient:  30 minutes   Dawana Asper 07/14/2022, 11:22 AM

## 2022-07-14 NOTE — Interval H&P Note (Signed)
History and Physical Interval Note:  07/14/2022 3:19 PM  Arthur Church  has presented today for surgery, with the diagnosis of AFIB.  The various methods of treatment have been discussed with the patient and family. After consideration of risks, benefits and other options for treatment, the patient has consented to  Procedure(s): CARDIOVERSION (N/A) as a surgical intervention.  The patient's history has been reviewed, patient examined, no change in status, stable for surgery.  I have reviewed the patient's chart and labs.  Questions were answered to the patient's satisfaction.     Saba Neuman

## 2022-07-14 NOTE — Anesthesia Preprocedure Evaluation (Addendum)
Anesthesia Evaluation  Patient identified by MRN, date of birth, ID band Patient awake    Reviewed: Allergy & Precautions, H&P , NPO status , Patient's Chart, lab work & pertinent test results, reviewed documented beta blocker date and time   Airway Mallampati: II  TM Distance: >3 FB Neck ROM: Full    Dental no notable dental hx. (+) Poor Dentition, Dental Advisory Given   Pulmonary COPD,  COPD inhaler, former smoker,    Pulmonary exam normal breath sounds clear to auscultation       Cardiovascular hypertension, Pt. on medications and Pt. on home beta blockers + CAD, + Past MI and +CHF  + dysrhythmias Atrial Fibrillation + Valvular Problems/Murmurs MR  Rhythm:Regular Rate:Normal     Neuro/Psych negative neurological ROS  negative psych ROS   GI/Hepatic negative GI ROS, Neg liver ROS,   Endo/Other  diabetesMorbid obesity  Renal/GU negative Renal ROS  negative genitourinary   Musculoskeletal   Abdominal   Peds  Hematology negative hematology ROS (+)   Anesthesia Other Findings   Reproductive/Obstetrics negative OB ROS                            Anesthesia Physical Anesthesia Plan  ASA: 3  Anesthesia Plan: General   Post-op Pain Management: Minimal or no pain anticipated   Induction: Intravenous  PONV Risk Score and Plan: 2 and Propofol infusion and Treatment may vary due to age or medical condition  Airway Management Planned: Mask  Additional Equipment:   Intra-op Plan:   Post-operative Plan:   Informed Consent: I have reviewed the patients History and Physical, chart, labs and discussed the procedure including the risks, benefits and alternatives for the proposed anesthesia with the patient or authorized representative who has indicated his/her understanding and acceptance.     Dental advisory given  Plan Discussed with: CRNA  Anesthesia Plan Comments:          Anesthesia Quick Evaluation

## 2022-07-16 ENCOUNTER — Encounter (HOSPITAL_COMMUNITY): Payer: Self-pay | Admitting: Cardiovascular Disease

## 2022-07-18 ENCOUNTER — Other Ambulatory Visit: Payer: Self-pay | Admitting: Physician Assistant

## 2022-07-20 ENCOUNTER — Telehealth: Payer: Self-pay | Admitting: Internal Medicine

## 2022-07-20 NOTE — Telephone Encounter (Signed)
Pt is calling stating he spoke with someone earlier in regards to being in Afib. Requesting call back. Also states he would like to discuss medication management. Please advise.

## 2022-07-22 ENCOUNTER — Encounter (HOSPITAL_COMMUNITY): Payer: Self-pay | Admitting: Nurse Practitioner

## 2022-07-22 ENCOUNTER — Ambulatory Visit (HOSPITAL_COMMUNITY)
Admission: RE | Admit: 2022-07-22 | Discharge: 2022-07-22 | Disposition: A | Discharge status: Home / Self Care | Payer: Medicare (Managed Care) | Source: Ambulatory Visit | Attending: Nurse Practitioner | Admitting: Nurse Practitioner

## 2022-07-22 VITALS — BP 108/66 | HR 61 | Ht 73.0 in | Wt 313.8 lb

## 2022-07-22 DIAGNOSIS — Z79899 Other long term (current) drug therapy: Secondary | ICD-10-CM | POA: Insufficient documentation

## 2022-07-22 DIAGNOSIS — I4892 Unspecified atrial flutter: Secondary | ICD-10-CM | POA: Diagnosis not present

## 2022-07-22 DIAGNOSIS — D6869 Other thrombophilia: Secondary | ICD-10-CM

## 2022-07-22 DIAGNOSIS — I2510 Atherosclerotic heart disease of native coronary artery without angina pectoris: Secondary | ICD-10-CM | POA: Diagnosis not present

## 2022-07-22 DIAGNOSIS — I480 Paroxysmal atrial fibrillation: Secondary | ICD-10-CM | POA: Insufficient documentation

## 2022-07-22 DIAGNOSIS — R9431 Abnormal electrocardiogram [ECG] [EKG]: Secondary | ICD-10-CM | POA: Diagnosis not present

## 2022-07-22 DIAGNOSIS — Z951 Presence of aortocoronary bypass graft: Secondary | ICD-10-CM | POA: Diagnosis not present

## 2022-07-22 DIAGNOSIS — Z7901 Long term (current) use of anticoagulants: Secondary | ICD-10-CM | POA: Insufficient documentation

## 2022-07-22 DIAGNOSIS — I5032 Chronic diastolic (congestive) heart failure: Secondary | ICD-10-CM | POA: Insufficient documentation

## 2022-07-22 MED ORDER — AMIODARONE HCL 200 MG PO TABS: 200.0000 mg | ORAL_TABLET | Freq: Every morning | ORAL | 1 refills | Status: DC

## 2022-07-22 NOTE — Progress Notes (Signed)
Primary Care Physician: Chyrel Masson Referring Physician: [name]  Cardiologist: Dr. Marguarite Arbour is a 66 y.o. male with a h/o afib on Tikosyn that had CABG in April and had afib at that time. His longstanding tikosyn was stopped and he was placed on amiodarone. He was seen by  [name] in June for return of afib and had a successful cardioversion 6/20 but back in atrial flutter with 4:1 conduction today. He feels fatigue but no palpitations. He states that he came down with an URI ( that he caught from one of the grand kids) right after the cardioversion. He is still coughing but much improved and mucus is clear.   F/u in afib clinic, 07/22/22. He had successful cardioversion but had ERAF. Prior cardioversion 6/20 scheduled by [name] also with ERAF. He has several things working to encourage afib, severely dilated L atrium, morbid obesity and untreated sleep apnea, per pt his machine was taken away as he did not fit criteria to use  it any longer.  He was put on amiodarone after by pass April of this year as tikosyn failed to keep him in rhythm. [name] mentioned possibly stopping amiodarone and after washout retrying Tikosyn. I don't think he is an optimal ablation candide at this point, but will refer to EP to discuss if ablation may be possible . Discussed with pt no quick fix as if we stopped amiodarone today, it may take 2-3 months before amio washout.   Today, he denies symptoms of palpitations, chest pain, shortness of breath, orthopnea, PND, lower extremity edema, dizziness, presyncope, syncope, or neurologic sequela. The patient is tolerating medications without difficulties and is otherwise without complaint today.   Past Medical History:  Diagnosis Date   Chronic systolic CHF (congestive heart failure) (HCC)    a. 2D ECHO (03/06/15) EF 20-25%, diffuse HK. Asc aortic diameter: 33mm. Mild LA dilation, mild RV dilation. Mild RV systolic dysfunction   b. LifeVest  placed on 02/2015 admissoin    Coronary artery disease    a. 70% LAD lesion   Elevated TSH    ETOH abuse    Hypertension    Morbid obesity (HCC)    a. BMI ~46   Persistent atrial fibrillation (HCC)    a. newly dx 02/2015 admission. s/p successful TEE/DCCV  b. on Eliquis   Pre-diabetes    a. HgA1c 6.1   Past Surgical History:  Procedure Laterality Date   CARDIAC CATHETERIZATION N/A 04/26/2015   Procedure: Left Heart Cath and Coronary Angiography;  Surgeon: Lyn Records, MD;  Location: [facility] INVASIVE CV LAB;  Service: Cardiovascular;  Laterality: N/A;   CARDIOVERSION N/A 03/06/2015   Procedure: CARDIOVERSION;  Surgeon: Laurey Morale, MD;  Location: [facility] ENDOSCOPY;  Service: Cardiovascular;  Laterality: N/A;   CARDIOVERSION N/A 06/02/2022   Procedure: CARDIOVERSION;  Surgeon: Vesta Mixer, MD;  Location: [facility] ENDOSCOPY;  Service: Cardiovascular;  Laterality: N/A;   CARDIOVERSION N/A 07/14/2022   Procedure: CARDIOVERSION;  Surgeon: Thurmon Fair, MD;  Location: [facility] ENDOSCOPY;  Service: Cardiovascular;  Laterality: N/A;   CORONARY ANGIOGRAPHY N/A 03/18/2022   Procedure: CORONARY ANGIOGRAPHY;  Surgeon: Kathleene Hazel, MD;  Location: [facility] INVASIVE CV LAB;  Service: Cardiovascular;  Laterality: N/A;   CORONARY ARTERY BYPASS GRAFT N/A 03/24/2022   Procedure: CORONARY ARTERY BYPASS GRAFTING (CABG) TIMES TWO USING LEFT INTERNAL MAMMARY ARTERY AND ENDOSCOPICALLY HARVESTED RIGHT GREATER SAPHENOUS VEIN;  Surgeon: Lovett Sox, MD;  Location: [facility] OR;  Service: Open Heart Surgery;  Laterality: N/A;   ENDOVEIN HARVEST OF GREATER SAPHENOUS VEIN Right 03/24/2022   Procedure: ENDOVEIN HARVEST OF GREATER SAPHENOUS VEIN;  Surgeon: Lovett Sox, MD;  Location: [facility] OR;  Service: Open Heart Surgery;  Laterality: Right;   HERNIA REPAIR     LEFT HEART CATH N/A 03/16/2022   Procedure: Left Heart Cath;  Surgeon: [name], MD;  Location: [facility] INVASIVE CV LAB;  Service: Cardiovascular;  Laterality: N/A;   TEE  WITHOUT CARDIOVERSION N/A 03/06/2015   Procedure: TRANSESOPHAGEAL ECHOCARDIOGRAM (TEE);  Surgeon: Laurey Morale, MD;  Location: [facility] ENDOSCOPY;  Service: Cardiovascular;  Laterality: N/A;   TEE WITHOUT CARDIOVERSION N/A 05/02/2015   Procedure: TRANSESOPHAGEAL ECHOCARDIOGRAM (TEE);  Surgeon: Lewayne Bunting, MD;  Location: [facility] ENDOSCOPY;  Service: Cardiovascular;  Laterality: N/A;   TEE WITHOUT CARDIOVERSION N/A 03/24/2022   Procedure: TRANSESOPHAGEAL ECHOCARDIOGRAM (TEE);  Surgeon: Lovett Sox, MD;  Location: [facility] OR;  Service: Open Heart Surgery;  Laterality: N/A;    Current Outpatient Medications  Medication Sig Dispense Refill   acetaminophen (TYLENOL) 500 MG tablet Take 500 mg by mouth every 6 (six) hours as needed for mild pain or moderate pain.     ALPRAZolam (XANAX) 0.5 MG tablet TAKE 1 TABLET BY MOUTH THREE TIMES DAILYAS NEEDED FOR ANXIETY 20 tablet 0   AMBULATORY NON FORMULARY MEDICATION Portable oxygen concentrator, 3 L [employer].  Please fax to [facility] (Patient taking differently: Inhale 2.5 L into the lungs at bedtime. Portable oxygen concentrator 3 L (Do not use)  Please fax to [facility]) 1 each 0   amiodarone (PACERONE) 200 MG tablet Take 1 tablet (200 mg total) by mouth every morning.     apixaban (ELIQUIS) 5 MG TABS tablet Take 1 tablet (5 mg total) by mouth 2 (two) times daily. 180 tablet 3   Artificial Tear Ointment (DRY EYES OP) Place 1 drop into both nostrils daily as needed (dry eyes).     aspirin EC 81 MG EC tablet Take 1 tablet (81 mg total) by mouth daily. Swallow whole. 30 tablet 11   atorvastatin (LIPITOR) 80 MG tablet Take 1 tablet (80 mg total) by mouth daily. 90 tablet 3   calcium carbonate (TUMS - DOSED IN MG ELEMENTAL CALCIUM) 500 MG chewable tablet Chew 3 tablets by mouth 2 (two) times daily as needed for indigestion or heartburn.     carvedilol (COREG) 12.5 MG tablet Take 1 tablet (12.5 mg total) by mouth 2 (two) times daily. Patient does not need a refill at this  time.     diphenhydramine-acetaminophen (TYLENOL PM) 25-500 MG TABS tablet Take 1 tablet by mouth at bedtime.     furosemide (LASIX) 40 MG tablet Take 40 mg daily as directed. Take an additional 40 mg for weight gain of 3 lb over night or 5 lb weight gain in 1 week. (Patient taking differently: Take 40-80 mg by mouth See admin instructions. Take 40 mg daily. Every other day take 80 mg for 10 Days Take an additional 40 mg for weight gain of 3 lb over night or 5 lb weight gain in 1 week.) 180 tablet 3   loratadine (CLARITIN) 10 MG tablet Take 10 mg by mouth daily.     Magnesium 400 MG TABS Take 400 mg by mouth 2 (two) times a day. 60 tablet 11   montelukast (SINGULAIR) 10 MG tablet Take 10 mg by mouth at bedtime.     oxymetazoline (AFRIN) 0.05 % nasal spray Place 1 spray  into left nostril daily as needed for congestion.     pantoprazole (PROTONIX) 40 MG tablet Take 1 tablet (40 mg total) by mouth daily. 90 tablet 1   potassium chloride (KLOR-CON) 10 MEQ tablet Take 1 tablet (10 mEq total) by mouth daily. 90 tablet 3   TRELEGY ELLIPTA 100-62.5-25 MCG/ACT AEPB Inhale 1 puff into the lungs daily.     No current facility-administered medications for this encounter.    No Known Allergies  Social History   Socioeconomic History   Marital status: Widowed    Spouse name: Not on file   Number of children: Not on file   Years of education: Not on file   Highest education level: Not on file  Occupational History   Occupation: security guard  Tobacco Use   Smoking status: Former    Types: Cigarettes    Quit date: 01/02/2013    Years since quitting: 9.5   Smokeless tobacco: Never   Tobacco comments:    smokes about a week  Substance and Sexual Activity   Alcohol use: Yes    Alcohol/week: 0.0 standard drinks of alcohol   Drug use: No   Sexual activity: Not Currently  Other Topics Concern   Not on file  Social History Narrative   Mr Want is a 66 year old male He is a security guard His wife  passed in April 2019. He reports he is independent with all his care needs and transportation to medical appointments   He is stating he is to either retire or go out on disability soon.    His daughter, Hassel Neth is a SW per pt   Social Determinants of Health   Financial Resource Strain: Not on file  Food Insecurity: No Food Insecurity (07/18/2019)   Hunger Vital Sign    Worried About Running Out of Food in the Last Year: Never true    Ran Out of Food in the Last Year: Never true  Transportation Needs: No Transportation Needs (07/18/2019)   PRAPARE - Administrator, Civil Service (Medical): No    Lack of Transportation (Non-Medical): No  Physical Activity: Not on file  Stress: Not on file  Social Connections: Unknown (07/18/2019)   Social Connection and Isolation Panel [NHANES]    Frequency of Communication with Friends and Family: Not on file    Frequency of Social Gatherings with Friends and Family: Not on file    Attends Religious Services: Not on file    Active Member of Clubs or Organizations: Not on file    Attends Banker Meetings: Not on file    Marital Status: Widowed  Catering manager Violence: Not on file    Family History  Problem Relation Age of Onset   Uterine cancer Sister    Heart attack Mother    Breast cancer Mother    Sleep apnea Brother    Heart attack Maternal Grandfather    Sleep apnea Brother     ROS- All systems are reviewed and negative except as per the HPI above  Physical Exam: Vitals:   07/22/22 1014  Height: 6\' 1"  (1.854 m)   Wt Readings from Last 3 Encounters:  07/14/22 (!) 140.6 kg  06/30/22 (!) 146.7 kg  06/02/22 136.1 kg    Labs: Lab Results  Component Value Date   NA 139 07/14/2022   K 4.2 07/14/2022   CL 97 (L) 07/14/2022   CO2 31 (H) 05/21/2022   GLUCOSE 135 (H) 07/14/2022   BUN  20 07/14/2022   CREATININE 0.60 (L) 07/14/2022   CALCIUM 9.6 05/21/2022   MG 2.2 04/20/2022   Lab Results  Component Value  Date   INR 1.3 (H) 03/24/2022   Lab Results  Component Value Date   CHOL 165 03/15/2022   HDL 36 (L) 03/15/2022   LDLCALC 111 (H) 03/15/2022   TRIG 91 03/15/2022     GEN- The patient is well appearing, alert and oriented x 3 today.   Head- normocephalic, atraumatic Eyes-  Sclera clear, conjunctiva pink Ears- hearing intact Oropharynx- clear Neck- supple, no JVP Lymph- no cervical lymphadenopathy Lungs- Clear to ausculation bilaterally, normal work of breathing Heart- Regular rate and rhythm, no murmurs, rubs or gallops, PMI not laterally displaced GI- soft, NT, ND, + BS Extremities- no clubbing, cyanosis, or edema MS- no significant deformity or atrophy Skin- no rash or lesion Psych- euthymic mood, full affect Neuro- strength and sensation are intact  EKG- Vent. rate 61 BPM PR interval 128 ms QRS duration 88 ms QT/QTcB 414/416 ms P-R-T axes 141 80 -55 Unusual P axis, possible ectopic atrial rhythm Probable atrial flutter  Possible Anterior infarct , age undetermined ST & T wave abnormality, consider inferior ischemia Abnormal ECG When compared with ECG of 14-Jul-2022 11:40, PREVIOUS ECG IS PRESENT    Assessment and Plan:  1. Atrial flutter  Long h/o of afib managed  well for years on tikosyn Changed to amiodarone  at time of CABG in April of this year as tikosyn failed to keep him in rhythm  Referred back to [name] in June for return of afib/flutter  Successful cardioversion 6/20 but ERAF Pt did have URI right after cardioversion and may have encouraged return of afib, so opted for one more cardioversion  Another cardioversion 07/14/22, successful but again ERAF after possibly 3 days  Discussed with pt  either to stop amio to washout and reload  tikosyn  or may have to see EP for possible ablation, but I feel his weight, left atrial size may not make him an optimal candidate Explained to pt today, I did not have a quick fix, but he is  rate controlled and will  continue amiodarone 200 mg for the time being for rate control If considered an ablation candidate, amiod will be good to be on board for the post op period  I will refer to EP, Dr. Elberta Fortis of Dr. Lalla Brothers to discuss if able to have ablation   2. CAD/CABG 03/2022 No anginal symptoms  Per Dr. Rennis Golden  3.[id number] score of 5 Continue eliquis 5 mg bid   4. Chronic combined sys/dia HF Encouraged to weight daily and use daily lasix Avoid salt  Limit fluids to 2L a day   F/u here one week after cardioversion   Lupita Leash C. [facility] [address] [place], [place] [address] [phone]

## 2022-07-23 NOTE — Telephone Encounter (Signed)
Spoke with pt who states he is doing well today after being seen in Afib clinic yesterday.  Pt is being referred back to EP to discuss the possibility of ablation.  Pt advised he will be contacted by our EP scheduler.  Pt verbalizes understanding and thanked Charity fundraiser for the call.

## 2022-07-27 ENCOUNTER — Telehealth: Payer: Self-pay | Admitting: Internal Medicine

## 2022-07-27 NOTE — Telephone Encounter (Signed)
Patient called to check if he needs to have another Lipid test prior to his appointment on 8/18.

## 2022-07-27 NOTE — Telephone Encounter (Signed)
Returned call to patient and advised him that per last OV from 5/23 with Dr. Rennis Golden he does need FLP done. Order is in already. Patient will come to [facility] office to have this drawn tomorrow. Advised patient of appointment time, date and location with Dr. Rennis Golden this Friday 8/18. Patient aware and verbalized understanding.

## 2022-07-28 LAB — LIPID PANEL
Chol/HDL Ratio: 3.4 ratio (ref 0.0–5.0)
Cholesterol, Total: 144 mg/dL (ref 100–199)
HDL: 42 mg/dL (ref >39)
LDL Chol Calc (NIH): 85 mg/dL (ref 0–99)
Triglycerides: 88 mg/dL (ref 0–149)
VLDL Cholesterol Cal: 17 mg/dL (ref 5–40)

## 2022-07-31 ENCOUNTER — Encounter (HOSPITAL_BASED_OUTPATIENT_CLINIC_OR_DEPARTMENT_OTHER): Payer: Self-pay | Admitting: Internal Medicine

## 2022-07-31 ENCOUNTER — Ambulatory Visit (INDEPENDENT_AMBULATORY_CARE_PROVIDER_SITE_OTHER): Payer: Medicare (Managed Care) | Admitting: Internal Medicine

## 2022-07-31 VITALS — BP 100/80 | HR 56 | Ht 73.0 in | Wt 309.1 lb

## 2022-07-31 DIAGNOSIS — I255 Ischemic cardiomyopathy: Secondary | ICD-10-CM

## 2022-07-31 DIAGNOSIS — I5042 Chronic combined systolic (congestive) and diastolic (congestive) heart failure: Secondary | ICD-10-CM

## 2022-07-31 DIAGNOSIS — Z951 Presence of aortocoronary bypass graft: Secondary | ICD-10-CM

## 2022-07-31 DIAGNOSIS — I8393 Asymptomatic varicose veins of bilateral lower extremities: Secondary | ICD-10-CM | POA: Diagnosis not present

## 2022-07-31 DIAGNOSIS — E785 Hyperlipidemia, unspecified: Secondary | ICD-10-CM

## 2022-07-31 DIAGNOSIS — I480 Paroxysmal atrial fibrillation: Secondary | ICD-10-CM | POA: Diagnosis not present
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
## 2022-07-31 NOTE — Patient Instructions (Signed)
Medication Instructions:  Your physician recommends that you continue on your current medications as directed. Please refer to the Current Medication list given to you today.  *If you need a refill on your cardiac medications before your next appointment, please call your pharmacy*   Testing/Procedures: Your physician has requested that you have an echocardiogram. Echocardiography is a painless test that uses sound waves to create images of your heart. It provides your doctor with information about the size and shape of your heart and how well your heart's chambers and valves are working. This procedure takes approximately one hour. There are no restrictions for this procedure. Due Jan/Feb 2024.    Follow-Up: At [facility], you and your health needs are our priority.  As part of our continuing mission to provide you with exceptional heart care, we have created designated Provider Care Teams.  These Care Teams include your primary Cardiologist (physician) and Advanced Practice Providers (APPs -  Physician Assistants and Nurse Practitioners) who all work together to provide you with the care you need, when you need it.  We recommend signing up for the patient portal called "MyChart".  Sign up information is provided on this After Visit Summary.  MyChart is used to connect with patients for Virtual Visits (Telemedicine).  Patients are able to view lab/test results, encounter notes, upcoming appointments, etc.  Non-urgent messages can be sent to your provider as well.   To learn more about what you can do with MyChart, go to [email or web address].    Your next appointment:   6 months with Dr. Rennis Golden

## 2022-08-21 ENCOUNTER — Ambulatory Visit: Payer: Medicare (Managed Care) | Attending: Cardiology | Admitting: Cardiology

## 2022-08-21 ENCOUNTER — Encounter: Payer: Self-pay | Admitting: Cardiology

## 2022-08-21 VITALS — BP 126/70 | HR 62 | Ht 73.0 in | Wt 307.4 lb

## 2022-08-21 DIAGNOSIS — I4819 Other persistent atrial fibrillation: Secondary | ICD-10-CM | POA: Diagnosis not present

## 2022-08-21 NOTE — Progress Notes (Signed)
Electrophysiology Office Note   Date:  08/21/2022   ID:  Arthur Church, DOB 12/04/1956, MRN [id number]  PCP:  Gerald Leitz  Cardiologist:  Debara Pickett Primary Electrophysiologist: Meridee Score, MD    Chief Complaint: AF   History of Present Illness: Arthur Church is a 66 y.o. male who is being seen today for the evaluation of AF at the request of Andria Frames, PA-C. Presenting today for electrophysiology evaluation.  Is a history seen for atrial fibrillation, coronary artery disease, alcohol abuse, hypertension, morbid obesity, prediabetes.  He is status post CABG April 2023.  He was previously on Tikosyn, but this was stopped and he was started on amiodarone.  He had a cardioversion June 2023, but went back into atrial flutter with 4-1 conduction.  There are some days that he feels poorly, fatigued and short of breath, others that he feels well without complaints.  He felt quite poorly 3 months after his cardioversion, but today feels well and he has felt well over the last few weeks.  Today, he denies symptoms of palpitations, chest pain, shortness of breath, orthopnea, PND, lower extremity edema, claudication, dizziness, presyncope, syncope, bleeding, or neurologic sequela. The patient is tolerating medications without difficulties.    Past Medical History:  Diagnosis Date   Chronic systolic CHF (congestive heart failure) (HCC)    a. 2D ECHO (03/06/15) EF 20-25%, diffuse HK. Asc aortic diameter: 50mm. Mild LA dilation, mild RV dilation. Mild RV systolic dysfunction   b. LifeVest placed on 02/2015 admissoin    Coronary artery disease    a. 70% LAD lesion   Elevated TSH    ETOH abuse    Hypertension    Morbid obesity ([place])    a. BMI ~46   Persistent atrial fibrillation ([place])    a. newly dx 02/2015 admission. s/p successful TEE/DCCV  b. on Eliquis   Pre-diabetes    a. HgA1c 6.1   Past Surgical History:  Procedure Laterality Date   CARDIAC  CATHETERIZATION N/A 04/26/2015   Procedure: Left Heart Cath and Coronary Angiography;  Surgeon: Belva Crome, MD;  Location: [place] [facility];  Service: Cardiovascular;  Laterality: N/A;   CARDIOVERSION N/A 03/06/2015   Procedure: CARDIOVERSION;  Surgeon: Larey Dresser, MD;  Location: [facility];  Service: Cardiovascular;  Laterality: N/A;   CARDIOVERSION N/A 06/02/2022   Procedure: CARDIOVERSION;  Surgeon: Thayer Headings, MD;  Location: [facility];  Service: Cardiovascular;  Laterality: N/A;   CARDIOVERSION N/A 07/14/2022   Procedure: CARDIOVERSION;  Surgeon: Sanda Klein, MD;  Location: [place];  Service: Cardiovascular;  Laterality: N/A;   CORONARY ANGIOGRAPHY N/A 03/18/2022   Procedure: CORONARY ANGIOGRAPHY;  Surgeon: Burnell Blanks, MD;  Location: [facility];  Service: Cardiovascular;  Laterality: N/A;   CORONARY ARTERY BYPASS GRAFT N/A 03/24/2022   Procedure: CORONARY ARTERY BYPASS GRAFTING (CABG) TIMES TWO USING LEFT INTERNAL MAMMARY ARTERY AND ENDOSCOPICALLY HARVESTED RIGHT GREATER SAPHENOUS VEIN;  Surgeon: Dahlia Byes, MD;  Location: [facility];  Service: Open Heart Surgery;  Laterality: N/A;   ENDOVEIN HARVEST OF GREATER SAPHENOUS VEIN Right 03/24/2022   Procedure: ENDOVEIN HARVEST OF GREATER SAPHENOUS VEIN;  Surgeon: Dahlia Byes, MD;  Location: [facility];  Service: Open Heart Surgery;  Laterality: Right;   HERNIA REPAIR     LEFT HEART CATH N/A 03/16/2022   Procedure: Left Heart Cath;  Surgeon: Dixie Dials, MD;  Location: [place] [facility];  Service: Cardiovascular;  Laterality: N/A;  TEE WITHOUT CARDIOVERSION N/A 03/06/2015   Procedure: TRANSESOPHAGEAL ECHOCARDIOGRAM (TEE);  Surgeon: Laurey Morale, MD;  Location[facility];  Service: Cardiovascular;  Laterality: N/A;   TEE WITHOUT CARDIOVERSION N/A 05/02/2015   Procedure: TRANSESOPHAGEAL ECHOCARDIOGRAM (TEE);  Surgeon: Lewayne Bunting, MD;  Location: [facility] ENDOSCOPY;  Service: Cardiovascular;  Laterality:  N/A;   TEE WITHOUT CARDIOVERSION N/A 03/24/2022   Procedure: TRANSESOPHAGEAL ECHOCARDIOGRAM (TEE);  Surgeon: Lovett Sox, MD;  Location: [facility] OR;  Service: Open Heart Surgery;  Laterality: N/A;     Current Outpatient Medications  Medication Sig Dispense Refill   acetaminophen (TYLENOL) 500 MG tablet Take 500 mg by mouth every 6 (six) hours as needed for mild pain or moderate pain.     ALPRAZolam (XANAX) 0.5 MG tablet TAKE 1 TABLET BY MOUTH THREE TIMES DAILYAS NEEDED FOR ANXIETY 20 tablet 0   AMBULATORY NON FORMULARY MEDICATION Portable oxygen concentrator, 3 L [place].  Please fax to [facility] (Patient taking differently: Inhale 2.5 L into the lungs at bedtime. Portable oxygen concentrator 3 L (Do not use)  Please fax to [facility]) 1 each 0   amiodarone (PACERONE) 200 MG tablet Take 1 tablet (200 mg total) by mouth every morning. 90 tablet 1   apixaban (ELIQUIS) 5 MG TABS tablet Take 1 tablet (5 mg total) by mouth 2 (two) times daily. 180 tablet 3   Artificial Tear Ointment (DRY EYES OP) Place 1 drop into both nostrils daily as needed (dry eyes).     aspirin EC 81 MG EC tablet Take 1 tablet (81 mg total) by mouth daily. Swallow whole. 30 tablet 11   atorvastatin (LIPITOR) 80 MG tablet Take 1 tablet (80 mg total) by mouth daily. 90 tablet 3   calcium carbonate (TUMS - DOSED IN MG ELEMENTAL CALCIUM) 500 MG chewable tablet Chew 3 tablets by mouth 2 (two) times daily as needed for indigestion or heartburn.     carvedilol (COREG) 12.5 MG tablet Take 1 tablet (12.5 mg total) by mouth 2 (two) times daily. Patient does not need a refill at this time.     diphenhydramine-acetaminophen (TYLENOL PM) 25-500 MG TABS tablet Take 1 tablet by mouth at bedtime.     furosemide (LASIX) 40 MG tablet Take 40 mg daily as directed. Take an additional 40 mg for weight gain of 3 lb over night or 5 lb weight gain in 1 week. (Patient taking differently: Take 40-80 mg by mouth See admin instructions. Take 40 mg daily. Every  other day take 80 mg for 10 Days Take an additional 40 mg for weight gain of 3 lb over night or 5 lb weight gain in 1 week.) 180 tablet 3   loratadine (CLARITIN) 10 MG tablet Take 10 mg by mouth daily.     Magnesium 400 MG TABS Take 400 mg by mouth 2 (two) times a day. 60 tablet 11   montelukast (SINGULAIR) 10 MG tablet Take 10 mg by mouth at bedtime.     oxymetazoline (AFRIN) 0.05 % nasal spray Place 1 spray into left nostril daily as needed for congestion.     pantoprazole (PROTONIX) 40 MG tablet Take 1 tablet (40 mg total) by mouth daily. 90 tablet 1   potassium chloride (KLOR-CON) 10 MEQ tablet Take 1 tablet (10 mEq total) by mouth daily. 90 tablet 3   TRELEGY ELLIPTA 100-62.5-25 MCG/ACT AEPB Inhale 1 puff into the lungs daily.     No current facility-administered medications for this visit.    Allergies:  Patient has no known allergies.   Social History:  The patient  reports that he quit smoking about 9 years ago. His smoking use included cigarettes. He has never used smokeless tobacco. He reports current alcohol use. He reports that he does not use drugs.   Family History:  The patient's family history includes Breast cancer in his mother; Heart attack in his maternal grandfather and mother; Sleep apnea in his brother and brother; Uterine cancer in his sister.    ROS:  Please see the history of present illness.   Otherwise, review of systems is positive for none.   All other systems are reviewed and negative.    PHYSICAL EXAM: VS:  BP 126/70   Pulse 62   Ht 6\' 1"  (1.854 m)   Wt (!) 307 lb 6.4 oz (139.4 kg)   SpO2 92%   BMI 40.56 kg/m  , BMI Body mass index is 40.56 kg/m. GEN: Well nourished, well developed, in no acute distress  HEENT: normal  Neck: no JVD, carotid bruits, or masses Cardiac: RRR; no murmurs, rubs, or gallops,no edema  Respiratory:  clear to auscultation bilaterally, normal work of breathing GI: soft, nontender, nondistended, + BS MS: no deformity or  atrophy  Skin: warm and dry Neuro:  Strength and sensation are intact Psych: euthymic mood, full affect  EKG:  EKG is not ordered today. Personal review of the ekg ordered 07/31/22 shows atrial fibrillation  Recent Labs: 04/02/2022: Platelets 393 04/20/2022: Magnesium 2.2 05/21/2022: ALT 10; TSH 3.140 07/14/2022: BUN 20; Creatinine, Ser 0.60; Hemoglobin 14.3; Potassium 4.2; Sodium 139    Lipid Panel     Component Value Date/Time   CHOL 144 07/28/2022 0908   TRIG 88 07/28/2022 0908   HDL 42 07/28/2022 0908   CHOLHDL 3.4 07/28/2022 0908   CHOLHDL 4.6 03/15/2022 0408   VLDL 18 03/15/2022 0408   LDLCALC 85 07/28/2022 0908     Wt Readings from Last 3 Encounters:  08/21/22 (!) 307 lb 6.4 oz (139.4 kg)  07/31/22 (!) 309 lb 1.6 oz (140.2 kg)  07/22/22 (!) 313 lb 12.8 oz (142.3 kg)      Other studies Reviewed: Additional studies/ records that were reviewed today include: TTE 03/14/22  Review of the above records today demonstrates:    1. Left ventricular ejection fraction, by estimation, is 55 to 60%. The  left ventricle has normal function. The left ventricle has no regional  wall motion abnormalities. There is mild concentric left ventricular  hypertrophy. Left ventricular diastolic  parameters are consistent with Grade I diastolic dysfunction (impaired  relaxation).   2. Right ventricular systolic function is normal. The right ventricular  size is normal.   3. Left atrial size was severely dilated.   4. Right atrial size was moderately dilated.   5. The mitral valve is degenerative. Mild to moderate mitral valve  regurgitation.   6. The aortic valve is tricuspid. There is mild calcification of the  aortic valve. There is mild thickening of the aortic valve. Aortic valve  regurgitation is not visualized.   7. The inferior vena cava is normal in size with <50% respiratory  variability, suggesting right atrial pressure of 8 mmHg.    ASSESSMENT AND PLAN:  1.  Persistent atrial  fibrillation/flutter: Was previously on dofetilide.  We Ayansh Feutz switch to amiodarone at the time of CABG.  CHA2DS2-VASc of 5.  Currently on Eliquis 5 mg twice daily.  His left atrium is severely dilated.  He feels well today in  either slow atrial fibrillation or atrial flutter.  On auscultation he is quite regular.  I Melanye Hiraldo discuss with his primary electrophysiologist as to whether or not further rhythm control would be necessary.  We Maynor Mwangi also discuss the possibility of ablation versus stopping amiodarone and reloading on dofetilide.  2.  Secondary hypercoagulable state: Currently on Eliquis for atrial fibrillation as above  3.  Coronary artery disease: Status post CABG April 2023.  No current angina  4.  Chronic combined systolic and diastolic heart failure: Ejection fraction normal on most recent echo.  Plan per primary cardiology.    Current medicines are reviewed at length with the patient today.   The patient does not have concerns regarding his medicines.  The following changes were made today:  none  Labs/ tests ordered today include:  No orders of the defined types were placed in this encounter.    Disposition:   FU with [facility] pending discussion on rhythm control  Signed, Mikeisha Lemonds Meredith Leeds, MD  08/21/2022 1:52 PM     [place] [address] [place] [address] [phone] (office) [phone] (fax)

## 2022-09-02 DIAGNOSIS — H2513 Age-related nuclear cataract, bilateral: Secondary | ICD-10-CM | POA: Insufficient documentation

## 2022-09-08 ENCOUNTER — Telehealth: Payer: Self-pay | Admitting: Cardiology

## 2022-09-08 NOTE — Telephone Encounter (Signed)
Pt calling to f/u on possible Ablation that was discussed at last visit. Please advise

## 2022-09-08 NOTE — Telephone Encounter (Signed)
Pt aware that Dr. Curt Bears and Dr. Caryl Comes have not discussed case yet. Aware today was Dr. Olin Pia first day back after [place]. Aware I will be in touch once they had discussed. Patient verbalized understanding and agreeable to plan.

## 2022-09-23 ENCOUNTER — Telehealth: Payer: Self-pay | Admitting: Internal Medicine

## 2022-09-23 NOTE — Telephone Encounter (Signed)
Patient stated someone was supposed to get back with him regarding consulting with Dr. Caryl Comes and Dr. Curt Bears on next steps.

## 2022-09-23 NOTE — Telephone Encounter (Signed)
Pt called back and made aware that Dr. Curt Bears reached out to Dr. Caryl Comes, and that his Plan of Care is now pending.   Pt told if he has any concerns prior to this to call [facility] on [address].    Pt understood, but wants to be contacted when his Plan of care will start moving forward.  I told him I will make nursing aware of this.

## 2022-10-01 ENCOUNTER — Telehealth: Payer: Self-pay | Admitting: Internal Medicine

## 2022-10-01 NOTE — Telephone Encounter (Signed)
Made aware Dr. Curt Bears still has not heard back from Dr. Caryl Comes on the matter. Aware I am forwarding the call to Klein's nurse to follow up with him on this when he returns to the office next week. (Ablation date held for 02/18/23)

## 2022-10-01 NOTE — Telephone Encounter (Signed)
Patient called to follow-upon the status of his getting an ablation.

## 2022-10-08 NOTE — Telephone Encounter (Signed)
Dr Caryl Comes has reached out to Dr Curt Bears regarding ablation.  Pt is scheduled with Dr Curt Bears March 2024 for procedure.

## 2022-10-19 ENCOUNTER — Telehealth: Payer: Self-pay | Admitting: Internal Medicine

## 2022-10-19 NOTE — Telephone Encounter (Signed)
Patient is calling wanting to confirm the information of his ablation.

## 2022-10-19 NOTE — Telephone Encounter (Signed)
Spoke with pt and is awaiting confirmation of  02/2023 ablation and is willing to proceed as planned Pt also notes is willing to have done earlier if a date opens up ,Pt  would rather have done before end of year if at all possible Will forward to [place] for review ./cy

## 2022-10-20 NOTE — Telephone Encounter (Signed)
Pt aware he is on wait list and will be called if something does come open sooner. Aware I will still be in touch to schedule an appt w/ Dr. Curt Bears for beginning of next year closes to ablation date. Patient verbalized understanding and agreeable to plan.

## 2022-11-10 ENCOUNTER — Telehealth: Payer: Self-pay | Admitting: Internal Medicine

## 2022-11-10 NOTE — Telephone Encounter (Signed)
Patient is following up. He would like to confirm that his ablation is still scheduled for 01/20/23. Please advise.

## 2022-11-12 NOTE — Telephone Encounter (Signed)
Left message to call back  

## 2022-11-12 NOTE — Telephone Encounter (Signed)
Pt aware 02/18/23 is the ablation date that was held. Scheduled pt to come into the office on 02/04/23 for H&P and blood work in preparation for ablation. Aware we will send instructions on CT & ablation via mychart, but will go over those instructions when he comes in for visit in February. Patient verbalized understanding and agreeable to plan.

## 2022-11-25 ENCOUNTER — Ambulatory Visit: Payer: Medicare (Managed Care) | Admitting: Internal Medicine

## 2022-11-26 DIAGNOSIS — H02831 Dermatochalasis of right upper eyelid: Secondary | ICD-10-CM | POA: Insufficient documentation

## 2022-12-04 ENCOUNTER — Telehealth: Payer: Self-pay | Admitting: Internal Medicine

## 2022-12-04 NOTE — Telephone Encounter (Signed)
Called patient back regarding report of worsening SOB on exertion. Patient states he had been experiencing worsening SOB on exertion over the past few days. At the time of my call, he was not audibly SOB and was speaking normally. He stated he had just left a doctor's office where they diagnosed him with a sinus infection and prescribed antibiotics. Patient stated that at rest at doctor's office his SP02 was 97%. He also states he has taken all his medications today, to include his trelegy inhaler, singular and claritin.   Advised patient to rest as much as possible while he was clearing sinus infection as working hard to breathe can affect heart rate. Reviewed situations in which to call EMS, such as worsening SOB or chest pain that does not get better with rest.

## 2022-12-04 NOTE — Telephone Encounter (Signed)
Pt c/o Shortness Of Breath: STAT if SOB developed within the last 24 hours or pt is noticeably SOB on the phone  1. Are you currently SOB (can you hear that pt is SOB on the phone)?  No  2. How long have you been experiencing SOB?   About 3 days ago  3. Are you SOB when sitting or when up moving around?   When moving around  4. Are you currently experiencing any other symptoms? No.  Just heavy phlem   Patient called to report that his SOB is getting worse, he can hardly walk to his car without getting short of breath.  Patient stated he has gone to his doctor's office for a nasal infection.

## 2022-12-27 ENCOUNTER — Other Ambulatory Visit (HOSPITAL_COMMUNITY): Payer: Self-pay | Admitting: Nurse Practitioner

## 2022-12-30 ENCOUNTER — Telehealth: Payer: Self-pay | Admitting: Internal Medicine

## 2022-12-30 NOTE — Telephone Encounter (Signed)
New Message:     Patient wants to know if he still need the Echo, since he is having an Ablation in March?

## 2022-12-30 NOTE — Telephone Encounter (Signed)
Called patient, LVM to call back - left call back number.  We can push ECHO until after ablation.

## 2022-12-30 NOTE — Telephone Encounter (Signed)
Called patient, he states he would like to know if he needs ECHO on 01/23, I advised at last OV with Dr.Hilty he wanted to reassess LV function, patient was advised to keep this appointment of now, I would check with Dr.Hilty and let him know if anything different was recommended.   Thanks!

## 2023-01-05 ENCOUNTER — Other Ambulatory Visit (HOSPITAL_COMMUNITY): Payer: Medicare (Managed Care)

## 2023-01-06 NOTE — Telephone Encounter (Signed)
ECHO was cancelled.

## 2023-01-15 ENCOUNTER — Encounter (HOSPITAL_COMMUNITY): Payer: Self-pay

## 2023-01-15 ENCOUNTER — Encounter: Payer: Self-pay | Admitting: *Deleted

## 2023-01-15 ENCOUNTER — Telehealth: Payer: Self-pay | Admitting: *Deleted

## 2023-01-15 DIAGNOSIS — I4819 Other persistent atrial fibrillation: Secondary | ICD-10-CM

## 2023-01-15 DIAGNOSIS — Z01812 Encounter for preprocedural laboratory examination: Secondary | ICD-10-CM

## 2023-01-15 NOTE — Telephone Encounter (Signed)
Pt aware I am placing CT order today and someone will call him to schedule this pre ablation testing. Aware I will send CT and ablation instructions via mychart, but will review them at his 2/22 H&P OV. Patient verbalized understanding and agreeable to plan.

## 2023-01-21 ENCOUNTER — Encounter (HOSPITAL_COMMUNITY): Payer: Self-pay | Admitting: *Deleted

## 2023-01-24 ENCOUNTER — Encounter: Payer: Self-pay | Admitting: *Deleted

## 2023-01-25 ENCOUNTER — Other Ambulatory Visit: Payer: Self-pay

## 2023-01-25 ENCOUNTER — Telehealth (HOSPITAL_COMMUNITY): Payer: Self-pay | Admitting: Emergency Medicine

## 2023-01-25 ENCOUNTER — Other Ambulatory Visit: Payer: Medicare (Managed Care)

## 2023-01-25 DIAGNOSIS — Z01812 Encounter for preprocedural laboratory examination: Secondary | ICD-10-CM

## 2023-01-25 DIAGNOSIS — I4819 Other persistent atrial fibrillation: Secondary | ICD-10-CM

## 2023-01-25 LAB — CBC
Hematocrit: 44.7 % (ref 37.5–51.0)
Hemoglobin: 13.8 g/dL (ref 13.0–17.7)
MCH: 26.5 pg — ABNORMAL LOW (ref 26.6–33.0)
MCHC: 30.9 g/dL — ABNORMAL LOW (ref 31.5–35.7)
MCV: 86 fL (ref 79–97)
Platelets: 342 10*3/uL (ref 150–450)
RBC: 5.21 x10E6/uL (ref 4.14–5.80)
RDW: 13 % (ref 11.6–15.4)
WBC: 9 10*3/uL (ref 3.4–10.8)

## 2023-01-25 NOTE — Addendum Note (Signed)
Addended by: Stanton Kidney on: 01/25/2023 08:29 AM   Modules accepted: Orders

## 2023-01-25 NOTE — Telephone Encounter (Signed)
Late Entry: Spoke to pt last week, pt agreeable to moving up his ablation date to 2/15, and CT day before 2/14. He will stop by the office on Monday 2/12 for pre procedure blood work. Aware instruction letters will be sent to him this weekend. Pt agreeable to plan.

## 2023-01-25 NOTE — Telephone Encounter (Signed)
Reaching out to patient to offer assistance regarding upcoming cardiac imaging study; pt verbalizes understanding of appt date/time, parking situation and where to check in, pre-test NPO status and medications ordered, and verified current allergies; name and call back number provided for further questions should they arise Marchia Bond RN Navigator Cardiac Imaging [facility] [phone] office [phone] cell   Arrival 100 WC entrance Hold lasix Daily meds Denies iv issues Aware contrast

## 2023-01-26 LAB — BASIC METABOLIC PANEL
BUN/Creatinine Ratio: 23 (ref 10–24)
BUN: 23 mg/dL (ref 8–27)
CO2: 28 mmol/L (ref 20–29)
Calcium: 9.9 mg/dL (ref 8.6–10.2)
Chloride: 100 mmol/L (ref 96–106)
Creatinine, Ser: 1 mg/dL (ref 0.76–1.27)
Glucose: 135 mg/dL — ABNORMAL HIGH (ref 70–99)
Potassium: 4.4 mmol/L (ref 3.5–5.2)
Sodium: 144 mmol/L (ref 134–144)
eGFR: 84 mL/min/{1.73_m2} (ref >59)

## 2023-01-27 ENCOUNTER — Ambulatory Visit (HOSPITAL_COMMUNITY)
Admission: RE | Admit: 2023-01-27 | Discharge: 2023-01-27 | Disposition: A | Discharge status: Home / Self Care | Payer: Medicare (Managed Care) | Source: Ambulatory Visit | Attending: Cardiology | Admitting: Cardiology

## 2023-01-27 DIAGNOSIS — I4819 Other persistent atrial fibrillation: Secondary | ICD-10-CM | POA: Insufficient documentation

## 2023-01-27 MED ORDER — IOHEXOL 350 MG/ML SOLN
100.0000 mL | Freq: Once | INTRAVENOUS | Status: AC | PRN
  Administered 2023-01-27: 100 mL via INTRAVENOUS

## 2023-01-27 NOTE — Anesthesia Preprocedure Evaluation (Signed)
Anesthesia Evaluation  Patient identified by MRN, date of birth, ID band Patient awake    Reviewed: Allergy & Precautions, H&P , NPO status , Patient's Chart, lab work & pertinent test results, reviewed documented beta blocker date and time   Airway Mallampati: II  TM Distance: >3 FB Neck ROM: Full    Dental no notable dental hx. (+) Poor Dentition, Dental Advisory Given, Partial Upper,    Pulmonary neg pulmonary ROS, shortness of breath, COPD,  COPD inhaler, former smoker   Pulmonary exam normal breath sounds clear to auscultation       Cardiovascular hypertension, Pt. on medications and Pt. on home beta blockers + CAD, + Past MI and +CHF  + dysrhythmias Atrial Fibrillation + Valvular Problems/Murmurs MR  Rhythm:Irregular Rate:Normal  Echo 04/23: 1. Left ventricular ejection fraction, by estimation, is 55 to 60%. The  left ventricle has normal function. The left ventricle has no regional  wall motion abnormalities. There is mild concentric left ventricular  hypertrophy. Left ventricular diastolic  parameters are consistent with Grade I diastolic dysfunction (impaired  relaxation).  2. Right ventricular systolic function is normal. The right ventricular  size is normal.  3. Left atrial size was severely dilated.  4. Right atrial size was moderately dilated.  5. The mitral valve is degenerative. Mild to moderate mitral valve  regurgitation.  6. The aortic valve is tricuspid. There is mild calcification of the  aortic valve. There is mild thickening of the aortic valve. Aortic valve  regurgitation is not visualized.  7. The inferior vena cava is normal in size with <50% respiratory  variability, suggesting right atrial pressure of 8 mmHg.   Cath 04/23:   Prox LAD to Mid LAD lesion is 85% stenosed.   Prox RCA lesion is 90% stenosed.   The left ventricular systolic function is normal.   LV end diastolic pressure is  normal.    Neuro/Psych  Neuromuscular disease negative neurological ROS  negative psych ROS   GI/Hepatic negative GI ROS,,,(+)     substance abuse  alcohol use  Endo/Other  diabetes, Type 2  Morbid obesity  Renal/GU negative Renal ROS  negative genitourinary   Musculoskeletal negative musculoskeletal ROS (+)    Abdominal   Peds negative pediatric ROS (+)  Hematology negative hematology ROS (+)   Anesthesia Other Findings   Reproductive/Obstetrics negative OB ROS                             Anesthesia Physical Anesthesia Plan  ASA: 4  Anesthesia Plan: General   Post-op Pain Management: Minimal or no pain anticipated   Induction: Intravenous  PONV Risk Score and Plan: 2 and Propofol infusion and Treatment may vary due to age or medical condition  Airway Management Planned: Oral ETT  Additional Equipment: None  Intra-op Plan:   Post-operative Plan:   Informed Consent: I have reviewed the patients History and Physical, chart, labs and discussed the procedure including the risks, benefits and alternatives for the proposed anesthesia with the patient or authorized representative who has indicated his/her understanding and acceptance.     Dental advisory given  Plan Discussed with: CRNA and Anesthesiologist  Anesthesia Plan Comments:         Anesthesia Quick Evaluation

## 2023-01-28 ENCOUNTER — Other Ambulatory Visit (HOSPITAL_COMMUNITY): Payer: Self-pay

## 2023-01-28 ENCOUNTER — Other Ambulatory Visit: Payer: Self-pay

## 2023-01-28 ENCOUNTER — Ambulatory Visit (HOSPITAL_COMMUNITY)
Admission: RE | Admit: 2023-01-28 | Discharge: 2023-01-28 | Disposition: A | Discharge status: Home / Self Care | Payer: Medicare (Managed Care) | Attending: Cardiology | Admitting: Cardiology

## 2023-01-28 ENCOUNTER — Ambulatory Visit (HOSPITAL_BASED_OUTPATIENT_CLINIC_OR_DEPARTMENT_OTHER): Payer: Medicare (Managed Care) | Admitting: Anesthesiology

## 2023-01-28 ENCOUNTER — Ambulatory Visit (HOSPITAL_COMMUNITY): Payer: Medicare (Managed Care) | Admitting: Anesthesiology

## 2023-01-28 ENCOUNTER — Encounter (HOSPITAL_COMMUNITY)
Admission: RE | Disposition: A | Discharge status: Home / Self Care | Payer: Self-pay | Source: Home / Self Care | Attending: Cardiology

## 2023-01-28 DIAGNOSIS — Z951 Presence of aortocoronary bypass graft: Secondary | ICD-10-CM | POA: Diagnosis not present

## 2023-01-28 DIAGNOSIS — I5022 Chronic systolic (congestive) heart failure: Secondary | ICD-10-CM | POA: Insufficient documentation

## 2023-01-28 DIAGNOSIS — I2510 Atherosclerotic heart disease of native coronary artery without angina pectoris: Secondary | ICD-10-CM | POA: Insufficient documentation

## 2023-01-28 DIAGNOSIS — J449 Chronic obstructive pulmonary disease, unspecified: Secondary | ICD-10-CM | POA: Insufficient documentation

## 2023-01-28 DIAGNOSIS — I252 Old myocardial infarction: Secondary | ICD-10-CM | POA: Insufficient documentation

## 2023-01-28 DIAGNOSIS — Z87891 Personal history of nicotine dependence: Secondary | ICD-10-CM | POA: Diagnosis not present

## 2023-01-28 DIAGNOSIS — I509 Heart failure, unspecified: Secondary | ICD-10-CM | POA: Diagnosis not present

## 2023-01-28 DIAGNOSIS — I4819 Other persistent atrial fibrillation: Secondary | ICD-10-CM | POA: Insufficient documentation

## 2023-01-28 DIAGNOSIS — I484 Atypical atrial flutter: Secondary | ICD-10-CM | POA: Insufficient documentation

## 2023-01-28 DIAGNOSIS — I4891 Unspecified atrial fibrillation: Secondary | ICD-10-CM | POA: Diagnosis not present

## 2023-01-28 DIAGNOSIS — I110 Hypertensive heart disease with heart failure: Secondary | ICD-10-CM

## 2023-01-28 DIAGNOSIS — Z6841 Body Mass Index (BMI) 40.0 and over, adult: Secondary | ICD-10-CM | POA: Diagnosis not present

## 2023-01-28 HISTORY — PX: ATRIAL FIBRILLATION ABLATION: EP1191

## 2023-01-28 LAB — POCT ACTIVATED CLOTTING TIME
Activated Clotting Time: 233 seconds
Activated Clotting Time: 266 seconds
Activated Clotting Time: 309 seconds
Activated Clotting Time: 320 seconds

## 2023-01-28 SURGERY — ATRIAL FIBRILLATION ABLATION
Anesthesia: General

## 2023-01-28 MED ORDER — ONDANSETRON HCL 4 MG/2ML IJ SOLN: 4.0000 mg | Freq: Four times a day (QID) | INTRAMUSCULAR | Status: DC | PRN

## 2023-01-28 MED ORDER — SODIUM CHLORIDE 0.9% FLUSH: 3.0000 mL | Freq: Two times a day (BID) | INTRAVENOUS | Status: DC

## 2023-01-28 MED ORDER — APIXABAN 5 MG PO TABS
5.0000 mg | ORAL_TABLET | Freq: Once | ORAL | Status: AC
  Administered 2023-01-28: 5 mg via ORAL
  Filled 2023-01-28: qty 1

## 2023-01-28 MED ORDER — GLYCOPYRROLATE 0.2 MG/ML IJ SOLN
INTRAMUSCULAR | Status: DC | PRN
  Administered 2023-01-28: .4 mg via INTRAVENOUS

## 2023-01-28 MED ORDER — HEPARIN SODIUM (PORCINE) 1000 UNIT/ML IJ SOLN
INTRAMUSCULAR | Status: AC
  Filled 2023-01-28: qty 10

## 2023-01-28 MED ORDER — PROPOFOL 10 MG/ML IV BOLUS
INTRAVENOUS | Status: DC | PRN
  Administered 2023-01-28 (×2): 100 mg via INTRAVENOUS

## 2023-01-28 MED ORDER — ONDANSETRON HCL 4 MG/2ML IJ SOLN
INTRAMUSCULAR | Status: DC | PRN
  Administered 2023-01-28: 4 mg via INTRAVENOUS

## 2023-01-28 MED ORDER — SUGAMMADEX SODIUM 200 MG/2ML IV SOLN
INTRAVENOUS | Status: DC | PRN
  Administered 2023-01-28: 285.8 mg via INTRAVENOUS

## 2023-01-28 MED ORDER — SODIUM CHLORIDE 0.9% FLUSH
3.0000 mL | INTRAVENOUS | Status: DC | PRN
  Administered 2023-01-28: 3 mL via INTRAVENOUS

## 2023-01-28 MED ORDER — PHENYLEPHRINE HCL-NACL 20-0.9 MG/250ML-% IV SOLN
INTRAVENOUS | Status: DC | PRN
  Administered 2023-01-28: 30 ug/min via INTRAVENOUS

## 2023-01-28 MED ORDER — DEXAMETHASONE SODIUM PHOSPHATE 10 MG/ML IJ SOLN
INTRAMUSCULAR | Status: DC | PRN
  Administered 2023-01-28: 10 mg via INTRAVENOUS

## 2023-01-28 MED ORDER — MIDAZOLAM HCL 5 MG/5ML IJ SOLN
INTRAMUSCULAR | Status: DC | PRN
  Administered 2023-01-28: 2 mg via INTRAVENOUS

## 2023-01-28 MED ORDER — ROCURONIUM BROMIDE 10 MG/ML (PF) SYRINGE
PREFILLED_SYRINGE | INTRAVENOUS | Status: DC | PRN
  Administered 2023-01-28: 60 mg via INTRAVENOUS
  Administered 2023-01-28 (×2): 10 mg via INTRAVENOUS

## 2023-01-28 MED ORDER — FENTANYL CITRATE (PF) 250 MCG/5ML IJ SOLN
INTRAMUSCULAR | Status: DC | PRN
  Administered 2023-01-28 (×4): 25 ug via INTRAVENOUS

## 2023-01-28 MED ORDER — HEPARIN (PORCINE) IN NACL 1000-0.9 UT/500ML-% IV SOLN
INTRAVENOUS | Status: DC | PRN
  Administered 2023-01-28 (×4): 500 mL

## 2023-01-28 MED ORDER — COLCHICINE 0.6 MG PO TABS
0.6000 mg | ORAL_TABLET | Freq: Two times a day (BID) | ORAL | 0 refills | Status: DC
  Filled 2023-01-28: qty 10, 5d supply, fill #0

## 2023-01-28 MED ORDER — PROTAMINE SULFATE 10 MG/ML IV SOLN
INTRAVENOUS | Status: DC | PRN
  Administered 2023-01-28: 40 mg via INTRAVENOUS

## 2023-01-28 MED ORDER — ACETAMINOPHEN 325 MG PO TABS: 650.0000 mg | ORAL_TABLET | ORAL | Status: DC | PRN

## 2023-01-28 MED ORDER — SODIUM CHLORIDE 0.9 % IV SOLN: INTRAVENOUS | Status: DC

## 2023-01-28 MED ORDER — HEPARIN SODIUM (PORCINE) 1000 UNIT/ML IJ SOLN
INTRAMUSCULAR | Status: DC | PRN
  Administered 2023-01-28: 8000 [IU] via INTRAVENOUS
  Administered 2023-01-28: 4000 [IU] via INTRAVENOUS
  Administered 2023-01-28: 15000 [IU] via INTRAVENOUS
  Administered 2023-01-28: 8000 [IU] via INTRAVENOUS
  Administered 2023-01-28: 3000 [IU] via INTRAVENOUS

## 2023-01-28 MED ORDER — FUROSEMIDE 10 MG/ML IJ SOLN
40.0000 mg | Freq: Once | INTRAMUSCULAR | Status: AC
  Administered 2023-01-28: 40 mg via INTRAVENOUS
  Filled 2023-01-28: qty 4

## 2023-01-28 MED ORDER — SODIUM CHLORIDE 0.9 % IV SOLN: 250.0000 mL | INTRAVENOUS | Status: DC | PRN

## 2023-01-28 MED ORDER — NEOSTIGMINE METHYLSULFATE 10 MG/10ML IV SOLN
INTRAVENOUS | Status: DC | PRN
  Administered 2023-01-28: 3 mg via INTRAVENOUS

## 2023-01-28 SURGICAL SUPPLY — 21 items
BAG SNAP BAND KOVER 36X36 (MISCELLANEOUS) IMPLANT
CATH 8FR REPROCESSED SOUNDSTAR (CATHETERS) ×1 IMPLANT
CATH 8FR SOUNDSTAR REPROCESSED (CATHETERS) IMPLANT
CATH ABLAT QDOT MICRO BI TC DF (CATHETERS) IMPLANT
CATH OCTARAY 2.0 F 3-3-3-3-3 (CATHETERS) IMPLANT
CATH PIGTAIL STEERABLE D1 8.7 (WIRE) IMPLANT
CATH S-M CIRCA TEMP PROBE (CATHETERS) IMPLANT
CATH WEB BI DIR CSDF CRV REPRO (CATHETERS) IMPLANT
CLOSURE PERCLOSE PROSTYLE (VASCULAR PRODUCTS) IMPLANT
COVER SWIFTLINK CONNECTOR (BAG) ×1 IMPLANT
PACK EP LATEX FREE (CUSTOM PROCEDURE TRAY) ×1
PACK EP LF (CUSTOM PROCEDURE TRAY) ×1 IMPLANT
PAD DEFIB RADIO PHYSIO CONN (PAD) ×1 IMPLANT
PATCH CARTO3 (PAD) IMPLANT
SHEATH BAYLIS TRANSSEPTAL 98CM (NEEDLE) IMPLANT
SHEATH CARTO VIZIGO SM CVD (SHEATH) IMPLANT
SHEATH PINNACLE 7F 10CM (SHEATH) IMPLANT
SHEATH PINNACLE 8F 10CM (SHEATH) IMPLANT
SHEATH PINNACLE 9F 10CM (SHEATH) IMPLANT
SHEATH PROBE COVER 6X72 (BAG) IMPLANT
TUBING SMART ABLATE COOLFLOW (TUBING) IMPLANT

## 2023-01-28 NOTE — Transfer of Care (Signed)
Immediate Anesthesia Transfer of Care Note  Patient: Arthur Church  Procedure(s) Performed: ATRIAL FIBRILLATION ABLATION  Patient Location: PACU and Cath Lab  Anesthesia Type:General  Level of Consciousness: awake, alert , and oriented  Airway & Oxygen Therapy: Patient Spontanous Breathing and Patient connected to face mask oxygen  Post-op Assessment: Report given to RN, Post -op Vital signs reviewed and stable, Patient moving all extremities X 4, and Patient able to stick tongue midline  Post vital signs: Reviewed  Last Vitals:  Vitals Value Taken Time  BP 123/66 01/28/23 1058  Temp 97.6   Pulse 73 01/28/23 1058  Resp 16 01/28/23 1058  SpO2 96 % 01/28/23 1058  Vitals shown include unvalidated device data.  Last Pain:  Vitals:   01/28/23 0547  TempSrc:   PainSc: 0-No pain         Complications: There were no known notable events for this encounter.

## 2023-01-28 NOTE — Progress Notes (Signed)
Spoke with Dr. Ambrose Pancoast, updated on pt status concerning oxygen and Co2 levels, ok to move pt to Short Stay and change pt back to Arthur Church (6L placed), Dr Ambrose Pancoast will follow up with pt in Short Stay, also made aware that pt has dried blood on lips/tongue, cleaned with wash cloth and water given to drink, safety maintained

## 2023-01-28 NOTE — Anesthesia Procedure Notes (Signed)
Procedure Name: Intubation Date/Time: 01/28/2023 7:47 AM  Performed by: Maude Leriche, CRNAPre-anesthesia Checklist: Patient identified, Emergency Drugs available, Suction available and Patient being monitored Patient Re-evaluated:Patient Re-evaluated prior to induction Oxygen Delivery Method: Circle system utilized Preoxygenation: Pre-oxygenation with 100% oxygen Induction Type: IV induction Ventilation: Two handed mask ventilation required Laryngoscope Size: Miller and 3 Grade View: Grade I Tube type: Oral Tube size: 7.5 mm Number of attempts: 1 Airway Equipment and Method: Stylet and Bite block Placement Confirmation: ETT inserted through vocal cords under direct vision, positive ETCO2 and breath sounds checked- equal and bilateral Secured at: 23 cm Tube secured with: Tape Dental Injury: Teeth and Oropharynx as per pre-operative assessment

## 2023-01-28 NOTE — Progress Notes (Addendum)
Dr Ambrose Pancoast to bedside, to be updated in approximately 30 minutes

## 2023-01-28 NOTE — Progress Notes (Addendum)
Dr Ambrose Pancoast notified of pts increased Co2 levels and oxygen status, RN to place pt back on NRB mask, see flowsheets, Dr Eligha Bridegroom to see pt, safety maintained

## 2023-01-28 NOTE — Progress Notes (Signed)
Called Lab 4, Spoke with LB, RN and explained increased Co2 levels, and that anesthesiologist notified, LB to inform Dr. Curt Bears

## 2023-01-28 NOTE — H&P (Signed)
Electrophysiology Office Note   Date:  01/28/2023   ID:  Arthur Church, DOB [birth year], MRN [id number]  PCP:  Gerald Leitz  Cardiologist:  Debara Pickett Primary Electrophysiologist: Meridee Score, MD    Chief Complaint: AF   History of Present Illness: Arthur Church is a 67 y.o. male who is being seen today for the evaluation of AF at the request of No ref. provider found. Presenting today for electrophysiology evaluation.  Is a history seen for atrial fibrillation, coronary artery disease, alcohol abuse, hypertension, morbid obesity, prediabetes.  He is status post CABG April 2023.  He was previously on Tikosyn, but this was stopped and he was started on amiodarone.  He had a cardioversion June 2023, but went back into atrial flutter with 4-1 conduction.  There are some days that he feels poorly, fatigued and short of breath, others that he feels well without complaints.  He felt quite poorly 3 months after his cardioversion, but today feels well and he has felt well over the last few weeks.  Today, denies symptoms of palpitations, chest pain, shortness of breath, orthopnea, PND, lower extremity edema, claudication, dizziness, presyncope, syncope, bleeding, or neurologic sequela. The patient is tolerating medications without difficulties. Plan ablation today.    Past Medical History:  Diagnosis Date   Chronic systolic CHF (congestive heart failure) (HCC)    a. 2D ECHO (03/06/15) EF 20-25%, diffuse HK. Asc aortic diameter: 28m. Mild LA dilation, mild RV dilation. Mild RV systolic dysfunction   b. LifeVest placed on 02/2015 admissoin    Coronary artery disease    a. 70% LAD lesion   Elevated TSH    ETOH abuse    Hypertension    Morbid obesity ([facility]    a. BMI ~46   Persistent atrial fibrillation ([facility]    a. newly dx 02/2015 admission. s/p successful TEE/DCCV  b. on Eliquis   Pre-diabetes    a. HgA1c 6.1   Past Surgical History:  Procedure Laterality Date    CARDIAC CATHETERIZATION N/A 04/26/2015   Procedure: Left Heart Cath and Coronary Angiography;  Surgeon: HBelva Crome MD;  [facility];  Service: Cardiovascular;  Laterality: N/A;   CARDIOVERSION N/A 03/06/2015   Procedure: CARDIOVERSION;  Surgeon: DLarey Dresser MD;  [facility]  Service: Cardiovascular;  Laterality: N/A;   CARDIOVERSION N/A 06/02/2022   Procedure: CARDIOVERSION;  Surgeon: NThayer Headings MD;  [facility] [facility]  Service: Cardiovascular;  Laterality: N/A;   CARDIOVERSION N/A 07/14/2022   Procedure: CARDIOVERSION;  Surgeon: CSanda Klein MD;  [place] [facility]  Service: Cardiovascular;  Laterality: N/A;   CORONARY ANGIOGRAPHY N/A 03/18/2022   Procedure: CORONARY ANGIOGRAPHY;  Surgeon: MBurnell Blanks MD;  [facility];  Service: Cardiovascular;  Laterality: N/A;   CORONARY ARTERY BYPASS GRAFT N/A 03/24/2022   Procedure: CORONARY ARTERY BYPASS GRAFTING (CABG) TIMES TWO USING LEFT INTERNAL MAMMARY ARTERY AND ENDOSCOPICALLY HARVESTED RIGHT GREATER SAPHENOUS VEIN;  Surgeon: VDahlia Byes MD;  [place] [facility]  Service: Open Heart Surgery;  Laterality: N/A;   ENDOVEIN HARVEST OF GREATER SAPHENOUS VEIN Right 03/24/2022   Procedure: ENDOVEIN HARVEST OF GREATER SAPHENOUS VEIN;  Surgeon: VDahlia Byes MD;  [place] [facility]  Service: Open Heart Surgery;  Laterality: Right;   HERNIA REPAIR     LEFT HEART CATH N/A 03/16/2022   Procedure: Left Heart Cath;  Surgeon: KDixie Dials MD;  [facility];  Service: Cardiovascular;  Laterality:  N/A;   TEE WITHOUT CARDIOVERSION N/A 03/06/2015   Procedure: TRANSESOPHAGEAL ECHOCARDIOGRAM (TEE);  Surgeon: Larey Dresser, MD;  Location: [place];  Service: Cardiovascular;  Laterality: N/A;   TEE WITHOUT CARDIOVERSION N/A 05/02/2015   Procedure: TRANSESOPHAGEAL ECHOCARDIOGRAM (TEE);  Surgeon: Lelon Perla, MD;  Location[facility];  Service: Cardiovascular;   Laterality: N/A;   TEE WITHOUT CARDIOVERSION N/A 03/24/2022   Procedure: TRANSESOPHAGEAL ECHOCARDIOGRAM (TEE);  Surgeon: Dahlia Byes, MD;  Location: [place];  Service: Open Heart Surgery;  Laterality: N/A;     Current Facility-Administered Medications  Medication Dose Route Frequency Provider Last Rate Last Admin   0.9 %  sodium chloride infusion   Intravenous Continuous Malgorzata Albert, Ocie Doyne, MD 50 mL/hr at 01/28/23 0700 Continued from Pre-op at 01/28/23 0700    Allergies:   Patient has no known allergies.   Social History:  The patient  reports that he quit smoking about 10 years ago. His smoking use included cigarettes. He has never used smokeless tobacco. He reports current alcohol use. He reports that he does not use drugs.   Family History:  The patient's family history includes Breast cancer in his mother; Heart attack in his maternal grandfather and mother; Sleep apnea in his brother and brother; Uterine cancer in his sister.   ROS:  Please see the history of present illness.   Otherwise, review of systems is positive for none.   All other systems are reviewed and negative.   PHYSICAL EXAM: VS:  BP (!) 140/74   Pulse 65   Temp (!) 96.8 F (36 C) (Temporal)   Resp 16   Ht 6' 1"$  (1.854 m)   Wt (!) 142.9 kg   SpO2 90%   BMI 41.56 kg/m  , BMI Body mass index is 41.56 kg/m. GEN: Well nourished, well developed, in no acute distress  HEENT: normal  Neck: no JVD, carotid bruits, or masses Cardiac: iRRR; no murmurs, rubs, or gallops,no edema  Respiratory:  clear to auscultation bilaterally, normal work of breathing GI: soft, nontender, nondistended, + BS MS: no deformity or atrophy  Skin: warm and dry Neuro:  Strength and sensation are intact Psych: euthymic mood, full affect   Recent Labs: 04/20/2022: Magnesium 2.2 05/21/2022: ALT 10; TSH 3.140 01/25/2023: BUN 23; Creatinine, Ser 1.00; Hemoglobin 13.8; Platelets 342; Potassium 4.4; Sodium 144    Lipid Panel     Component  Value Date/Time   CHOL 144 07/28/2022 0908   TRIG 88 07/28/2022 0908   HDL 42 07/28/2022 0908   CHOLHDL 3.4 07/28/2022 0908   CHOLHDL 4.6 03/15/2022 0408   VLDL 18 03/15/2022 0408   LDLCALC 85 07/28/2022 0908     Wt Readings from Last 3 Encounters:  01/28/23 (!) 142.9 kg  08/21/22 (!) 139.4 kg  07/31/22 (!) 140.2 kg      Other studies Reviewed: Additional studies/ records that were reviewed today include: TTE 03/14/22  Review of the above records today demonstrates:    1. Left ventricular ejection fraction, by estimation, is 55 to 60%. The  left ventricle has normal function. The left ventricle has no regional  wall motion abnormalities. There is mild concentric left ventricular  hypertrophy. Left ventricular diastolic  parameters are consistent with Grade I diastolic dysfunction (impaired  relaxation).   2. Right ventricular systolic function is normal. The right ventricular  size is normal.   3. Left atrial size was severely dilated.   4. Right atrial size was moderately dilated.   5. The mitral  valve is degenerative. Mild to moderate mitral valve  regurgitation.   6. The aortic valve is tricuspid. There is mild calcification of the  aortic valve. There is mild thickening of the aortic valve. Aortic valve  regurgitation is not visualized.   7. The inferior vena cava is normal in size with <50% respiratory  variability, suggesting right atrial pressure of 8 mmHg.    ASSESSMENT AND PLAN:  1.  Persistent atrial fibrillation/flutter: Arthur Church has presented today for surgery, with the diagnosis of AF.  The various methods of treatment have been discussed with the patient and family. After consideration of risks, benefits and other options for treatment, the patient has consented to  Procedure(s): Catheter ablation as a surgical intervention .  Risks include but not limited to complete heart block, stroke, esophageal damage, nerve damage, bleeding, vascular damage,  tamponade, perforation, MI, and death. The patient's history has been reviewed, patient examined, no change in status, stable for surgery.  I have reviewed the patient's chart and labs.  Questions were answered to the patient's satisfaction.    Anjelita Sheahan Curt Bears, MD 01/28/2023 7:07 AM

## 2023-01-28 NOTE — Anesthesia Postprocedure Evaluation (Signed)
Anesthesia Post Note  Patient: Arthur Church  Procedure(s) Performed: ATRIAL FIBRILLATION ABLATION     Patient location during evaluation: PACU Anesthesia Type: General Level of consciousness: awake and alert Pain management: pain level controlled Vital Signs Assessment: post-procedure vital signs reviewed and stable Respiratory status: spontaneous breathing, nonlabored ventilation, respiratory function stable and patient connected to nasal cannula oxygen Cardiovascular status: blood pressure returned to baseline and stable Postop Assessment: no apparent nausea or vomiting Anesthetic complications: no   There were no known notable events for this encounter.  Last Vitals:  Vitals:   01/28/23 1230 01/28/23 1245  BP: 131/61 126/69  Pulse: 75 74  Resp: 16 (!) 21  Temp:    SpO2: 90% 91%    Last Pain:  Vitals:   01/28/23 1241  TempSrc:   PainSc: 0-No pain                 Sevannah Madia

## 2023-01-28 NOTE — Discharge Instructions (Addendum)
Cardiac Ablation, Care After  This sheet gives you information about how to care for yourself after your procedure. Your health care provider may also give you more specific instructions. If you have problems or questions, contact your health care provider. What can I expect after the procedure? After the procedure, it is common to have: Bruising around your puncture site. Tenderness around your puncture site. Skipped heartbeats. If you had an atrial fibrillation ablation, you may have atrial fibrillation during the first several months after your procedure.  Tiredness (fatigue).  Follow these instructions at home: Puncture site care  Follow instructions from your health care provider about how to take care of your puncture site. Make sure you: If present, leave stitches (sutures), skin glue, or adhesive strips in place. These skin closures may need to stay in place for up to 2 weeks. If adhesive strip edges start to loosen and curl up, you may trim the loose edges. Do not remove adhesive strips completely unless your health care provider tells you to do that. If a large square bandage is present, this may be removed 24 hours after surgery.  Check your puncture site every day for signs of infection. Check for: Redness, swelling, or pain. Fluid or blood. If your puncture site starts to bleed, lie down on your back, apply firm pressure to the area, and contact your health care provider. Warmth. Pus or a bad smell. A pea or small marble sized lump at the site is normal and can take up to three months to resolve.  Driving Do not drive for at least 4 days after your procedure or however long your health care provider recommends. (Do not resume driving if you have previously been instructed not to drive for other health reasons.) Do not drive or use heavy machinery while taking prescription pain medicine. Activity Avoid activities that take a lot of effort for at least 7 days after your  procedure. Do not lift anything that is heavier than 5 lb (4.5 kg) for one week.  No sexual activity for 1 week.  Return to your normal activities as told by your health care provider. Ask your health care provider what activities are safe for you. General instructions Take over-the-counter and prescription medicines only as told by your health care provider. Do not use any products that contain nicotine or tobacco, such as cigarettes and e-cigarettes. If you need help quitting, ask your health care provider. You may shower after 24 hours, but Do not take baths, swim, or use a hot tub for 1 week.  Do not drink alcohol for 24 hours after your procedure. Keep all follow-up visits as told by your health care provider. This is important. Contact a health care provider if: You have redness, mild swelling, or pain around your puncture site. You have fluid or blood coming from your puncture site that stops after applying firm pressure to the area. Your puncture site feels warm to the touch. You have pus or a bad smell coming from your puncture site. You have a fever. You have chest pain or discomfort that spreads to your neck, jaw, or arm. You have chest pain that is worse with lying on your back or taking a deep breath. You are sweating a lot. You feel nauseous. You have a fast or irregular heartbeat. You have shortness of breath. You are dizzy or light-headed and feel the need to lie down. You have pain or numbness in the arm or leg closest to your puncture  site. Get help right away if: Your puncture site suddenly swells. Your puncture site is bleeding and the bleeding does not stop after applying firm pressure to the area. These symptoms may represent a serious problem that is an emergency. Do not wait to see if the symptoms will go away. Get medical help right away. Call your local emergency services (911 in the [place] Do not drive yourself to the hospital. Summary After the procedure, it  is normal to have bruising and tenderness at the puncture site in your groin, neck, or forearm. Check your puncture site every day for signs of infection. Get help right away if your puncture site is bleeding and the bleeding does not stop after applying firm pressure to the area. This is a medical emergency. This information is not intended to replace advice given to you by your health care provider. Make sure you discuss any questions you have with your health care provider.       Femoral Site Care This sheet gives you information about how to care for yourself after your procedure. Your health care provider may also give you more specific instructions. If you have problems or questions, contact your health care provider. What can I expect after the procedure?  After the procedure, it is common to have: Bruising that usually fades within 1-2 weeks. Tenderness at the site. Follow these instructions at home: Wound care Follow instructions from your health care provider about how to take care of your insertion site. Make sure you: Wash your hands with soap and water before you change your bandage (dressing). If soap and water are not available, use hand sanitizer. Remove your dressing as told by your health care provider. In 24 hours Do not take baths, swim, or use a hot tub until your health care provider approves. You may shower 24-48 hours after the procedure or as told by your health care provider. Gently wash the site with plain soap and water. Pat the area dry with a clean towel. Do not rub the site. This may cause bleeding. Do not apply powder or lotion to the site. Keep the site clean and dry. Check your femoral site every day for signs of infection. Check for: Redness, swelling, or pain. Fluid or blood. Warmth. Pus or a bad smell. Activity For the first 2-3 days after your procedure, or as long as directed: Avoid climbing stairs as much as possible. Do not squat. Do not  lift anything that is heavier than 10 lb (4.5 kg), or the limit that you are told, until your health care provider says that it is safe. For 5 days Rest as directed. Avoid sitting for a long time without moving. Get up to take short walks every 1-2 hours. Do not drive for 24 hours if you were given a medicine to help you relax (sedative). General instructions Take over-the-counter and prescription medicines only as told by your health care provider. Keep all follow-up visits as told by your health care provider. This is important. Contact a health care provider if you have: A fever or chills. You have redness, swelling, or pain around your insertion site. Get help right away if: The catheter insertion area swells very fast. You pass out. You suddenly start to sweat or your skin gets clammy. The catheter insertion area is bleeding, and the bleeding does not stop when you hold steady pressure on the area. The area near or just beyond the catheter insertion site becomes pale, cool, tingly, or numb.  These symptoms may represent a serious problem that is an emergency. Do not wait to see if the symptoms will go away. Get medical help right away. Call your local emergency services (911 in the [place] Do not drive yourself to the hospital. Summary After the procedure, it is common to have bruising that usually fades within 1-2 weeks. Check your femoral site every day for signs of infection. Do not lift anything that is heavier than 10 lb (4.5 kg), or the limit that you are told, until your health care provider says that it is safe. This information is not intended to replace advice given to you by your health care provider. Make sure you discuss any questions you have with your health care provider. Document Revised: 12/13/2017 Document Reviewed: 12/13/2017 Elsevier Patient Education  2020 [employer].

## 2023-01-29 ENCOUNTER — Encounter (HOSPITAL_COMMUNITY): Payer: Self-pay | Admitting: Cardiology

## 2023-01-29 ENCOUNTER — Telehealth (HOSPITAL_COMMUNITY): Payer: Self-pay | Admitting: *Deleted

## 2023-01-29 NOTE — Telephone Encounter (Signed)
Pt called in stating he has been more short of breath today and has increase in swelling. Pt did not weigh himself this morning as he is staying with his daughter. Pt will take an extra dose of lasix now and will repeat in AM if weight/SOB has not improved. Pt will call on Monday if not back to baseline for assessment.

## 2023-02-04 ENCOUNTER — Ambulatory Visit: Payer: Medicare (Managed Care) | Admitting: Student

## 2023-02-10 ENCOUNTER — Ambulatory Visit: Payer: Medicare (Managed Care) | Attending: Internal Medicine | Admitting: Internal Medicine

## 2023-02-10 ENCOUNTER — Encounter: Payer: Self-pay | Admitting: Internal Medicine

## 2023-02-10 VITALS — BP 126/62 | HR 62 | Ht 73.0 in | Wt 321.4 lb

## 2023-02-10 DIAGNOSIS — I4819 Other persistent atrial fibrillation: Secondary | ICD-10-CM

## 2023-02-10 DIAGNOSIS — Z951 Presence of aortocoronary bypass graft: Secondary | ICD-10-CM | POA: Diagnosis not present

## 2023-02-10 DIAGNOSIS — N521 Erectile dysfunction due to diseases classified elsewhere: Secondary | ICD-10-CM

## 2023-02-10 DIAGNOSIS — I255 Ischemic cardiomyopathy: Secondary | ICD-10-CM | POA: Diagnosis not present

## 2023-02-10 MED ORDER — SILDENAFIL CITRATE 20 MG PO TABS: 40.0000 mg | ORAL_TABLET | ORAL | 1 refills | Status: DC | PRN
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
## 2023-02-10 NOTE — Patient Instructions (Signed)
Medication Instructions:  Dr. Debara Pickett has prescribed sildenafil (revatio) as needed. This was sent to [facility]. There is a mail order pharmacy out of [facility]) that offers cash-pay price of $120 for 50 tablets  *If you need a refill on your cardiac medications before your next appointment, please call your pharmacy*   Testing/Procedures: Your physician has requested that you have an echocardiogram. Echocardiography is a painless test that uses sound waves to create images of your heart. It provides your doctor with information about the size and shape of your heart and how well your heart's chambers and valves are working. This procedure takes approximately one hour. There are no restrictions for this procedure. Please do NOT wear cologne, perfume, aftershave, or lotions (deodorant is allowed). Please arrive 15 minutes prior to your appointment time. DUE: late August/early September 2024   Follow-Up: At [facility], you and your health needs are our priority.  As part of our continuing mission to provide you with exceptional heart care, we have created designated Provider Care Teams.  These Care Teams include your primary Cardiologist (physician) and Advanced Practice Providers (APPs -  Physician Assistants and Nurse Practitioners) who all work together to provide you with the care you need, when you need it.  We recommend signing up for the patient portal called "MyChart".  Sign up information is provided on this After Visit Summary.  MyChart is used to connect with patients for Virtual Visits (Telemedicine).  Patients are able to view lab/test results, encounter notes, upcoming appointments, etc.  Non-urgent messages can be sent to your provider as well.   To learn more about what you can do with MyChart, go to [email or web address].    Your next appointment:   6 months -- after echo

## 2023-02-11 ENCOUNTER — Ambulatory Visit (HOSPITAL_COMMUNITY): Payer: Medicare (Managed Care)

## 2023-02-19 ENCOUNTER — Telehealth: Payer: Self-pay

## 2023-02-19 ENCOUNTER — Other Ambulatory Visit (HOSPITAL_COMMUNITY): Payer: Self-pay

## 2023-02-19 NOTE — Telephone Encounter (Signed)
Pharmacy Patient Advocate Encounter   Received notification from [place] that prior authorization for SILDENAFIL 20 MG is needed.    PA submitted on 02/19/23 Key [id number] Status is pending  Karie Soda, CPhT Pharmacy Patient Advocate Specialist Direct Number: [phone] Fax: [phone]

## 2023-02-22 NOTE — Telephone Encounter (Signed)
Pharmacy Patient Advocate Encounter  Received notification from [place]  that the request for prior authorization for SILDENAFIL 20 MG  has been denied due to .     Patient can get this with a good Rx coupon   Karie Soda, [place] Patient Advocate Specialist Direct Number: [phone] Fax: [phone]

## 2023-03-04 ENCOUNTER — Institutional Professional Consult (permissible substitution): Payer: Medicare (Managed Care) | Admitting: Pulmonary Disease

## 2023-03-11 ENCOUNTER — Other Ambulatory Visit (HOSPITAL_COMMUNITY): Payer: Medicare (Managed Care)

## 2023-03-18 ENCOUNTER — Ambulatory Visit (HOSPITAL_COMMUNITY)
Admission: RE | Admit: 2023-03-18 | Discharge: 2023-03-18 | Disposition: A | Discharge status: Home / Self Care | Payer: Medicare (Managed Care) | Source: Ambulatory Visit | Attending: Physician Assistant | Admitting: Physician Assistant

## 2023-03-18 VITALS — BP 130/68 | HR 56 | Ht 73.0 in | Wt 331.0 lb

## 2023-03-18 DIAGNOSIS — D6869 Other thrombophilia: Secondary | ICD-10-CM | POA: Diagnosis not present

## 2023-03-18 DIAGNOSIS — Z5181 Encounter for therapeutic drug level monitoring: Secondary | ICD-10-CM | POA: Diagnosis not present

## 2023-03-18 DIAGNOSIS — I4819 Other persistent atrial fibrillation: Secondary | ICD-10-CM | POA: Insufficient documentation

## 2023-03-18 DIAGNOSIS — I491 Atrial premature depolarization: Secondary | ICD-10-CM | POA: Diagnosis not present

## 2023-03-18 DIAGNOSIS — Z79899 Other long term (current) drug therapy: Secondary | ICD-10-CM

## 2023-03-18 NOTE — Progress Notes (Signed)
Primary Care Physician: Gerald Leitz Referring Physician: Dr. Caryl Comes  Cardiologist: Dr. Janne Napoleon is a 67 y.o. male with a h/o afib on Tikosyn that had CABG in April and had afib at that time. His longstanding tikosyn was stopped and he was placed on amiodarone. He was seen by  Dr. Caryl Comes in June for return of afib and had a successful cardioversion 6/20 but back in atrial flutter with 4:1 conduction today. He feels fatigue but no palpitations. He states that he came down with an URI ( that he caught from one of the grand kids) right after the cardioversion. He is still coughing but much improved and mucus is clear.   F/u in afib clinic, 07/22/22. He had successful cardioversion but had ERAF. Prior cardioversion 6/20 scheduled by Dr. Caryl Comes also with ERAF. He has several things working to encourage afib, severely dilated L atrium, morbid obesity and untreated sleep apnea, per pt his machine was taken away as he did not fit criteria to use  it any longer.  He was put on amiodarone after by pass April of this year as tikosyn failed to keep him in rhythm. Dr. Caryl Comes mentioned possibly stopping amiodarone and after washout retrying Tikosyn. I don't think he is an optimal ablation candide at this point, but will refer to EP to discuss if ablation may be possible . Discussed with pt no quick fix as if we stopped amiodarone today, it may take 2-3 months before amio washout.  Follow up in the AF clinic 03/18/23. Patient is s/p afib and flutter ablation with Dr Curt Bears on 01/28/23. He reports that he has done well since the procedure. He has noticed some "unclassified" readings on his Kardia mobile as well as some afib. The episodes do not last long. He is in SR today.   Today, he denies symptoms of palpitations, chest pain, shortness of breath, orthopnea, PND, lower extremity edema, dizziness, presyncope, syncope, or neurologic sequela. The patient is tolerating medications without  difficulties and is otherwise without complaint today.   Past Medical History:  Diagnosis Date   Chronic systolic CHF (congestive heart failure) (HCC)    a. 2D ECHO (03/06/15) EF 20-25%, diffuse HK. Asc aortic diameter: 25mm. Mild LA dilation, mild RV dilation. Mild RV systolic dysfunction   b. LifeVest placed on 02/2015 admissoin    Coronary artery disease    a. 70% LAD lesion   Elevated TSH    ETOH abuse    Hypertension    Morbid obesity ([place])    a. BMI ~46   Persistent atrial fibrillation ([place])    a. newly dx 02/2015 admission. s/p successful TEE/DCCV  b. on Eliquis   Pre-diabetes    a. HgA1c 6.1   Past Surgical History:  Procedure Laterality Date   ATRIAL FIBRILLATION ABLATION N/A 01/28/2023   Procedure: ATRIAL FIBRILLATION ABLATION;  Surgeon: Constance Haw, MD;  Location: [place] [facility];  Service: Cardiovascular;  Laterality: N/A;   CARDIAC CATHETERIZATION N/A 04/26/2015   Procedure: Left Heart Cath and Coronary Angiography;  Surgeon: Belva Crome, MD;  Location: [place] [facility];  Service: Cardiovascular;  Laterality: N/A;   CARDIOVERSION N/A 03/06/2015   Procedure: CARDIOVERSION;  Surgeon: Larey Dresser, MD;  Location: [place];  Service: Cardiovascular;  Laterality: N/A;   CARDIOVERSION N/A 06/02/2022   Procedure: CARDIOVERSION;  Surgeon: Thayer Headings, MD;  Location: [facility];  Service: Cardiovascular;  Laterality: N/A;   CARDIOVERSION N/A 07/14/2022  Procedure: CARDIOVERSION;  Surgeon: Sanda Klein, MD;  Location: [place];  Service: Cardiovascular;  Laterality: N/A;   CORONARY ANGIOGRAPHY N/A 03/18/2022   Procedure: CORONARY ANGIOGRAPHY;  Surgeon: Burnell Blanks, MD;  Location: [place] [facility];  Service: Cardiovascular;  Laterality: N/A;   CORONARY ARTERY BYPASS GRAFT N/A 03/24/2022   Procedure: CORONARY ARTERY BYPASS GRAFTING (CABG) TIMES TWO USING LEFT INTERNAL MAMMARY ARTERY AND ENDOSCOPICALLY HARVESTED RIGHT GREATER SAPHENOUS VEIN;   Surgeon: Dahlia Byes, MD;  Location: [place];  Service: Open Heart Surgery;  Laterality: N/A;   ENDOVEIN HARVEST OF GREATER SAPHENOUS VEIN Right 03/24/2022   Procedure: ENDOVEIN HARVEST OF GREATER SAPHENOUS VEIN;  Surgeon: Dahlia Byes, MD;  Location: [place];  Service: Open Heart Surgery;  Laterality: Right;   HERNIA REPAIR     LEFT HEART CATH N/A 03/16/2022   Procedure: Left Heart Cath;  Surgeon: Dixie Dials, MD;  Location: [place] [facility];  Service: Cardiovascular;  Laterality: N/A;   TEE WITHOUT CARDIOVERSION N/A 03/06/2015   Procedure: TRANSESOPHAGEAL ECHOCARDIOGRAM (TEE);  Surgeon: Larey Dresser, MD;  Location: [place];  Service: Cardiovascular;  Laterality: N/A;   TEE WITHOUT CARDIOVERSION N/A 05/02/2015   Procedure: TRANSESOPHAGEAL ECHOCARDIOGRAM (TEE);  Surgeon: Lelon Perla, MD;  Location: [facility] ENDOSCOPY;  Service: Cardiovascular;  Laterality: N/A;   TEE WITHOUT CARDIOVERSION N/A 03/24/2022   Procedure: TRANSESOPHAGEAL ECHOCARDIOGRAM (TEE);  Surgeon: Dahlia Byes, MD;  Location: [place];  Service: Open Heart Surgery;  Laterality: N/A;    Current Outpatient Medications  Medication Sig Dispense Refill   acetaminophen (TYLENOL) 500 MG tablet Take 500 mg by mouth every 6 (six) hours as needed for mild pain or moderate pain.     ALPRAZolam (XANAX) 0.5 MG tablet TAKE 1 TABLET BY MOUTH THREE TIMES DAILYAS NEEDED FOR ANXIETY 20 tablet 0   AMBULATORY NON FORMULARY MEDICATION Portable oxygen concentrator, 3 L [place].  Please fax to [facility] (Patient taking differently: Inhale 3 L into the lungs at bedtime. Portable oxygen concentrator 3 L (Do not use)  Please fax to [facility]) 1 each 0   amiodarone (PACERONE) 200 MG tablet TAKE 1 TABLET(200 MG) BY MOUTH EVERY MORNING 90 tablet 1   apixaban (ELIQUIS) 5 MG TABS tablet Take 1 tablet (5 mg total) by mouth 2 (two) times daily. 180 tablet 3   Artificial Tear Ointment (DRY EYES OP) Place 1 drop into both nostrils daily as needed (dry  eyes).     aspirin EC 81 MG EC tablet Take 1 tablet (81 mg total) by mouth daily. Swallow whole. 30 tablet 11   atorvastatin (LIPITOR) 80 MG tablet Take 1 tablet (80 mg total) by mouth daily. 90 tablet 3   calcium carbonate (TUMS - DOSED IN MG ELEMENTAL CALCIUM) 500 MG chewable tablet Chew 3 tablets by mouth 2 (two) times daily as needed for indigestion or heartburn.     carvedilol (COREG) 12.5 MG tablet Take 1 tablet (12.5 mg total) by mouth 2 (two) times daily. Patient does not need a refill at this time.     diphenhydramine-acetaminophen (TYLENOL PM) 25-500 MG TABS tablet Take 1 tablet by mouth at bedtime.     furosemide (LASIX) 40 MG tablet Take 40 mg daily as directed. Take an additional 40 mg for weight gain of 3 lb over night or 5 lb weight gain in 1 week. (Patient taking differently: Take 40-80 mg by mouth See admin instructions. Take 40 mg alternating every other day with 80 mg  Take an  additional 40 mg for weight gain of 3 lb over night or 5 lb weight gain in 1 week.) 180 tablet 3   hydrocortisone 2.5 % cream Apply 1 Application topically daily as needed (rash).     loratadine (CLARITIN) 10 MG tablet Take 10 mg by mouth daily.     Magnesium 400 MG TABS Take 400 mg by mouth 2 (two) times a day. 60 tablet 11   montelukast (SINGULAIR) 10 MG tablet Take 10 mg by mouth at bedtime.     oxymetazoline (AFRIN) 0.05 % nasal spray Place 1 spray into left nostril daily as needed for congestion.     pantoprazole (PROTONIX) 40 MG tablet Take 1 tablet (40 mg total) by mouth daily. 90 tablet 1   potassium chloride (KLOR-CON) 10 MEQ tablet Take 1 tablet (10 mEq total) by mouth daily. 90 tablet 3   sildenafil (REVATIO) 20 MG tablet Take 2-3 tablets (40-60 mg total) by mouth as needed. 30 tablet 1   TRELEGY ELLIPTA 100-62.5-25 MCG/ACT AEPB Inhale 1 puff into the lungs daily.     No current facility-administered medications for this encounter.    No Known Allergies  Social History   Socioeconomic  History   Marital status: Widowed    Spouse name: Not on file   Number of children: Not on file   Years of education: Not on file   Highest education level: Not on file  Occupational History   Occupation: security guard  Tobacco Use   Smoking status: Former    Types: Cigarettes    Quit date: 01/02/2013    Years since quitting: 10.2   Smokeless tobacco: Never   Tobacco comments:    smokes about a week  Substance and Sexual Activity   Alcohol use: Yes    Alcohol/week: 0.0 standard drinks of alcohol   Drug use: No   Sexual activity: Not Currently  Other Topics Concern   Not on file  Social History Narrative   Mr Sorby 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 He reports he is independent with all his care needs and transportation to medical appointments   He is stating he is to either retire or go out on disability soon.    His daughter, Gerlean Ren is a SW per pt   Social Determinants of Health   Financial Resource Strain: Not on file  Food Insecurity: No Food Insecurity (07/18/2019)   Hunger Vital Sign    Worried About Running Out of Food in the Last Year: Never true    [place] in the Last Year: Never true  Transportation Needs: No Transportation Needs (07/18/2019)   PRAPARE - Hydrologist (Medical): No    Lack of Transportation (Non-Medical): No  Physical Activity: Not on file  Stress: Not on file  Social Connections: Unknown (07/18/2019)   Social Connection and Isolation Panel [NHANES]    Frequency of Communication with Friends and Family: Not on file    Frequency of Social Gatherings with Friends and Family: Not on file    Attends Religious Services: Not on file    Active Member of Clubs or Organizations: Not on file    Attends Archivist Meetings: Not on file    Marital Status: Widowed  Intimate Partner Violence: Not on file    Family History  Problem Relation Age of Onset   Uterine cancer  Sister    Heart attack  Mother    Breast cancer Mother    Sleep apnea Brother    Heart attack Maternal Grandfather    Sleep apnea Brother     ROS- All systems are reviewed and negative except as per the HPI above  Physical Exam: Vitals:   03/18/23 1047  BP: 130/68  Pulse: (!) 56  Weight: (!) 150.1 kg  Height: 6\' 1"  (1.854 m)   Wt Readings from Last 3 Encounters:  03/18/23 (!) 150.1 kg  02/10/23 (!) 145.8 kg  01/28/23 (!) 142.9 kg    Labs: Lab Results  Component Value Date   NA 144 01/25/2023   K 4.4 01/25/2023   CL 100 01/25/2023   CO2 28 01/25/2023   GLUCOSE 135 (H) 01/25/2023   BUN 23 01/25/2023   CREATININE 1.00 01/25/2023   CALCIUM 9.9 01/25/2023   MG 2.2 04/20/2022   Lab Results  Component Value Date   INR 1.3 (H) 03/24/2022   Lab Results  Component Value Date   CHOL 144 07/28/2022   HDL 42 07/28/2022   LDLCALC 85 07/28/2022   TRIG 88 07/28/2022     GEN- The patient is a well appearing male, alert and oriented x 3 today.   HEENT-head normocephalic, atraumatic, sclera clear, conjunctiva pink, hearing intact, trachea midline. Lungs- Clear to ausculation bilaterally, normal work of breathing Heart- Regular rate and rhythm, no murmurs, rubs or gallops  GI- soft, NT, ND, + BS Extremities- no clubbing, cyanosis, or edema MS- no significant deformity or atrophy Skin- no rash or lesion Psych- euthymic mood, full affect Neuro- strength and sensation are intact   EKG today demonstrates SB, 1st degree AV block, PACs Vent. rate 56 BPM PR interval 256 ms QRS duration 104 ms QT/QTcB 460/443 ms   CHA2DS2-VASc Score = 4  The patient's score is based upon: CHF History: 1 HTN History: 1 Diabetes History: 0 (pre diabetes) Stroke History: 0 Vascular Disease History: 1 Age Score: 1 Gender Score: 0       ASSESSMENT AND PLAN: 1. Persistent Atrial Fibrillation/atrial flutter The patient's CHA2DS2-VASc score is 4, indicating a 4.8% annual risk of stroke.    Previously on dofetilide S/p afib and flutter ablation 01/28/23 Patient appears to be maintaining SR. Reassured patient that it is not uncommon to have some breakthrough afib in the first 3 months post ablation. His Jodelle Red may also be falsely reading his rhythm as afib due to his PACs.  Continue Eliquis 5 mg BID with no missed doses for 3 months post ablation.  Continue amiodarone 200 mg daily for now.  Continue carvedilol 12.5 mg BID  2. Secondary Hypercoagulable State (ICD10:  D68.69) The patient is at significant risk for stroke/thromboembolism based upon his CHA2DS2-VASc Score of 4.  Continue Apixaban (Eliquis).   3. CAD S/p CABG 03/2022 No anginal symptoms.   4. Chronic combined systolic and diastolic CHF EF normalized Fluid status appears stable today.   5. HTN Stable, no changes today.   Follow up with Dr Curt Bears as scheduled.    [facility] [address] [place], [place] [address] [phone]

## 2023-03-29 ENCOUNTER — Other Ambulatory Visit: Payer: Self-pay | Admitting: Internal Medicine

## 2023-03-29 ENCOUNTER — Other Ambulatory Visit: Payer: Self-pay | Admitting: Nurse Practitioner

## 2023-04-09 ENCOUNTER — Institutional Professional Consult (permissible substitution): Payer: Medicare (Managed Care) | Admitting: Pulmonary Disease

## 2023-04-18 ENCOUNTER — Emergency Department (HOSPITAL_COMMUNITY): Payer: Medicare (Managed Care)

## 2023-04-18 ENCOUNTER — Other Ambulatory Visit: Payer: Self-pay

## 2023-04-18 ENCOUNTER — Inpatient Hospital Stay (HOSPITAL_COMMUNITY)
Admission: EM | Admit: 2023-04-18 | Discharge: 2023-04-23 | DRG: 291 | Disposition: A | Discharge status: Home / Self Care | Payer: Medicare (Managed Care) | Attending: Family Medicine | Admitting: Family Medicine

## 2023-04-18 DIAGNOSIS — I428 Other cardiomyopathies: Secondary | ICD-10-CM | POA: Diagnosis present

## 2023-04-18 DIAGNOSIS — F419 Anxiety disorder, unspecified: Secondary | ICD-10-CM | POA: Diagnosis present

## 2023-04-18 DIAGNOSIS — I700 Atherosclerosis of aorta: Secondary | ICD-10-CM | POA: Diagnosis present

## 2023-04-18 DIAGNOSIS — R042 Hemoptysis: Secondary | ICD-10-CM

## 2023-04-18 DIAGNOSIS — I110 Hypertensive heart disease with heart failure: Secondary | ICD-10-CM | POA: Diagnosis not present

## 2023-04-18 DIAGNOSIS — Z87891 Personal history of nicotine dependence: Secondary | ICD-10-CM

## 2023-04-18 DIAGNOSIS — K219 Gastro-esophageal reflux disease without esophagitis: Secondary | ICD-10-CM | POA: Diagnosis present

## 2023-04-18 DIAGNOSIS — R5381 Other malaise: Secondary | ICD-10-CM | POA: Diagnosis present

## 2023-04-18 DIAGNOSIS — J9621 Acute and chronic respiratory failure with hypoxia: Secondary | ICD-10-CM | POA: Diagnosis present

## 2023-04-18 DIAGNOSIS — Z951 Presence of aortocoronary bypass graft: Secondary | ICD-10-CM

## 2023-04-18 DIAGNOSIS — Z713 Dietary counseling and surveillance: Secondary | ICD-10-CM

## 2023-04-18 DIAGNOSIS — Z8249 Family history of ischemic heart disease and other diseases of the circulatory system: Secondary | ICD-10-CM

## 2023-04-18 DIAGNOSIS — J439 Emphysema, unspecified: Secondary | ICD-10-CM | POA: Diagnosis present

## 2023-04-18 DIAGNOSIS — J189 Pneumonia, unspecified organism: Secondary | ICD-10-CM | POA: Diagnosis not present

## 2023-04-18 DIAGNOSIS — R001 Bradycardia, unspecified: Secondary | ICD-10-CM | POA: Diagnosis present

## 2023-04-18 DIAGNOSIS — I4819 Other persistent atrial fibrillation: Secondary | ICD-10-CM | POA: Diagnosis present

## 2023-04-18 DIAGNOSIS — Z7982 Long term (current) use of aspirin: Secondary | ICD-10-CM

## 2023-04-18 DIAGNOSIS — J440 Chronic obstructive pulmonary disease with acute lower respiratory infection: Secondary | ICD-10-CM | POA: Diagnosis present

## 2023-04-18 DIAGNOSIS — I480 Paroxysmal atrial fibrillation: Secondary | ICD-10-CM | POA: Diagnosis present

## 2023-04-18 DIAGNOSIS — Z7901 Long term (current) use of anticoagulants: Secondary | ICD-10-CM

## 2023-04-18 DIAGNOSIS — I081 Rheumatic disorders of both mitral and tricuspid valves: Secondary | ICD-10-CM | POA: Diagnosis present

## 2023-04-18 DIAGNOSIS — Z6841 Body Mass Index (BMI) 40.0 and over, adult: Secondary | ICD-10-CM

## 2023-04-18 DIAGNOSIS — I10 Essential (primary) hypertension: Secondary | ICD-10-CM | POA: Diagnosis present

## 2023-04-18 DIAGNOSIS — J9601 Acute respiratory failure with hypoxia: Secondary | ICD-10-CM

## 2023-04-18 DIAGNOSIS — I5023 Acute on chronic systolic (congestive) heart failure: Secondary | ICD-10-CM | POA: Diagnosis present

## 2023-04-18 DIAGNOSIS — E119 Type 2 diabetes mellitus without complications: Secondary | ICD-10-CM

## 2023-04-18 DIAGNOSIS — I2510 Atherosclerotic heart disease of native coronary artery without angina pectoris: Secondary | ICD-10-CM | POA: Diagnosis present

## 2023-04-18 DIAGNOSIS — G4733 Obstructive sleep apnea (adult) (pediatric): Secondary | ICD-10-CM | POA: Diagnosis present

## 2023-04-18 DIAGNOSIS — I5033 Acute on chronic diastolic (congestive) heart failure: Secondary | ICD-10-CM | POA: Diagnosis present

## 2023-04-18 DIAGNOSIS — Z79899 Other long term (current) drug therapy: Secondary | ICD-10-CM

## 2023-04-18 DIAGNOSIS — E785 Hyperlipidemia, unspecified: Secondary | ICD-10-CM | POA: Diagnosis present

## 2023-04-18 LAB — CBC WITH DIFFERENTIAL/PLATELET
Abs Immature Granulocytes: 0.02 10*3/uL (ref 0.00–0.07)
Basophils Absolute: 0.1 10*3/uL (ref 0.0–0.1)
Basophils Relative: 1 %
Eosinophils Absolute: 0.2 10*3/uL (ref 0.0–0.5)
Eosinophils Relative: 2 %
HCT: 41.9 % (ref 39.0–52.0)
Hemoglobin: 12.6 g/dL — ABNORMAL LOW (ref 13.0–17.0)
Immature Granulocytes: 0 %
Lymphocytes Relative: 25 %
Lymphs Abs: 2.3 10*3/uL (ref 0.7–4.0)
MCH: 26.4 pg (ref 26.0–34.0)
MCHC: 30.1 g/dL (ref 30.0–36.0)
MCV: 87.7 fL (ref 80.0–100.0)
Monocytes Absolute: 0.9 10*3/uL (ref 0.1–1.0)
Monocytes Relative: 11 %
Neutro Abs: 5.5 10*3/uL (ref 1.7–7.7)
Neutrophils Relative %: 61 %
Platelets: 224 10*3/uL (ref 150–400)
RBC: 4.78 MIL/uL (ref 4.22–5.81)
RDW: 16.4 % — ABNORMAL HIGH (ref 11.5–15.5)
WBC: 8.9 10*3/uL (ref 4.0–10.5)
nRBC: 0 % (ref 0.0–0.2)

## 2023-04-18 LAB — BASIC METABOLIC PANEL
Anion gap: 11 (ref 5–15)
BUN: 22 mg/dL (ref 8–23)
CO2: 27 mmol/L (ref 22–32)
Calcium: 8.7 mg/dL — ABNORMAL LOW (ref 8.9–10.3)
Chloride: 99 mmol/L (ref 98–111)
Creatinine, Ser: 0.83 mg/dL (ref 0.61–1.24)
GFR, Estimated: >60 mL/min (ref >60)
Glucose, Bld: 149 mg/dL — ABNORMAL HIGH (ref 70–99)
Potassium: 4.4 mmol/L (ref 3.5–5.1)
Sodium: 137 mmol/L (ref 135–145)

## 2023-04-18 LAB — BRAIN NATRIURETIC PEPTIDE: B Natriuretic Peptide: 355.4 pg/mL — ABNORMAL HIGH (ref 0.0–100.0)

## 2023-04-18 LAB — TROPONIN I (HIGH SENSITIVITY): Troponin I (High Sensitivity): 13 ng/L (ref <18)

## 2023-04-18 MED ORDER — SODIUM CHLORIDE 0.9 % IV SOLN
500.0000 mg | Freq: Once | INTRAVENOUS | Status: AC
  Administered 2023-04-19: 500 mg via INTRAVENOUS
  Filled 2023-04-18: qty 5

## 2023-04-18 MED ORDER — SODIUM CHLORIDE 0.9 % IV SOLN
2.0000 g | Freq: Once | INTRAVENOUS | Status: AC
  Administered 2023-04-19: 2 g via INTRAVENOUS
  Filled 2023-04-18: qty 20

## 2023-04-18 MED ORDER — ALBUTEROL SULFATE HFA 108 (90 BASE) MCG/ACT IN AERS: 2.0000 | INHALATION_SPRAY | RESPIRATORY_TRACT | Status: DC | PRN

## 2023-04-18 NOTE — ED Provider Notes (Signed)
[place] EMERGENCY DEPARTMENT AT [facility] Provider Note   CSN: [id number] Arrival date & time: 04/18/23  1928     History  Chief Complaint  Patient presents with   Shortness of Breath    Arthur Church is a 67 y.o. male.  With history of hypertension, prediabetes, A-fib, coronary artery disease, CHF with an EF of 45 to 50% on 03/24/2022 who presents to the ED for evaluation of 20 pound weight gain, bilateral lower extremity edema, shortness of breath, hypoxia.  Symptoms began on Tuesday of last week and have progressively gotten worse.  He states he has been coughing up blood for the past 3 days.  He checks his oxygen level at home quite frequently and reports it to be as low as high 70s while ambulating.  He has been taking his 80 mg Lasix as prescribed and has been doubling up on his dose over the past 2 days with minimal improvement.  He denies history of DVT or PE, chest pain, fevers, chills, abdominal pain, nausea, vomiting.   Shortness of Breath Associated symptoms: cough        Home Medications Prior to Admission medications   Medication Sig Start Date End Date Taking? Authorizing Provider  acetaminophen (TYLENOL) 500 MG tablet Take 500 mg by mouth every 6 (six) hours as needed for mild pain or moderate pain.    [provider]  ALPRAZolam Prudy Feeler) 0.5 MG tablet TAKE 1 TABLET BY MOUTH THREE TIMES DAILYAS NEEDED FOR ANXIETY 08/04/19   Monica Becton, MD  AMBULATORY NON FORMULARY MEDICATION Portable oxygen concentrator, 3 L [place].  Please fax to [facility] Patient taking differently: Inhale 3 L into the lungs at bedtime. Portable oxygen concentrator 3 L (Do not use)  Please fax to [facility] 03/17/19   Monica Becton, MD  amiodarone (PACERONE) 200 MG tablet TAKE 1 TABLET(200 MG) BY MOUTH EVERY MORNING 12/28/22   Newman Nip, NP  apixaban (ELIQUIS) 5 MG TABS tablet Take 1 tablet (5 mg total) by mouth 2 (two) times daily. 02/27/19   Hilty,  Lisette Abu, MD  Artificial Tear Ointment (DRY EYES OP) Place 1 drop into both nostrils daily as needed (dry eyes).    [provider]  aspirin EC 81 MG EC tablet Take 1 tablet (81 mg total) by mouth daily. Swallow whole. 04/02/22   Barrett, Erin R, PA-C  atorvastatin (LIPITOR) 80 MG tablet Take 1 tablet (80 mg total) by mouth daily. 05/24/19   Jodelle Gross, NP  calcium carbonate (TUMS - DOSED IN MG ELEMENTAL CALCIUM) 500 MG chewable tablet Chew 3 tablets by mouth 2 (two) times daily as needed for indigestion or heartburn.    [provider]  carvedilol (COREG) 12.5 MG tablet TAKE 1 TABLET(12.5 MG) BY MOUTH TWICE DAILY 03/30/23   Fenton, Clint R, PA  diphenhydramine-acetaminophen (TYLENOL PM) 25-500 MG TABS tablet Take 1 tablet by mouth at bedtime.    [provider]  furosemide (LASIX) 40 MG tablet TAKE 1 TABLET BY MOUTH EVERY DAY. MAY TAKE ADDITIONAL TABLET FOR WEIGHT GAIN OF 3LBS OVER NIGHT OR 5 LBS GAIN IN 1 WEEK 03/31/23   Joylene Grapes, NP  hydrocortisone 2.5 % cream Apply 1 Application topically daily as needed (rash).    [provider]  loratadine (CLARITIN) 10 MG tablet Take 10 mg by mouth daily.    [provider]  Magnesium 400 MG TABS Take 400 mg by mouth 2 (two) times a  day. 07/14/19   Chrystie Nose, MD  montelukast (SINGULAIR) 10 MG tablet Take 10 mg by mouth at bedtime. 01/14/22   [provider]  oxymetazoline (AFRIN) 0.05 % nasal spray Place 1 spray into left nostril daily as needed for congestion.    [provider]  pantoprazole (PROTONIX) 40 MG tablet Take 1 tablet (40 mg total) by mouth daily. 11/30/19   Monica Becton, MD  potassium chloride (KLOR-CON) 10 MEQ tablet TAKE 1 TABLET(10 MEQ) BY MOUTH DAILY 03/31/23   Bernadene Person C, NP  sildenafil (REVATIO) 20 MG tablet Take 2-3 tablets (40-60 mg total) by mouth as needed. 02/10/23   Hilty, Lisette Abu, MD  TRELEGY ELLIPTA 100-62.5-25 MCG/ACT AEPB Inhale 1 puff  into the lungs daily. 02/18/22   [provider]      Allergies    Patient has no known allergies.    Review of Systems   Review of Systems  Respiratory:  Positive for cough and shortness of breath.   All other systems reviewed and are negative.   Physical Exam Updated Vital Signs BP (!) 155/72   Pulse (!) 56   Temp 98.2 F (36.8 C)   Resp (!) 21   Ht 6\' 1"  (1.854 m)   Wt (!) 156.5 kg   SpO2 93%   BMI 45.52 kg/m  Physical Exam Vitals and nursing note reviewed.  Constitutional:      General: He is not in acute distress.    Appearance: He is well-developed. He is obese. He is not ill-appearing, toxic-appearing or diaphoretic.  HENT:     Head: Normocephalic and atraumatic.  Eyes:     Conjunctiva/sclera: Conjunctivae normal.  Cardiovascular:     Rate and Rhythm: Normal rate and regular rhythm.     Heart sounds: No murmur heard. Pulmonary:     Effort: Pulmonary effort is normal. No respiratory distress.     Breath sounds: Examination of the right-middle field reveals rhonchi. Examination of the right-lower field reveals rhonchi. Decreased breath sounds and rhonchi present.  Abdominal:     Palpations: Abdomen is soft.     Tenderness: There is no abdominal tenderness.  Musculoskeletal:        General: No swelling.     Cervical back: Neck supple.     Comments: Bilateral 2+ pitting edema to the level of the knees  Skin:    General: Skin is warm and dry.     Capillary Refill: Capillary refill takes less than 2 seconds.  Neurological:     Mental Status: He is alert.  Psychiatric:        Mood and Affect: Mood normal.     ED Results / Procedures / Treatments   Labs (all labs ordered are listed, but only abnormal results are displayed) Labs Reviewed  BRAIN NATRIURETIC PEPTIDE - Abnormal; Notable for the following components:      Result Value   B Natriuretic Peptide 355.4 (*)    All other components within normal limits  CBC WITH DIFFERENTIAL/PLATELET -  Abnormal; Notable for the following components:   Hemoglobin 12.6 (*)    RDW 16.4 (*)    All other components within normal limits  BASIC METABOLIC PANEL - Abnormal; Notable for the following components:   Glucose, Bld 149 (*)    Calcium 8.7 (*)    All other components within normal limits  TROPONIN I (HIGH SENSITIVITY)  TROPONIN I (HIGH SENSITIVITY)    EKG None  Radiology DG Chest 2 View  Result Date: 04/18/2023 CLINICAL DATA:  SOB EXAM: CHEST - 2 VIEW COMPARISON:  May 18, 2022 FINDINGS: The cardiomediastinal silhouette is unchanged in contour.Status post median sternotomy and CABG. No pleural effusion. No pneumothorax. New RIGHT lateral basilar airspace opacity. LEFT basilar platelike opacities, likely atelectasis. Visualized abdomen is unremarkable. Multilevel degenerative changes of the thoracic spine. IMPRESSION: New RIGHT lateral basilar airspace opacity. Differential considerations include infection, aspiration or other inflammatory process. Follow-up PA and lateral chest radiograph 4-6 weeks after appropriate treatment would be recommended to exclude underlying neoplasm Electronically Signed   By: Meda Klinefelter M.D.   On: 04/18/2023 20:18    Procedures Procedures    Medications Ordered in ED Medications  albuterol (VENTOLIN HFA) 108 (90 Base) MCG/ACT inhaler 2 puff (has no administration in time range)  cefTRIAXone (ROCEPHIN) 2 g in sodium chloride 0.9 % 100 mL IVPB (has no administration in time range)  azithromycin (ZITHROMAX) 500 mg in sodium chloride 0.9 % 250 mL IVPB (has no administration in time range)    ED Course/ Medical Decision Making/ A&P                             Medical Decision Making Amount and/or Complexity of Data Reviewed Labs: ordered. Radiology: ordered.  Risk Prescription drug management.  This patient presents to the ED for concern of cough, shortness of breath, this involves an extensive number of treatment options, and is a complaint  that carries with it a high risk of complications and morbidity. The emergent differential diagnosis for shortness of breath includes, but is not limited to, Pulmonary edema, bronchoconstriction, Pneumonia, Pulmonary embolism, Pneumotherax/ Hemothorax, Dysrythmia, ACS.    Co morbidities that complicate the patient evaluation  hypertension, prediabetes, A-fib, coronary artery disease, CHF with an EF of 45 to 50% on 03/24/2022  My initial workup includes labs, imaging, EKG  Additional history obtained from: Nursing notes from this visit.  I ordered, reviewed and interpreted labs which include: BMP, CBC, BNP, troponin.  BNP elevated to 355 which is improved from previous  I ordered imaging studies including chest x-ray, CT PE study on arrival, patient was agitated I independently visualized and interpreted imaging which showed this is from polysubstance abuse.  Right-sided pneumonia on chest x-ray, PE study pending at the time of shift change I agree with the radiologist interpretation  Cardiac Monitoring:  The patient was maintained on a cardiac monitor.  I personally viewed and interpreted the cardiac monitored which showed an underlying rhythm of: NSR  Afebrile, hypoxic to 87% on room air.  Otherwise hemodynamically stable.  67 year old male presenting to the ED for evaluation of worsening lower extremity swelling, shortness of breath, hemoptysis.  He has adventitious breath sounds to the right lung and 2+ pitting edema to the level of the knees.  He has been taking double his dose of home Lasix without improvement in his symptoms.  His lab workup is overall reassuring, however chest x-ray shows possible right-sided pneumonia.  Given his new hypoxia and hemoptysis I have clinical suspicion for PE, however symptoms are likely due to community acquired pneumonia. Plan at the time of shift change is to await CT PE study results. If negative for PE, will need IV antibiotics and admission for CAP with  hypoxia. If positive, will need appropriate treatment and admission. Care will be handed off to [facility], PA-C pending workup. Plan may change at the discretion of the oncoming provider. Please see her  note for final disposition and decision making.  Patient's case discussed with Dr. Criss Alvine who agrees with plan to admit.   Note: Portions of this report may have been transcribed using voice recognition software. Every effort was made to ensure accuracy; however, inadvertent computerized transcription errors may still be present.        Final Clinical Impression(s) / ED Diagnoses Final diagnoses:  Community acquired pneumonia of right lung, unspecified part of lung    Rx / DC Orders ED Discharge Orders     None         Mora Bellman 04/18/23 2356    Pricilla Loveless, MD 04/21/23 [phone]

## 2023-04-18 NOTE — ED Triage Notes (Addendum)
Pt arrived via [facility] for shortness of breath from home. Pt notes increased shortness of breath, weight gain (20lb), and bilateral lower extremity swelling over the last three days. Pt wears oxygen at night, but has been having to wear it during the day. Pt increased lasix dosage without relief of symptoms. Diminished throughout. 20g LH. 125 Solumed, 5mg  Albuterol.   PTA EMS Vitals BP 141/110 HR 66 SPO299% on ned RR 18 CBG 120

## 2023-04-19 ENCOUNTER — Inpatient Hospital Stay (HOSPITAL_COMMUNITY): Payer: Medicare (Managed Care)

## 2023-04-19 ENCOUNTER — Emergency Department (HOSPITAL_COMMUNITY): Payer: Medicare (Managed Care)

## 2023-04-19 DIAGNOSIS — I2510 Atherosclerotic heart disease of native coronary artery without angina pectoris: Secondary | ICD-10-CM | POA: Diagnosis present

## 2023-04-19 DIAGNOSIS — J439 Emphysema, unspecified: Secondary | ICD-10-CM | POA: Diagnosis present

## 2023-04-19 DIAGNOSIS — I5043 Acute on chronic combined systolic (congestive) and diastolic (congestive) heart failure: Secondary | ICD-10-CM | POA: Diagnosis not present

## 2023-04-19 DIAGNOSIS — J440 Chronic obstructive pulmonary disease with acute lower respiratory infection: Secondary | ICD-10-CM | POA: Diagnosis present

## 2023-04-19 DIAGNOSIS — E785 Hyperlipidemia, unspecified: Secondary | ICD-10-CM | POA: Diagnosis present

## 2023-04-19 DIAGNOSIS — Z6841 Body Mass Index (BMI) 40.0 and over, adult: Secondary | ICD-10-CM | POA: Diagnosis not present

## 2023-04-19 DIAGNOSIS — F419 Anxiety disorder, unspecified: Secondary | ICD-10-CM | POA: Diagnosis present

## 2023-04-19 DIAGNOSIS — Z7901 Long term (current) use of anticoagulants: Secondary | ICD-10-CM | POA: Diagnosis not present

## 2023-04-19 DIAGNOSIS — I428 Other cardiomyopathies: Secondary | ICD-10-CM | POA: Diagnosis present

## 2023-04-19 DIAGNOSIS — Z79899 Other long term (current) drug therapy: Secondary | ICD-10-CM | POA: Diagnosis not present

## 2023-04-19 DIAGNOSIS — I509 Heart failure, unspecified: Secondary | ICD-10-CM | POA: Diagnosis not present

## 2023-04-19 DIAGNOSIS — I480 Paroxysmal atrial fibrillation: Secondary | ICD-10-CM | POA: Diagnosis not present

## 2023-04-19 DIAGNOSIS — I5031 Acute diastolic (congestive) heart failure: Secondary | ICD-10-CM

## 2023-04-19 DIAGNOSIS — J9601 Acute respiratory failure with hypoxia: Secondary | ICD-10-CM

## 2023-04-19 DIAGNOSIS — J189 Pneumonia, unspecified organism: Secondary | ICD-10-CM | POA: Diagnosis present

## 2023-04-19 DIAGNOSIS — G4733 Obstructive sleep apnea (adult) (pediatric): Secondary | ICD-10-CM | POA: Diagnosis present

## 2023-04-19 DIAGNOSIS — K219 Gastro-esophageal reflux disease without esophagitis: Secondary | ICD-10-CM | POA: Diagnosis present

## 2023-04-19 DIAGNOSIS — I5023 Acute on chronic systolic (congestive) heart failure: Secondary | ICD-10-CM | POA: Diagnosis not present

## 2023-04-19 DIAGNOSIS — R042 Hemoptysis: Secondary | ICD-10-CM | POA: Diagnosis present

## 2023-04-19 DIAGNOSIS — I5033 Acute on chronic diastolic (congestive) heart failure: Secondary | ICD-10-CM | POA: Diagnosis present

## 2023-04-19 DIAGNOSIS — I4819 Other persistent atrial fibrillation: Secondary | ICD-10-CM | POA: Diagnosis present

## 2023-04-19 DIAGNOSIS — Z87891 Personal history of nicotine dependence: Secondary | ICD-10-CM | POA: Diagnosis not present

## 2023-04-19 DIAGNOSIS — R001 Bradycardia, unspecified: Secondary | ICD-10-CM | POA: Diagnosis present

## 2023-04-19 DIAGNOSIS — I081 Rheumatic disorders of both mitral and tricuspid valves: Secondary | ICD-10-CM | POA: Diagnosis present

## 2023-04-19 DIAGNOSIS — I5022 Chronic systolic (congestive) heart failure: Secondary | ICD-10-CM | POA: Diagnosis not present

## 2023-04-19 DIAGNOSIS — R5381 Other malaise: Secondary | ICD-10-CM | POA: Diagnosis present

## 2023-04-19 DIAGNOSIS — I700 Atherosclerosis of aorta: Secondary | ICD-10-CM | POA: Diagnosis present

## 2023-04-19 DIAGNOSIS — J9621 Acute and chronic respiratory failure with hypoxia: Secondary | ICD-10-CM | POA: Diagnosis present

## 2023-04-19 DIAGNOSIS — E119 Type 2 diabetes mellitus without complications: Secondary | ICD-10-CM | POA: Diagnosis present

## 2023-04-19 DIAGNOSIS — I110 Hypertensive heart disease with heart failure: Secondary | ICD-10-CM | POA: Diagnosis present

## 2023-04-19 LAB — ECHOCARDIOGRAM COMPLETE
AR max vel: 2.35 cm2
AV Area VTI: 2.26 cm2
AV Area mean vel: 2.26 cm2
AV Mean grad: 3 mmHg
AV Peak grad: 6.2 mmHg
Ao pk vel: 1.24 m/s
Area-P 1/2: 4.21 cm2
Est EF: 50
Height: 73 in
MV M vel: 3.02 m/s
MV Peak grad: 36.5 mmHg
S' Lateral: 4.4 cm
Single Plane A4C EF: 50.6 %
Weight: 5520 oz

## 2023-04-19 LAB — CBC
HCT: 45.1 % (ref 39.0–52.0)
Hemoglobin: 13.6 g/dL (ref 13.0–17.0)
MCH: 26.1 pg (ref 26.0–34.0)
MCHC: 30.2 g/dL (ref 30.0–36.0)
MCV: 86.6 fL (ref 80.0–100.0)
Platelets: 237 10*3/uL (ref 150–400)
RBC: 5.21 MIL/uL (ref 4.22–5.81)
RDW: 16.2 % — ABNORMAL HIGH (ref 11.5–15.5)
WBC: 8.9 10*3/uL (ref 4.0–10.5)
nRBC: 0 % (ref 0.0–0.2)

## 2023-04-19 LAB — TROPONIN I (HIGH SENSITIVITY): Troponin I (High Sensitivity): 17 ng/L (ref <18)

## 2023-04-19 LAB — MAGNESIUM: Magnesium: 1.9 mg/dL (ref 1.7–2.4)

## 2023-04-19 LAB — BASIC METABOLIC PANEL
Anion gap: 9 (ref 5–15)
BUN: 19 mg/dL (ref 8–23)
CO2: 28 mmol/L (ref 22–32)
Calcium: 8.9 mg/dL (ref 8.9–10.3)
Chloride: 100 mmol/L (ref 98–111)
Creatinine, Ser: 0.71 mg/dL (ref 0.61–1.24)
GFR, Estimated: >60 mL/min (ref >60)
Glucose, Bld: 222 mg/dL — ABNORMAL HIGH (ref 70–99)
Potassium: 4.2 mmol/L (ref 3.5–5.1)
Sodium: 137 mmol/L (ref 135–145)

## 2023-04-19 LAB — HEMOGLOBIN A1C
Hgb A1c MFr Bld: 6.9 % — ABNORMAL HIGH (ref 4.8–5.6)
Mean Plasma Glucose: 151.33 mg/dL

## 2023-04-19 LAB — PHOSPHORUS: Phosphorus: 4.2 mg/dL (ref 2.5–4.6)

## 2023-04-19 LAB — GLUCOSE, CAPILLARY
Glucose-Capillary: 206 mg/dL — ABNORMAL HIGH (ref 70–99)
Glucose-Capillary: 160 mg/dL — ABNORMAL HIGH (ref 70–99)
Glucose-Capillary: 106 mg/dL — ABNORMAL HIGH (ref 70–99)

## 2023-04-19 LAB — PROCALCITONIN: Procalcitonin: <0.1 ng/mL

## 2023-04-19 LAB — CBG MONITORING, ED
Glucose-Capillary: 169 mg/dL — ABNORMAL HIGH (ref 70–99)
Glucose-Capillary: 230 mg/dL — ABNORMAL HIGH (ref 70–99)

## 2023-04-19 MED ORDER — PERFLUTREN LIPID MICROSPHERE
1.0000 mL | INTRAVENOUS | Status: AC | PRN
  Administered 2023-04-19: 3 mL via INTRAVENOUS

## 2023-04-19 MED ORDER — CARVEDILOL 3.125 MG PO TABS
3.1250 mg | ORAL_TABLET | Freq: Two times a day (BID) | ORAL | Status: DC
  Administered 2023-04-19 – 2023-04-23 (×8): 3.125 mg via ORAL
  Filled 2023-04-19 (×8): qty 1

## 2023-04-19 MED ORDER — POLYETHYLENE GLYCOL 3350 17 G PO PACK
17.0000 g | PACK | Freq: Every day | ORAL | Status: DC | PRN
  Administered 2023-04-20: 17 g via ORAL
  Filled 2023-04-19: qty 1

## 2023-04-19 MED ORDER — IOHEXOL 350 MG/ML SOLN
100.0000 mL | Freq: Once | INTRAVENOUS | Status: AC | PRN
  Administered 2023-04-19: 100 mL via INTRAVENOUS

## 2023-04-19 MED ORDER — PROCHLORPERAZINE EDISYLATE 10 MG/2ML IJ SOLN: 5.0000 mg | Freq: Four times a day (QID) | INTRAMUSCULAR | Status: DC | PRN

## 2023-04-19 MED ORDER — ACETAMINOPHEN 325 MG PO TABS
650.0000 mg | ORAL_TABLET | Freq: Four times a day (QID) | ORAL | Status: DC | PRN
  Administered 2023-04-19 – 2023-04-22 (×7): 650 mg via ORAL
  Filled 2023-04-19 (×7): qty 2

## 2023-04-19 MED ORDER — ATORVASTATIN CALCIUM 80 MG PO TABS
80.0000 mg | ORAL_TABLET | Freq: Every day | ORAL | Status: DC
  Administered 2023-04-19 – 2023-04-23 (×5): 80 mg via ORAL
  Filled 2023-04-19 (×3): qty 1
  Filled 2023-04-19: qty 2
  Filled 2023-04-19: qty 1

## 2023-04-19 MED ORDER — MONTELUKAST SODIUM 10 MG PO TABS
10.0000 mg | ORAL_TABLET | Freq: Every day | ORAL | Status: DC
  Administered 2023-04-19 – 2023-04-22 (×5): 10 mg via ORAL
  Filled 2023-04-19 (×5): qty 1

## 2023-04-19 MED ORDER — UMECLIDINIUM BROMIDE 62.5 MCG/ACT IN AEPB
1.0000 | INHALATION_SPRAY | Freq: Every day | RESPIRATORY_TRACT | Status: DC
  Administered 2023-04-19 – 2023-04-23 (×5): 1 via RESPIRATORY_TRACT
  Filled 2023-04-19: qty 7

## 2023-04-19 MED ORDER — ALBUTEROL SULFATE (2.5 MG/3ML) 0.083% IN NEBU: 2.5000 mg | INHALATION_SOLUTION | RESPIRATORY_TRACT | Status: DC | PRN

## 2023-04-19 MED ORDER — SODIUM CHLORIDE 0.9 % IV SOLN: 2.0000 g | INTRAVENOUS | Status: DC

## 2023-04-19 MED ORDER — ASPIRIN 81 MG PO TBEC
81.0000 mg | DELAYED_RELEASE_TABLET | Freq: Every day | ORAL | Status: DC
  Administered 2023-04-19 – 2023-04-23 (×5): 81 mg via ORAL
  Filled 2023-04-19 (×5): qty 1

## 2023-04-19 MED ORDER — FUROSEMIDE 10 MG/ML IJ SOLN
40.0000 mg | Freq: Two times a day (BID) | INTRAMUSCULAR | Status: DC
  Administered 2023-04-19 – 2023-04-20 (×2): 40 mg via INTRAVENOUS
  Filled 2023-04-19 (×2): qty 4

## 2023-04-19 MED ORDER — AMIODARONE HCL 200 MG PO TABS
100.0000 mg | ORAL_TABLET | Freq: Every day | ORAL | Status: DC
  Administered 2023-04-19 – 2023-04-23 (×5): 100 mg via ORAL
  Filled 2023-04-19 (×5): qty 1

## 2023-04-19 MED ORDER — SODIUM CHLORIDE 0.9 % IV SOLN: 500.0000 mg | INTRAVENOUS | Status: DC

## 2023-04-19 MED ORDER — FUROSEMIDE 10 MG/ML IJ SOLN
20.0000 mg | Freq: Two times a day (BID) | INTRAMUSCULAR | Status: DC
  Administered 2023-04-19: 20 mg via INTRAVENOUS
  Filled 2023-04-19: qty 2

## 2023-04-19 MED ORDER — ALPRAZOLAM 0.25 MG PO TABS
0.2500 mg | ORAL_TABLET | Freq: Every evening | ORAL | Status: DC | PRN
  Administered 2023-04-19 – 2023-04-22 (×4): 0.25 mg via ORAL
  Filled 2023-04-19 (×4): qty 1

## 2023-04-19 MED ORDER — APIXABAN 5 MG PO TABS
5.0000 mg | ORAL_TABLET | Freq: Two times a day (BID) | ORAL | Status: DC
  Administered 2023-04-19 (×3): 5 mg via ORAL
  Filled 2023-04-19 (×3): qty 1

## 2023-04-19 MED ORDER — IPRATROPIUM-ALBUTEROL 0.5-2.5 (3) MG/3ML IN SOLN: 3.0000 mL | RESPIRATORY_TRACT | Status: DC | PRN

## 2023-04-19 MED ORDER — PANTOPRAZOLE SODIUM 40 MG PO TBEC
40.0000 mg | DELAYED_RELEASE_TABLET | Freq: Every day | ORAL | Status: DC
  Administered 2023-04-19 – 2023-04-23 (×5): 40 mg via ORAL
  Filled 2023-04-19 (×5): qty 1

## 2023-04-19 MED ORDER — INSULIN ASPART 100 UNIT/ML IJ SOLN
0.0000 [IU] | Freq: Three times a day (TID) | INTRAMUSCULAR | Status: DC
  Administered 2023-04-19: 3 [IU] via SUBCUTANEOUS
  Administered 2023-04-19: 5 [IU] via SUBCUTANEOUS
  Administered 2023-04-19: 3 [IU] via SUBCUTANEOUS
  Administered 2023-04-20 – 2023-04-23 (×5): 2 [IU] via SUBCUTANEOUS

## 2023-04-19 MED ORDER — FLUTICASONE FUROATE-VILANTEROL 100-25 MCG/ACT IN AEPB
1.0000 | INHALATION_SPRAY | Freq: Every day | RESPIRATORY_TRACT | Status: DC
  Administered 2023-04-19 – 2023-04-23 (×5): 1 via RESPIRATORY_TRACT
  Filled 2023-04-19: qty 28

## 2023-04-19 MED ORDER — INSULIN ASPART 100 UNIT/ML IJ SOLN: 0.0000 [IU] | Freq: Every day | INTRAMUSCULAR | Status: DC

## 2023-04-19 MED ORDER — MELATONIN 5 MG PO TABS
5.0000 mg | ORAL_TABLET | Freq: Every evening | ORAL | Status: DC | PRN
  Administered 2023-04-19 – 2023-04-20 (×2): 5 mg via ORAL
  Filled 2023-04-19 (×2): qty 1

## 2023-04-19 NOTE — Plan of Care (Signed)
  Problem: Education: Goal: Ability to describe self-care measures that may prevent or decrease complications (Diabetes Survival Skills Education) will improve Outcome: Progressing   Problem: Skin Integrity: Goal: Risk for impaired skin integrity will decrease Outcome: Progressing   Problem: Activity: Goal: Risk for activity intolerance will decrease Outcome: Progressing   Problem: Pain Managment: Goal: General experience of comfort will improve Outcome: Progressing   Problem: Safety: Goal: Ability to remain free from injury will improve Outcome: Progressing   Problem: Skin Integrity: Goal: Risk for impaired skin integrity will decrease Outcome: Progressing

## 2023-04-19 NOTE — ED Provider Notes (Signed)
1:31 AM Care assumed at shift change pending CT angiogram.    CTA reviewed and is negative for pulmonary embolus.  It does show multilobar pneumonia.  The patient has already been started on IV antibiotics for the treatment of CAP.  Will necessitate admission given acute respiratory failure with hypoxemia secondary to PNA.  Presently satting well on supplemental oxygen via nasal cannula.  Did desaturate with ambulation on room air to 87%.  Case discussed with Dr. Margo Aye of [facility] who will admit.  Vitals:   04/18/23 1934 04/18/23 1935 04/18/23 1941 04/18/23 2115  BP:   (!) 147/92 (!) 155/72  Pulse:   (!) 56 (!) 56  Resp:   20 (!) 21  Temp:   98.2 F (36.8 C)   SpO2: 95%  96% 93%  Weight:  (!) 156.5 kg    Height:  6\' 1"  (1.854 m)     .Critical Care  Performed by: Antony Madura, PA-C Authorized by: Antony Madura, PA-C   Critical care provider statement:    Critical care time (minutes):  30   Critical care time was exclusive of:  Separately billable procedures and treating other patients   Critical care was necessary to treat or prevent imminent or life-threatening deterioration of the following conditions:  Respiratory failure   Critical care was time spent personally by me on the following activities:  Development of treatment plan with patient or surrogate, discussions with consultants, evaluation of patient's response to treatment, examination of patient, ordering and review of laboratory studies, ordering and review of radiographic studies, ordering and performing treatments and interventions, pulse oximetry, re-evaluation of patient's condition, review of old charts and obtaining history from patient or surrogate   Care discussed with: admitting provider       Antony Madura, PA-C 04/19/23 0134    Mesner, Barbara Cower, MD 04/19/23 0246

## 2023-04-19 NOTE — Progress Notes (Signed)
Patient admitted after midnight, please see H&P by Dr. Katherina Right more CHF vs PNA -procalcitonin is negative -BNP elevated +LE edema -will continue IV lasiz with strict I/Os and daily weights  -d/c abx  Arthur Canary DO

## 2023-04-19 NOTE — ED Notes (Signed)
ED TO INPATIENT HANDOFF REPORT  ED Nurse Name and Phone #: Angelica Chessman [phone]  S Name/Age/Gender Arthur Church 67 y.o. male Room/Bed: 004C/004C  Code Status   Code Status: Full Code  Home/SNF/Other Home Patient oriented to: self, place, time, and situation Is this baseline? Yes   Triage Complete: Triage complete  Chief Complaint CAP (community acquired pneumonia) [J18.9]  Triage Note Pt arrived via [facility] for shortness of breath from home. Pt notes increased shortness of breath, weight gain (20lb), and bilateral lower extremity swelling over the last three days. Pt wears oxygen at night, but has been having to wear it during the day. Pt increased lasix dosage without relief of symptoms. Diminished throughout. 20g LH. 125 Solumed, 5mg  Albuterol.   PTA EMS Vitals BP 141/110 HR 66 SPO299% on ned RR 18 CBG 120   Allergies No Known Allergies  Level of Care/Admitting Diagnosis ED Disposition     ED Disposition  Admit   Condition  --   Comment  Hospital Area: [facility] [[id number]]  Level of Care: Telemetry Medical [104]  May admit patient to [facility] or [facility] if equivalent level of care is available:: Yes  Covid Evaluation: Asymptomatic - no recent exposure (last 10 days) testing not required  Diagnosis: CAP (community acquired pneumonia) [960454]  Admitting Physician: Darlin Drop [[id number]]  Attending Physician: Darlin Drop [[id number]]  Certification:: I certify this patient will need inpatient services for at least 2 midnights  Estimated Length of Stay: 2          B Medical/Surgery History Past Medical History:  Diagnosis Date   Chronic systolic CHF (congestive heart failure) (HCC)    a. 2D ECHO (03/06/15) EF 20-25%, diffuse HK. Asc aortic diameter: 40mm. Mild LA dilation, mild RV dilation. Mild RV systolic dysfunction   b. LifeVest placed on 02/2015 admissoin    Coronary artery disease    a. 70% LAD lesion   Elevated TSH    ETOH abuse     Hypertension    Morbid obesity (HCC)    a. BMI ~46   Persistent atrial fibrillation (HCC)    a. newly dx 02/2015 admission. s/p successful TEE/DCCV  b. on Eliquis   Pre-diabetes    a. HgA1c 6.1   Past Surgical History:  Procedure Laterality Date   ATRIAL FIBRILLATION ABLATION N/A 01/28/2023   Procedure: ATRIAL FIBRILLATION ABLATION;  Surgeon: Regan Lemming, MD;  Location: [facility] INVASIVE CV LAB;  Service: Cardiovascular;  Laterality: N/A;   CARDIAC CATHETERIZATION N/A 04/26/2015   Procedure: Left Heart Cath and Coronary Angiography;  Surgeon: Lyn Records, MD;  Location: [facility] INVASIVE CV LAB;  Service: Cardiovascular;  Laterality: N/A;   CARDIOVERSION N/A 03/06/2015   Procedure: CARDIOVERSION;  Surgeon: Laurey Morale, MD;  Location: [facility] ENDOSCOPY;  Service: Cardiovascular;  Laterality: N/A;   CARDIOVERSION N/A 06/02/2022   Procedure: CARDIOVERSION;  Surgeon: Vesta Mixer, MD;  Location: [facility] ENDOSCOPY;  Service: Cardiovascular;  Laterality: N/A;   CARDIOVERSION N/A 07/14/2022   Procedure: CARDIOVERSION;  Surgeon: Thurmon Fair, MD;  Location: [facility] ENDOSCOPY;  Service: Cardiovascular;  Laterality: N/A;   CORONARY ANGIOGRAPHY N/A 03/18/2022   Procedure: CORONARY ANGIOGRAPHY;  Surgeon: Kathleene Hazel, MD;  Location: [facility] INVASIVE CV LAB;  Service: Cardiovascular;  Laterality: N/A;   CORONARY ARTERY BYPASS GRAFT N/A 03/24/2022   Procedure: CORONARY ARTERY BYPASS GRAFTING (CABG) TIMES TWO USING LEFT INTERNAL MAMMARY ARTERY AND ENDOSCOPICALLY HARVESTED RIGHT GREATER SAPHENOUS VEIN;  Surgeon: Lovett Sox, MD;  Location: [facility] OR;  Service: Open Heart Surgery;  Laterality: N/A;   ENDOVEIN HARVEST OF GREATER SAPHENOUS VEIN Right 03/24/2022   Procedure: ENDOVEIN HARVEST OF GREATER SAPHENOUS VEIN;  Surgeon: Lovett Sox, MD;  Location: [facility] OR;  Service: Open Heart Surgery;  Laterality: Right;   HERNIA REPAIR     LEFT HEART CATH N/A 03/16/2022   Procedure: Left Heart Cath;  Surgeon: [name], MD;  Location: [facility] INVASIVE CV LAB;  Service: Cardiovascular;  Laterality: N/A;   TEE WITHOUT CARDIOVERSION N/A 03/06/2015   Procedure: TRANSESOPHAGEAL ECHOCARDIOGRAM (TEE);  Surgeon: Laurey Morale, MD;  Location: [facility] ENDOSCOPY;  Service: Cardiovascular;  Laterality: N/A;   TEE WITHOUT CARDIOVERSION N/A 05/02/2015   Procedure: TRANSESOPHAGEAL ECHOCARDIOGRAM (TEE);  Surgeon: Lewayne Bunting, MD;  Location: [facility] ENDOSCOPY;  Service: Cardiovascular;  Laterality: N/A;   TEE WITHOUT CARDIOVERSION N/A 03/24/2022   Procedure: TRANSESOPHAGEAL ECHOCARDIOGRAM (TEE);  Surgeon: Lovett Sox, MD;  Location: [facility] OR;  Service: Open Heart Surgery;  Laterality: N/A;     A IV Location/Drains/Wounds Patient Lines/Drains/Airways Status     Active Line/Drains/Airways     Name Placement date Placement time Site Days   Peripheral IV 04/18/23 20 G Left;Posterior Hand 04/18/23  1939  Hand  1   Peripheral IV 04/18/23 20 G Anterior;Distal;Right;Upper Antecubital 04/18/23  2308  Antecubital  1            Intake/Output Last 24 hours  Intake/Output Summary (Last 24 hours) at 04/19/2023 1235 Last data filed at 04/19/2023 1610 Gross per 24 hour  Intake 350 ml  Output 1200 ml  Net -850 ml    Labs/Imaging Results for orders placed or performed during the hospital encounter of 04/18/23 (from the past 48 hour(s))  Brain natriuretic peptide     Status: Abnormal   Collection Time: 04/18/23  7:38 PM  Result Value Ref Range   B Natriuretic Peptide 355.4 (H) 0.0 - 100.0 pg/mL    Comment: Performed at [facility], [address], [place], [place] [address]  CBC with Differential     Status: Abnormal   Collection Time: 04/18/23  7:38 PM  Result Value Ref Range   WBC 8.9 4.0 - 10.5 K/uL   RBC 4.78 4.22 - 5.81 MIL/uL   Hemoglobin 12.6 (L) 13.0 - 17.0 g/dL   HCT 40.9 81.1 - 91.4 %   MCV 87.7 80.0 - 100.0 fL   MCH 26.4 26.0 - 34.0 pg   MCHC 30.1 30.0 - 36.0 g/dL   RDW 78.2 (H) 95.6 - 21.3 %   Platelets  224 150 - 400 K/uL   nRBC 0.0 0.0 - 0.2 %   Neutrophils Relative % 61 %   Neutro Abs 5.5 1.7 - 7.7 K/uL   Lymphocytes Relative 25 %   Lymphs Abs 2.3 0.7 - 4.0 K/uL   Monocytes Relative 11 %   Monocytes Absolute 0.9 0.1 - 1.0 K/uL   Eosinophils Relative 2 %   Eosinophils Absolute 0.2 0.0 - 0.5 K/uL   Basophils Relative 1 %   Basophils Absolute 0.1 0.0 - 0.1 K/uL   Immature Granulocytes 0 %   Abs Immature Granulocytes 0.02 0.00 - 0.07 K/uL    Comment: Performed at [facility], [address] [place], [place] [address]  Basic metabolic panel     Status: Abnormal   Collection Time: 04/18/23  7:38 PM  Result Value Ref Range   Sodium 137 135 - 145 mmol/L   Potassium 4.4 3.5 -  5.1 mmol/L   Chloride 99 98 - 111 mmol/L   CO2 27 22 - 32 mmol/L   Glucose, Bld 149 (H) 70 - 99 mg/dL    Comment: Glucose reference range applies only to samples taken after fasting for at least 8 hours.   BUN 22 8 - 23 mg/dL   Creatinine, Ser 9.56 0.61 - 1.24 mg/dL   Calcium 8.7 (L) 8.9 - 10.3 mg/dL   GFR, Estimated >21 >30 mL/min    Comment: (NOTE) Calculated using the CKD-EPI Creatinine Equation (2021)    Anion gap 11 5 - 15    Comment: Performed at [facility], [address] [place], [place] [address]  Troponin I (High Sensitivity)     Status: None   Collection Time: 04/18/23  7:38 PM  Result Value Ref Range   Troponin I (High Sensitivity) 13 <18 ng/L    Comment: (NOTE) Elevated high sensitivity troponin I (hsTnI) values and significant  changes across serial measurements may suggest ACS but many other  chronic and acute conditions are known to elevate hsTnI results.  Refer to the "Links" section for chest pain algorithms and additional  guidance. Performed at [facility], [address] [place], [place] [address]   Troponin I (High Sensitivity)     Status: None   Collection Time: 04/19/23  2:53 AM  Result Value Ref Range   Troponin I (High Sensitivity) 17 <18 ng/L     Comment: (NOTE) Elevated high sensitivity troponin I (hsTnI) values and significant  changes across serial measurements may suggest ACS but many other  chronic and acute conditions are known to elevate hsTnI results.  Refer to the "Links" section for chest pain algorithms and additional  guidance. Performed at [facility], [address], [place], [place] [address]   CBC     Status: Abnormal   Collection Time: 04/19/23  2:53 AM  Result Value Ref Range   WBC 8.9 4.0 - 10.5 K/uL   RBC 5.21 4.22 - 5.81 MIL/uL   Hemoglobin 13.6 13.0 - 17.0 g/dL   HCT 13.2 44.0 - 10.2 %   MCV 86.6 80.0 - 100.0 fL   MCH 26.1 26.0 - 34.0 pg   MCHC 30.2 30.0 - 36.0 g/dL   RDW 72.5 (H) 36.6 - 44.0 %   Platelets 237 150 - 400 K/uL   nRBC 0.0 0.0 - 0.2 %    Comment: Performed at [facility], [address], [place], [place] [address]  Basic metabolic panel     Status: Abnormal   Collection Time: 04/19/23  2:53 AM  Result Value Ref Range   Sodium 137 135 - 145 mmol/L   Potassium 4.2 3.5 - 5.1 mmol/L   Chloride 100 98 - 111 mmol/L   CO2 28 22 - 32 mmol/L   Glucose, Bld 222 (H) 70 - 99 mg/dL    Comment: Glucose reference range applies only to samples taken after fasting for at least 8 hours.   BUN 19 8 - 23 mg/dL   Creatinine, Ser 5.95 0.61 - 1.24 mg/dL   Calcium 8.9 8.9 - 63.8 mg/dL   GFR, Estimated >75 >64 mL/min    Comment: (NOTE) Calculated using the CKD-EPI Creatinine Equation (2021)    Anion gap 9 5 - 15    Comment: Performed at [facility], [address], [place], [place] [address]  Procalcitonin     Status: None   Collection Time: 04/19/23  2:53 AM  Result Value Ref Range   Procalcitonin <0.10 ng/mL    Comment:        Interpretation: PCT (Procalcitonin) <= 0.5 ng/mL: Systemic infection (sepsis) is not likely. Local bacterial infection is possible. (NOTE)       Sepsis PCT Algorithm           Lower Respiratory Tract                                      Infection PCT  Algorithm    ----------------------------     ----------------------------         PCT < 0.25 ng/mL                PCT < 0.10 ng/mL          Strongly encourage             Strongly discourage   discontinuation of antibiotics    initiation of antibiotics    ----------------------------     -----------------------------       PCT 0.25 - 0.50 ng/mL            PCT 0.10 - 0.25 ng/mL               OR       >80% decrease in PCT            Discourage initiation of                                            antibiotics      Encourage discontinuation           of antibiotics    ----------------------------     -----------------------------         PCT >= 0.50 ng/mL              PCT 0.26 - 0.50 ng/mL               AND        <80% decrease in PCT             Encourage initiation of                                             antibiotics       Encourage continuation           of antibiotics    ----------------------------     -----------------------------        PCT >= 0.50 ng/mL                  PCT > 0.50 ng/mL               AND         increase in PCT                  Strongly encourage                                      initiation of antibiotics    Strongly encourage escalation  of antibiotics                                     -----------------------------                                           PCT <= 0.25 ng/mL                                                 OR                                        > 80% decrease in PCT                                      Discontinue / Do not initiate                                             antibiotics  Performed at [facility], [address], [place], [place] [address]   Magnesium     Status: None   Collection Time: 04/19/23  2:53 AM  Result Value Ref Range   Magnesium 1.9 1.7 - 2.4 mg/dL    Comment: Performed at [facility], [address] [place], [place] [address]  Phosphorus     Status: None   Collection  Time: 04/19/23  2:53 AM  Result Value Ref Range   Phosphorus 4.2 2.5 - 4.6 mg/dL    Comment: Performed at [facility], [address], [place], [place] [address]  CBG monitoring, ED     Status: Abnormal   Collection Time: 04/19/23  9:05 AM  Result Value Ref Range   Glucose-Capillary 169 (H) 70 - 99 mg/dL    Comment: Glucose reference range applies only to samples taken after fasting for at least 8 hours.  CBG monitoring, ED     Status: Abnormal   Collection Time: 04/19/23 11:46 AM  Result Value Ref Range   Glucose-Capillary 230 (H) 70 - 99 mg/dL    Comment: Glucose reference range applies only to samples taken after fasting for at least 8 hours.   ECHOCARDIOGRAM COMPLETE  Result Date: 04/19/2023    ECHOCARDIOGRAM REPORT   Patient Name:   ARYO ASMAN Date of Exam: 04/19/2023 Medical Rec #:  [id number]      Height:       73.0 in Accession #:    [id number]     Weight:       345.0 lb Date of Birth:  07/12/1956      BSA:          2.713 m Patient Age:    66 years       BP:           135/65 mmHg Patient Gender: M  HR:           62 bpm. Exam Location:  Inpatient Procedure: 2D Echo, Color Doppler, Cardiac Doppler and Intracardiac            Opacification Agent Indications:    Aute Diastolic CHF  History:        Patient has prior history of Echocardiogram examinations, most                 recent 03/14/2022. CHF, Arrythmias:Atrial Fibrillation,                 Signs/Symptoms:Shortness of Breath; Risk Factors:Hypertension                 and Diabetes.  Sonographer:    Raeford Razor Sonographer#2:  Milbert Coulter Referring Phys: Dow Adolph, N  Sonographer Comments: Technically difficult study due to poor echo windows. Image acquisition challenging due to patient body habitus. IMPRESSIONS  1. Left ventricular ejection fraction, by estimation, is 50%. The left ventricle has mildly decreased function. The left ventricle demonstrates global hypokinesis. Left ventricular diastolic parameters are  consistent with Grade II diastolic dysfunction (pseudonormalization).  2. Right ventricular systolic function is mildly reduced. The right ventricular size is normal. Tricuspid regurgitation signal is inadequate for assessing PA pressure.  3. Left atrial size was mild to moderately dilated.  4. The mitral valve is abnormal. Mild mitral valve regurgitation (MR not fully visualized, could be worse). No evidence of mitral stenosis.  5. The aortic valve is tricuspid. Aortic valve regurgitation is not visualized. No aortic stenosis is present.  6. The inferior vena cava is dilated in size with <50% respiratory variability, suggesting right atrial pressure of 15 mmHg.  7. Technically difficult study with very poor images. FINDINGS  Left Ventricle: Left ventricular ejection fraction, by estimation, is 50%. The left ventricle has mildly decreased function. The left ventricle demonstrates global hypokinesis. Definity contrast agent was given IV to delineate the left ventricular endocardial borders. The left ventricular internal cavity size was normal in size. There is no left ventricular hypertrophy. Left ventricular diastolic parameters are consistent with Grade II diastolic dysfunction (pseudonormalization). Right Ventricle: The right ventricular size is normal. No increase in right ventricular wall thickness. Right ventricular systolic function is mildly reduced. Tricuspid regurgitation signal is inadequate for assessing PA pressure. Left Atrium: Left atrial size was mild to moderately dilated. Right Atrium: Right atrial size was normal in size. Pericardium: There is no evidence of pericardial effusion. Mitral Valve: The mitral valve is abnormal. Mild mitral valve regurgitation. No evidence of mitral valve stenosis. Tricuspid Valve: The tricuspid valve is normal in structure. Tricuspid valve regurgitation is trivial. Aortic Valve: The aortic valve is tricuspid. Aortic valve regurgitation is not visualized. No aortic  stenosis is present. Aortic valve mean gradient measures 3.0 mmHg. Aortic valve peak gradient measures 6.2 mmHg. Aortic valve area, by VTI measures 2.26 cm. Pulmonic Valve: The pulmonic valve was normal in structure. Pulmonic valve regurgitation is not visualized. Aorta: The aortic root is normal in size and structure. Venous: The inferior vena cava is dilated in size with less than 50% respiratory variability, suggesting right atrial pressure of 15 mmHg. IAS/Shunts: No atrial level shunt detected by color flow Doppler.  LEFT VENTRICLE PLAX 2D LVIDd:         6.00 cm     Diastology LVIDs:         4.40 cm     LV e' medial:    5.11 cm/s LV PW:  1.30 cm     LV E/e' medial:  26.4 LV IVS:        1.40 cm     LV e' lateral:   6.41 cm/s LVOT diam:     2.20 cm     LV E/e' lateral: 21.1 LV SV:         60 LV SV Index:   22 LVOT Area:     3.80 cm  LV Volumes (MOD) LV vol d, MOD A4C: 95.6 ml LV vol s, MOD A4C: 47.2 ml LV SV MOD A4C:     95.6 ml RIGHT VENTRICLE             IVC RV S prime:     10.90 cm/s  IVC diam: 3.20 cm TAPSE (M-mode): 2.4 cm LEFT ATRIUM            Index LA diam:      5.80 cm  2.14 cm/m LA Vol (A4C): 111.0 ml 40.92 ml/m  AORTIC VALVE AV Area (Vmax):    2.35 cm AV Area (Vmean):   2.26 cm AV Area (VTI):     2.26 cm AV Vmax:           124.00 cm/s AV Vmean:          81.000 cm/s AV VTI:            0.264 m AV Peak Grad:      6.2 mmHg AV Mean Grad:      3.0 mmHg LVOT Vmax:         76.50 cm/s LVOT Vmean:        48.200 cm/s LVOT VTI:          0.157 m LVOT/AV VTI ratio: 0.59  AORTA Ao Root diam: 3.60 cm MITRAL VALVE MV Area (PHT): 4.21 cm     SHUNTS MV Decel Time: 180 msec     Systemic VTI:  0.16 m MR Peak grad: 36.5 mmHg     Systemic Diam: 2.20 cm MR Vmax:      302.00 cm/s MV E velocity: 135.00 cm/s MV A velocity: 48.20 cm/s MV E/A ratio:  2.80 Arthur McleanMD Electronically signed by Wilfred Lacy Signature Date/Time: 04/19/2023/10:47:18 AM    Final    CT Angio Chest PE W/Cm &/Or Wo Cm  Result Date:  04/19/2023 CLINICAL DATA:  Concern for pulmonary embolism. EXAM: CT ANGIOGRAPHY CHEST WITH CONTRAST TECHNIQUE: Multidetector CT imaging of the chest was performed using the standard protocol during bolus administration of intravenous contrast. Multiplanar CT image reconstructions and MIPs were obtained to evaluate the vascular anatomy. RADIATION DOSE REDUCTION: This exam was performed according to the departmental dose-optimization program which includes automated exposure control, adjustment of the mA and/or kV according to patient size and/or use of iterative reconstruction technique. CONTRAST:  OMNIPAQUE IOHEXOL 350 MG/ML SOLN COMPARISON:  Chest radiograph dated 04/18/2023. FINDINGS: Evaluation of this exam is limited due to respiratory motion artifact. Cardiovascular: There is mild cardiomegaly. No pericardial effusion. Three-vessel coronary vascular calcification. There is moderate atherosclerotic calcification of the thoracic aorta. No aneurysmal dilatation or dissection. Evaluation of the pulmonary arteries is limited due to respiratory motion. No central pulmonary artery embolus identified the Mediastinum/Nodes: No hilar or mediastinal adenopathy. The esophagus is grossly unremarkable. No mediastinal fluid collection. Lungs/Pleura: Areas of airspace opacity involving right middle lobe, bilateral lower lobes, and to a lesser degree right upper lobe most consistent with multi lobar pneumonia. Clinical correlation and follow-up to resolution recommended. No pleural effusion or pneumothorax.  The central airways are patent. Upper Abdomen: No acute abnormality. Musculoskeletal: Degenerative changes of the spine. Median sternotomy wires. No acute osseous pathology. Review of the MIP images confirms the above findings. IMPRESSION: 1. No CT evidence of central pulmonary artery embolus. 2. Multi lobar pneumonia. Clinical correlation and follow-up to resolution recommended. 3. Mild cardiomegaly with three-vessel  coronary vascular calcification. 4.  Aortic Atherosclerosis (ICD10-I70.0). Electronically Signed   By: Elgie Collard M.D.   On: 04/19/2023 00:57   DG Chest 2 View  Result Date: 04/18/2023 CLINICAL DATA:  SOB EXAM: CHEST - 2 VIEW COMPARISON:  May 18, 2022 FINDINGS: The cardiomediastinal silhouette is unchanged in contour.Status post median sternotomy and CABG. No pleural effusion. No pneumothorax. New RIGHT lateral basilar airspace opacity. LEFT basilar platelike opacities, likely atelectasis. Visualized abdomen is unremarkable. Multilevel degenerative changes of the thoracic spine. IMPRESSION: New RIGHT lateral basilar airspace opacity. Differential considerations include infection, aspiration or other inflammatory process. Follow-up PA and lateral chest radiograph 4-6 weeks after appropriate treatment would be recommended to exclude underlying neoplasm Electronically Signed   By: Meda Klinefelter M.D.   On: 04/18/2023 20:18    Pending Labs Unresulted Labs (From admission, onward)     Start     Ordered   04/20/23 0500  HIV Antibody (routine testing w rflx)  (HIV Antibody (Routine testing w reflex) panel)  Tomorrow morning,   R        04/19/23 0154   04/20/23 0500  CBC  Tomorrow morning,   R        04/19/23 0826   04/20/23 0500  Basic metabolic panel  Tomorrow morning,   R        04/19/23 0826   04/19/23 0755  Hemoglobin A1c  Once,   R       Comments: To assess prior glycemic control    04/19/23 0754            Vitals/Pain Today's Vitals   04/19/23 0915 04/19/23 1115 04/19/23 1116 04/19/23 1130  BP: 135/65 (!) 141/63  139/67  Pulse: (!) 59 69  64  Resp: 18 19  12   Temp:   97.8 F (36.6 C)   TempSrc:      SpO2: 96% 97%  97%  Weight:      Height:      PainSc:        Isolation Precautions No active isolations  Medications Medications  albuterol (VENTOLIN HFA) 108 (90 Base) MCG/ACT inhaler 2 puff (has no administration in time range)  ipratropium-albuterol (DUONEB)  0.5-2.5 (3) MG/3ML nebulizer solution 3 mL (has no administration in time range)  apixaban (ELIQUIS) tablet 5 mg (5 mg Oral Given 04/19/23 0916)  atorvastatin (LIPITOR) tablet 80 mg (80 mg Oral Given 04/19/23 0916)  montelukast (SINGULAIR) tablet 10 mg (10 mg Oral Given 04/19/23 0248)  pantoprazole (PROTONIX) EC tablet 40 mg (40 mg Oral Given 04/19/23 0917)  fluticasone furoate-vilanterol (BREO ELLIPTA) 100-25 MCG/ACT 1 puff (1 puff Inhalation Given 04/19/23 0920)    And  umeclidinium bromide (INCRUSE ELLIPTA) 62.5 MCG/ACT 1 puff (1 puff Inhalation Given 04/19/23 0920)  acetaminophen (TYLENOL) tablet 650 mg (has no administration in time range)  prochlorperazine (COMPAZINE) injection 5 mg (has no administration in time range)  melatonin tablet 5 mg (has no administration in time range)  polyethylene glycol (MIRALAX / GLYCOLAX) packet 17 g (has no administration in time range)  ALPRAZolam (XANAX) tablet 0.25 mg (has no administration in time range)  amiodarone (PACERONE) tablet 100 mg (  100 mg Oral Given 04/19/23 0917)  aspirin EC tablet 81 mg (81 mg Oral Given 04/19/23 0923)  carvedilol (COREG) tablet 3.125 mg (3.125 mg Oral Given 04/19/23 0917)  insulin aspart (novoLOG) injection 0-15 Units (5 Units Subcutaneous Given 04/19/23 1230)  insulin aspart (novoLOG) injection 0-5 Units (has no administration in time range)  perflutren lipid microspheres (DEFINITY) IV suspension (3 mLs Intravenous Given 04/19/23 1045)  furosemide (LASIX) injection 40 mg (has no administration in time range)  cefTRIAXone (ROCEPHIN) 2 g in sodium chloride 0.9 % 100 mL IVPB (0 g Intravenous Stopped 04/19/23 0204)  azithromycin (ZITHROMAX) 500 mg in sodium chloride 0.9 % 250 mL IVPB (0 mg Intravenous Stopped 04/19/23 0238)  iohexol (OMNIPAQUE) 350 MG/ML injection 100 mL (100 mLs Intravenous Contrast Given 04/19/23 0033)    Mobility walks     Focused Assessments Pulmonary Assessment Handoff:  Lung sounds: L Breath Sounds: Expiratory  wheezes R Breath Sounds: Diminished O2 Device: Nasal Cannula O2 Flow Rate (L/min): 3 L/min    R Recommendations: See Admitting Provider Note  Report given to:   Additional Notes: Patient is AOX4, able to verbalize his needs, walked with PT, they recommended [place] titrating him down on oxygent while awake and sitting if possible, currently on 3L

## 2023-04-19 NOTE — H&P (Addendum)
History and Physical  KANIN RICKETTS ZOX:[id number] DOB: 31-Jan-1956 DOA: 04/18/2023  Referring physician: Elijah Birk  PCP: Chyrel Masson  Outpatient Specialists: Cardiology Patient coming from: Home.  Chief Complaint: Shortness of breath and hypoxia.  HPI: ALEJOS ARLINE is a 67 y.o. male with medical history significant for chronic HFpEF 55 to 60%, paroxysmal atrial fibrillation on Eliquis, prediabetes, coronary artery disease, severe morbid obesity, OSA (not on CPAP for more than a year and declines use), chronic nocturnal hypoxia on 2 L nasal cannula nightly, COPD with chronic bronchitis and emphysema, nonischemic cardiomyopathy, who presented to [facility] ED from home due to progressively worsening shortness of breath with onset on Tuesday, 04/13/2023.  Associated with a productive cough with streaks of blood in it.  Endorses lower extremity edema bilaterally.  States he has been checking his oxygen saturation at home and it has been running in the 70s with activities.  Compliant with home Eliquis, has been taking his home p.o. Lasix 80 mg daily as prescribed.  Due to worsening dyspnea he doubled his dose of home diuretics for the past 2 days with minimal improvement.  Presented to the ED for further evaluation.  In the ED, afebrile with no leukocytosis.  O2 saturation improved on 2 L to 93%.  Chest x-ray revealed new right lateral basilar airspace opacity, subsequently had a CT angio chest PE with and without contrast which revealed no CT evidence of central pulmonary artery embolus.  It showed multilobar pneumonia and pulmonary edema.  Mild cardiomegaly with three-vessel coronary vascular calcification.  Aortic atherosclerosis.  The patient was started on IV antibiotics empirically for community-acquired pneumonia, Rocephin and azithromycin.  TRH, hospitalist service, was asked to admit.  IV diuretics added due to concern for acute on chronic diastolic CHF.   ED Course: Tmax 98.2.  BP  147/92, pulse 56, respiratory 20, O2 saturation 96% on 2 L.  Lab studies remarkable for BNP 355.  High-sensitivity troponin 13.  WBC 8.9.  Serum glucose 149.  Review of Systems: Review of systems as noted in the HPI. All other systems reviewed and are negative.   Past Medical History:  Diagnosis Date   Chronic systolic CHF (congestive heart failure) (HCC)    a. 2D ECHO (03/06/15) EF 20-25%, diffuse HK. Asc aortic diameter: 40mm. Mild LA dilation, mild RV dilation. Mild RV systolic dysfunction   b. LifeVest placed on 02/2015 admissoin    Coronary artery disease    a. 70% LAD lesion   Elevated TSH    ETOH abuse    Hypertension    Morbid obesity (HCC)    a. BMI ~46   Persistent atrial fibrillation (HCC)    a. newly dx 02/2015 admission. s/p successful TEE/DCCV  b. on Eliquis   Pre-diabetes    a. HgA1c 6.1   Past Surgical History:  Procedure Laterality Date   ATRIAL FIBRILLATION ABLATION N/A 01/28/2023   Procedure: ATRIAL FIBRILLATION ABLATION;  Surgeon: Regan Lemming, MD;  Location: [facility] INVASIVE CV LAB;  Service: Cardiovascular;  Laterality: N/A;   CARDIAC CATHETERIZATION N/A 04/26/2015   Procedure: Left Heart Cath and Coronary Angiography;  Surgeon: Lyn Records, MD;  Location: [facility] INVASIVE CV LAB;  Service: Cardiovascular;  Laterality: N/A;   CARDIOVERSION N/A 03/06/2015   Procedure: CARDIOVERSION;  Surgeon: Laurey Morale, MD;  Location: [facility] ENDOSCOPY;  Service: Cardiovascular;  Laterality: N/A;   CARDIOVERSION N/A 06/02/2022   Procedure: CARDIOVERSION;  Surgeon: Vesta Mixer, MD;  Location: [facility] ENDOSCOPY;  Service:  Cardiovascular;  Laterality: N/A;   CARDIOVERSION N/A 07/14/2022   Procedure: CARDIOVERSION;  Surgeon: Thurmon Fair, MD;  Location: [facility] ENDOSCOPY;  Service: Cardiovascular;  Laterality: N/A;   CORONARY ANGIOGRAPHY N/A 03/18/2022   Procedure: CORONARY ANGIOGRAPHY;  Surgeon: Kathleene Hazel, MD;  Location: [facility] INVASIVE CV LAB;  Service: Cardiovascular;  Laterality:  N/A;   CORONARY ARTERY BYPASS GRAFT N/A 03/24/2022   Procedure: CORONARY ARTERY BYPASS GRAFTING (CABG) TIMES TWO USING LEFT INTERNAL MAMMARY ARTERY AND ENDOSCOPICALLY HARVESTED RIGHT GREATER SAPHENOUS VEIN;  Surgeon: Lovett Sox, MD;  Location: [facility] OR;  Service: Open Heart Surgery;  Laterality: N/A;   ENDOVEIN HARVEST OF GREATER SAPHENOUS VEIN Right 03/24/2022   Procedure: ENDOVEIN HARVEST OF GREATER SAPHENOUS VEIN;  Surgeon: Lovett Sox, MD;  Location: [facility] OR;  Service: Open Heart Surgery;  Laterality: Right;   HERNIA REPAIR     LEFT HEART CATH N/A 03/16/2022   Procedure: Left Heart Cath;  Surgeon: [name], MD;  Location: [facility] INVASIVE CV LAB;  Service: Cardiovascular;  Laterality: N/A;   TEE WITHOUT CARDIOVERSION N/A 03/06/2015   Procedure: TRANSESOPHAGEAL ECHOCARDIOGRAM (TEE);  Surgeon: Laurey Morale, MD;  Location: [facility] ENDOSCOPY;  Service: Cardiovascular;  Laterality: N/A;   TEE WITHOUT CARDIOVERSION N/A 05/02/2015   Procedure: TRANSESOPHAGEAL ECHOCARDIOGRAM (TEE);  Surgeon: Lewayne Bunting, MD;  Location: [facility] ENDOSCOPY;  Service: Cardiovascular;  Laterality: N/A;   TEE WITHOUT CARDIOVERSION N/A 03/24/2022   Procedure: TRANSESOPHAGEAL ECHOCARDIOGRAM (TEE);  Surgeon: Lovett Sox, MD;  Location: [facility] OR;  Service: Open Heart Surgery;  Laterality: N/A;    Social History:  reports that he quit smoking about 10 years ago. His smoking use included cigarettes. He has never used smokeless tobacco. He reports current alcohol use. He reports that he does not use drugs.   No Known Allergies  Family History  Problem Relation Age of Onset   Uterine cancer Sister    Heart attack Mother    Breast cancer Mother    Sleep apnea Brother    Heart attack Maternal Grandfather    Sleep apnea Brother       Prior to Admission medications   Medication Sig Start Date End Date Taking? Authorizing Provider  acetaminophen (TYLENOL) 500 MG tablet Take 500 mg by mouth every 6 (six) hours as needed for  mild pain or moderate pain.    [provider]  ALPRAZolam Prudy Feeler) 0.5 MG tablet TAKE 1 TABLET BY MOUTH THREE TIMES DAILYAS NEEDED FOR ANXIETY 08/04/19   Monica Becton, MD  AMBULATORY NON FORMULARY MEDICATION Portable oxygen concentrator, 3 L [place].  Please fax to [facility] Patient taking differently: Inhale 3 L into the lungs at bedtime. Portable oxygen concentrator 3 L (Do not use)  Please fax to [facility] 03/17/19   Monica Becton, MD  amiodarone (PACERONE) 200 MG tablet TAKE 1 TABLET(200 MG) BY MOUTH EVERY MORNING 12/28/22   Newman Nip, NP  apixaban (ELIQUIS) 5 MG TABS tablet Take 1 tablet (5 mg total) by mouth 2 (two) times daily. 02/27/19   Hilty, Lisette Abu, MD  Artificial Tear Ointment (DRY EYES OP) Place 1 drop into both nostrils daily as needed (dry eyes).    [provider]  aspirin EC 81 MG EC tablet Take 1 tablet (81 mg total) by mouth daily. Swallow whole. 04/02/22   Barrett, Erin R, PA-C  atorvastatin (LIPITOR) 80 MG tablet Take 1 tablet (80 mg total) by mouth daily. 05/24/19   Jodelle Gross, NP  calcium carbonate (TUMS -  DOSED IN MG ELEMENTAL CALCIUM) 500 MG chewable tablet Chew 3 tablets by mouth 2 (two) times daily as needed for indigestion or heartburn.    [provider]  carvedilol (COREG) 12.5 MG tablet TAKE 1 TABLET(12.5 MG) BY MOUTH TWICE DAILY 03/30/23   Fenton, Clint R, PA  diphenhydramine-acetaminophen (TYLENOL PM) 25-500 MG TABS tablet Take 1 tablet by mouth at bedtime.    [provider]  furosemide (LASIX) 40 MG tablet TAKE 1 TABLET BY MOUTH EVERY DAY. MAY TAKE ADDITIONAL TABLET FOR WEIGHT GAIN OF 3LBS OVER NIGHT OR 5 LBS GAIN IN 1 WEEK 03/31/23   Joylene Grapes, NP  hydrocortisone 2.5 % cream Apply 1 Application topically daily as needed (rash).    [provider]  loratadine (CLARITIN) 10 MG tablet Take 10 mg by mouth daily.    [provider]  Magnesium 400 MG TABS Take 400 mg by mouth 2 (two)  times a day. 07/14/19   Hilty, Lisette Abu, MD  montelukast (SINGULAIR) 10 MG tablet Take 10 mg by mouth at bedtime. 01/14/22   [provider]  oxymetazoline (AFRIN) 0.05 % nasal spray Place 1 spray into left nostril daily as needed for congestion.    [provider]  pantoprazole (PROTONIX) 40 MG tablet Take 1 tablet (40 mg total) by mouth daily. 11/30/19   Monica Becton, MD  potassium chloride (KLOR-CON) 10 MEQ tablet TAKE 1 TABLET(10 MEQ) BY MOUTH DAILY 03/31/23   Bernadene Person C, NP  sildenafil (REVATIO) 20 MG tablet Take 2-3 tablets (40-60 mg total) by mouth as needed. 02/10/23   Hilty, Lisette Abu, MD  TRELEGY ELLIPTA 100-62.5-25 MCG/ACT AEPB Inhale 1 puff into the lungs daily. 02/18/22   [provider]    Physical Exam: BP (!) 155/72   Pulse (!) 56   Temp 98.2 F (36.8 C)   Resp (!) 21   Ht 6\' 1"  (1.854 m)   Wt (!) 156.5 kg   SpO2 93%   BMI 45.52 kg/m   General: 67 y.o. year-old male well developed well nourished in no acute distress.  Alert and oriented x3. Cardiovascular: Regular rate and rhythm with no rubs or gallops.  No thyromegaly. Left JVD noted.  1+ pitting edema in lower extremity edema bilaterally. Respiratory: Diffuse rales bilaterally.  No wheezing noted.  Poor inspiratory effort. Abdomen: Soft nontender nondistended with normal bowel sounds x4 quadrants. Muskuloskeletal: No cyanosis or clubbing noted bilaterally Neuro: CN II-XII intact, strength, sensation, reflexes Skin: No ulcerative lesions noted or rashes Psychiatry: Judgement and insight appear normal. Mood is appropriate for condition and setting          Labs on Admission:  Basic Metabolic Panel: Recent Labs  Lab 04/18/23 1938  NA 137  K 4.4  CL 99  CO2 27  GLUCOSE 149*  BUN 22  CREATININE 0.83  CALCIUM 8.7*   Liver Function Tests: No results for input(s): "AST", "ALT", "ALKPHOS", "BILITOT", "PROT", "ALBUMIN" in the last 168 hours. No results for input(s): "LIPASE",  "AMYLASE" in the last 168 hours. No results for input(s): "AMMONIA" in the last 168 hours. CBC: Recent Labs  Lab 04/18/23 1938  WBC 8.9  NEUTROABS 5.5  HGB 12.6*  HCT 41.9  MCV 87.7  PLT 224   Cardiac Enzymes: No results for input(s): "CKTOTAL", "CKMB", "CKMBINDEX", "TROPONINI" in the last 168 hours.  BNP (last 3 results) Recent Labs    04/18/23 1938  BNP 355.4*    ProBNP (last 3 results) No results  for input(s): "PROBNP" in the last 8760 hours.  CBG: No results for input(s): "GLUCAP" in the last 168 hours.  Radiological Exams on Admission: CT Angio Chest PE W/Cm &/Or Wo Cm  Result Date: 04/19/2023 CLINICAL DATA:  Concern for pulmonary embolism. EXAM: CT ANGIOGRAPHY CHEST WITH CONTRAST TECHNIQUE: Multidetector CT imaging of the chest was performed using the standard protocol during bolus administration of intravenous contrast. Multiplanar CT image reconstructions and MIPs were obtained to evaluate the vascular anatomy. RADIATION DOSE REDUCTION: This exam was performed according to the departmental dose-optimization program which includes automated exposure control, adjustment of the mA and/or kV according to patient size and/or use of iterative reconstruction technique. CONTRAST:  OMNIPAQUE IOHEXOL 350 MG/ML SOLN COMPARISON:  Chest radiograph dated 04/18/2023. FINDINGS: Evaluation of this exam is limited due to respiratory motion artifact. Cardiovascular: There is mild cardiomegaly. No pericardial effusion. Three-vessel coronary vascular calcification. There is moderate atherosclerotic calcification of the thoracic aorta. No aneurysmal dilatation or dissection. Evaluation of the pulmonary arteries is limited due to respiratory motion. No central pulmonary artery embolus identified the Mediastinum/Nodes: No hilar or mediastinal adenopathy. The esophagus is grossly unremarkable. No mediastinal fluid collection. Lungs/Pleura: Areas of airspace opacity involving right middle lobe,  bilateral lower lobes, and to a lesser degree right upper lobe most consistent with multi lobar pneumonia. Clinical correlation and follow-up to resolution recommended. No pleural effusion or pneumothorax. The central airways are patent. Upper Abdomen: No acute abnormality. Musculoskeletal: Degenerative changes of the spine. Median sternotomy wires. No acute osseous pathology. Review of the MIP images confirms the above findings. IMPRESSION: 1. No CT evidence of central pulmonary artery embolus. 2. Multi lobar pneumonia. Clinical correlation and follow-up to resolution recommended. 3. Mild cardiomegaly with three-vessel coronary vascular calcification. 4.  Aortic Atherosclerosis (ICD10-I70.0). Electronically Signed   By: Elgie Collard M.D.   On: 04/19/2023 00:57   DG Chest 2 View  Result Date: 04/18/2023 CLINICAL DATA:  SOB EXAM: CHEST - 2 VIEW COMPARISON:  May 18, 2022 FINDINGS: The cardiomediastinal silhouette is unchanged in contour.Status post median sternotomy and CABG. No pleural effusion. No pneumothorax. New RIGHT lateral basilar airspace opacity. LEFT basilar platelike opacities, likely atelectasis. Visualized abdomen is unremarkable. Multilevel degenerative changes of the thoracic spine. IMPRESSION: New RIGHT lateral basilar airspace opacity. Differential considerations include infection, aspiration or other inflammatory process. Follow-up PA and lateral chest radiograph 4-6 weeks after appropriate treatment would be recommended to exclude underlying neoplasm Electronically Signed   By: Meda Klinefelter M.D.   On: 04/18/2023 20:18    EKG: I independently viewed the EKG done and my findings are as followed: Sinus rhythm rate of 59.  Nonspecific ST-T changes.  QTc 462.  Assessment/Plan Present on Admission:  CAP (community acquired pneumonia)  Principal Problem:   CAP (community acquired pneumonia)  Multifocal community-acquired pneumonia, seen on CT scan, POA Started on Rocephin and IV  azithromycin in the ED, continue Currently afebrile with no leukocytosis Bronchodilators, antitussives  Acute on chronic HFpEF Presented with elevated BNP greater than 300, suspected pulmonary edema on CT scan, bilateral lower extremity edema, left JVD. Last 2D echo done on 03/14/2022 revealed LVEF 55 to 60% and grade 1 diastolic dysfunction Resume home cardiac medications IV diuretics, closely monitor electrolytes and renal function while on IV diuretics. Strict I's and O's and daily weight  Acute on chronic hypoxic respiratory failure secondary to multifocal community-acquired pneumonia and pulmonary edema At baseline on 2 L oxygen nightly Currently on 2 L nasal cannula continuously to  maintain O2 saturation greater than 90% Wean off O2 supplementation as tolerated Incentive spirometer Bronchodilators as needed Early mobilization as tolerated.  Paroxysmal A-fib on Eliquis Currently rate controlled Resume home amiodarone Resume home Eliquis Monitor on telemetry  Coronary artery disease status post CABG Resume home aspirin, Coreg and Lipitor  Chronic anxiety Resume home Xanax as needed  Hyperlipidemia Resume home Lipitor  History of emphysema, chronic bronchitis Resume home regimen  GERD Resume home PPI  Severe morbid obesity with BMI of 45 Recommend weight loss outpatient with regular physical activity and healthy dieting.  OSA Not on CPAP for more than a year and declines the use  Physical debility PT OT assessment Fall precautions.    DVT prophylaxis: Home Eliquis.  Code Status: Full code  Family Communication: None at bedside.  Disposition Plan: Admitted to telemetry medical unit.  Consults called: None.  Admission status: Inpatient status.   Status is: Inpatient The patient requires at least 2 midnights for further evaluation and treatment of present condition.    Darlin Drop MD [facility] Pager [phone]  If 7PM-7AM, please  contact night-coverage [email or web address] Password [email or web address]  04/19/2023, 1:37 AM

## 2023-04-19 NOTE — Progress Notes (Signed)
Echocardiogram 2D Echocardiogram has been performed.  Arthur Church 04/19/2023, 10:47 AM

## 2023-04-19 NOTE — Evaluation (Signed)
Physical Therapy Evaluation Patient Details Name: Arthur Church MRN: [id number] DOB: Aug 31, 1956 Today's Date: 04/19/2023  History of Present Illness  67 y.o. male presents to [facility] hospital on 04/18/2023 with SOB, productive cough with streaks of blood. CT chest demonstrates multilobar PNA. Pt also undergoing management of acute on chronic HFpEF. PMH includes CHF, PAF, CAD, OSA, COPD.  Clinical Impression  Pt presents to PT with deficits in activity tolerance and cardiopulmonary function. Pt is able to ambulate independently at this time, but does desaturate when ambulating, even on 4L [place]. Pt does express fair awareness of pursed lip breathing strategies and acknowledges that he appears to recover quickly when seated and resting. Pt will benefit from continued ambulation in an effort to improve endurance and community level activity tolerance.     Recommendations for follow up therapy are one component of a multi-disciplinary discharge planning process, led by the attending physician.  Recommendations may be updated based on patient status, additional functional criteria and insurance authorization.  Follow Up Recommendations       Assistance Recommended at Discharge PRN  Patient can return home with the following       Equipment Recommendations None recommended by PT  Recommendations for Other Services       Functional Status Assessment Patient has had a recent decline in their functional status and demonstrates the ability to make significant improvements in function in a reasonable and predictable amount of time.     Precautions / Restrictions Precautions Precautions: Other (comment) Precaution Comments: monitor SpO2 Restrictions Weight Bearing Restrictions: No      Mobility  Bed Mobility Overal bed mobility: Needs Assistance Bed Mobility: Supine to Sit, Sit to Supine     Supine to sit: Min assist Sit to supine: Supervision        Transfers Overall transfer level:  Independent Equipment used: None Transfers: Sit to/from Stand Sit to Stand: Independent                Ambulation/Gait Ambulation/Gait assistance: Modified independent (Device/Increase time) Gait Distance (Feet): 250 Feet Assistive device: None Gait Pattern/deviations: Step-through pattern, Wide base of support Gait velocity: reduced Gait velocity interpretation: <1.31 ft/sec, indicative of household ambulator   General Gait Details: pt with slowed step-through gait  Stairs            Wheelchair Mobility    Modified Rankin (Stroke Patients Only)       Balance Overall balance assessment: Needs assistance Sitting-balance support: No upper extremity supported, Feet supported Sitting balance-Leahy Scale: Good     Standing balance support: No upper extremity supported, During functional activity Standing balance-Leahy Scale: Good                               Pertinent Vitals/Pain Pain Assessment Pain Assessment: No/denies pain    Home Living Family/patient expects to be discharged to:: Private residence Living Arrangements: Alone Available Help at Discharge: Family Type of Home: House Home Access: Level entry       Home Layout: One level Home Equipment: Shower seat      Prior Function Prior Level of Function : Independent/Modified Independent;Driving             Mobility Comments: walks one mile twice a day       Hand Dominance   Dominant Hand: Right    Extremity/Trunk Assessment   Upper Extremity Assessment Upper Extremity Assessment: Overall WFL for tasks assessed  Lower Extremity Assessment Lower Extremity Assessment: RLE deficits/detail;LLE deficits/detail RLE Deficits / Details: BLE edema noted at feet LLE Deficits / Details: BLE edema noted at feet    Cervical / Trunk Assessment Cervical / Trunk Assessment: Other exceptions Cervical / Trunk Exceptions: morbid obesity  Communication   Communication: No  difficulties  Cognition Arousal/Alertness: Awake/alert Behavior During Therapy: WFL for tasks assessed/performed Overall Cognitive Status: Within Functional Limits for tasks assessed                                          General Comments General comments (skin integrity, edema, etc.): pt on 3L [place] upon PT arrival, sats in mid 90s. Pt desats to 82% when ambulating on 3L [place], 84% on 4L [place]. Pt does recover to 90s within 1-2 minutes of seated resting consistently. Pt desats to 89% on room air at rest    Exercises     Assessment/Plan    PT Assessment Patient needs continued PT services  PT Problem List Decreased activity tolerance;Cardiopulmonary status limiting activity       PT Treatment Interventions DME instruction;Gait training;Patient/family education;Therapeutic exercise;Therapeutic activities    PT Goals (Current goals can be found in the Care Plan section)  Acute Rehab PT Goals Patient Stated Goal: to return home, improve activity tolerance PT Goal Formulation: With patient Time For Goal Achievement: 05/03/23 Potential to Achieve Goals: Fair Additional Goals Additional Goal #1: Pt will report 1/4 DOE or less when ambulating for >500' to demonstrate improved activity tolerance Additional Goal #2: Pt will be able to verbalize energy conservation strategies including monitoring SpO2 and taking seated rest breaks when sats drop below 88% in an effort to recover.    Frequency Min 2X/week     Co-evaluation               AM-PAC PT "6 Clicks" Mobility  Outcome Measure Help needed turning from your back to your side while in a flat bed without using bedrails?: A Little Help needed moving from lying on your back to sitting on the side of a flat bed without using bedrails?: A Little Help needed moving to and from a bed to a chair (including a wheelchair)?: None Help needed standing up from a chair using your arms (e.g., wheelchair or bedside chair)?:  None Help needed to walk in hospital room?: None Help needed climbing 3-5 steps with a railing? : None 6 Click Score: 22    End of Session Equipment Utilized During Treatment: Oxygen Activity Tolerance: Patient tolerated treatment well Patient left: in bed;with call bell/phone within reach Nurse Communication: Mobility status PT Visit Diagnosis: Other abnormalities of gait and mobility (R26.89)    Time: 1130-1155 PT Time Calculation (min) (ACUTE ONLY): 25 min   Charges:   PT Evaluation $PT Eval Low Complexity: 1 Low          Arlyss Gandy, PT, DPT Acute Rehabilitation Office [phone]   Arlyss Gandy 04/19/2023, 1:21 PM

## 2023-04-20 ENCOUNTER — Other Ambulatory Visit (HOSPITAL_COMMUNITY): Payer: Self-pay

## 2023-04-20 ENCOUNTER — Encounter (HOSPITAL_COMMUNITY): Payer: Self-pay | Admitting: Internal Medicine

## 2023-04-20 DIAGNOSIS — J9601 Acute respiratory failure with hypoxia: Secondary | ICD-10-CM | POA: Diagnosis not present

## 2023-04-20 DIAGNOSIS — I5043 Acute on chronic combined systolic (congestive) and diastolic (congestive) heart failure: Secondary | ICD-10-CM | POA: Diagnosis not present

## 2023-04-20 DIAGNOSIS — I5033 Acute on chronic diastolic (congestive) heart failure: Secondary | ICD-10-CM | POA: Diagnosis not present

## 2023-04-20 LAB — CBC
HCT: 40.3 % (ref 39.0–52.0)
Hemoglobin: 12.5 g/dL — ABNORMAL LOW (ref 13.0–17.0)
MCH: 26.5 pg (ref 26.0–34.0)
MCHC: 31 g/dL (ref 30.0–36.0)
MCV: 85.6 fL (ref 80.0–100.0)
Platelets: 238 10*3/uL (ref 150–400)
RBC: 4.71 MIL/uL (ref 4.22–5.81)
RDW: 16.3 % — ABNORMAL HIGH (ref 11.5–15.5)
WBC: 11.6 10*3/uL — ABNORMAL HIGH (ref 4.0–10.5)
nRBC: 0 % (ref 0.0–0.2)

## 2023-04-20 LAB — BASIC METABOLIC PANEL
Anion gap: 7 (ref 5–15)
BUN: 18 mg/dL (ref 8–23)
CO2: 33 mmol/L — ABNORMAL HIGH (ref 22–32)
Calcium: 9 mg/dL (ref 8.9–10.3)
Chloride: 97 mmol/L — ABNORMAL LOW (ref 98–111)
Creatinine, Ser: 0.77 mg/dL (ref 0.61–1.24)
GFR, Estimated: >60 mL/min (ref >60)
Glucose, Bld: 153 mg/dL — ABNORMAL HIGH (ref 70–99)
Potassium: 3.9 mmol/L (ref 3.5–5.1)
Sodium: 137 mmol/L (ref 135–145)

## 2023-04-20 LAB — T4, FREE: Free T4: 1.14 ng/dL — ABNORMAL HIGH (ref 0.61–1.12)

## 2023-04-20 LAB — STREP PNEUMONIAE URINARY ANTIGEN: Strep Pneumo Urinary Antigen: NEGATIVE

## 2023-04-20 LAB — GLUCOSE, CAPILLARY
Glucose-Capillary: 117 mg/dL — ABNORMAL HIGH (ref 70–99)
Glucose-Capillary: 102 mg/dL — ABNORMAL HIGH (ref 70–99)
Glucose-Capillary: 131 mg/dL — ABNORMAL HIGH (ref 70–99)
Glucose-Capillary: 141 mg/dL — ABNORMAL HIGH (ref 70–99)

## 2023-04-20 LAB — HIV ANTIBODY (ROUTINE TESTING W REFLEX): HIV Screen 4th Generation wRfx: NONREACTIVE

## 2023-04-20 LAB — TSH: TSH: 1.68 u[IU]/mL (ref 0.350–4.500)

## 2023-04-20 MED ORDER — LOSARTAN POTASSIUM 25 MG PO TABS
12.5000 mg | ORAL_TABLET | Freq: Every day | ORAL | Status: DC
  Administered 2023-04-20 – 2023-04-23 (×4): 12.5 mg via ORAL
  Filled 2023-04-20 (×4): qty 0.5

## 2023-04-20 MED ORDER — FUROSEMIDE 10 MG/ML IJ SOLN
60.0000 mg | Freq: Two times a day (BID) | INTRAMUSCULAR | Status: DC
  Administered 2023-04-20 – 2023-04-23 (×6): 60 mg via INTRAVENOUS
  Filled 2023-04-20 (×6): qty 6

## 2023-04-20 MED ORDER — AMOXICILLIN-POT CLAVULANATE 875-125 MG PO TABS
1.0000 | ORAL_TABLET | Freq: Two times a day (BID) | ORAL | Status: DC
  Administered 2023-04-20 – 2023-04-23 (×7): 1 via ORAL
  Filled 2023-04-20 (×7): qty 1

## 2023-04-20 MED ORDER — SPIRONOLACTONE 12.5 MG HALF TABLET
12.5000 mg | ORAL_TABLET | Freq: Every day | ORAL | Status: DC
  Administered 2023-04-20 – 2023-04-23 (×4): 12.5 mg via ORAL
  Filled 2023-04-20 (×4): qty 1

## 2023-04-20 MED ORDER — APIXABAN 5 MG PO TABS
5.0000 mg | ORAL_TABLET | Freq: Two times a day (BID) | ORAL | Status: DC
  Administered 2023-04-21 – 2023-04-23 (×5): 5 mg via ORAL
  Filled 2023-04-20 (×5): qty 1

## 2023-04-20 NOTE — Evaluation (Signed)
Occupational Therapy Evaluation Patient Details Name: Arthur Church MRN: [id number] DOB: Sep 24, 1956 Today's Date: 04/20/2023   History of Present Illness 67 y.o. male presents to [facility] hospital on 04/18/2023 with SOB, productive cough with streaks of blood. CT chest demonstrates multilobar PNA. Pt also undergoing management of acute on chronic HFpEF. PMH includes CHF, PAF, CAD, OSA, COPD.   Clinical Impression   Pt s/p above diagnosis. Pt motivated to participate and return to PLOF, does not feel limited by diagnosis currently, feeling much better. PLOF independent and driving, currently requires increased need for O2, desat down to 78 when off for a few minutes during toileting, recovered back to 95% after 5 minutes, asymptomatic. Pt instructed on safety awareness current need for constant O2 at 2L, nursing plans to wean off to room air. Pt does not need continued OT, no follow up necessary, Pt has all equipment at home needed to return independently.      Recommendations for follow up therapy are one component of a multi-disciplinary discharge planning process, led by the attending physician.  Recommendations may be updated based on patient status, additional functional criteria and insurance authorization.   Assistance Recommended at Discharge None  Patient can return home with the following Other (comment) (no assistance needed)    Functional Status Assessment  Patient has had a recent decline in their functional status and demonstrates the ability to make significant improvements in function in a reasonable and predictable amount of time.  Equipment Recommendations  None recommended by OT    Recommendations for Other Services       Precautions / Restrictions Restrictions Weight Bearing Restrictions: No      Mobility Bed Mobility               General bed mobility comments: arrived sitting in chair, independent stand/ambulation    Transfers Overall transfer level:  Independent Equipment used: None Transfers: Sit to/from Stand Sit to Stand: Independent                  Balance Overall balance assessment: Independent                                         ADL either performed or assessed with clinical judgement   ADL Overall ADL's : Independent                                       General ADL Comments: Pt able to perform ADLs independently, no LOB, good activity tolerance. Pt O2 desat to 78% after using restroom off O2 for a few minutes, Pt did not feel any symptoms, recovered to 95% after a few minutes     Vision Baseline Vision/History: 0 No visual deficits Ability to See in Adequate Light: 0 Adequate Patient Visual Report: No change from baseline       Perception     Praxis      Pertinent Vitals/Pain Pain Assessment Pain Assessment: No/denies pain     Hand Dominance Right   Extremity/Trunk Assessment Upper Extremity Assessment Upper Extremity Assessment: Overall WFL for tasks assessed           Communication Communication Communication: No difficulties   Cognition Arousal/Alertness: Awake/alert Behavior During Therapy: WFL for tasks assessed/performed Overall Cognitive Status: Within Functional Limits for tasks assessed  General Comments       Exercises     Shoulder Instructions      Home Living Family/patient expects to be discharged to:: Private residence Living Arrangements: Alone Available Help at Discharge: Family Type of Home: House Home Access: Level entry     Home Layout: One level     Bathroom Shower/Tub: Producer, television/film/video: Standard     Home Equipment: Production assistant, radio - single point          Prior Functioning/Environment Prior Level of Function : Independent/Modified Independent;Driving                        OT Problem List: Cardiopulmonary status limiting  activity      OT Treatment/Interventions:      OT Goals(Current goals can be found in the care plan section) Acute Rehab OT Goals Patient Stated Goal: to return home, recover to PLOF OT Goal Formulation: With patient Time For Goal Achievement: 05/04/23 Potential to Achieve Goals: Good  OT Frequency:      Co-evaluation              AM-PAC OT "6 Clicks" Daily Activity     Outcome Measure Help from another person eating meals?: None Help from another person taking care of personal grooming?: None Help from another person toileting, which includes using toliet, bedpan, or urinal?: None Help from another person bathing (including washing, rinsing, drying)?: None Help from another person to put on and taking off regular upper body clothing?: None Help from another person to put on and taking off regular lower body clothing?: None 6 Click Score: 24   End of Session Nurse Communication: Mobility status  Activity Tolerance: Patient tolerated treatment well Patient left: in chair;with call bell/phone within reach  OT Visit Diagnosis: Muscle weakness (generalized) (M62.81)                Time: 1610-9604 OT Time Calculation (min): 24 min Charges:  OT General Charges $OT Visit: 1 Visit OT Treatments $Self Care/Home Management : 8-22 mins  [place], OTR/L   Alexis Goodell 04/20/2023, 11:25 AM

## 2023-04-20 NOTE — Progress Notes (Signed)
Patient's daughter called, update provided. She would like Cardiologist involved.

## 2023-04-20 NOTE — TOC Initial Note (Addendum)
Transition of Care [facility]) - Initial/Assessment Note    Patient Details  Name: Arthur Church MRN: [id number] Date of Birth: [birth year]  [facility]) CM/SW Contact:    Kermit Balo, RN Phone Number: 04/20/2023, 12:44 PM  Clinical Narrative:                 Pt is from home alone. He states his daughter can check on him at home. He has DME: cane and shower seat but doesn't currently use them.  Oxygen at night.  Pt drives self as needed.  Pt manages his own medications and denies any issues. Pharmacy: [facility] on [address].  Pt states his daughter will provide transport home when medically ready. No f/u per PT/OT. TOC following.  Expected Discharge Plan: Home/Self Care Barriers to Discharge: Continued Medical Work up   Patient Goals and CMS Choice            Expected Discharge Plan and Services   Discharge Planning Services: CM Consult   Living arrangements for the past 2 months: Single Family Home                                      Prior Living Arrangements/Services Living arrangements for the past 2 months: Single Family Home Lives with:: Self Patient language and need for interpreter reviewed:: Yes Do you feel safe going back to the place where you live?: Yes          Current home services: DME (cane/ shower seat/ oxygen at night with [employer]) Criminal Activity/Legal Involvement Pertinent to Current Situation/Hospitalization: No - Comment as needed  Activities of Daily Living Home Assistive Devices/Equipment: None ADL Screening (condition at time of admission) Patient's cognitive ability adequate to safely complete daily activities?: Yes Is the patient deaf or have difficulty hearing?: No Does the patient have difficulty seeing, even when wearing glasses/contacts?: No Does the patient have difficulty concentrating, remembering, or making decisions?: No Patient able to express need for assistance with ADLs?: Yes Independently  performs ADLs?: No Does the patient have difficulty walking or climbing stairs?: No Weakness of Legs: None Weakness of Arms/Hands: None  Permission Sought/Granted                  Emotional Assessment Appearance:: Appears stated age Attitude/Demeanor/Rapport: Engaged Affect (typically observed): Accepting Orientation: : Oriented to Self, Oriented to Place, Oriented to  Time, Oriented to Situation   Psych Involvement: No (comment)  Admission diagnosis:  CAP (community acquired pneumonia) [J18.9] Acute respiratory failure with hypoxia (HCC) [J96.01] Community acquired pneumonia of right lung, unspecified part of lung [J18.9] Patient Active Problem List   Diagnosis Date Noted   CAP (community acquired pneumonia) 04/19/2023   Hypercoagulable state due to persistent atrial fibrillation (HCC) 03/18/2023   Midline sternotomy scar 05/18/2022   Atrial fibrillation (HCC) 05/15/2022   Chronic combined systolic and diastolic heart failure (HCC) 05/15/2022   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   NSTEMI (non-ST elevated myocardial infarction) (HCC)    Acute coronary syndrome (HCC) 03/14/2022   COPD with chronic bronchitis and emphysema (HCC) 08/31/2019   Shortness of breath 07/17/2019   Medication management 07/17/2019   Radiculitis of left cervical region 04/03/2019   Diabetes mellitus type 2, diet-controlled (HCC) 04/03/2019   LPRD (laryngopharyngeal reflux disease) 02/03/2019   Grieving 04/18/2018   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/2017   Annual physical exam 05/17/2017  Right foot pain 05/17/2017   Chronic respiratory failure with hypoxia (HCC) 05/17/2017   Varicose veins 09/06/2015   Essential hypertension 06/28/2015   PAF (paroxysmal atrial fibrillation) (HCC) 05/03/2015   Non-ischemic cardiomyopathy (HCC) 05/03/2015   Rosacea 04/03/2015   Psoriasis 04/03/2015   Morbid obesity (HCC) 03/05/2015   ETOH abuse 03/05/2015   PCP:  Virgilio Belling, PA-C Pharmacy:    [id number] [place], [place] - [address] AT [facility] [address] [place] [address] Phone: [phone] Fax: [phone]  [facility] [address] [place] [address] Phone: [phone] Fax: [phone]     Social Determinants of Health (SDOH) Social History: SDOH Screenings   Food Insecurity: No Food Insecurity (04/20/2023)  Housing: Low Risk  (04/20/2023)  Transportation Needs: Unknown (04/20/2023)  Alcohol Screen: Low Risk  (04/20/2023)  Depression (PHQ2-9): Low Risk  (07/18/2019)  Financial Resource Strain: Low Risk  (04/20/2023)  Social Connections: Unknown (07/18/2019)  Tobacco Use: Medium Risk (04/20/2023)   SDOH Interventions: Food Insecurity Interventions: Intervention Not Indicated Housing Interventions: Intervention Not Indicated Transportation Interventions: Intervention Not Indicated Alcohol Usage Interventions: Intervention Not Indicated (Score <7) Financial Strain Interventions: Intervention Not Indicated   Readmission Risk Interventions    04/02/2022   10:48 AM 04/01/2022   11:39 AM  Readmission Risk Prevention Plan  Transportation Screening  Complete  PCP or Specialist Appt within 5-7 Days  Complete  PCP or Specialist Appt within 3-5 Days Complete   Home Care Screening  Complete  Medication Review (RN CM)  Complete  HRI or Home Care Consult Complete   Social Work Consult for Recovery Care Planning/Counseling Complete   Palliative Care Screening Not Applicable   Medication Review Oceanographer) Complete

## 2023-04-20 NOTE — Progress Notes (Signed)
Heart Failure Nurse Navigator Progress Note  PCP: Virgilio Belling, PA-C PCP-Cardiologist: [place] Admission Diagnosis: Community acquired pneumonia Admitted from: Home via EMS  Presentation:   Arthur Church presented with shortness of breath, weight gain 20 lbs, and BLE edema, wears oxygen at night, but has been having to wear it during the day due to shortness of breath. BP 155/72, HR 56, BNP 355.4, BMI 45.00, CTA negative for pulmonary embolus, show multilobar pneumonia. IV antibiotics started.   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 Taking all medications as prescribed and attending all medical appointments. Patient verbalized his understanding of all education. A HF TOC appointment was scheduled for 05/31/2023 @ 11 am. Per patient request for after vacation 5/25 - 05/28/23.   ECHO/ LVEF: 50%  Clinical Course:  Past Medical History:  Diagnosis Date   Chronic systolic CHF (congestive heart failure) (HCC)    a. 2D ECHO (03/06/15) EF 20-25%, diffuse HK. Asc aortic diameter: 40mm. Mild LA dilation, mild RV dilation. Mild RV systolic dysfunction   b. LifeVest placed on 02/2015 admissoin    Coronary artery disease    a. 70% LAD lesion   Elevated TSH    ETOH abuse    Hypertension    Morbid obesity (HCC)    a. BMI ~46   Persistent atrial fibrillation (HCC)    a. newly dx 02/2015 admission. s/p successful TEE/DCCV  b. on Eliquis   Pre-diabetes    a. HgA1c 6.1     Social History   Socioeconomic History   Marital status: Widowed    Spouse name: Not on file   Number of children: Not on file   Years of education: Not on file   Highest education level: Not on file  Occupational History   Occupation: security guard  Tobacco Use   Smoking status: Former    Types:  Cigarettes    Quit date: 01/02/2013    Years since quitting: 10.3   Smokeless tobacco: Never   Tobacco comments:    smokes about a week  Substance and Sexual Activity   Alcohol use: Yes    Alcohol/week: 0.0 standard drinks of alcohol   Drug use: No   Sexual activity: Not Currently  Other Topics Concern   Not on file  Social History Narrative   Mr Gilley 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 He reports he is independent with all his care needs and transportation to medical appointments   He is stating he is to either retire or go out on disability soon.    His daughter, Hassel Neth is a SW per pt   Social Determinants of Health   Financial Resource Strain: Not on file  Food Insecurity: No Food Insecurity (07/18/2019)   Hunger Vital Sign    Worried About Running Out of Food in the Last Year: Never true    Ran Out of Food in the Last Year: Never true  Transportation Needs: No Transportation Needs (07/18/2019)   PRAPARE - Administrator, Civil Service (Medical): No    Lack of Transportation (Non-Medical): No  Physical Activity: Not on file  Stress: Not on file  Social Connections: Unknown (07/18/2019)   Social  Connection and Isolation Panel [NHANES]    Frequency of Communication with Friends and Family: Not on file    Frequency of Social Gatherings with Friends and Family: Not on file    Attends Religious Services: Not on file    Active Member of Clubs or Organizations: Not on file    Attends Banker Meetings: Not on file    Marital Status: Widowed   Education Assessment and Provision:  Detailed education and instructions provided on heart failure disease management including the following:  Signs and symptoms of Heart Failure When to call the physician Importance of daily weights Low sodium diet Fluid restriction Medication management Anticipated future follow-up appointments  Patient education given on each of the  above topics.  Patient acknowledges understanding via teach back method and acceptance of all instructions.  Education Materials:  "Living Better With Heart Failure" Booklet, HF zone tool, & Daily Weight Tracker Tool.  Patient has scale at home: yes Patient has pill box at home: yes    High Risk Criteria for Readmission and/or Poor Patient Outcomes: Heart failure hospital admissions (last 6 months): 0  No Show rate: 1% Difficult social situation: No Demonstrates medication adherence: Yes Primary Language: English Literacy level: Reading, writing, and comprehension  Barriers of Care:   Diet/ fluid restrictions ( salt/ soda) Daily weights Continued HF education  Considerations/Referrals:   Referral made to Heart Failure Pharmacist Stewardship: Yes Referral made to Heart Failure CSW/NCM TOC: No Referral made to Heart & Vascular TOC clinic: Yes, 05/31/2023 @ 11 am (per patient request, will be on vacation 5/25 -6/14.   Items for Follow-up on DC/TOC: Diet/fluids restrictions ( salt/soda) Daily weights Continued HF education     Rhae Hammock, BSN, RN Heart Failure Print production planner Chat Only

## 2023-04-20 NOTE — Consult Note (Signed)
Cardiology Consultation   Patient ID: Arthur Church MRN: [id number]; DOB: [birth year]  Admit date: 04/18/2023 Date of Consult: 04/20/2023  PCP:  Chyrel Masson   [facility] Providers Cardiologist:  Chrystie Nose, MD        Patient Profile:   Arthur Church is a 67 y.o. male with a hx of tobacco abuse, ETOH use, HTN, atrial flutter with ablation 01/2023, systolic HF, CM, COPD on home 02 at night, pre-diabetes, CAD with  CABG X 2 V who is being seen 04/20/2023 for the evaluation of acute CHF at the request of Dr Benjamine Mola.  History of Present Illness:   Arthur Church with hx as above, and CAD with RCA and LAD stenosis with CABG 03/24/22 LIMA to LAD and VG to PLA.   Hx of CM with EF 20-25% beginning in 2016- had improved to 55-60% in 2023.  He has persistent atrial fib had been treated with Tikosyn but with CABG tikosyn stopped and placed on amiodarone.  He had several break through atrial fib episodes requiring DCCV.  Ultimately having a fib/flutter ablation.   Had been maintaining SR.  CHA2DS2VAsc is 4.  On Eliquis.  On amiodarone 200 daily. Coreg 12.5 BID pt states he no longer has sleep apnea and that is why machine was taken back.  Pt presented to ER 04/18/23 by EMS with SOB and 20 lb wt gain.  + lower ext edema.  Has had to wear HS 02 at night but also during the day.  Pt increased his lasix to 160 mg from 80 mg without improvement.  BP per EMS 141/110 --he was given solumedrol and nebs.  He complained of hemoptysis also.   BNP was 355 hs troponin 13 >>17  2V CXR IMPRESSION: New RIGHT lateral basilar airspace opacity. Differential considerations include infection, aspiration or other inflammatory process.  Follow-up PA and lateral chest radiograph 4-6 weeks after appropriate treatment would be recommended to exclude underlying Neoplasm  CTA chest:   IMPRESSION: 1. No CT evidence of central pulmonary artery embolus. 2. Multi lobar pneumonia. Clinical correlation and  follow-up to resolution recommended. 3. Mild cardiomegaly with three-vessel coronary vascular calcification. 4.  Aortic Atherosclerosis (ICD10-I70.0).  EKG:  The EKG was personally reviewed and demonstrates:  SB with non specific T wave abnormality 1st degree AV block PR 240 ms  Telemetry:  Telemetry was personally reviewed and demonstrates:  SB 40s to 50s  Echo this admit with EF 50%, global hypokinesis, G2DD LA mild to mod dilated, mild MVR, RA is normal in size.     Started on IV ABX-for PNA, IV diuretics and is neg 1900 since admit Wt today is 154.7 Kg was 150.1 Kg on OV 03/18/23>>OV 02/10/23 145.8 Kg .  Spironolactone and losartan added and coreg decreased with acute HF.  His eliquis held for hemoptysis  Maintaining SR continue amiodarone though here has been decreased to 100 mg from 200 mg due to bradycardia down to 40s agree with monitoring  Pt cannot tolerate cpap for his OSA.   Per chart is on lasix 40 mg daily. But he tells me it is 40 alt with 80 every other day.  Then he increased to 80 daily then 160 daily,.   VS 119/58 P 53 R 17 afebrile  sp02 drops to 88 on RA   Past Medical History:  Diagnosis Date   Chronic systolic CHF (congestive heart failure) (HCC)    a. 2D ECHO (03/06/15) EF 20-25%, diffuse HK.  Asc aortic diameter: 40mm. Mild LA dilation, mild RV dilation. Mild RV systolic dysfunction   b. LifeVest placed on 02/2015 admissoin    Coronary artery disease    a. 70% LAD lesion   Elevated TSH    ETOH abuse    Hypertension    Morbid obesity (HCC)    a. BMI ~46   Persistent atrial fibrillation (HCC)    a. newly dx 02/2015 admission. s/p successful TEE/DCCV  b. on Eliquis   Pre-diabetes    a. HgA1c 6.1    Past Surgical History:  Procedure Laterality Date   ATRIAL FIBRILLATION ABLATION N/A 01/28/2023   Procedure: ATRIAL FIBRILLATION ABLATION;  Surgeon: Regan Lemming, MD;  Location: [facility] INVASIVE CV LAB;  Service: Cardiovascular;  Laterality: N/A;   CARDIAC  CATHETERIZATION N/A 04/26/2015   Procedure: Left Heart Cath and Coronary Angiography;  Surgeon: Lyn Records, MD;  Location: [facility] INVASIVE CV LAB;  Service: Cardiovascular;  Laterality: N/A;   CARDIOVERSION N/A 03/06/2015   Procedure: CARDIOVERSION;  Surgeon: Laurey Morale, MD;  Location: [facility] ENDOSCOPY;  Service: Cardiovascular;  Laterality: N/A;   CARDIOVERSION N/A 06/02/2022   Procedure: CARDIOVERSION;  Surgeon: Vesta Mixer, MD;  Location: [facility] ENDOSCOPY;  Service: Cardiovascular;  Laterality: N/A;   CARDIOVERSION N/A 07/14/2022   Procedure: CARDIOVERSION;  Surgeon: Thurmon Fair, MD;  Location: [facility] ENDOSCOPY;  Service: Cardiovascular;  Laterality: N/A;   CORONARY ANGIOGRAPHY N/A 03/18/2022   Procedure: CORONARY ANGIOGRAPHY;  Surgeon: Kathleene Hazel, MD;  Location: [facility] INVASIVE CV LAB;  Service: Cardiovascular;  Laterality: N/A;   CORONARY ARTERY BYPASS GRAFT N/A 03/24/2022   Procedure: CORONARY ARTERY BYPASS GRAFTING (CABG) TIMES TWO USING LEFT INTERNAL MAMMARY ARTERY AND ENDOSCOPICALLY HARVESTED RIGHT GREATER SAPHENOUS VEIN;  Surgeon: Lovett Sox, MD;  Location: [facility] OR;  Service: Open Heart Surgery;  Laterality: N/A;   ENDOVEIN HARVEST OF GREATER SAPHENOUS VEIN Right 03/24/2022   Procedure: ENDOVEIN HARVEST OF GREATER SAPHENOUS VEIN;  Surgeon: Lovett Sox, MD;  Location: [facility] OR;  Service: Open Heart Surgery;  Laterality: Right;   HERNIA REPAIR     LEFT HEART CATH N/A 03/16/2022   Procedure: Left Heart Cath;  Surgeon: [name], MD;  Location: [facility] INVASIVE CV LAB;  Service: Cardiovascular;  Laterality: N/A;   TEE WITHOUT CARDIOVERSION N/A 03/06/2015   Procedure: TRANSESOPHAGEAL ECHOCARDIOGRAM (TEE);  Surgeon: Laurey Morale, MD;  Location: [facility] ENDOSCOPY;  Service: Cardiovascular;  Laterality: N/A;   TEE WITHOUT CARDIOVERSION N/A 05/02/2015   Procedure: TRANSESOPHAGEAL ECHOCARDIOGRAM (TEE);  Surgeon: Lewayne Bunting, MD;  Location: [facility] ENDOSCOPY;  Service: Cardiovascular;  Laterality:  N/A;   TEE WITHOUT CARDIOVERSION N/A 03/24/2022   Procedure: TRANSESOPHAGEAL ECHOCARDIOGRAM (TEE);  Surgeon: Lovett Sox, MD;  Location: [facility] OR;  Service: Open Heart Surgery;  Laterality: N/A;     Home Medications:  Prior to Admission medications   Medication Sig Start Date End Date Taking? Authorizing Provider  acetaminophen (TYLENOL) 500 MG tablet Take 500 mg by mouth every 6 (six) hours as needed for mild pain, moderate pain or headache.   Yes [provider]  ALPRAZolam (XANAX) 0.5 MG tablet TAKE 1 TABLET BY MOUTH THREE TIMES DAILYAS NEEDED FOR ANXIETY Patient taking differently: Take 0.5 mg by mouth 2 (two) times daily as needed for anxiety. 08/04/19  Yes Monica Becton, MD  amiodarone (PACERONE) 200 MG tablet TAKE 1 TABLET(200 MG) BY MOUTH EVERY MORNING 12/28/22  Yes Newman Nip, NP  apixaban (ELIQUIS) 5 MG TABS tablet Take 1 tablet (5  mg total) by mouth 2 (two) times daily. 02/27/19  Yes Hilty, Lisette Abu, MD  Artificial Tear Ointment (DRY EYES OP) Place 1 drop into both eyes as needed (dry eyes).   Yes [provider]  aspirin EC 81 MG EC tablet Take 1 tablet (81 mg total) by mouth daily. Swallow whole. 04/02/22  Yes Barrett, Erin R, PA-C  atorvastatin (LIPITOR) 80 MG tablet Take 1 tablet (80 mg total) by mouth daily. 05/24/19  Yes Jodelle Gross, NP  calcium carbonate (TUMS - DOSED IN MG ELEMENTAL CALCIUM) 500 MG chewable tablet Chew 3 tablets by mouth 2 (two) times daily as needed for indigestion or heartburn.   Yes [provider]  diphenhydramine-acetaminophen (TYLENOL PM) 25-500 MG TABS tablet Take 1 tablet by mouth at bedtime as needed (sleep/pain).   Yes [provider]  OXYGEN Inhale 2-3 L into the lungs at bedtime.   Yes [provider]  AMBULATORY NON FORMULARY MEDICATION Portable oxygen concentrator, 3 L [place].  Please fax to [facility] Patient taking differently: Inhale 3 L into the lungs at bedtime. Portable oxygen  concentrator 3 L (Do not use)  Please fax to [facility] 03/17/19   Monica Becton, MD  carvedilol (COREG) 12.5 MG tablet TAKE 1 TABLET(12.5 MG) BY MOUTH TWICE DAILY 03/30/23   Fenton, Clint R, PA  furosemide (LASIX) 40 MG tablet TAKE 1 TABLET BY MOUTH EVERY DAY. MAY TAKE ADDITIONAL TABLET FOR WEIGHT GAIN OF 3LBS OVER NIGHT OR 5 LBS GAIN IN 1 WEEK 03/31/23   Joylene Grapes, NP  hydrocortisone 2.5 % cream Apply 1 Application topically daily as needed (rash).    [provider]  loratadine (CLARITIN) 10 MG tablet Take 10 mg by mouth daily.    [provider]  Magnesium 400 MG TABS Take 400 mg by mouth 2 (two) times a day. 07/14/19   Hilty, Lisette Abu, MD  montelukast (SINGULAIR) 10 MG tablet Take 10 mg by mouth at bedtime. 01/14/22   [provider]  oxymetazoline (AFRIN) 0.05 % nasal spray Place 1 spray into left nostril daily as needed for congestion.    [provider]  pantoprazole (PROTONIX) 40 MG tablet Take 1 tablet (40 mg total) by mouth daily. 11/30/19   Monica Becton, MD  potassium chloride (KLOR-CON) 10 MEQ tablet TAKE 1 TABLET(10 MEQ) BY MOUTH DAILY 03/31/23   Bernadene Person C, NP  sildenafil (REVATIO) 20 MG tablet Take 2-3 tablets (40-60 mg total) by mouth as needed. 02/10/23   Hilty, Lisette Abu, MD  TRELEGY ELLIPTA 100-62.5-25 MCG/ACT AEPB Inhale 1 puff into the lungs daily. 02/18/22   [provider]    Inpatient Medications: Scheduled Meds:  amiodarone  100 mg Oral Daily   amoxicillin-clavulanate  1 tablet Oral Q12H   [START ON 04/21/2023] apixaban  5 mg Oral BID   aspirin EC  81 mg Oral Daily   atorvastatin  80 mg Oral Daily   carvedilol  3.125 mg Oral BID WC   fluticasone furoate-vilanterol  1 puff Inhalation Daily   And   umeclidinium bromide  1 puff Inhalation Daily   furosemide  60 mg Intravenous BID   insulin aspart  0-15 Units Subcutaneous TID WC   insulin aspart  0-5 Units Subcutaneous QHS   losartan  12.5 mg Oral Daily    montelukast  10 mg Oral QHS   pantoprazole  40 mg Oral Daily   spironolactone  12.5 mg Oral Daily   Continuous Infusions:  PRN Meds: acetaminophen, albuterol, ALPRAZolam, ipratropium-albuterol, melatonin, polyethylene glycol, prochlorperazine  Allergies:   No Known Allergies  Social History:   Social History   Socioeconomic History   Marital status: Widowed    Spouse name: Not on file   Number of children: 1   Years of education: Not on file   Highest education level: Not on file  Occupational History   Occupation: security guard  Tobacco Use   Smoking status: Former    Types: Cigarettes    Quit date: 01/02/2013    Years since quitting: 10.3   Smokeless tobacco: Never   Tobacco comments:    smokes about a week  Vaping Use   Vaping Use: Never used  Substance and Sexual Activity   Alcohol use: Not Currently   Drug use: No   Sexual activity: Not Currently  Other Topics Concern   Not on file  Social History Narrative   Mr Chevrier 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 He reports he is independent with all his care needs and transportation to medical appointments   He is stating he is to either retire or go out on disability soon.    His daughter, Hassel Neth is a SW per pt   Social Determinants of Health   Financial Resource Strain: Low Risk  (04/20/2023)   Overall Financial Resource Strain (CARDIA)    Difficulty of Paying Living Expenses: Not hard at all  Food Insecurity: No Food Insecurity (04/20/2023)   Hunger Vital Sign    Worried About Running Out of Food in the Last Year: Never true    Ran Out of Food in the Last Year: Never true  Transportation Needs: Unknown (04/20/2023)   PRAPARE - Administrator, Civil Service (Medical): No    Lack of Transportation (Non-Medical): Not on file  Physical Activity: Not on file  Stress: Not on file  Social Connections: Unknown (07/18/2019)   Social Connection and Isolation Panel [NHANES]     Frequency of Communication with Friends and Family: Not on file    Frequency of Social Gatherings with Friends and Family: Not on file    Attends Religious Services: Not on file    Active Member of Clubs or Organizations: Not on file    Attends Banker Meetings: Not on file    Marital Status: Widowed  Catering manager Violence: Not on file    Family History:    Family History  Problem Relation Age of Onset   Uterine cancer Sister    Heart attack Mother    Breast cancer Mother    Sleep apnea Brother    Heart attack Maternal Grandfather    Sleep apnea Brother      ROS:  Please see the history of present illness.  General:no colds or fevers, no weight changes Skin:no rashes or ulcers HEENT:no blurred vision, no congestion CV:see HPI PUL:see HPI GI:no diarrhea constipation or melena, no indigestion GU:no hematuria, no dysuria MS:no joint pain, no claudication Neuro:no syncope, no lightheadedness Endo:pre- diabetes--A1c 6.9 , no thyroid disease  All other ROS reviewed and negative.     Physical Exam/Data:   Vitals:   04/19/23 1931 04/20/23 0507 04/20/23 0740 04/20/23 0836  BP: (!) 141/68 115/65 (!) 119/58   Pulse: (!) 51 (!) 50 (!) 53   Resp: 17 18 17    Temp: 98 F (36.7 C) 98 F (36.7 C) 97.7 F (36.5 C)   TempSrc:  Oral   SpO2: (!) 88% 94% 98%   Weight:    (!) 154.7 kg  Height:        Intake/Output Summary (Last 24 hours) at 04/20/2023 1329 Last data filed at 04/20/2023 1100 Gross per 24 hour  Intake 1200 ml  Output 2250 ml  Net -1050 ml      04/20/2023    8:36 AM 04/18/2023    7:35 PM 03/18/2023   10:47 AM  Last 3 Weights  Weight (lbs) 341 lb 1.5 oz 345 lb 331 lb  Weight (kg) 154.72 kg 156.491 kg 150.141 kg     Body mass index is 45 kg/m.  General:  Well nourished, well developed, in no acute distress sitting up in chair HEENT: normal Neck: no JVD sitting up Vascular: No carotid bruits; Distal pulses 2+ bilaterally Cardiac:  normal S1, S2;  RRR; no murmur gallup rub or click Lungs:  clear to auscultation bilaterally, no wheezing, rhonchi or rales though diminished Lt base Abd: soft, nontender, no hepatomegaly  Ext: 1-2+ edema lower ext, more his feet Musculoskeletal:  No deformities, BUE and BLE strength normal and equal Skin: warm and dry  Neuro:  CNs 2-12 intact, no focal abnormalities noted Psych:  Normal affect    Relevant CV Studies: Echo 04/19/23  IMPRESSIONS     1. Left ventricular ejection fraction, by estimation, is 50%. The left  ventricle has mildly decreased function. The left ventricle demonstrates  global hypokinesis. Left ventricular diastolic parameters are consistent  with Grade II diastolic dysfunction  (pseudonormalization).   2. Right ventricular systolic function is mildly reduced. The right  ventricular size is normal. Tricuspid regurgitation signal is inadequate  for assessing PA pressure.   3. Left atrial size was mild to moderately dilated.   4. The mitral valve is abnormal. Mild mitral valve regurgitation (MR not  fully visualized, could be worse). No evidence of mitral stenosis.   5. The aortic valve is tricuspid. Aortic valve regurgitation is not  visualized. No aortic stenosis is present.   6. The inferior vena cava is dilated in size with <50% respiratory  variability, suggesting right atrial pressure of 15 mmHg.   7. Technically difficult study with very poor images.   FINDINGS   Left Ventricle: Left ventricular ejection fraction, by estimation, is  50%. The left ventricle has mildly decreased function. The left ventricle  demonstrates global hypokinesis. Definity contrast agent was given IV to  delineate the left ventricular  endocardial borders. The left ventricular internal cavity size was normal  in size. There is no left ventricular hypertrophy. Left ventricular  diastolic parameters are consistent with Grade II diastolic dysfunction  (pseudonormalization).   Right Ventricle:  The right ventricular size is normal. No increase in  right ventricular wall thickness. Right ventricular systolic function is  mildly reduced. Tricuspid regurgitation signal is inadequate for assessing  PA pressure.   Left Atrium: Left atrial size was mild to moderately dilated.   Right Atrium: Right atrial size was normal in size.   Pericardium: There is no evidence of pericardial effusion.   Mitral Valve: The mitral valve is abnormal. Mild mitral valve  regurgitation. No evidence of mitral valve stenosis.   Tricuspid Valve: The tricuspid valve is normal in structure. Tricuspid  valve regurgitation is trivial.   Aortic Valve: The aortic valve is tricuspid. Aortic valve regurgitation is  not visualized. No aortic stenosis is present. Aortic valve mean gradient  measures 3.0 mmHg. Aortic valve peak gradient measures 6.2 mmHg.  Aortic  valve area, by VTI measures 2.26  cm.   Pulmonic Valve: The pulmonic valve was normal in structure. Pulmonic valve  regurgitation is not visualized.   Aorta: The aortic root is normal in size and structure.   Venous: The inferior vena cava is dilated in size with less than 50%  respiratory variability, suggesting right atrial pressure of 15 mmHg.   IAS/Shunts: No atrial level shunt detected by color flow Doppler.     CT cardiac morphology 01/27/23  IMPRESSION: 1.  Moderate bi atrial enlargement   2.  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  No PFO/ASD   4.  No pericardial effusion   5.  Normal ascending thoracic aorta 3.8 cm   6.  Patent SVG to RCA and LIMA to LAD   7.  Normal PV anatomy see measurements above   TTE 03/14/22 FINDINGS   Left Ventricle: Left ventricular ejection fraction, by estimation, is 55  to 60%. The left ventricle has normal function. The left ventricle has no  regional wall motion abnormalities. Definity contrast agent was given IV  to delineate the left ventricular   endocardial borders. The left ventricular  internal cavity size was normal  in size. There is mild concentric left ventricular hypertrophy. Left  ventricular diastolic parameters are consistent with Grade I diastolic  dysfunction (impaired relaxation).   Right Ventricle: The right ventricular size is normal. No increase in  right ventricular wall thickness. Right ventricular systolic function is  normal.   Left Atrium: Left atrial size was severely dilated.   Right Atrium: Right atrial size was moderately dilated.   Pericardium: There is no evidence of pericardial effusion.   Mitral Valve: The mitral valve is degenerative in appearance. Mild to  moderate mitral valve regurgitation.   Tricuspid Valve: The tricuspid valve is normal in structure. Tricuspid  valve regurgitation is mild.   Aortic Valve: The aortic valve is tricuspid. There is mild calcification  of the aortic valve. There is mild thickening of the aortic valve. Aortic  valve regurgitation is not visualized.   Pulmonic Valve: The pulmonic valve was normal in structure. Pulmonic valve  regurgitation is not visualized.   Aorta: The aortic root is normal in size and structure.   Venous: The inferior vena cava is normal in size with less than 50%  respiratory variability, suggesting right atrial pressure of 8 mmHg.   IAS/Shunts: The atrial septum is grossly normal.    Laboratory Data:  High Sensitivity Troponin:   Recent Labs  Lab 04/18/23 1938 04/19/23 0253  TROPONINIHS 13 17     Chemistry Recent Labs  Lab 04/18/23 1938 04/19/23 0253 04/20/23 0150  NA 137 137 137  K 4.4 4.2 3.9  CL 99 100 97*  CO2 27 28 33*  GLUCOSE 149* 222* 153*  BUN 22 19 18   CREATININE 0.83 0.71 0.77  CALCIUM 8.7* 8.9 9.0  MG  --  1.9  --   GFRNONAA >60 >60 >60  ANIONGAP 11 9 7     No results for input(s): "PROT", "ALBUMIN", "AST", "ALT", "ALKPHOS", "BILITOT" in the last 168 hours. Lipids No results for input(s): "CHOL", "TRIG", "HDL", "LABVLDL", "LDLCALC", "CHOLHDL"  in the last 168 hours.  Hematology Recent Labs  Lab 04/18/23 1938 04/19/23 0253 04/20/23 0150  WBC 8.9 8.9 11.6*  RBC 4.78 5.21 4.71  HGB 12.6* 13.6 12.5*  HCT 41.9 45.1 40.3  MCV 87.7 86.6 85.6  MCH 26.4 26.1 26.5  MCHC 30.1 30.2 31.0  RDW 16.4* 16.2* 16.3*  PLT 224 237 238   Thyroid No results for input(s): "TSH", "FREET4" in the last 168 hours.  BNP Recent Labs  Lab 04/18/23 1938  BNP 355.4*    DDimer No results for input(s): "DDIMER" in the last 168 hours.   Radiology/Studies:  ECHOCARDIOGRAM COMPLETE  Result Date: 04/19/2023    ECHOCARDIOGRAM REPORT   Patient Name:   DALANO LATKA Date of Exam: 04/19/2023 Medical Rec #:  [id number]      Height:       73.0 in Accession #:    [id number]     Weight:       345.0 lb Date of Birth:  08-22-1956      BSA:          2.713 m Patient Age:    66 years       BP:           135/65 mmHg Patient Gender: M              HR:           62 bpm. Exam Location:  Inpatient Procedure: 2D Echo, Color Doppler, Cardiac Doppler and Intracardiac            Opacification Agent Indications:    Aute Diastolic CHF  History:        Patient has prior history of Echocardiogram examinations, most                 recent 03/14/2022. CHF, Arrythmias:Atrial Fibrillation,                 Signs/Symptoms:Shortness of Breath; Risk Factors:Hypertension                 and Diabetes.  Sonographer:    Raeford Razor Sonographer#2:  Milbert Coulter Referring Phys: Dow Adolph, N  Sonographer Comments: Technically difficult study due to poor echo windows. Image acquisition challenging due to patient body habitus. IMPRESSIONS  1. Left ventricular ejection fraction, by estimation, is 50%. The left ventricle has mildly decreased function. The left ventricle demonstrates global hypokinesis. Left ventricular diastolic parameters are consistent with Grade II diastolic dysfunction (pseudonormalization).  2. Right ventricular systolic function is mildly reduced. The right ventricular size is normal.  Tricuspid regurgitation signal is inadequate for assessing PA pressure.  3. Left atrial size was mild to moderately dilated.  4. The mitral valve is abnormal. Mild mitral valve regurgitation (MR not fully visualized, could be worse). No evidence of mitral stenosis.  5. The aortic valve is tricuspid. Aortic valve regurgitation is not visualized. No aortic stenosis is present.  6. The inferior vena cava is dilated in size with <50% respiratory variability, suggesting right atrial pressure of 15 mmHg.  7. Technically difficult study with very poor images. FINDINGS  Left Ventricle: Left ventricular ejection fraction, by estimation, is 50%. The left ventricle has mildly decreased function. The left ventricle demonstrates global hypokinesis. Definity contrast agent was given IV to delineate the left ventricular endocardial borders. The left ventricular internal cavity size was normal in size. There is no left ventricular hypertrophy. Left ventricular diastolic parameters are consistent with Grade II diastolic dysfunction (pseudonormalization). Right Ventricle: The right ventricular size is normal. No increase in right ventricular wall thickness. Right ventricular systolic function is mildly reduced. Tricuspid regurgitation signal is inadequate for assessing PA pressure. Left Atrium: Left atrial size was mild to moderately dilated. Right Atrium: Right atrial size was normal in size. Pericardium: There is no evidence of  pericardial effusion. Mitral Valve: The mitral valve is abnormal. Mild mitral valve regurgitation. No evidence of mitral valve stenosis. Tricuspid Valve: The tricuspid valve is normal in structure. Tricuspid valve regurgitation is trivial. Aortic Valve: The aortic valve is tricuspid. Aortic valve regurgitation is not visualized. No aortic stenosis is present. Aortic valve mean gradient measures 3.0 mmHg. Aortic valve peak gradient measures 6.2 mmHg. Aortic valve area, by VTI measures 2.26 cm. Pulmonic  Valve: The pulmonic valve was normal in structure. Pulmonic valve regurgitation is not visualized. Aorta: The aortic root is normal in size and structure. Venous: The inferior vena cava is dilated in size with less than 50% respiratory variability, suggesting right atrial pressure of 15 mmHg. IAS/Shunts: No atrial level shunt detected by color flow Doppler.  LEFT VENTRICLE PLAX 2D LVIDd:         6.00 cm     Diastology LVIDs:         4.40 cm     LV e' medial:    5.11 cm/s LV PW:         1.30 cm     LV E/e' medial:  26.4 LV IVS:        1.40 cm     LV e' lateral:   6.41 cm/s LVOT diam:     2.20 cm     LV E/e' lateral: 21.1 LV SV:         60 LV SV Index:   22 LVOT Area:     3.80 cm  LV Volumes (MOD) LV vol d, MOD A4C: 95.6 ml LV vol s, MOD A4C: 47.2 ml LV SV MOD A4C:     95.6 ml RIGHT VENTRICLE             IVC RV S prime:     10.90 cm/s  IVC diam: 3.20 cm TAPSE (M-mode): 2.4 cm LEFT ATRIUM            Index LA diam:      5.80 cm  2.14 cm/m LA Vol (A4C): 111.0 ml 40.92 ml/m  AORTIC VALVE AV Area (Vmax):    2.35 cm AV Area (Vmean):   2.26 cm AV Area (VTI):     2.26 cm AV Vmax:           124.00 cm/s AV Vmean:          81.000 cm/s AV VTI:            0.264 m AV Peak Grad:      6.2 mmHg AV Mean Grad:      3.0 mmHg LVOT Vmax:         76.50 cm/s LVOT Vmean:        48.200 cm/s LVOT VTI:          0.157 m LVOT/AV VTI ratio: 0.59  AORTA Ao Root diam: 3.60 cm MITRAL VALVE MV Area (PHT): 4.21 cm     SHUNTS MV Decel Time: 180 msec     Systemic VTI:  0.16 m MR Peak grad: 36.5 mmHg     Systemic Diam: 2.20 cm MR Vmax:      302.00 cm/s MV E velocity: 135.00 cm/s MV A velocity: 48.20 cm/s MV E/A ratio:  2.80 Dalton McleanMD Electronically signed by Wilfred Lacy Signature Date/Time: 04/19/2023/10:47:18 AM    Final    CT Angio Chest PE W/Cm &/Or Wo Cm  Result Date: 04/19/2023 CLINICAL DATA:  Concern for pulmonary embolism. EXAM: CT ANGIOGRAPHY CHEST WITH CONTRAST TECHNIQUE: Multidetector CT imaging of  the chest was performed using  the standard protocol during bolus administration of intravenous contrast. Multiplanar CT image reconstructions and MIPs were obtained to evaluate the vascular anatomy. RADIATION DOSE REDUCTION: This exam was performed according to the departmental dose-optimization program which includes automated exposure control, adjustment of the mA and/or kV according to patient size and/or use of iterative reconstruction technique. CONTRAST:  OMNIPAQUE IOHEXOL 350 MG/ML SOLN COMPARISON:  Chest radiograph dated 04/18/2023. FINDINGS: Evaluation of this exam is limited due to respiratory motion artifact. Cardiovascular: There is mild cardiomegaly. No pericardial effusion. Three-vessel coronary vascular calcification. There is moderate atherosclerotic calcification of the thoracic aorta. No aneurysmal dilatation or dissection. Evaluation of the pulmonary arteries is limited due to respiratory motion. No central pulmonary artery embolus identified the Mediastinum/Nodes: No hilar or mediastinal adenopathy. The esophagus is grossly unremarkable. No mediastinal fluid collection. Lungs/Pleura: Areas of airspace opacity involving right middle lobe, bilateral lower lobes, and to a lesser degree right upper lobe most consistent with multi lobar pneumonia. Clinical correlation and follow-up to resolution recommended. No pleural effusion or pneumothorax. The central airways are patent. Upper Abdomen: No acute abnormality. Musculoskeletal: Degenerative changes of the spine. Median sternotomy wires. No acute osseous pathology. Review of the MIP images confirms the above findings. IMPRESSION: 1. No CT evidence of central pulmonary artery embolus. 2. Multi lobar pneumonia. Clinical correlation and follow-up to resolution recommended. 3. Mild cardiomegaly with three-vessel coronary vascular calcification. 4.  Aortic Atherosclerosis (ICD10-I70.0). Electronically Signed   By: Elgie Collard M.D.   On: 04/19/2023 00:57   DG Chest 2  View  Result Date: 04/18/2023 CLINICAL DATA:  SOB EXAM: CHEST - 2 VIEW COMPARISON:  May 18, 2022 FINDINGS: The cardiomediastinal silhouette is unchanged in contour.Status post median sternotomy and CABG. No pleural effusion. No pneumothorax. New RIGHT lateral basilar airspace opacity. LEFT basilar platelike opacities, likely atelectasis. Visualized abdomen is unremarkable. Multilevel degenerative changes of the thoracic spine. IMPRESSION: New RIGHT lateral basilar airspace opacity. Differential considerations include infection, aspiration or other inflammatory process. Follow-up PA and lateral chest radiograph 4-6 weeks after appropriate treatment would be recommended to exclude underlying neoplasm Electronically Signed   By: Meda Klinefelter M.D.   On: 04/18/2023 20:18     Assessment and Plan:   Acute on chronic diastolic HF with hx of EF 20% in 2016.  His EF had improved, now with EF  50% with new mildly decreased function, and global hypokinesis of LV,  + G2DD.  EV is mildly reduced. LA mild to mod dilated.  BNP 355  in combination with PNA.  Wt was up 20 lbs, now diuresing on lasix 60 mg BID.  Continue lasix, coreg has been decreased from 12.5 to 3.125 with acute HF and bradycardia --on losartan 12.5 and spironolactone 12.5  he is feeling better but not putting out as much urine when in hospital previously--wt still 340 lbs and dry wt 01/2023 was 320.7 lbs check TSH Multi lobar PNA per IM on ABX   Hx atrial fib with a fib/flutter ablation in 01/2023,  maintaining SR to SB, here HR down to 40s and amiodarone decreased to 100 mg --Eliquis on hold for hemoptysis resume tomorrow CAD with CABG 03/2022 LIMA to LAD and VG to PLA neg troponins and no acute EKG changes, no angina COPD and OSA unable to use CPAP, on home 02 at night, here weaning to night use only. Per IM  HLD on lipitor 80 mg daily.  Pre-diabetes with A1c now 6.9 per IM  Risk Assessment/Risk Scores:        New York Heart Association  (NYHA) Functional Class NYHA Class IV  CHA2DS2-VASc Score = 4   This indicates a 4.8% annual risk of stroke. The patient's score is based upon: CHF History: 1 HTN History: 1 Diabetes History: 0 (pre diabetes) Stroke History: 0 Vascular Disease History: 1 Age Score: 1 Gender Score: 0         For questions or updates, please contact [facility] Please consult [email or web address] for contact info under    Signed, Nada Boozer, NP  04/20/2023 1:29 PM

## 2023-04-20 NOTE — Progress Notes (Signed)
PROGRESS NOTE    Arthur Church  WUJ:[id number] DOB: December 05, 1956 DOA: 04/18/2023 PCP: Virgilio Belling, PA-C    Brief Narrative:   Arthur Church is a 67 y.o. male with medical history significant for chronic HFpEF 55 to 60%, paroxysmal atrial fibrillation on Eliquis, prediabetes, coronary artery disease, severe morbid obesity, OSA (not on CPAP for more than a year and declines use), chronic nocturnal hypoxia on 2 L nasal cannula nightly, COPD with chronic bronchitis and emphysema, nonischemic cardiomyopathy, who presented to [facility] ED from home due to progressively worsening shortness of breath with onset on Tuesday, 04/13/2023.  Associated with a productive cough with streaks of blood in it.  Endorses lower extremity edema bilaterally.  States he has been checking his oxygen saturation at home and it has been running in the 70s with activities.  Compliant with home Eliquis, has been taking his home p.o. Lasix 80 mg daily as prescribed.  Due to worsening dyspnea he doubled his dose of home diuretics for the past 2 days with minimal improvement.  Presented to the ED for further evaluation.   In the ED, afebrile with no leukocytosis.  O2 saturation improved on 2 L to 93%.  Chest x-ray revealed new right lateral basilar airspace opacity, subsequently had a CT angio chest PE with and without contrast which revealed no CT evidence of central pulmonary artery embolus.  It showed multilobar pneumonia and pulmonary edema.  Mild cardiomegaly with three-vessel coronary vascular calcification.  Aortic atherosclerosis.   The patient was started on IV antibiotics empirically for community-acquired pneumonia, Rocephin and azithromycin.  TRH, hospitalist service, was asked to admit.  IV diuretics added due to concern for acute on chronic diastolic CHF.    Assessment and Plan:  Acute on chronic diastolic CHF Presented with elevated BNP greater than 300, suspected pulmonary edema on CT scan, bilateral lower extremity  edema, left JVD. Last 2D echo done on 03/14/2022 revealed LVEF 55 to 60% and grade 1 diastolic dysfunction-- now with grade 2 diastolic dsfxn IV diuretics, closely monitor electrolytes and renal function while on IV diuretics. Strict I's and O's and daily weight -cards consult -added spiro and losartan   ? Multifocal community-acquired pneumonia, seen on CT scan, POA -partially treated with abx as an outpatient recently -will need follow up x ray to ensure resolution -check strep pna -add Po abx -trend pro calcitonin    Hemoptysis  -asked for patient to produce sample -held eliquis for 24 hours -CBC daily  Acute on chronic hypoxic respiratory failure secondary to multifocal community-acquired pneumonia and pulmonary edema At baseline on 2-3 L oxygen nightly Currently on 2 L nasal cannula continuously to maintain O2 saturation greater than 90% Wean off O2 supplementation as tolerated Incentive spirometer Bronchodilators as needed Early mobilization as tolerated.   Paroxysmal A-fib on Eliquis Currently rate controlled Resume home amiodarone Held Eliquis for today Monitor on telemetry   Coronary artery disease status post CABG Resume home aspirin, Coreg and Lipitor   Chronic anxiety Resume home Xanax as needed   Hyperlipidemia Resume home Lipitor   History of emphysema, chronic bronchitis Resume home regimen   GERD Resume home PPI   Severe morbid obesity with BMI of 45 Recommend weight loss outpatient with regular physical activity and healthy dieting. Estimated body mass index is 45 kg/m as calculated from the following:   Height as of this encounter: 6\' 1"  (1.854 m).   Weight as of this encounter: 154.7 kg.    OSA Not on  CPAP for more than a year and declines the use   Physical debility PT/OT assessment Fall precautions.  ? Alcohol use -answer varies in chart -continue education regarding absolute need for cessation    DVT prophylaxis:  apixaban  (ELIQUIS) tablet 5 mg    Code Status: Full Code Family Communication: spoke with daughter/son  in law-- please update with d/c planning  Disposition Plan:  Level of care: Telemetry Medical Status is: Inpatient Remains inpatient appropriate because: needs IV diuretics    Consultants:  cards   Subjective: Thinks swelling in legs is better  Objective: Vitals:   04/19/23 1931 04/20/23 0507 04/20/23 0740 04/20/23 0836  BP: (!) 141/68 115/65 (!) 119/58   Pulse: (!) 51 (!) 50 (!) 53   Resp: 17 18 17    Temp: 98 F (36.7 C) 98 F (36.7 C) 97.7 F (36.5 C)   TempSrc:   Oral   SpO2: (!) 88% 94% 98%   Weight:    (!) 154.7 kg  Height:        Intake/Output Summary (Last 24 hours) at 04/20/2023 1204 Last data filed at 04/20/2023 1100 Gross per 24 hour  Intake 1200 ml  Output 2250 ml  Net -1050 ml   Filed Weights   04/18/23 1935 04/20/23 0836  Weight: (!) 156.5 kg (!) 154.7 kg    Examination:   General: Appearance:    Severely obese male in no acute distress     Lungs:     Diminished, on [place] respirations unlabored  Heart:    Bradycardic.    MS:   All extremities are intact.    Neurologic:   Awake, alert, oriented x 3. No apparent focal neurological           defect.        Data Reviewed: I have personally reviewed following labs and imaging studies  CBC: Recent Labs  Lab 04/18/23 1938 04/19/23 0253 04/20/23 0150  WBC 8.9 8.9 11.6*  NEUTROABS 5.5  --   --   HGB 12.6* 13.6 12.5*  HCT 41.9 45.1 40.3  MCV 87.7 86.6 85.6  PLT 224 237 238   Basic Metabolic Panel: Recent Labs  Lab 04/18/23 1938 04/19/23 0253 04/20/23 0150  NA 137 137 137  K 4.4 4.2 3.9  CL 99 100 97*  CO2 27 28 33*  GLUCOSE 149* 222* 153*  BUN 22 19 18   CREATININE 0.83 0.71 0.77  CALCIUM 8.7* 8.9 9.0  MG  --  1.9  --   PHOS  --  4.2  --    GFR: Estimated Creatinine Clearance: 143 mL/min (by C-G formula based on SCr of 0.77 mg/dL). Liver Function Tests: No results for input(s): "AST",  "ALT", "ALKPHOS", "BILITOT", "PROT", "ALBUMIN" in the last 168 hours. No results for input(s): "LIPASE", "AMYLASE" in the last 168 hours. No results for input(s): "AMMONIA" in the last 168 hours. Coagulation Profile: No results for input(s): "INR", "PROTIME" in the last 168 hours. Cardiac Enzymes: No results for input(s): "CKTOTAL", "CKMB", "CKMBINDEX", "TROPONINI" in the last 168 hours. BNP (last 3 results) No results for input(s): "PROBNP" in the last 8760 hours. HbA1C: Recent Labs    04/19/23 0248  HGBA1C 6.9*   CBG: Recent Labs  Lab 04/19/23 1337 04/19/23 1607 04/19/23 1932 04/20/23 0741 04/20/23 1118  GLUCAP 206* 160* 106* 117* 102*   Lipid Profile: No results for input(s): "CHOL", "HDL", "LDLCALC", "TRIG", "CHOLHDL", "LDLDIRECT" in the last 72 hours. Thyroid Function Tests: No results for input(s): "TSH", "  T4TOTAL", "FREET4", "T3FREE", "THYROIDAB" in the last 72 hours. Anemia Panel: No results for input(s): "VITAMINB12", "FOLATE", "FERRITIN", "TIBC", "IRON", "RETICCTPCT" in the last 72 hours. Sepsis Labs: Recent Labs  Lab 04/19/23 0253  PROCALCITON <0.10    No results found for this or any previous visit (from the past 240 hour(s)).       Radiology Studies: ECHOCARDIOGRAM COMPLETE  Result Date: 04/19/2023    ECHOCARDIOGRAM REPORT   Patient Name:   Arthur Church Date of Exam: 04/19/2023 Medical Rec #:  [id number]      Height:       73.0 in Accession #:    [id number]     Weight:       345.0 lb Date of Birth:  August 14, 1956      BSA:          2.713 m Patient Age:    66 years       BP:           135/65 mmHg Patient Gender: M              HR:           62 bpm. Exam Location:  Inpatient Procedure: 2D Echo, Color Doppler, Cardiac Doppler and Intracardiac            Opacification Agent Indications:    Aute Diastolic CHF  History:        Patient has prior history of Echocardiogram examinations, most                 recent 03/14/2022. CHF, Arrythmias:Atrial Fibrillation,                  Signs/Symptoms:Shortness of Breath; Risk Factors:Hypertension                 and Diabetes.  Sonographer:    Raeford Razor Sonographer#2:  Milbert Coulter Referring Phys: Dow Adolph, N  Sonographer Comments: Technically difficult study due to poor echo windows. Image acquisition challenging due to patient body habitus. IMPRESSIONS  1. Left ventricular ejection fraction, by estimation, is 50%. The left ventricle has mildly decreased function. The left ventricle demonstrates global hypokinesis. Left ventricular diastolic parameters are consistent with Grade II diastolic dysfunction (pseudonormalization).  2. Right ventricular systolic function is mildly reduced. The right ventricular size is normal. Tricuspid regurgitation signal is inadequate for assessing PA pressure.  3. Left atrial size was mild to moderately dilated.  4. The mitral valve is abnormal. Mild mitral valve regurgitation (MR not fully visualized, could be worse). No evidence of mitral stenosis.  5. The aortic valve is tricuspid. Aortic valve regurgitation is not visualized. No aortic stenosis is present.  6. The inferior vena cava is dilated in size with <50% respiratory variability, suggesting right atrial pressure of 15 mmHg.  7. Technically difficult study with very poor images. FINDINGS  Left Ventricle: Left ventricular ejection fraction, by estimation, is 50%. The left ventricle has mildly decreased function. The left ventricle demonstrates global hypokinesis. Definity contrast agent was given IV to delineate the left ventricular endocardial borders. The left ventricular internal cavity size was normal in size. There is no left ventricular hypertrophy. Left ventricular diastolic parameters are consistent with Grade II diastolic dysfunction (pseudonormalization). Right Ventricle: The right ventricular size is normal. No increase in right ventricular wall thickness. Right ventricular systolic function is mildly reduced. Tricuspid regurgitation  signal is inadequate for assessing PA pressure. Left Atrium: Left atrial size was mild to moderately dilated. Right Atrium: Right atrial  size was normal in size. Pericardium: There is no evidence of pericardial effusion. Mitral Valve: The mitral valve is abnormal. Mild mitral valve regurgitation. No evidence of mitral valve stenosis. Tricuspid Valve: The tricuspid valve is normal in structure. Tricuspid valve regurgitation is trivial. Aortic Valve: The aortic valve is tricuspid. Aortic valve regurgitation is not visualized. No aortic stenosis is present. Aortic valve mean gradient measures 3.0 mmHg. Aortic valve peak gradient measures 6.2 mmHg. Aortic valve area, by VTI measures 2.26 cm. Pulmonic Valve: The pulmonic valve was normal in structure. Pulmonic valve regurgitation is not visualized. Aorta: The aortic root is normal in size and structure. Venous: The inferior vena cava is dilated in size with less than 50% respiratory variability, suggesting right atrial pressure of 15 mmHg. IAS/Shunts: No atrial level shunt detected by color flow Doppler.  LEFT VENTRICLE PLAX 2D LVIDd:         6.00 cm     Diastology LVIDs:         4.40 cm     LV e' medial:    5.11 cm/s LV PW:         1.30 cm     LV E/e' medial:  26.4 LV IVS:        1.40 cm     LV e' lateral:   6.41 cm/s LVOT diam:     2.20 cm     LV E/e' lateral: 21.1 LV SV:         60 LV SV Index:   22 LVOT Area:     3.80 cm  LV Volumes (MOD) LV vol d, MOD A4C: 95.6 ml LV vol s, MOD A4C: 47.2 ml LV SV MOD A4C:     95.6 ml RIGHT VENTRICLE             IVC RV S prime:     10.90 cm/s  IVC diam: 3.20 cm TAPSE (M-mode): 2.4 cm LEFT ATRIUM            Index LA diam:      5.80 cm  2.14 cm/m LA Vol (A4C): 111.0 ml 40.92 ml/m  AORTIC VALVE AV Area (Vmax):    2.35 cm AV Area (Vmean):   2.26 cm AV Area (VTI):     2.26 cm AV Vmax:           124.00 cm/s AV Vmean:          81.000 cm/s AV VTI:            0.264 m AV Peak Grad:      6.2 mmHg AV Mean Grad:      3.0 mmHg LVOT Vmax:          76.50 cm/s LVOT Vmean:        48.200 cm/s LVOT VTI:          0.157 m LVOT/AV VTI ratio: 0.59  AORTA Ao Root diam: 3.60 cm MITRAL VALVE MV Area (PHT): 4.21 cm     SHUNTS MV Decel Time: 180 msec     Systemic VTI:  0.16 m MR Peak grad: 36.5 mmHg     Systemic Diam: 2.20 cm MR Vmax:      302.00 cm/s MV E velocity: 135.00 cm/s MV A velocity: 48.20 cm/s MV E/A ratio:  2.80 Dalton McleanMD Electronically signed by Wilfred Lacy Signature Date/Time: 04/19/2023/10:47:18 AM    Final    CT Angio Chest PE W/Cm &/Or Wo Cm  Result Date: 04/19/2023 CLINICAL DATA:  Concern for pulmonary embolism.  EXAM: CT ANGIOGRAPHY CHEST WITH CONTRAST TECHNIQUE: Multidetector CT imaging of the chest was performed using the standard protocol during bolus administration of intravenous contrast. Multiplanar CT image reconstructions and MIPs were obtained to evaluate the vascular anatomy. RADIATION DOSE REDUCTION: This exam was performed according to the departmental dose-optimization program which includes automated exposure control, adjustment of the mA and/or kV according to patient size and/or use of iterative reconstruction technique. CONTRAST:  OMNIPAQUE IOHEXOL 350 MG/ML SOLN COMPARISON:  Chest radiograph dated 04/18/2023. FINDINGS: Evaluation of this exam is limited due to respiratory motion artifact. Cardiovascular: There is mild cardiomegaly. No pericardial effusion. Three-vessel coronary vascular calcification. There is moderate atherosclerotic calcification of the thoracic aorta. No aneurysmal dilatation or dissection. Evaluation of the pulmonary arteries is limited due to respiratory motion. No central pulmonary artery embolus identified the Mediastinum/Nodes: No hilar or mediastinal adenopathy. The esophagus is grossly unremarkable. No mediastinal fluid collection. Lungs/Pleura: Areas of airspace opacity involving right middle lobe, bilateral lower lobes, and to a lesser degree right upper lobe most consistent with multi  lobar pneumonia. Clinical correlation and follow-up to resolution recommended. No pleural effusion or pneumothorax. The central airways are patent. Upper Abdomen: No acute abnormality. Musculoskeletal: Degenerative changes of the spine. Median sternotomy wires. No acute osseous pathology. Review of the MIP images confirms the above findings. IMPRESSION: 1. No CT evidence of central pulmonary artery embolus. 2. Multi lobar pneumonia. Clinical correlation and follow-up to resolution recommended. 3. Mild cardiomegaly with three-vessel coronary vascular calcification. 4.  Aortic Atherosclerosis (ICD10-I70.0). Electronically Signed   By: Elgie Collard M.D.   On: 04/19/2023 00:57   DG Chest 2 View  Result Date: 04/18/2023 CLINICAL DATA:  SOB EXAM: CHEST - 2 VIEW COMPARISON:  May 18, 2022 FINDINGS: The cardiomediastinal silhouette is unchanged in contour.Status post median sternotomy and CABG. No pleural effusion. No pneumothorax. New RIGHT lateral basilar airspace opacity. LEFT basilar platelike opacities, likely atelectasis. Visualized abdomen is unremarkable. Multilevel degenerative changes of the thoracic spine. IMPRESSION: New RIGHT lateral basilar airspace opacity. Differential considerations include infection, aspiration or other inflammatory process. Follow-up PA and lateral chest radiograph 4-6 weeks after appropriate treatment would be recommended to exclude underlying neoplasm Electronically Signed   By: Meda Klinefelter M.D.   On: 04/18/2023 20:18        Scheduled Meds:  amiodarone  100 mg Oral Daily   amoxicillin-clavulanate  1 tablet Oral Q12H   [START ON 04/21/2023] apixaban  5 mg Oral BID   aspirin EC  81 mg Oral Daily   atorvastatin  80 mg Oral Daily   carvedilol  3.125 mg Oral BID WC   fluticasone furoate-vilanterol  1 puff Inhalation Daily   And   umeclidinium bromide  1 puff Inhalation Daily   furosemide  60 mg Intravenous BID   insulin aspart  0-15 Units Subcutaneous TID WC    insulin aspart  0-5 Units Subcutaneous QHS   losartan  12.5 mg Oral Daily   montelukast  10 mg Oral QHS   pantoprazole  40 mg Oral Daily   spironolactone  12.5 mg Oral Daily   Continuous Infusions:   LOS: 1 day    Time spent: 45 minutes spent on chart review, discussion with nursing staff, consultants, updating family and interview/physical exam; more than 50% of that time was spent in counseling and/or coordination of care.    Joseph Art, DO [facility] Available via Epic secure chat 7am-7pm After these hours, please refer to coverage provider listed on  [email or web address] 04/20/2023, 12:04 PM

## 2023-04-20 NOTE — Progress Notes (Addendum)
   Heart Failure Stewardship Pharmacist Progress Note   PCP: Virgilio Belling, PA-C PCP-Cardiologist: Chrystie Nose, MD    HPI:  67 yo M with a PMH of HTN, CHF, CAD, afib, and prediabetes.   Presented to the ED on 5/5 with shortness of breath, 20 lb weight gain, and LE edema despite taking additional lasix at home.  CXR with new right lateral basilar airspace opacity. CTA negative for PE, also with mild cardiomegaly and multi lobar PNA. ECHO 5/6 showed LVEF 50% (was 45-50% during CABG in 2023), global hypokinesis, G2DD, and RV mildly reduced.   Current HF Medications: Diuretic: furosemide 60 mg IV BID Beta Blocker: carvedilol 3.125 mg BID  Prior to admission HF Medications: Diuretic: furosemide 40 mg daily Beta blocker: carvedilol 12.5 mg BID  Pertinent Lab Values: Serum creatinine 0.77, BUN 18, Potassium 3.9, Sodium 137, BNP 355.4, Magnesium 1.9, A1c 6.9  Vital Signs: Weight: 341 lbs (admission weight: 345 lbs) Blood pressure: 110/60s  Heart rate: 50s  I/O: -1.7L yesterday; net -2.7L  Medication Assistance / Insurance Benefits Check: Does the patient have prescription insurance?  Yes Type of insurance plan: [employer]  Outpatient Pharmacy:  Prior to admission outpatient pharmacy: [facility] Is the patient willing to use [facility] TOC pharmacy at discharge? Yes Is the patient willing to transition their outpatient pharmacy to utilize a [facility] outpatient pharmacy?   Pending    Assessment: 1. Acute on chronic systolic and diastolic CHF (LVEF 50%), due to NICM. NYHA class III symptoms. - Agree with increasing to furosemide 60 mg IV BID. Strict I/Os and daily weights. Keep K>4 and Mg>2. - Continue carvedilol 3.125 mg BID - Consider starting losartan 12.5 mg daily and spironolactone 12.5 mg daily today - Can escalate to Entresto pending BP and K. Has been taken off Entresto in the past due to hyperkalemia.  - Consider SGLT2i prior to discharge   Plan: 1) Medication  changes recommended at this time: - Add spironolactone 12.5 mg daily and losartan 12.5 mg daily  2) Patient assistance: - Entresto copay $11.20 - Farxiga/Jardiance copay $11.20  3)  Education  - To be completed prior to discharge  Sharen Hones, PharmD, BCPS Heart Failure Stewardship Pharmacist Phone [phone]

## 2023-04-20 NOTE — Progress Notes (Signed)
Mobility Specialist Progress Note   04/20/23 1450  Mobility  Activity Ambulated with assistance in hallway;Ambulated independently to bathroom  Level of Assistance Modified independent, requires aide device or extra time  Assistive Device None  Distance Ambulated (ft) 300 ft  Range of Motion/Exercises Active;All extremities  Activity Response Tolerated well   Pre Mobility:  HR 60, SpO2 93% 2LO2 During Mobility: HR 57-65, SpO2 80-88% 2LO2 Post Mobility: HR 63, SpO2 90% 2LO2  Patient received in recliner and agreeable to participate. Ambulated to bathroom and in hallway with slow steady gait. Oxygen saturation fluctuated low-mid 80's on 2LO2 and HR dropped as low as 57 bpm. Despite presenting with hypoxia and dyspnea with exertion, patient denied dizziness or lightheadedness. Returned to room without complaint or incident. Needed approximately 2 minutes with seated rest for SpO2 to recover >90%. Was left in recliner with all needs met, call bell in reach.   Arthur Church, BS EXP Mobility Specialist Please contact via SecureChat or Rehab office at [phone]

## 2023-04-21 DIAGNOSIS — I5022 Chronic systolic (congestive) heart failure: Secondary | ICD-10-CM | POA: Diagnosis not present

## 2023-04-21 DIAGNOSIS — J189 Pneumonia, unspecified organism: Secondary | ICD-10-CM | POA: Diagnosis not present

## 2023-04-21 DIAGNOSIS — I5033 Acute on chronic diastolic (congestive) heart failure: Secondary | ICD-10-CM | POA: Diagnosis not present

## 2023-04-21 LAB — GLUCOSE, CAPILLARY
Glucose-Capillary: 113 mg/dL — ABNORMAL HIGH (ref 70–99)
Glucose-Capillary: 124 mg/dL — ABNORMAL HIGH (ref 70–99)
Glucose-Capillary: 86 mg/dL (ref 70–99)
Glucose-Capillary: 197 mg/dL — ABNORMAL HIGH (ref 70–99)

## 2023-04-21 LAB — CBC
HCT: 41 % (ref 39.0–52.0)
Hemoglobin: 13 g/dL (ref 13.0–17.0)
MCH: 26.8 pg (ref 26.0–34.0)
MCHC: 31.7 g/dL (ref 30.0–36.0)
MCV: 84.5 fL (ref 80.0–100.0)
Platelets: 241 10*3/uL (ref 150–400)
RBC: 4.85 MIL/uL (ref 4.22–5.81)
RDW: 16.2 % — ABNORMAL HIGH (ref 11.5–15.5)
WBC: 9 10*3/uL (ref 4.0–10.5)
nRBC: 0 % (ref 0.0–0.2)

## 2023-04-21 LAB — BASIC METABOLIC PANEL
Anion gap: 9 (ref 5–15)
BUN: 21 mg/dL (ref 8–23)
CO2: 31 mmol/L (ref 22–32)
Calcium: 9 mg/dL (ref 8.9–10.3)
Chloride: 97 mmol/L — ABNORMAL LOW (ref 98–111)
Creatinine, Ser: 0.9 mg/dL (ref 0.61–1.24)
GFR, Estimated: >60 mL/min (ref >60)
Glucose, Bld: 113 mg/dL — ABNORMAL HIGH (ref 70–99)
Potassium: 3.8 mmol/L (ref 3.5–5.1)
Sodium: 137 mmol/L (ref 135–145)

## 2023-04-21 LAB — PROCALCITONIN: Procalcitonin: <0.1 ng/mL

## 2023-04-21 NOTE — Hospital Course (Addendum)
Arthur Church is a 67 y.o. male with a history of heart failure with preserved EF, paroxysmal atrial fibrillation, prediabetes, CAD, morbid obesity, OSA, chronic nocturnal hypoxia, COPD, nonischemic cardiomyopathy.  Patient presented secondary to progressively worsening shortness of breath with productive cough and found to have associated hypoxia.  Concern for acute heart failure on admission and patient was started on IV Lasix diuresis.  Patient also found to have likely pneumonia was started on empiric antibiotics.  Cardiology was consulted and prescribed aggressive Lasix IV diuresis.  Patient's weight on admission was about 154.7 kg down to 147.9 kg after diuresis.  Patient's home Lasix dose increased to 80 mg daily and patient started on spironolactone and losartan.  Patient to follow-up with cardiology as an outpatient.

## 2023-04-21 NOTE — Progress Notes (Signed)
PROGRESS NOTE    Arthur Church  WUJ:[id number] DOB: 1956-04-19 DOA: 04/18/2023 PCP: Arthur Belling, PA-C   Brief Narrative: Arthur Church is a 67 y.o. male with a history of heart failure with preserved EF, paroxysmal atrial fibrillation, prediabetes, CAD, morbid obesity, OSA, chronic nocturnal hypoxia, COPD, nonischemic cardiomyopathy.  Patient presented secondary to progressively worsening shortness of breath with productive cough and found to have associated hypoxia.  Concern for acute heart failure on admission and patient was started on IV Lasix diuresis.  Patient also found to have likely pneumonia will start empiric antibiotics.   Assessment and Plan:  Acute on chronic diastolic heart failure Present on admission. Associated dyspnea and hypoxia. Per patient, likely dry weight is about 320 lbs. Weight on admission of 341.1 lbs, down to 336.86 lbs with IV diuresis. Transthoracic Echocardiogram significant for preserved EF with grade 2 diastolic dysfunction. -Cardiology recommendations: IV Lasix diuresis  Community acquired pneumonia Noted prior to admission with imaging this admission confirming persistent evidence of infection. Patient started on empiric ceftriaxone and azithromycin and was transition to Augmentin for treatment. Procalcitonin negative. -Continue Augmentin  Hemoptysis Episode prior to admission. No recurrent episodes. Hemoglobin stable.  Acute on chronic respiratory failure with hypoxia Patient uses 2-3 L/min of oxygen at night but is now requiring oxygen during the day secondary to acute heart failure. -Wean oxygen to room air as able.  Paroxysmal atrial fibrillation Patient is s/p ablation and currently in sinus rhythm. Patient is  also on Eliquis. -Continue Coreg and Eliquis  CAD S/p CABG. No current chest pain. -Continue Coreg, aspirin and Lipitor  Anxiety -Continue Xanax PRN  Hyperlipidemia -Continue Lipitor  Emphysema -Continue Breo  Ellipta  GERD -Continue Protonix  OSA Not on CPAP.  Morbid obesity Estimated body mass index is 44.44 kg/m as calculated from the following:   Height as of this encounter: 6\' 1"  (1.854 m).   Weight as of this encounter: 152.8 kg.  DVT prophylaxis: Eliquis Code Status:   Code Status: Full Code Family Communication: None Disposition Plan: Discharge home likely in 2-3 days pending ongoing cardiology recommendations for diuresis.   Consultants:  Cardiology  Procedures:  5/6: Transthoracic Echocardiogram   Antimicrobials: None    Subjective: Patient reports no dyspnea or chest pain.  Objective: BP 139/60 (BP Location: Left Arm)   Pulse (!) 50   Temp 98.5 F (36.9 C) (Oral)   Resp 18   Ht 6\' 1"  (1.854 m)   Wt (!) 152.8 kg   SpO2 92%   BMI 44.44 kg/m   Examination:  General exam: Appears calm and comfortable Respiratory system: Left sided rales. Respiratory effort normal. Cardiovascular system: S1 & S2 heard, Decreased rate with regular rhythm. Gastrointestinal system: Abdomen is obese, soft and nontender. Normal bowel sounds heard. Central nervous system: Alert and oriented. No focal neurological deficits. Musculoskeletal: 2+ BLE pitting edema. No calf tenderness Skin: No cyanosis. No rashes Psychiatry: Judgement and insight appear normal. Mood & affect appropriate.    Data Reviewed: I have personally reviewed following labs and imaging studies  CBC Lab Results  Component Value Date   WBC 9.0 04/21/2023   RBC 4.85 04/21/2023   HGB 13.0 04/21/2023   HCT 41.0 04/21/2023   MCV 84.5 04/21/2023   MCH 26.8 04/21/2023   PLT 241 04/21/2023   MCHC 31.7 04/21/2023   RDW 16.2 (H) 04/21/2023   LYMPHSABS 2.3 04/18/2023   MONOABS 0.9 04/18/2023   EOSABS 0.2 04/18/2023   BASOSABS 0.1 04/18/2023  Last metabolic panel Lab Results  Component Value Date   NA 137 04/21/2023   K 3.8 04/21/2023   CL 97 (L) 04/21/2023   CO2 31 04/21/2023   BUN 21 04/21/2023    CREATININE 0.90 04/21/2023   GLUCOSE 113 (H) 04/21/2023   GFRNONAA >60 04/21/2023   GFRAA 118 03/07/2020   CALCIUM 9.0 04/21/2023   PHOS 4.2 04/19/2023   PROT 6.8 05/21/2022   ALBUMIN 4.3 05/21/2022   LABGLOB 2.5 05/21/2022   AGRATIO 1.7 05/21/2022   BILITOT 0.7 05/21/2022   ALKPHOS 78 05/21/2022   AST 15 05/21/2022   ALT 10 05/21/2022   ANIONGAP 9 04/21/2023    GFR: Estimated Creatinine Clearance: 126.3 mL/min (by C-G formula based on SCr of 0.9 mg/dL).  No results found for this or any previous visit (from the past 240 hour(s)).    Radiology Studies: ECHOCARDIOGRAM COMPLETE  Result Date: 04/19/2023    ECHOCARDIOGRAM REPORT   Patient Name:   Arthur Church Date of Exam: 04/19/2023 Medical Rec #:  [id number]      Height:       73.0 in Accession #:    [id number]     Weight:       345.0 lb Date of Birth:  October 02, 1956      BSA:          2.713 m Patient Age:    66 years       BP:           135/65 mmHg Patient Gender: M              HR:           62 bpm. Exam Location:  Inpatient Procedure: 2D Echo, Color Doppler, Cardiac Doppler and Intracardiac            Opacification Agent Indications:    Aute Diastolic CHF  History:        Patient has prior history of Echocardiogram examinations, most                 recent 03/14/2022. CHF, Arrythmias:Atrial Fibrillation,                 Signs/Symptoms:Shortness of Breath; Risk Factors:Hypertension                 and Diabetes.  Sonographer:    Raeford Razor Sonographer#2:  Milbert Coulter Referring Phys: Dow Adolph, N  Sonographer Comments: Technically difficult study due to poor echo windows. Image acquisition challenging due to patient body habitus. IMPRESSIONS  1. Left ventricular ejection fraction, by estimation, is 50%. The left ventricle has mildly decreased function. The left ventricle demonstrates global hypokinesis. Left ventricular diastolic parameters are consistent with Grade II diastolic dysfunction (pseudonormalization).  2. Right ventricular systolic  function is mildly reduced. The right ventricular size is normal. Tricuspid regurgitation signal is inadequate for assessing PA pressure.  3. Left atrial size was mild to moderately dilated.  4. The mitral valve is abnormal. Mild mitral valve regurgitation (MR not fully visualized, could be worse). No evidence of mitral stenosis.  5. The aortic valve is tricuspid. Aortic valve regurgitation is not visualized. No aortic stenosis is present.  6. The inferior vena cava is dilated in size with <50% respiratory variability, suggesting right atrial pressure of 15 mmHg.  7. Technically difficult study with very poor images. FINDINGS  Left Ventricle: Left ventricular ejection fraction, by estimation, is 50%. The left ventricle has mildly decreased function. The left ventricle demonstrates global  hypokinesis. Definity contrast agent was given IV to delineate the left ventricular endocardial borders. The left ventricular internal cavity size was normal in size. There is no left ventricular hypertrophy. Left ventricular diastolic parameters are consistent with Grade II diastolic dysfunction (pseudonormalization). Right Ventricle: The right ventricular size is normal. No increase in right ventricular wall thickness. Right ventricular systolic function is mildly reduced. Tricuspid regurgitation signal is inadequate for assessing PA pressure. Left Atrium: Left atrial size was mild to moderately dilated. Right Atrium: Right atrial size was normal in size. Pericardium: There is no evidence of pericardial effusion. Mitral Valve: The mitral valve is abnormal. Mild mitral valve regurgitation. No evidence of mitral valve stenosis. Tricuspid Valve: The tricuspid valve is normal in structure. Tricuspid valve regurgitation is trivial. Aortic Valve: The aortic valve is tricuspid. Aortic valve regurgitation is not visualized. No aortic stenosis is present. Aortic valve mean gradient measures 3.0 mmHg. Aortic valve peak gradient measures 6.2  mmHg. Aortic valve area, by VTI measures 2.26 cm. Pulmonic Valve: The pulmonic valve was normal in structure. Pulmonic valve regurgitation is not visualized. Aorta: The aortic root is normal in size and structure. Venous: The inferior vena cava is dilated in size with less than 50% respiratory variability, suggesting right atrial pressure of 15 mmHg. IAS/Shunts: No atrial level shunt detected by color flow Doppler.  LEFT VENTRICLE PLAX 2D LVIDd:         6.00 cm     Diastology LVIDs:         4.40 cm     LV e' medial:    5.11 cm/s LV PW:         1.30 cm     LV E/e' medial:  26.4 LV IVS:        1.40 cm     LV e' lateral:   6.41 cm/s LVOT diam:     2.20 cm     LV E/e' lateral: 21.1 LV SV:         60 LV SV Index:   22 LVOT Area:     3.80 cm  LV Volumes (MOD) LV vol d, MOD A4C: 95.6 ml LV vol s, MOD A4C: 47.2 ml LV SV MOD A4C:     95.6 ml RIGHT VENTRICLE             IVC RV S prime:     10.90 cm/s  IVC diam: 3.20 cm TAPSE (M-mode): 2.4 cm LEFT ATRIUM            Index LA diam:      5.80 cm  2.14 cm/m LA Vol (A4C): 111.0 ml 40.92 ml/m  AORTIC VALVE AV Area (Vmax):    2.35 cm AV Area (Vmean):   2.26 cm AV Area (VTI):     2.26 cm AV Vmax:           124.00 cm/s AV Vmean:          81.000 cm/s AV VTI:            0.264 m AV Peak Grad:      6.2 mmHg AV Mean Grad:      3.0 mmHg LVOT Vmax:         76.50 cm/s LVOT Vmean:        48.200 cm/s LVOT VTI:          0.157 m LVOT/AV VTI ratio: 0.59  AORTA Ao Root diam: 3.60 cm MITRAL VALVE MV Area (PHT): 4.21 cm     SHUNTS MV  Decel Time: 180 msec     Systemic VTI:  0.16 m MR Peak grad: 36.5 mmHg     Systemic Diam: 2.20 cm MR Vmax:      302.00 cm/s MV E velocity: 135.00 cm/s MV A velocity: 48.20 cm/s MV E/A ratio:  2.80 Dalton McleanMD Electronically signed by Wilfred Lacy Signature Date/Time: 04/19/2023/10:47:18 AM    Final       LOS: 2 days    Jacquelin Hawking, MD [facility] 04/21/2023, 9:01 AM   If 7PM-7AM, please contact night-coverage [email or web address]

## 2023-04-21 NOTE — Progress Notes (Signed)
Patient complains of tinnitus, primary team informed.

## 2023-04-21 NOTE — Progress Notes (Signed)
Mobility Specialist Progress Note   04/21/23 1611  Mobility  Activity Ambulated independently in room;Ambulated independently to bathroom  Level of Assistance Modified independent, requires aide device or extra time  Assistive Device None  Distance Ambulated (ft) 25 ft  Range of Motion/Exercises Active;All extremities  Activity Response Tolerated well   Pre Ambulation:  HR 55, SpO2 96% 2LO2 During Ambulation: SpO2 88% RA Post Ambulation: HR 60, SpO2 90% 2LO2  Patient received in recliner, deferred hallway ambulation secondary to just returning to room from ambulating independently. Ambulated in room and to bathroom with independently with steady gait. Tolerated without complaint or incident. Was left in recliner with all needs met, call bell in reach.   Arthur Church, BS EXP Mobility Specialist Please contact via SecureChat or Rehab office at [phone]

## 2023-04-21 NOTE — Progress Notes (Signed)
Patient ambulated 50 feet around the unit, with help from [facility].  His O2 saturation dropped as low as 85 with 2L.   Denies any dyspnea, complains of SOB. He was assisted safely back to his room.   Call bell within reach.

## 2023-04-21 NOTE — Progress Notes (Signed)
   Heart Failure Stewardship Pharmacist Progress Note   PCP: Virgilio Belling, PA-C PCP-Cardiologist: Chrystie Nose, MD    HPI:  67 yo M with a PMH of HTN, CHF, CAD, afib, and prediabetes.   Presented to the ED on 5/5 with shortness of breath, 20 lb weight gain, and LE edema despite taking additional lasix at home.  CXR with new right lateral basilar airspace opacity. CTA negative for PE, also with mild cardiomegaly and multi lobar PNA. ECHO 5/6 showed LVEF 50% (was 45-50% during CABG in 2023), global hypokinesis, G2DD, and RV mildly reduced.   Current HF Medications: Diuretic: furosemide 60 mg IV BID Beta Blocker: carvedilol 3.125 mg BID ACE/ARB/ARNI: losartan 12.5 mg daily MRA: spironolactone 12.5 mg daily  Prior to admission HF Medications: Diuretic: furosemide 40 mg daily Beta blocker: carvedilol 12.5 mg BID  Pertinent Lab Values: Serum creatinine 0.90, BUN 21, Potassium 3.8, Sodium 137, BNP 355.4, Magnesium 1.9, A1c 6.9  Vital Signs: Weight: 336 lbs (admission weight: 345 lbs) Blood pressure: 120-130/60s  Heart rate: 50s  I/O: -3.2L yesterday; net -5.5L  Medication Assistance / Insurance Benefits Check: Does the patient have prescription insurance?  Yes Type of insurance plan: [employer]  Outpatient Pharmacy:  Prior to admission outpatient pharmacy: [facility] Is the patient willing to use [facility] pharmacy at discharge? Yes Is the patient willing to transition their outpatient pharmacy to utilize a [facility] outpatient pharmacy?   Pending    Assessment: 1. Acute on chronic systolic and diastolic CHF (LVEF 50%), due to NICM. NYHA class III symptoms. - Continue furosemide 60 mg IV BID. Strict I/Os and daily weights. Keep K>4 and Mg>2. - Continue carvedilol 3.125 mg BID - Continue losartan 12.5 mg daily - Can escalate to Entresto pending BP and K. Has been taken off Entresto in the past due to hyperkalemia.  - Continue spironolactone 12.5 mg daily - Consider  SGLT2i prior to discharge   Plan: 1) Medication changes recommended at this time: - Continue current regimen; monitor K  2) Patient assistance: - Entresto copay $11.20 - Farxiga/Jardiance copay $11.20  3)  Education  - To be completed prior to discharge  Sharen Hones, PharmD, BCPS Heart Failure Stewardship Pharmacist Phone [phone]

## 2023-04-21 NOTE — Discharge Instructions (Addendum)
Heart Healthy, Consistent Carbohydrate Nutrition Therapy   A heart-healthy and consistent carbohydrate diet is recommended to manage heart disease and diabetes. To follow a heart-healthy and consistent carbohydrate diet, Eat a balanced diet with whole grains, fruits and vegetables, and lean protein sources.  Choose heart-healthy unsaturated fats. Limit saturated fats, trans fats, and cholesterol intake. Eat more plant-based or vegetarian meals using beans and soy foods for protein.  Eat whole, unprocessed foods to limit the amount of sodium (salt) you eat.  Choose a consistent amount of carbohydrate at each meal and snack. Limit refined carbohydrates especially sugar, sweets and sugar-sweetened beverages.  If you drink alcohol, do so in moderation: one serving per day (women) and two servings per day (men). o One serving is equivalent to 12 ounces beer, 5 ounces wine, or 1.5 ounces distilled spirits  Tips Tips for Choosing Heart-Healthy Fats Choose lean protein and low-fat dairy foods to reduce saturated fat intake. Saturated fat is usually found in animal-based protein and is associated with certain health risks. Saturated fat is the biggest contributor to raise low-density lipoprotein (LDL) cholesterol levels. Research shows that limiting saturated fat lowers unhealthy cholesterol levels. Eat no more than 7% of your total calories each day from saturated fat. Ask your RDN to help you determine how much saturated fat is right for you. There are many foods that do not contain large amounts of saturated fats. Swapping these foods to replace foods high in saturated fats will help you limit the saturated fat you eat and improve your cholesterol levels. You can also try eating more plant-based or vegetarian meals. Instead of. Try:  Whole milk, cheese, yogurt, and ice cream 1% or skim milk, low-fat cheese, non-fat yogurt, and low-fat ice cream  Fatty, marbled beef and pork Lean beef, pork, or venison   Poultry with skin Poultry without skin  Butter, stick margarine Reduced-fat, whipped, or liquid spreads  Coconut oil, palm oil Liquid vegetable oils: corn, canola, olive, soybean and safflower oils   Avoid foods that contain trans fats. Trans fats increase levels of LDL-cholesterol. Hydrogenated fat in processed foods is the main source of trans fats in foods.  Trans fats can be found in stick margarine, shortening, processed sweets, baked goods, some fried foods, and packaged foods made with hydrogenated oils. Avoid foods with "partially hydrogenated oil" on the ingredient list such as: cookies, pastries, baked goods, biscuits, crackers, microwave popcorn, and frozen dinners.  Choose foods with heart healthy fats. Polyunsaturated and monounsaturated fat are unsaturated fats that may help lower your blood cholesterol level when used in place of saturated fat in your diet. Ask your RDN about taking a dietary supplement with plant sterols and stanols to help lower your cholesterol level. Research shows that substituting saturated fats with unsaturated fats is beneficial to cholesterol levels. Try these easy swaps: Instead of. Try:  Butter, stick margarine, or solid shortening Reduced-fat, whipped, or liquid spreads  Beef, pork, or poultry with skin Fish and seafood  Chips, crackers, snack foods Raw or unsalted nuts and seeds or nut butters Hummus with vegetables Avocado on toast  Coconut oil, palm oil Liquid vegetable oils: corn, canola, olive, soybean and safflower oils   Limit the amount of cholesterol you eat to less than 200 milligrams per day. Cholesterol is a substance carried through the bloodstream via lipoproteins, which are known as "transporters" of fat. Some body functions need cholesterol to work properly, but too much cholesterol in the bloodstream can damage arteries and build up blood  vessel linings (which can lead to heart attack and stroke). You should eat less than 200  milligrams cholesterol per day. People respond differently to eating cholesterol. There is no test available right now that can figure out which people will respond more to dietary cholesterol and which will respond less. For individuals with high intake of dietary cholesterol, different types of increase (none, small, moderate, large) in LDL-cholesterol levels are all possible.  Food sources of cholesterol include egg yolks and organ meats such as liver, gizzards. Limit egg yolks to two to four per week and avoid organ meats like liver and gizzards to control cholesterol intake.  Tips for Choosing Heart-Healthy Carbohydrates Consume a consistent amount of carbohydrate It is important to eat foods with carbohydrates in moderation because they impact your blood glucose level. Carbohydrates can be found in many foods such as: Grains (breads, crackers, rice, pasta, and cereals)  Starchy Vegetables (potatoes, corn, and peas)  Beans and legumes  Milk, soy milk, and yogurt  Fruit and fruit juice  Sweets (cakes, cookies, ice cream, jam and jelly) Your RDN will help you set a goal for how many carbohydrate servings to eat at your meals and snacks. For many adults, eating 3 to 5 servings of carbohydrate foods at each meal and 1 or 2 carbohydrate servings for each snack works well.  Check your blood glucose level regularly. It can tell you if you need to adjust when you eat carbohydrates.  Choose foods rich in viscous (soluble) fiber Viscous, or soluble, is found in the walls of plant cells. Viscous fiber is found only in plant-based foods. Eating foods with fiber helps to lower your unhealthy cholesterol and keep your blood glucose in range  Rich sources of viscous fiber include vegetables (asparagus, Brussels sprouts, sweet potatoes, turnips) fruit (apricots, mangoes, oranges), legumes, and whole grains (barley, oats, and oat bran).  As you increase your fiber intake gradually, also increase the amount of  water you drink. This will help prevent constipation.  If you have difficulty achieving this goal, ask your RDN about fiber laxatives. Choose fiber supplements made with viscous fibers such as psyllium seed husks or methylcellulose to help lower unhealthy cholesterol.   Limit refined carbohydrates  There are three types of carbohydrates: starches, sugar, and fiber. Some carbohydrates occur naturally in food, like the starches in rice or corn or the sugars in fruits and milk. Refined carbohydrates--foods with high amounts of simple sugars--can raise triglyceride levels. High triglyceride levels are associated with coronary heart disease. Some examples of refined carbohydrate foods are table sugar, sweets, and beverages sweetened with added sugar.  Tips for Reducing Sodium (Salt) Although sodium is important for your body to function, too much sodium can be harmful for people with high blood pressure. As sodium and fluid buildup in your tissues and bloodstream, your blood pressure increases. High blood pressure may cause damage to other organs and increase your risk for a stroke. Even if you take a pill for blood pressure or a water pill (diuretic) to remove fluid, it is still important to have less salt in your diet. Ask your doctor and RDN what amount of sodium is right for you. Avoid processed foods. Eat more fresh foods.  Fresh fruits and vegetables are naturally low in sodium, as well as frozen vegetables and fruits that have no added juices or sauces.  Fresh meats are lower in sodium than processed meats, such as bacon, sausage, and hotdogs. Read the nutrition label or ask your  butcher to help you find a fresh meat that is low in sodium. Eat less salt--at the table and when cooking.  A single teaspoon of table salt has 2,300 mg of sodium.  Leave the salt out of recipes for pasta, casseroles, and soups.  Ask your RDN how to cook your favorite recipes without sodium Be a smart shopper.  Look for  food packages that say "salt-free" or "sodium-free." These items contain less than 5 milligrams of sodium per serving.  "Very low-sodium" products contain less than 35 milligrams of sodium per serving.  "Low-sodium" products contain less than 140 milligrams of sodium per serving.  Beware for "Unsalted" or "No Added Salt" products. These items may still be high in sodium. Check the nutrition label. Add flavors to your food without adding sodium.  Try lemon juice, lime juice, fruit juice or vinegar.  Dry or fresh herbs add flavor. Try basil, bay leaf, dill, rosemary, parsley, sage, dry mustard, nutmeg, thyme, and paprika.  Pepper, red pepper flakes, and cayenne pepper can add spice t your meals without adding sodium. Hot sauce contains sodium, but if you use just a drop or two, it will not add up to much.  Buy a sodium-free seasoning blend or make your own at home.  Additional Lifestyle Tips Achieve and maintain a healthy weight. Talk with your RDN or your doctor about what is a healthy weight for you. Set goals to reach and maintain that weight.  To lose weight, reduce your calorie intake along with increasing your physical activity. A weight loss of 10 to 15 pounds could reduce LDL-cholesterol by 5 milligrams per deciliter.  Participate in physical activity. Talk with your health care team to find out what types of physical activity are best for you. Set a plan to get about 30 minutes of exercise on most days.  Foods Recommended Food Group Foods Recommended  Grains Whole grain breads and cereals, including whole wheat, barley, rye, buckwheat, corn, teff, quinoa, millet, amaranth, brown or wild rice, sorghum, and oats Pasta, especially whole wheat or other whole grain types  [employer] [place], quinoa or wild rice Whole grain crackers, bread, rolls, pitas Home-made bread with reduced-sodium baking soda  Protein Foods Lean cuts of beef and pork (loin, leg, round, extra lean hamburger)  Skinless  [employer] and other wild game Dried beans and peas Nuts and nut butters Meat alternatives made with soy or textured vegetable protein  Egg whites or egg substitute Cold cuts made with lean meat or soy protein  Dairy Nonfat (skim), low-fat, or 1%-fat milk  Nonfat or low-fat yogurt or cottage cheese Fat-free and low-fat cheese  Vegetables Fresh, frozen, or canned vegetables without added fat or salt   Fruits Fresh, frozen, canned, or dried fruit   Oils Unsaturated oils (corn, olive, peanut, soy, sunflower, canola)  Soft or liquid margarines and vegetable oil spreads  Salad dressings Seeds and nuts  Avocado   Foods Not Recommended Food Group Foods Not Recommended  Grains Breads or crackers topped with salt Cereals (hot or cold) with more than 300 mg sodium per serving Biscuits, cornbread, and other "quick" breads prepared with baking soda Bread crumbs or stuffing mix from a store High-fat bakery products, such as doughnuts, biscuits, croissants, danish pastries, pies, cookies Instant cooking foods to which you add hot water and stir--potatoes, noodles, rice, etc. Packaged starchy foods--seasoned noodle or rice dishes, stuffing mix, macaroni and cheese dinner Snacks made with partially hydrogenated oils, including chips, cheese puffs, snack  mixes, regular crackers, butter-flavored popcorn  Protein Foods Higher-fat cuts of meats (ribs, t-bone steak, regular hamburger) Bacon, sausage, or hot dogs Cold cuts, such as salami or bologna, deli meats, cured meats, corned beef Organ meats (liver, brains, gizzards, sweetbreads) Poultry with skin Fried or smoked meat, poultry, and fish Whole eggs and egg yolks (more than 2-4 per week) Salted legumes, nuts, seeds, or nut/seed butters Meat alternatives with high levels of sodium (>300 mg per serving) or saturated fat (>5 g per serving)  Dairy Whole milk,?2% fat milk, buttermilk Whole milk yogurt or ice  cream Cream Half-&-half Cream cheese Sour cream Cheese  Vegetables Canned or frozen vegetables with salt, fresh vegetables prepared with salt, butter, cheese, or cream sauce Fried vegetables Pickled vegetables such as olives, pickles, or sauerkraut  Fruits Fried fruits Fruits served with butter or cream  Oils Butter, stick margarine, shortening Partially hydrogenated oils or trans fats Tropical oils (coconut, palm, palm kernel oils)  Other Candy, sugar sweetened soft drinks and desserts Salt, sea salt, garlic salt, and seasoning mixes containing salt Bouillon cubes Ketchup, barbecue sauce, Worcestershire sauce, soy sauce, teriyaki sauce Miso Salsa Pickles, olives, relish   Heart Healthy Consistent Carbohydrate Vegetarian (Lacto-Ovo) Sample 1-Day Menu  Breakfast 1 cup oatmeal, cooked (2 carbohydrate servings)   cup blueberries (1 carbohydrate serving)  11 almonds, without salt  1 cup 1% milk (1 carbohydrate serving)  1 cup coffee  Morning Snack 1 cup fat-free plain yogurt (1 carbohydrate serving)  Lunch 1 whole wheat bun (1 carbohydrate servings)  1 black bean burger (1 carbohydrate servings)  1 slice cheddar cheese, low sodium  2 slices tomatoes  2 leaves lettuce  1 teaspoon mustard  1 small pear (1 carbohydrate servings)  1 cup green tea, unsweetened  Afternoon Snack 1/3 cup trail mix with nuts, seeds, and raisins, without salt (1 carbohydrate servinga)  Evening Meal  cup meatless chicken  2/3 cup brown rice, cooked (2 carbohydrate servings)  1 cup broccoli, cooked (2/3 carbohydrate serving)   cup carrots, cooked (1/3 carbohydrate serving)  2 teaspoons olive oil  1 teaspoon balsamic vinegar  1 whole wheat dinner roll (1 carbohydrate serving)  1 teaspoon margarine, soft, tub  1 cup 1% milk (1 carbohydrate serving)  Evening Snack 1 extra small banana (1 carbohydrate serving)  1 tablespoon peanut butter   Heart Healthy Consistent Carbohydrate Vegan Sample 1-Day  Menu  Breakfast 1 cup oatmeal, cooked (2 carbohydrate servings)   cup blueberries (1 carbohydrate serving)  11 almonds, without salt  1 cup soymilk fortified with calcium, vitamin B12, and vitamin D  1 cup coffee  Morning Snack 6 ounces soy yogurt (1 carbohydrate servings)  Lunch 1 whole wheat bun(1 carbohydrate servings)  1 black bean burger (1 carbohydrate serving)  2 slices tomatoes  2 leaves lettuce  1 teaspoon mustard  1 small pear (1 carbohydrate servings)  1 cup green tea, unsweetened  Afternoon Snack 1/3 cup trail mix with nuts, seeds, and raisins, without salt (1 carbohydrate servings)  Evening Meal  cup meatless chicken  2/3 cup brown rice, cooked (2 carbohydrate servings)  1 cup broccoli, cooked (2/3 carbohydrate serving)   cup carrots, cooked (1/3 carbohydrate serving)  2 teaspoons olive oil  1 teaspoon balsamic vinegar  1 whole wheat dinner roll (1 carbohydrate serving)  1 teaspoon margarine, soft, tub  1 cup soymilk fortified with calcium, vitamin B12, and vitamin D  Evening Snack 1 extra small banana (1 carbohydrate serving)  1 tablespoon peanut butter  Heart Healthy Consistent Carbohydrate Sample 1-Day Menu  Breakfast 1 cup cooked oatmeal (2 carbohydrate servings)  3/4 cup blueberries (1 carbohydrate serving)  1 ounce almonds  1 cup skim milk (1 carbohydrate serving)  1 cup coffee  Morning Snack 1 cup sugar-free nonfat yogurt (1 carbohydrate serving)  Lunch 2 slices whole-wheat bread (2 carbohydrate servings)  2 ounces lean [place] breast  1 ounce low-fat Swiss cheese  1 teaspoon mustard  1 slice tomato  1 lettuce leaf  1 small pear (1 carbohydrate serving)  1 cup skim milk (1 carbohydrate serving)  Afternoon Snack 1 ounce trail mix with unsalted nuts, seeds, and raisins (1 carbohydrate serving)  Evening Meal 3 ounces salmon  2/3 cup cooked brown rice (2 carbohydrate servings)  1 teaspoon soft margarine  1 cup cooked broccoli with 1/2 cup cooked  carrots (1 carbohydrate serving  Carrots, cooked, boiled, drained, without salt  1 cup lettuce  1 teaspoon olive oil with vinegar for dressing  1 small whole grain roll (1 carbohydrate serving)  1 teaspoon soft margarine  1 cup unsweetened tea  Evening Snack 1 extra-small banana (1 carbohydrate serving)  Copyright 2020  [employer]. All rights reserved.    General, Healthful Nutrition Therapy  This handout provides you with the information you'll need to follow a general, healthful diet, which can be tailored to your personal preferences. There are several benefits to following a general, healthful diet: It could mean less calories, less salt, less added sugars, and less saturated fat than many other diets. This outcome will depend on the foods you choose. Eating more whole grains, beans, lentils, fruits, vegetables, nuts, and seeds may improve how much fiber, vitamins, and minerals you eat.  It can lower your risk for health conditions like diabetes, heart disease, hypertension, stroke, and cancer. Your registered dietitian nutritionist (RDN) may recommend portion sizes based on your individual needs and personal and cultural preferences.  Tips Every day, eat a variety of fruits and vegetables in a variety of colors. Be sure to include lots of dark green, red, blue-purple, and orange vegetables. Choose whole grains for at least half of your grain selections. Eat more beans, peas, and lentils. Try meatless alternatives. Get protein in your diet from eggs, fish, poultry, beans, peas, lentils, and nuts/nut butters. Low-fat or fat-free dairy products are also good sources of protein. Keep your salt intake to a minimum (less than 2300 milligrams per day). Limit use of salt, soy sauce, or fish sauce when cooking. Eat freshly prepared meals at home. Processed, prepackaged, and restaurant foods contain more salt. Choose fresh fruits and vegetables for snacks. Choose  products with lower sodium content when grocery shopping. Limit your daily sugar intake. Sugar may be used in sauces, marinades, dressings, and condiments - even those that do not taste sweet.  Sugar can be found in honey, syrups, jelly, fruit juice, and fruit juice concentrate. Limit sugar-sweetened beverages like sodas and fruit juice, sugary snacks, and candy. It's best to choose products without added sugar, but if you do eat them, read labels carefully so you know how much sugar is in each portion. It is better to eat unsaturated fats than saturated fats. Use fats and oils in moderation, up to 5 servings per day. Unsaturated fat is found in fish, avocado, nuts, and oils like sunflower, canola, avocado and olive oils. Saturated fat is found in fatty meat, butter, ice cream, palm and coconut oil, cream, cheese, and lard. Many processed foods,  fried foods, fast food items, convenience foods like frozen pizza and snack foods, and sweets including pies, cookies, and other pastries are high in fat. Check nutrition labels and choose these foods less often. Use vegetable oil instead of lard or butter for cooking. Boil, steam, or bake your food instead of deep frying in oil. Remove the fatty part of meats before cooking.  Foods to Choose or to Limit Choose a healthful balance of foods from each food group at your meals. Your RDN may make individualized portion size recommendations based on your needs. Food Group Foods to Choose Foods to Limit  Grains Whole wheat, barley, rye, buckwheat, corn, teff, quinoa, millet, amaranth, brown and wild rice, sorghum, and oats; focus on intact cooked whole grains Grain products, such as bread, rolls, prepared breakfast cereals, crackers, and pasta made from whole grains that are low in added sugars, saturated fat, and sodium Sweetened, low-fiber breakfast cereals Packaged (high sugar, refined ingredients) baked goods Snack crackers and chips made of refined  ingredients, cheese crackers, butter crackers Breads made with refined ingredients and saturated fats, such as biscuits, frozen waffles, sweet breads, doughnuts, pastries, packaged baking mixes, pancakes, cakes, and cookies  Protein Foods Meat, including lean, trimmed cuts of beef, pork, or lamb a few times per week or less Poultry, including skinless chicken or [place] Seafood, including fish, shrimp, lobster, clams, and scallops at least twice per week. Focus on fatty fish, such as salmon, herring, and sardines, as a rich source of omega-3 fatty acids Eggs Nuts and seeds, such as peanuts, almonds, pistachios, and sunflower seeds (unsalted varieties) Nut and seed butters, such as peanut butter, almond butter, and sunflower seed butter (reduced-sodium varieties)  Soy foods such as tofu, tempeh, or soy nuts Plant protein-based meat alternatives, such as veggie burgers and sausages (reduced-sodium varieties) Unsalted beans, lentils, or peas at least a few times per week in place of other protein sources Marbled or fatty red meats (beef, pork, lamb), such as ribs Processed red meats, such as bacon, sausage, and ham Poultry (chicken and [place]) with skin Fried meats, poultry, or fish [employer], shark, and [place] (may contain high levels of mercury) Deli meats, such as pastrami, bologna, or salami Fried eggs Salted beans, peas, lentils, nuts, seeds, or nut/seed butters Meat alternatives with high levels of sodium or saturated fat  Dairy and Dairy Alternatives Low-fat or fat-free milk, yogurt (low in added sugars), cottage cheese, and cheeses Fortified soymilk or soy yogurt Whole milk, cream, cheeses made from whole milk, sour cream Yogurt or ice cream made from whole milk or with added sugar Cream cheese made from whole milk  Vegetables A variety of vegetables, including dark-green, red, blue-purple, and orange vegetables Low-sodium vegetable juices Canned or frozen vegetables with salt,  fresh vegetables prepared with salt Fried vegetables Vegetables in cream sauce or cheese sauce Tomato or pasta sauce with high levels of salt or sugar  Fruit A variety of whole fruits, canned fruit packed in water, or dried fruit 100% fruit juice (limited to  cup per day) Fruits packed in syrup or made with added sugar  Fats and Oils Unsaturated vegetable oils, including olive, peanut, and canola oils Vegetable oil-based margarines and spreads Salad dressing and mayonnaise made from unsaturated vegetable oils Solid shortening or partially hydrogenated oils Solid margarine made with hydrogenated or partially hydrogenated oils Butter  Beverages Coffee and tea (unsweetened) Water Sweetened drinks, including sweetened coffee or tea drinks, soda, energy drinks, and sports drinks  Other Prepared  foods, including soups, casseroles, salads, baked goods, and snacks made from recommended ingredients, with low levels of added saturated fat, added sugars, or salt Sugary and/or fatty desserts, candy, and other sweets; salt and seasonings that contain salt Fried foods   General, Healthful Diet Sample 1-Day Menu View Nutrient Info Breakfast 1 cup oatmeal  cup blueberries 1 ounce almonds 1 cup 1% milk or fortified soymilk 1 cup unsweetened coffee  Lunch 2 slices whole wheat bread 3 ounces [place] slices 2 lettuce leaves 2 slices tomato 1 ounce reduced-fat, reduced sodium cheese  cup carrot sticks  cup hummus 1 banana 1 cup 1% milk or fortified soymilk 1 cup unsweetened tea  Evening Meal 4 ounces salmon, baked  cup cooked brown rice 1 cup green beans, cooked 1 cup mixed greens salad 1 teaspoon olive oil mixed with vinegar of choice 1 whole wheat dinner roll 1 teaspoon margarine, soft, tub (for roll) 1 cup water  Evening Snack 1 cup low-fat yogurt  cup sliced peaches

## 2023-04-21 NOTE — Plan of Care (Signed)
Nutrition Education Note  RD consulted for nutrition education regarding acute CHF exacerbation and morbid obesity.  RD provided "Heart Healthy/Consistent Carbohydrate" and "General Healthy Nutrition" handout from the Academy of Nutrition and Dietetics.   Reviewed patient's dietary recall. Which includes [place] sausage, eggs and keto bread for breakfast. Lunch may be a [place] sandwich. Dinner varies but may include a meat such as chicken and a salad. He tries to eat consistent carbohydrates. He does not typically add extra salt to foods. He tries to eat fresh vegetables when possible but sometimes it is more cost effective for him to purchase canned vegetable such as asparagus or beets; however he does drain them and add Smart Balance butter. 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   Provided examples on ways to decrease sodium intake in diet. Discouraged intake of processed foods and use of salt shaker. Encouraged fresh fruits and vegetables as well as whole grain sources of carbohydrates to maximize fiber intake.   RD discussed why it is important for patient to adhere to diet recommendations, and emphasized the role of fluids, foods to avoid, and importance of weighing self daily. Teach back method used.  Expect fair compliance.  Body mass index is 44.44 kg/m. Pt meets criteria for morbid obesity based on current BMI.  Current diet order is Heart Healthy/Carb modified. No documented meal completions on file, however pt reports eating at his baseline.. Labs and medications reviewed. No further nutrition interventions warranted at this time. RD contact information provided. If additional nutrition issues arise, please re-consult RD.   Drusilla Kanner, RDN, LDN Clinical Nutrition

## 2023-04-21 NOTE — Progress Notes (Signed)
DAILY PROGRESS NOTE   Patient Name: Arthur Church Date of Encounter: 04/21/2023 Cardiologist: Chrystie Nose, MD  Chief Complaint   Breathing better  Patient Profile   Arthur Church is a 67 y.o. male with a hx of tobacco abuse, ETOH use, HTN, atrial flutter with ablation 01/2023, systolic HF, CM, COPD on home 02 at night, pre-diabetes, CAD with  CABG X 2 V who is being seen 04/20/2023 for the evaluation of acute CHF at the request of Dr Benjamine Mola   Subjective   Some PAC's overnight. HR appears improved today in the 70's at rest. Diuresed another 3.3L negative yesterday - now 5.5L negative. Remains volume overloaded. Small change in creatinine, but remains normal at 0.9.   Objective   Vitals:   04/20/23 1942 04/21/23 0416 04/21/23 0711 04/21/23 0757  BP: 130/67 (!) 122/51  139/60  Pulse: (!) 49 (!) 53  (!) 50  Resp: 18 17  18   Temp: 98.4 F (36.9 C) 98 F (36.7 C)  98.5 F (36.9 C)  TempSrc:    Oral  SpO2: 94% 96%  92%  Weight:   (!) 152.8 kg   Height:        Intake/Output Summary (Last 24 hours) at 04/21/2023 0930 Last data filed at 04/21/2023 1610 Gross per 24 hour  Intake 1200 ml  Output 4500 ml  Net -3300 ml   Filed Weights   04/18/23 1935 04/20/23 0836 04/21/23 0711  Weight: (!) 156.5 kg (!) 154.7 kg (!) 152.8 kg    Physical Exam   General appearance: alert, no distress, and morbidly obese Neck: JVD - 5 cm above sternal notch, no carotid bruit, and thyroid not enlarged, symmetric, no tenderness/mass/nodules Lungs: diminished breath sounds bibasilar Heart: regular rate and rhythm Abdomen: soft, non-tender; bowel sounds normal; no masses,  no organomegaly and obese Extremities: edema 2+ bilateral edema Pulses: 2+ and symmetric Skin: Skin color, texture, turgor normal. No rashes or lesions Neurologic: Grossly normal Psych: Pleasant  Inpatient Medications    Scheduled Meds:  amiodarone  100 mg Oral Daily   amoxicillin-clavulanate  1 tablet Oral Q12H    apixaban  5 mg Oral BID   aspirin EC  81 mg Oral Daily   atorvastatin  80 mg Oral Daily   carvedilol  3.125 mg Oral BID WC   fluticasone furoate-vilanterol  1 puff Inhalation Daily   And   umeclidinium bromide  1 puff Inhalation Daily   furosemide  60 mg Intravenous BID   insulin aspart  0-15 Units Subcutaneous TID WC   insulin aspart  0-5 Units Subcutaneous QHS   losartan  12.5 mg Oral Daily   montelukast  10 mg Oral QHS   pantoprazole  40 mg Oral Daily   spironolactone  12.5 mg Oral Daily    Continuous Infusions:   PRN Meds: acetaminophen, albuterol, ALPRAZolam, ipratropium-albuterol, melatonin, polyethylene glycol, prochlorperazine   Labs   Results for orders placed or performed during the hospital encounter of 04/18/23 (from the past 48 hour(s))  CBG monitoring, ED     Status: Abnormal   Collection Time: 04/19/23 11:46 AM  Result Value Ref Range   Glucose-Capillary 230 (H) 70 - 99 mg/dL    Comment: Glucose reference range applies only to samples taken after fasting for at least 8 hours.  Glucose, capillary     Status: Abnormal   Collection Time: 04/19/23  1:37 PM  Result Value Ref Range   Glucose-Capillary 206 (H) 70 - 99 mg/dL  Comment: Glucose reference range applies only to samples taken after fasting for at least 8 hours.  Glucose, capillary     Status: Abnormal   Collection Time: 04/19/23  4:07 PM  Result Value Ref Range   Glucose-Capillary 160 (H) 70 - 99 mg/dL    Comment: Glucose reference range applies only to samples taken after fasting for at least 8 hours.  Glucose, capillary     Status: Abnormal   Collection Time: 04/19/23  7:32 PM  Result Value Ref Range   Glucose-Capillary 106 (H) 70 - 99 mg/dL    Comment: Glucose reference range applies only to samples taken after fasting for at least 8 hours.  HIV Antibody (routine testing w rflx)     Status: None   Collection Time: 04/20/23  1:50 AM  Result Value Ref Range   HIV Screen 4th Generation wRfx Non  Reactive Non Reactive    Comment: Performed at [facility], [address] [place], [place] [address]  CBC     Status: Abnormal   Collection Time: 04/20/23  1:50 AM  Result Value Ref Range   WBC 11.6 (H) 4.0 - 10.5 K/uL   RBC 4.71 4.22 - 5.81 MIL/uL   Hemoglobin 12.5 (L) 13.0 - 17.0 g/dL   HCT 60.4 54.0 - 98.1 %   MCV 85.6 80.0 - 100.0 fL   MCH 26.5 26.0 - 34.0 pg   MCHC 31.0 30.0 - 36.0 g/dL   RDW 19.1 (H) 47.8 - 29.5 %   Platelets 238 150 - 400 K/uL   nRBC 0.0 0.0 - 0.2 %    Comment: Performed at [facility], [address], [place], [place] [address]  Basic metabolic panel     Status: Abnormal   Collection Time: 04/20/23  1:50 AM  Result Value Ref Range   Sodium 137 135 - 145 mmol/L   Potassium 3.9 3.5 - 5.1 mmol/L   Chloride 97 (L) 98 - 111 mmol/L   CO2 33 (H) 22 - 32 mmol/L   Glucose, Bld 153 (H) 70 - 99 mg/dL    Comment: Glucose reference range applies only to samples taken after fasting for at least 8 hours.   BUN 18 8 - 23 mg/dL   Creatinine, Ser 8.65 0.61 - 1.24 mg/dL   Calcium 9.0 8.9 - 78.4 mg/dL   GFR, Estimated >69 >62 mL/min    Comment: (NOTE) Calculated using the CKD-EPI Creatinine Equation (2021)    Anion gap 7 5 - 15    Comment: Performed at [facility], [address], [place], [place] [address]  TSH     Status: None   Collection Time: 04/20/23  2:37 AM  Result Value Ref Range   TSH 1.680 0.350 - 4.500 uIU/mL    Comment: Performed by a 3rd Generation assay with a functional sensitivity of <=0.01 uIU/mL. Performed at [facility], [address], [place], [place] [address]   T4, free     Status: Abnormal   Collection Time: 04/20/23  2:37 AM  Result Value Ref Range   Free T4 1.14 (H) 0.61 - 1.12 ng/dL    Comment: (NOTE) Biotin ingestion may interfere with free T4 tests. If the results are inconsistent with the TSH level, previous test results, or the clinical presentation, then consider biotin interference. If needed, order  repeat testing after stopping biotin. Performed at [facility], [address] [place], [place] [address]   Glucose, capillary  Status: Abnormal   Collection Time: 04/20/23  7:41 AM  Result Value Ref Range   Glucose-Capillary 117 (H) 70 - 99 mg/dL    Comment: Glucose reference range applies only to samples taken after fasting for at least 8 hours.  Glucose, capillary     Status: Abnormal   Collection Time: 04/20/23 11:18 AM  Result Value Ref Range   Glucose-Capillary 102 (H) 70 - 99 mg/dL    Comment: Glucose reference range applies only to samples taken after fasting for at least 8 hours.  Strep pneumoniae urinary antigen     Status: None   Collection Time: 04/20/23 11:25 AM  Result Value Ref Range   Strep Pneumo Urinary Antigen NEGATIVE NEGATIVE    Comment:        Infection due to S. pneumoniae cannot be absolutely ruled out since the antigen present may be below the detection limit of the test. Performed at [facility], [address] [place], [place] [address]   Glucose, capillary     Status: Abnormal   Collection Time: 04/20/23  4:11 PM  Result Value Ref Range   Glucose-Capillary 131 (H) 70 - 99 mg/dL    Comment: Glucose reference range applies only to samples taken after fasting for at least 8 hours.  Glucose, capillary     Status: Abnormal   Collection Time: 04/20/23  7:41 PM  Result Value Ref Range   Glucose-Capillary 141 (H) 70 - 99 mg/dL    Comment: Glucose reference range applies only to samples taken after fasting for at least 8 hours.  Procalcitonin     Status: None   Collection Time: 04/21/23  2:20 AM  Result Value Ref Range   Procalcitonin <0.10 ng/mL    Comment:        Interpretation: PCT (Procalcitonin) <= 0.5 ng/mL: Systemic infection (sepsis) is not likely. Local bacterial infection is possible. (NOTE)       Sepsis PCT Algorithm           Lower Respiratory Tract                                      Infection PCT Algorithm     ----------------------------     ----------------------------         PCT < 0.25 ng/mL                PCT < 0.10 ng/mL          Strongly encourage             Strongly discourage   discontinuation of antibiotics    initiation of antibiotics    ----------------------------     -----------------------------       PCT 0.25 - 0.50 ng/mL            PCT 0.10 - 0.25 ng/mL               OR       >80% decrease in PCT            Discourage initiation of                                            antibiotics      Encourage discontinuation  of antibiotics    ----------------------------     -----------------------------         PCT >= 0.50 ng/mL              PCT 0.26 - 0.50 ng/mL               AND        <80% decrease in PCT             Encourage initiation of                                             antibiotics       Encourage continuation           of antibiotics    ----------------------------     -----------------------------        PCT >= 0.50 ng/mL                  PCT > 0.50 ng/mL               AND         increase in PCT                  Strongly encourage                                      initiation of antibiotics    Strongly encourage escalation           of antibiotics                                     -----------------------------                                           PCT <= 0.25 ng/mL                                                 OR                                        > 80% decrease in PCT                                      Discontinue / Do not initiate                                             antibiotics  Performed at [facility], [address] [place], [place] [address]   CBC     Status: Abnormal   Collection Time: 04/21/23  2:20 AM  Result Value Ref Range   WBC 9.0 4.0 - 10.5 K/uL  RBC 4.85 4.22 - 5.81 MIL/uL   Hemoglobin 13.0 13.0 - 17.0 g/dL   HCT 04.5 40.9 - 81.1 %   MCV 84.5 80.0 - 100.0 fL   MCH 26.8 26.0 - 34.0 pg   MCHC  31.7 30.0 - 36.0 g/dL   RDW 91.4 (H) 78.2 - 95.6 %   Platelets 241 150 - 400 K/uL   nRBC 0.0 0.0 - 0.2 %    Comment: Performed at [facility], [address], [place], [place] [address]  Basic metabolic panel     Status: Abnormal   Collection Time: 04/21/23  2:20 AM  Result Value Ref Range   Sodium 137 135 - 145 mmol/L   Potassium 3.8 3.5 - 5.1 mmol/L   Chloride 97 (L) 98 - 111 mmol/L   CO2 31 22 - 32 mmol/L   Glucose, Bld 113 (H) 70 - 99 mg/dL    Comment: Glucose reference range applies only to samples taken after fasting for at least 8 hours.   BUN 21 8 - 23 mg/dL   Creatinine, Ser 6.57 0.61 - 1.24 mg/dL   Calcium 9.0 8.9 - 84.6 mg/dL   GFR, Estimated >96 >29 mL/min    Comment: (NOTE) Calculated using the CKD-EPI Creatinine Equation (2021)    Anion gap 9 5 - 15    Comment: Performed at [facility], [address] [place], [place] [address]  Glucose, capillary     Status: Abnormal   Collection Time: 04/21/23  7:54 AM  Result Value Ref Range   Glucose-Capillary 113 (H) 70 - 99 mg/dL    Comment: Glucose reference range applies only to samples taken after fasting for at least 8 hours.    ECG   N/A  Telemetry   Sinus rhythm with [facility] - Personally Reviewed  Radiology    ECHOCARDIOGRAM COMPLETE  Result Date: 04/19/2023    ECHOCARDIOGRAM REPORT   Patient Name:   JACKS MUTTI Date of Exam: 04/19/2023 Medical Rec #:  [id number]      Height:       73.0 in Accession #:    [id number]     Weight:       345.0 lb Date of Birth:  Sep 23, 1956      BSA:          2.713 m Patient Age:    66 years       BP:           135/65 mmHg Patient Gender: M              HR:           62 bpm. Exam Location:  Inpatient Procedure: 2D Echo, Color Doppler, Cardiac Doppler and Intracardiac            Opacification Agent Indications:    Aute Diastolic CHF  History:        Patient has prior history of Echocardiogram examinations, most                 recent 03/14/2022. CHF, Arrythmias:Atrial  Fibrillation,                 Signs/Symptoms:Shortness of Breath; Risk Factors:Hypertension                 and Diabetes.  Sonographer:    Raeford Razor Sonographer#2:  Milbert Coulter Referring Phys: Dow Adolph, N  Sonographer Comments: Technically difficult study due to poor echo windows. Image acquisition challenging due to patient  body habitus. IMPRESSIONS  1. Left ventricular ejection fraction, by estimation, is 50%. The left ventricle has mildly decreased function. The left ventricle demonstrates global hypokinesis. Left ventricular diastolic parameters are consistent with Grade II diastolic dysfunction (pseudonormalization).  2. Right ventricular systolic function is mildly reduced. The right ventricular size is normal. Tricuspid regurgitation signal is inadequate for assessing PA pressure.  3. Left atrial size was mild to moderately dilated.  4. The mitral valve is abnormal. Mild mitral valve regurgitation (MR not fully visualized, could be worse). No evidence of mitral stenosis.  5. The aortic valve is tricuspid. Aortic valve regurgitation is not visualized. No aortic stenosis is present.  6. The inferior vena cava is dilated in size with <50% respiratory variability, suggesting right atrial pressure of 15 mmHg.  7. Technically difficult study with very poor images. FINDINGS  Left Ventricle: Left ventricular ejection fraction, by estimation, is 50%. The left ventricle has mildly decreased function. The left ventricle demonstrates global hypokinesis. Definity contrast agent was given IV to delineate the left ventricular endocardial borders. The left ventricular internal cavity size was normal in size. There is no left ventricular hypertrophy. Left ventricular diastolic parameters are consistent with Grade II diastolic dysfunction (pseudonormalization). Right Ventricle: The right ventricular size is normal. No increase in right ventricular wall thickness. Right ventricular systolic function is mildly reduced.  Tricuspid regurgitation signal is inadequate for assessing PA pressure. Left Atrium: Left atrial size was mild to moderately dilated. Right Atrium: Right atrial size was normal in size. Pericardium: There is no evidence of pericardial effusion. Mitral Valve: The mitral valve is abnormal. Mild mitral valve regurgitation. No evidence of mitral valve stenosis. Tricuspid Valve: The tricuspid valve is normal in structure. Tricuspid valve regurgitation is trivial. Aortic Valve: The aortic valve is tricuspid. Aortic valve regurgitation is not visualized. No aortic stenosis is present. Aortic valve mean gradient measures 3.0 mmHg. Aortic valve peak gradient measures 6.2 mmHg. Aortic valve area, by VTI measures 2.26 cm. Pulmonic Valve: The pulmonic valve was normal in structure. Pulmonic valve regurgitation is not visualized. Aorta: The aortic root is normal in size and structure. Venous: The inferior vena cava is dilated in size with less than 50% respiratory variability, suggesting right atrial pressure of 15 mmHg. IAS/Shunts: No atrial level shunt detected by color flow Doppler.  LEFT VENTRICLE PLAX 2D LVIDd:         6.00 cm     Diastology LVIDs:         4.40 cm     LV e' medial:    5.11 cm/s LV PW:         1.30 cm     LV E/e' medial:  26.4 LV IVS:        1.40 cm     LV e' lateral:   6.41 cm/s LVOT diam:     2.20 cm     LV E/e' lateral: 21.1 LV SV:         60 LV SV Index:   22 LVOT Area:     3.80 cm  LV Volumes (MOD) LV vol d, MOD A4C: 95.6 ml LV vol s, MOD A4C: 47.2 ml LV SV MOD A4C:     95.6 ml RIGHT VENTRICLE             IVC RV S prime:     10.90 cm/s  IVC diam: 3.20 cm TAPSE (M-mode): 2.4 cm LEFT ATRIUM            Index LA  diam:      5.80 cm  2.14 cm/m LA Vol (A4C): 111.0 ml 40.92 ml/m  AORTIC VALVE AV Area (Vmax):    2.35 cm AV Area (Vmean):   2.26 cm AV Area (VTI):     2.26 cm AV Vmax:           124.00 cm/s AV Vmean:          81.000 cm/s AV VTI:            0.264 m AV Peak Grad:      6.2 mmHg AV Mean Grad:       3.0 mmHg LVOT Vmax:         76.50 cm/s LVOT Vmean:        48.200 cm/s LVOT VTI:          0.157 m LVOT/AV VTI ratio: 0.59  AORTA Ao Root diam: 3.60 cm MITRAL VALVE MV Area (PHT): 4.21 cm     SHUNTS MV Decel Time: 180 msec     Systemic VTI:  0.16 m MR Peak grad: 36.5 mmHg     Systemic Diam: 2.20 cm MR Vmax:      302.00 cm/s MV E velocity: 135.00 cm/s MV A velocity: 48.20 cm/s MV E/A ratio:  2.80 Dalton McleanMD Electronically signed by Wilfred Lacy Signature Date/Time: 04/19/2023/10:47:18 AM    Final     Cardiac Studies   See echo above  Assessment   Principal Problem:   CAP (community acquired pneumonia) Active Problems:   Morbid obesity (HCC)   PAF (paroxysmal atrial fibrillation) (HCC)   Essential hypertension   Diabetes mellitus type 2, diet-controlled (HCC)   Plan   Excellent diuresis overnight- probably has another 4-6L to go - continue IV diuresis. Creatinine is tolerating it. On GDMT including carvedilol (decreased d/t bradycardia), losartan (had issues with hyperkalemia on Entresto in the past, so will be avoided) and aldactone. Will likely add SGLT2 inhibitor given diabetes and CAD tomorrow if renal function stable in the face of aggressive diuresis.  Time Spent Directly with Patient:  I have spent a total of 25 minutes with the patient reviewing hospital notes, telemetry, EKGs, labs and examining the patient as well as establishing an assessment and plan that was discussed personally with the patient.  > 50% of time was spent in direct patient care.  Length of Stay:  LOS: 2 days   Chrystie Nose, MD, [facility], FACP  [place]  [facility]  Medical Director of the [facility] Diplomate of the American Board of Clinical Lipidology Attending Cardiologist  Direct Dial: [phone]  Fax: [phone]  Website:  [email or web address]  Lisette Abu Shevon Sian 04/21/2023, 9:30 AM

## 2023-04-22 DIAGNOSIS — I509 Heart failure, unspecified: Secondary | ICD-10-CM | POA: Diagnosis not present

## 2023-04-22 DIAGNOSIS — I5033 Acute on chronic diastolic (congestive) heart failure: Secondary | ICD-10-CM | POA: Diagnosis not present

## 2023-04-22 DIAGNOSIS — J189 Pneumonia, unspecified organism: Secondary | ICD-10-CM | POA: Diagnosis not present

## 2023-04-22 DIAGNOSIS — I5023 Acute on chronic systolic (congestive) heart failure: Secondary | ICD-10-CM | POA: Diagnosis not present

## 2023-04-22 DIAGNOSIS — R042 Hemoptysis: Secondary | ICD-10-CM

## 2023-04-22 DIAGNOSIS — I5022 Chronic systolic (congestive) heart failure: Secondary | ICD-10-CM | POA: Diagnosis not present

## 2023-04-22 LAB — RESPIRATORY PANEL BY PCR

## 2023-04-22 LAB — BASIC METABOLIC PANEL
Anion gap: 9 (ref 5–15)
BUN: 16 mg/dL (ref 8–23)
CO2: 32 mmol/L (ref 22–32)
Calcium: 9.4 mg/dL (ref 8.9–10.3)
Chloride: 97 mmol/L — ABNORMAL LOW (ref 98–111)
Creatinine, Ser: 0.74 mg/dL (ref 0.61–1.24)
GFR, Estimated: >60 mL/min (ref >60)
Glucose, Bld: 130 mg/dL — ABNORMAL HIGH (ref 70–99)
Potassium: 3.8 mmol/L (ref 3.5–5.1)
Sodium: 138 mmol/L (ref 135–145)

## 2023-04-22 LAB — CBC
HCT: 46 % (ref 39.0–52.0)
Hemoglobin: 14.6 g/dL (ref 13.0–17.0)
MCH: 26.5 pg (ref 26.0–34.0)
MCHC: 31.7 g/dL (ref 30.0–36.0)
MCV: 83.5 fL (ref 80.0–100.0)
Platelets: 297 10*3/uL (ref 150–400)
RBC: 5.51 MIL/uL (ref 4.22–5.81)
RDW: 16 % — ABNORMAL HIGH (ref 11.5–15.5)
WBC: 8.9 10*3/uL (ref 4.0–10.5)
nRBC: 0 % (ref 0.0–0.2)

## 2023-04-22 LAB — GLUCOSE, CAPILLARY
Glucose-Capillary: 110 mg/dL — ABNORMAL HIGH (ref 70–99)
Glucose-Capillary: 139 mg/dL — ABNORMAL HIGH (ref 70–99)
Glucose-Capillary: 114 mg/dL — ABNORMAL HIGH (ref 70–99)
Glucose-Capillary: 128 mg/dL — ABNORMAL HIGH (ref 70–99)

## 2023-04-22 NOTE — Progress Notes (Signed)
Mobility Specialist Progress Note   04/22/23 1530  Mobility  Activity Ambulated independently in hallway  Level of Assistance Modified independent, requires aide device or extra time  Assistive Device None  Distance Ambulated (ft) 950 ft  Range of Motion/Exercises Active;All extremities  Activity Response Tolerated well   Pre Ambulation:  HR 63, SpO2 91% RA During Ambulation: HR 102-112, SpO2 81-86% 2LO2 (refused O2 titration) Post Ambulation: HR 78, SpO2 95% 2LO2  Patient received in recliner agreeable to participate. Ambulated independently with steady gait. Oxygen desaturated to mid-low 80's, patient deferred increasing oxygen supplementation. Returned to room without complaint or incident. Was slightly dyspneic but recovered quickly with seated rest. Was left in recliner with all needs met, call bell in reach.   Arthur Church, BS EXP Mobility Specialist Please contact via SecureChat or Rehab office at [phone]

## 2023-04-22 NOTE — Progress Notes (Signed)
Physical Therapy Treatment Patient Details Name: Arthur Church MRN: [id number] DOB: April 02, 1956 Today's Date: 04/22/2023   History of Present Illness 67 y.o. male presents to [facility] hospital on 04/18/2023 with SOB, productive cough with streaks of blood. CT chest demonstrates multilobar PNA. Pt also undergoing management of acute on chronic HFpEF. PMH includes CHF, PAF, CAD, OSA, COPD.    PT Comments    Pt was seen for progression of mobility on chair to walk a short trip and to review strengthening ex's for BLE's.  Pt is having issues with O2 off maintaining O2 sats, so instructed in pursed lip breathing.  He reports having learned a version and discussed physiology of the breathing as well as the impact systemically.  Agreed to try to do the pursed lip technique while walking and ASAP after walking to reduce the desaturation of walking off O2.  Pt verbalized understanding of the education.   Recommendations for follow up therapy are one component of a multi-disciplinary discharge planning process, led by the attending physician.  Recommendations may be updated based on patient status, additional functional criteria and insurance authorization.  Follow Up Recommendations       Assistance Recommended at Discharge PRN  Patient can return home with the following A little help with walking and/or transfers;Assistance with cooking/housework;Assist for transportation;Help with stairs or ramp for entrance   Equipment Recommendations  None recommended by PT    Recommendations for Other Services       Precautions / Restrictions Precautions Precautions: Other (comment) Precaution Comments: monitor SpO2 Restrictions Weight Bearing Restrictions: No     Mobility  Bed Mobility               General bed mobility comments: up in chair when PT arrives    Transfers Overall transfer level: Modified independent Equipment used: None                    Ambulation/Gait Ambulation/Gait  assistance: Modified independent (Device/Increase time) Gait Distance (Feet): 45 Feet Assistive device: None Gait Pattern/deviations: Step-through pattern, Wide base of support, Decreased stride length           Stairs             Wheelchair Mobility    Modified Rankin (Stroke Patients Only)       Balance Overall balance assessment: Modified Independent                                          Cognition Arousal/Alertness: Awake/alert Behavior During Therapy: WFL for tasks assessed/performed Overall Cognitive Status: Within Functional Limits for tasks assessed                                          Exercises      General Comments General comments (skin integrity, edema, etc.): Pt was observed for sats during session with a need to use O2 during activity, drops to 88% without supplemental O2 but with can recover sat from 88% in 20 seconds with pursed lip breathing      Pertinent Vitals/Pain Pain Assessment Pain Assessment: Faces Faces Pain Scale: No hurt    Home Living  Prior Function            PT Goals (current goals can now be found in the care plan section) Acute Rehab PT Goals Patient Stated Goal: to return home, improve activity tolerance Progress towards PT goals: Progressing toward goals    Frequency    Min 2X/week      PT Plan Current plan remains appropriate    Co-evaluation              AM-PAC PT "6 Clicks" Mobility   Outcome Measure  Help needed turning from your back to your side while in a flat bed without using bedrails?: A Little Help needed moving from lying on your back to sitting on the side of a flat bed without using bedrails?: A Little Help needed moving to and from a bed to a chair (including a wheelchair)?: A Little Help needed standing up from a chair using your arms (e.g., wheelchair or bedside chair)?: A Little Help needed to walk in  hospital room?: A Little Help needed climbing 3-5 steps with a railing? : A Little 6 Click Score: 18    End of Session Equipment Utilized During Treatment: Oxygen Activity Tolerance: Patient tolerated treatment well Patient left: in chair;with call bell/phone within reach Nurse Communication: Mobility status PT Visit Diagnosis: Other abnormalities of gait and mobility (R26.89)     Time: 1610-9604 PT Time Calculation (min) (ACUTE ONLY): 26 min  Charges:  $Gait Training: 8-22 mins $Therapeutic Exercise: 8-22 mins            Ivar Drape 04/22/2023, 3:58 PM  Samul Dada, PT PhD Acute Rehab Dept. Number: [facility] [id number] and [facility] [phone]

## 2023-04-22 NOTE — Progress Notes (Signed)
PROGRESS NOTE    Arthur Church  WUJ:[id number] DOB: 01-Oct-1956 DOA: 04/18/2023 PCP: Virgilio Belling, PA-C   Brief Narrative: Arthur Church is a 67 y.o. male with a history of heart failure with preserved EF, paroxysmal atrial fibrillation, prediabetes, CAD, morbid obesity, OSA, chronic nocturnal hypoxia, COPD, nonischemic cardiomyopathy.  Patient presented secondary to progressively worsening shortness of breath with productive cough and found to have associated hypoxia.  Concern for acute heart failure on admission and patient was started on IV Lasix diuresis.  Patient also found to have likely pneumonia will start empiric antibiotics.   Assessment and Plan:  Acute on chronic diastolic heart failure Present on admission. Associated dyspnea and hypoxia. Per patient, likely dry weight is about 320 lbs. Weight on admission of 341.1 lbs, down to 336.86 lbs with IV diuresis. Transthoracic Echocardiogram significant for preserved EF with grade 2 diastolic dysfunction. -Cardiology recommendations: IV Lasix diuresis  Community acquired pneumonia Noted prior to admission with imaging this admission confirming persistent evidence of infection. Patient started on empiric ceftriaxone and azithromycin and was transition to Augmentin for treatment. Procalcitonin negative. -Continue Augmentin  Hemoptysis Episode prior to admission. Recurrent episode while on Eliquis. Possibly related to pneumonia. Repeat CBC shows continued stability of hemoglobin. -Will discuss with pulmonology  Acute on chronic respiratory failure with hypoxia Patient uses 2-3 L/min of oxygen at night but is now requiring oxygen during the day secondary to acute heart failure. -Wean oxygen to room air as able.  Paroxysmal atrial fibrillation Patient is s/p ablation and currently in sinus rhythm. Patient is  also on Eliquis. -Continue Coreg and Eliquis  CAD S/p CABG. No current chest pain. -Continue Coreg, aspirin and  Lipitor  Anxiety -Continue Xanax PRN  Hyperlipidemia -Continue Lipitor  Emphysema -Continue Breo Ellipta  GERD -Continue Protonix  OSA Not on CPAP.  Morbid obesity Estimated body mass index is 44.01 kg/m as calculated from the following:   Height as of this encounter: 6\' 1"  (1.854 m).   Weight as of this encounter: 151.3 kg.  DVT prophylaxis: Eliquis Code Status:   Code Status: Full Code Family Communication: None Disposition Plan: Discharge home likely in 1-2 days pending ongoing cardiology recommendations for diuresis.   Consultants:  Cardiology Pulmonology  Procedures:  5/6: Transthoracic Echocardiogram   Antimicrobials: None    Subjective: No specific concerns today, although he did cough up more blood.  Objective: BP (!) 141/82 (BP Location: Left Arm)   Pulse 74   Temp 98.3 F (36.8 C)   Resp 16   Ht 6\' 1"  (1.854 m)   Wt (!) 151.3 kg   SpO2 92%   BMI 44.01 kg/m   Examination:  General exam: Appears calm and comfortable Respiratory system: Diminished but no rales heard. Respiratory effort normal. Cardiovascular system: S1 & S2 heard, RRR.  Gastrointestinal system: Abdomen is nondistended, soft and nontender. No organomegaly or masses felt. Normal bowel sounds heard. Central nervous system: Alert and oriented. No focal neurological deficits. Musculoskeletal: BLE pitting edema. No calf tenderness Skin: No cyanosis. No rashes Psychiatry: Judgement and insight appear normal. Mood & affect appropriate.    Data Reviewed: I have personally reviewed following labs and imaging studies  CBC Lab Results  Component Value Date   WBC 9.0 04/21/2023   RBC 4.85 04/21/2023   HGB 13.0 04/21/2023   HCT 41.0 04/21/2023   MCV 84.5 04/21/2023   MCH 26.8 04/21/2023   PLT 241 04/21/2023   MCHC 31.7 04/21/2023   RDW 16.2 (  H) 04/21/2023   LYMPHSABS 2.3 04/18/2023   MONOABS 0.9 04/18/2023   EOSABS 0.2 04/18/2023   BASOSABS 0.1 04/18/2023     Last  metabolic panel Lab Results  Component Value Date   NA 137 04/21/2023   K 3.8 04/21/2023   CL 97 (L) 04/21/2023   CO2 31 04/21/2023   BUN 21 04/21/2023   CREATININE 0.90 04/21/2023   GLUCOSE 113 (H) 04/21/2023   GFRNONAA >60 04/21/2023   GFRAA 118 03/07/2020   CALCIUM 9.0 04/21/2023   PHOS 4.2 04/19/2023   PROT 6.8 05/21/2022   ALBUMIN 4.3 05/21/2022   LABGLOB 2.5 05/21/2022   AGRATIO 1.7 05/21/2022   BILITOT 0.7 05/21/2022   ALKPHOS 78 05/21/2022   AST 15 05/21/2022   ALT 10 05/21/2022   ANIONGAP 9 04/21/2023    GFR: Estimated Creatinine Clearance: 125.6 mL/min (by C-G formula based on SCr of 0.9 mg/dL).  No results found for this or any previous visit (from the past 240 hour(s)).    Radiology Studies: No results found.    LOS: 3 days    Jacquelin Hawking, MD [facility] 04/22/2023, 10:06 AM   If 7PM-7AM, please contact night-coverage [email or web address]

## 2023-04-22 NOTE — Progress Notes (Signed)
   Heart Failure Stewardship Pharmacist Progress Note   PCP: Virgilio Belling, PA-C PCP-Cardiologist: Chrystie Nose, MD    HPI:  67 yo M with a PMH of HTN, CHF, CAD, afib, and prediabetes.   Presented to the ED on 5/5 with shortness of breath, 20 lb weight gain, and LE edema despite taking additional lasix at home.  CXR with new right lateral basilar airspace opacity. CTA negative for PE, also with mild cardiomegaly and multi lobar PNA. ECHO 5/6 showed LVEF 50% (was 45-50% during CABG in 2023), global hypokinesis, G2DD, and RV mildly reduced.   Current HF Medications: Diuretic: furosemide 60 mg IV BID Beta Blocker: carvedilol 3.125 mg BID ACE/ARB/ARNI: losartan 12.5 mg daily MRA: spironolactone 12.5 mg daily  Prior to admission HF Medications: Diuretic: furosemide 40 mg daily Beta blocker: carvedilol 12.5 mg BID  Pertinent Lab Values: Serum creatinine 0.74, BUN 16, Potassium 3.8, Sodium 138, BNP 355.4, Magnesium 1.9, A1c 6.9  Vital Signs: Weight: 333 lbs (admission weight: 345 lbs) Blood pressure: 120-130/60s  Heart rate: 50s  I/O: -6.5L yesterday; net -13.3L  Medication Assistance / Insurance Benefits Check: Does the patient have prescription insurance?  Yes Type of insurance plan: [employer]  Outpatient Pharmacy:  Prior to admission outpatient pharmacy: [facility] Is the patient willing to use [facility] TOC pharmacy at discharge? Yes Is the patient willing to transition their outpatient pharmacy to utilize a [facility] outpatient pharmacy?   Pending    Assessment: 1. Acute on chronic systolic and diastolic CHF (LVEF 50%), due to NICM. NYHA class III symptoms. - Continue furosemide 60 mg IV BID. Strict I/Os and daily weights. Keep K>4 and Mg>2. - Continue carvedilol 3.125 mg BID - Continue losartan 12.5 mg daily - Can escalate to Entresto pending BP and K. Has been taken off Entresto in the past due to hyperkalemia.  - Continue spironolactone 12.5 mg daily - Noted  prior intolerance to Jardiance   Plan: 1) Medication changes recommended at this time: - Continue current regimen; monitor K  2) Patient assistance: - Entresto copay $11.20 - Farxiga/Jardiance copay $11.20  3)  Education  - To be completed prior to discharge  Sharen Hones, PharmD, BCPS Heart Failure Stewardship Pharmacist Phone [phone]

## 2023-04-22 NOTE — Consult Note (Signed)
NAME:  Arthur Church, MRN:  [id number], DOB:  1956/02/11, LOS: 3 ADMISSION DATE:  04/18/2023, CONSULTATION DATE:  5/9 REFERRING MD:  Dr. Caleb Popp, CHIEF COMPLAINT:  Hemoptysis   History of Present Illness:  67 year old male with past medical history as below, which is significant for heart failure with reduced ejection fraction, coronary artery disease status post CABG x 2 in 2023, alcohol use, hypertension, and atrial fibrillation on Eliquis and is status post cardioversion.  He presented to [facility] emergency department on 5/5 with complaints of shortness of breath and a 20 pound weight gain and associated lower extremity edema.  He has been requiring oxygen around-the-clock at home which generally only uses it at night for sleep apnea.  Also complaining of mild hemoptysis.  CT of the emergency department was concerning for multifocal pneumonia and he was admitted to the hospital service for treatment with IV antibiotics.  Considering 20 pound weight gain he was also treated with diuretics.  His Eliquis was held in setting of hemoptysis and hemoptysis did resolve.  Clinically had been improving with antibiotics and diuretics.  He was restarted on Eliquis 5/8 and on 5/9 hemoptysis returned.  PCCM was consulted for evaluation of hemoptysis.  Pertinent  Medical History   has a past medical history of Chronic systolic CHF (congestive heart failure) (HCC), Coronary artery disease, Elevated TSH, ETOH abuse, Hypertension, Morbid obesity (HCC), Persistent atrial fibrillation (HCC), and Pre-diabetes.   Significant Hospital Events: Including procedures, antibiotic start and stop dates in addition to other pertinent events   5/5 admit for PNA/CHF/hemoptysis 5/8 Eliquis restarted 5/9 hemoptysis returned  Interim History / Subjective:    Objective   Blood pressure (!) 141/82, pulse 74, temperature 98.3 F (36.8 C), resp. rate 16, height 6\' 1"  (1.854 m), weight (!) 151.3 kg, SpO2 92 %.         Intake/Output Summary (Last 24 hours) at 04/22/2023 1337 Last data filed at 04/22/2023 1200 Gross per 24 hour  Intake 240 ml  Output 6200 ml  Net -5960 ml   Filed Weights   04/20/23 0836 04/21/23 0711 04/22/23 0500  Weight: (!) 154.7 kg (!) 152.8 kg (!) 151.3 kg    Examination: General: Obese male in mild resp distress after ambulating back from bathroom HENT: [place]/AT, PERRL, antiicteric  Lungs: Crackles in bilateral bases Cardiovascular: RRR, no MRG. Sinus on monitor Abdomen: Protuberant, soft, NT Extremities: No acute deformity. Lower extremity edema + Neuro: Alert, oriented, non-focal   Resolved Hospital Problem list     Assessment & Plan:   Hemoptysis: patient describes frank blood expectorated 5/2. Eliquis was held starting 5/5. No additional frank blood since then. Eliquis resumed 5/8 and he has since had what he describes as old dark blood expectorated x 1. Then some very faint streaks of blood mixed with sputum.  - Ok to continue Eliquis at current dose - Monitor closely for any further frank hemoptysis, at which point we would likely need to hold Eliquis a little longer. Otherwise ok to continue monitor.   CAD s/p CABG PAF HFrEF - Management per cardiology  COPD without acute exacerbation - continue triple therapy with Trelegy or formulary alternative.   CAP - Augmentin per primary - Supplemental O2 as needed to keep sats 92- 99%  OSA: no longer requires CPAP - Supportive care - Nocturnal O2 per home regimen.  - F/u with primary sleep physician outpatient through [facility] who has recommended repeat PSG  Best Practice (right click and "Reselect all SmartList  Selections" daily)    Labs   CBC: Recent Labs  Lab 04/18/23 1938 04/19/23 0253 04/20/23 0150 04/21/23 0220 04/22/23 0921  WBC 8.9 8.9 11.6* 9.0 8.9  NEUTROABS 5.5  --   --   --   --   HGB 12.6* 13.6 12.5* 13.0 14.6  HCT 41.9 45.1 40.3 41.0 46.0  MCV 87.7 86.6 85.6 84.5 83.5  PLT 224 237 238 241  297    Basic Metabolic Panel: Recent Labs  Lab 04/18/23 1938 04/19/23 0253 04/20/23 0150 04/21/23 0220 04/22/23 0853  NA 137 137 137 137 138  K 4.4 4.2 3.9 3.8 3.8  CL 99 100 97* 97* 97*  CO2 27 28 33* 31 32  GLUCOSE 149* 222* 153* 113* 130*  BUN 22 19 18 21 16   CREATININE 0.83 0.71 0.77 0.90 0.74  CALCIUM 8.7* 8.9 9.0 9.0 9.4  MG  --  1.9  --   --   --   PHOS  --  4.2  --   --   --    GFR: Estimated Creatinine Clearance: 141.3 mL/min (by C-G formula based on SCr of 0.74 mg/dL). Recent Labs  Lab 04/19/23 0253 04/20/23 0150 04/21/23 0220 04/22/23 0921  PROCALCITON <0.10  --  <0.10  --   WBC 8.9 11.6* 9.0 8.9    Liver Function Tests: No results for input(s): "AST", "ALT", "ALKPHOS", "BILITOT", "PROT", "ALBUMIN" in the last 168 hours. No results for input(s): "LIPASE", "AMYLASE" in the last 168 hours. No results for input(s): "AMMONIA" in the last 168 hours.  ABG    Component Value Date/Time   PHART 7.416 03/25/2022 0556   PCO2ART 38.9 03/25/2022 0556   PO2ART 83 03/25/2022 0556   HCO3 25.1 03/25/2022 0556   TCO2 35 (H) 07/14/2022 1045   ACIDBASEDEF 1.0 03/24/2022 2118   O2SAT 64.7 03/30/2022 0516     Coagulation Profile: No results for input(s): "INR", "PROTIME" in the last 168 hours.  Cardiac Enzymes: No results for input(s): "CKTOTAL", "CKMB", "CKMBINDEX", "TROPONINI" in the last 168 hours.  HbA1C: Hgb A1c MFr Bld  Date/Time Value Ref Range Status  04/19/2023 02:48 AM 6.9 (H) 4.8 - 5.6 % Final    Comment:    (NOTE) Pre diabetes:          5.7%-6.4%  Diabetes:              >6.4%  Glycemic control for   <7.0% adults with diabetes   03/23/2022 05:05 AM 5.9 (H) 4.8 - 5.6 % Final    Comment:    (NOTE) Pre diabetes:          5.7%-6.4%  Diabetes:              >6.4%  Glycemic control for   <7.0% adults with diabetes     CBG: Recent Labs  Lab 04/21/23 1127 04/21/23 1657 04/21/23 2001 04/22/23 0744 04/22/23 1103  GLUCAP 124* 86 197* 110*  139*    Review of Systems:   Bolds are positive  Constitutional: weight loss, gain, night sweats, Fevers, chills, fatigue .  HEENT: headaches, Sore throat, sneezing, nasal congestion, post nasal drip, Difficulty swallowing, Tooth/dental problems, visual complaints visual changes, ear ache CV:  chest pain, radiates:,Orthopnea, PND, swelling in lower extremities, dizziness, palpitations, syncope.  GI  heartburn, indigestion, abdominal pain, nausea, vomiting, diarrhea, change in bowel habits, loss of appetite, bloody stools.  Resp: cough, productive: , hemoptysis, dyspnea, chest pain, pleuritic.  Skin: rash or itching or icterus GU: dysuria, change  in color of urine, urgency or frequency. flank pain, hematuria  MS: joint pain or swelling. decreased range of motion  Psych: change in mood or affect. depression or anxiety.  Neuro: difficulty with speech, weakness, numbness, ataxia    Past Medical History:  He,  has a past medical history of Chronic systolic CHF (congestive heart failure) (HCC), Coronary artery disease, Elevated TSH, ETOH abuse, Hypertension, Morbid obesity (HCC), Persistent atrial fibrillation (HCC), and Pre-diabetes.   Surgical History:   Past Surgical History:  Procedure Laterality Date   ATRIAL FIBRILLATION ABLATION N/A 01/28/2023   Procedure: ATRIAL FIBRILLATION ABLATION;  Surgeon: Regan Lemming, MD;  Location: [facility] INVASIVE CV LAB;  Service: Cardiovascular;  Laterality: N/A;   CARDIAC CATHETERIZATION N/A 04/26/2015   Procedure: Left Heart Cath and Coronary Angiography;  Surgeon: Lyn Records, MD;  Location: [facility] INVASIVE CV LAB;  Service: Cardiovascular;  Laterality: N/A;   CARDIOVERSION N/A 03/06/2015   Procedure: CARDIOVERSION;  Surgeon: Laurey Morale, MD;  Location: [facility] ENDOSCOPY;  Service: Cardiovascular;  Laterality: N/A;   CARDIOVERSION N/A 06/02/2022   Procedure: CARDIOVERSION;  Surgeon: Vesta Mixer, MD;  Location: [facility] ENDOSCOPY;  Service: Cardiovascular;   Laterality: N/A;   CARDIOVERSION N/A 07/14/2022   Procedure: CARDIOVERSION;  Surgeon: Thurmon Fair, MD;  Location: [facility] ENDOSCOPY;  Service: Cardiovascular;  Laterality: N/A;   CORONARY ANGIOGRAPHY N/A 03/18/2022   Procedure: CORONARY ANGIOGRAPHY;  Surgeon: Kathleene Hazel, MD;  Location: [facility] INVASIVE CV LAB;  Service: Cardiovascular;  Laterality: N/A;   CORONARY ARTERY BYPASS GRAFT N/A 03/24/2022   Procedure: CORONARY ARTERY BYPASS GRAFTING (CABG) TIMES TWO USING LEFT INTERNAL MAMMARY ARTERY AND ENDOSCOPICALLY HARVESTED RIGHT GREATER SAPHENOUS VEIN;  Surgeon: Lovett Sox, MD;  Location: [facility] OR;  Service: Open Heart Surgery;  Laterality: N/A;   ENDOVEIN HARVEST OF GREATER SAPHENOUS VEIN Right 03/24/2022   Procedure: ENDOVEIN HARVEST OF GREATER SAPHENOUS VEIN;  Surgeon: Lovett Sox, MD;  Location: [facility] OR;  Service: Open Heart Surgery;  Laterality: Right;   HERNIA REPAIR     LEFT HEART CATH N/A 03/16/2022   Procedure: Left Heart Cath;  Surgeon: [name], MD;  Location: [facility] INVASIVE CV LAB;  Service: Cardiovascular;  Laterality: N/A;   TEE WITHOUT CARDIOVERSION N/A 03/06/2015   Procedure: TRANSESOPHAGEAL ECHOCARDIOGRAM (TEE);  Surgeon: Laurey Morale, MD;  Location: [facility] ENDOSCOPY;  Service: Cardiovascular;  Laterality: N/A;   TEE WITHOUT CARDIOVERSION N/A 05/02/2015   Procedure: TRANSESOPHAGEAL ECHOCARDIOGRAM (TEE);  Surgeon: Lewayne Bunting, MD;  Location: [facility] ENDOSCOPY;  Service: Cardiovascular;  Laterality: N/A;   TEE WITHOUT CARDIOVERSION N/A 03/24/2022   Procedure: TRANSESOPHAGEAL ECHOCARDIOGRAM (TEE);  Surgeon: Lovett Sox, MD;  Location: [facility] OR;  Service: Open Heart Surgery;  Laterality: N/A;     Social History:   reports that he quit smoking about 10 years ago. His smoking use included cigarettes. He has never used smokeless tobacco. He reports that he does not currently use alcohol. He reports that he does not use drugs.   Family History:  His family history includes Breast  cancer in his mother; Heart attack in his maternal grandfather and mother; Sleep apnea in his brother and brother; Uterine cancer in his sister.   Allergies No Known Allergies   Home Medications  Prior to Admission medications   Medication Sig Start Date End Date Taking? Authorizing Provider  acetaminophen (TYLENOL) 500 MG tablet Take 500 mg by mouth every 6 (six) hours as needed for mild pain, moderate pain or headache.   Yes [provider]  ALPRAZolam (XANAX) 0.5 MG tablet TAKE 1 TABLET BY MOUTH THREE TIMES DAILYAS NEEDED FOR ANXIETY Patient taking differently: Take 0.5 mg by mouth daily as needed for anxiety. 08/04/19  Yes Monica Becton, MD  AMBULATORY NON FORMULARY MEDICATION Portable oxygen concentrator, 3 L [place].  Please fax to [facility] Patient taking differently: Inhale 2-3 L into the lungs every evening. 03/17/19  Yes Monica Becton, MD  amiodarone (PACERONE) 200 MG tablet TAKE 1 TABLET(200 MG) BY MOUTH EVERY MORNING Patient taking differently: Take 200 mg by mouth daily. 12/28/22  Yes Newman Nip, NP  apixaban (ELIQUIS) 5 MG TABS tablet Take 1 tablet (5 mg total) by mouth 2 (two) times daily. 02/27/19  Yes Hilty, Lisette Abu, MD  Artificial Tear Ointment (DRY EYES OP) Place 1 drop into both eyes 2 (two) times daily as needed (dry eyes).   Yes [provider]  aspirin EC 81 MG EC tablet Take 1 tablet (81 mg total) by mouth daily. Swallow whole. 04/02/22  Yes Barrett, Erin R, PA-C  atorvastatin (LIPITOR) 80 MG tablet Take 1 tablet (80 mg total) by mouth daily. 05/24/19  Yes Jodelle Gross, NP  carvedilol (COREG) 12.5 MG tablet TAKE 1 TABLET(12.5 MG) BY MOUTH TWICE DAILY Patient taking differently: Take 12.5 mg by mouth 2 (two) times daily. 03/30/23  Yes Fenton, Clint R, PA  diphenhydramine-acetaminophen (TYLENOL PM) 25-500 MG TABS tablet Take 1 tablet by mouth at bedtime as needed (sleep, pain).   Yes [provider]  furosemide (LASIX) 40 MG  tablet TAKE 1 TABLET BY MOUTH EVERY DAY. MAY TAKE ADDITIONAL TABLET FOR WEIGHT GAIN OF 3LBS OVER NIGHT OR 5 LBS GAIN IN 1 WEEK Patient taking differently: Take 40-80 mg by mouth See admin instructions. Take once daily, alternating 40mg  and 80mg  every other day. 03/31/23  Yes Monge, Petra Kuba, NP  hydrocortisone 2.5 % cream Apply 1 Application topically daily as needed (rash, itching, irritation).   Yes [provider]  loratadine (CLARITIN) 10 MG tablet Take 10 mg by mouth daily.   Yes [provider]  Magnesium 400 MG TABS Take 400 mg by mouth 2 (two) times a day. 07/14/19  Yes Hilty, Lisette Abu, MD  montelukast (SINGULAIR) 10 MG tablet Take 10 mg by mouth at bedtime. 01/14/22  Yes [provider]  OXYGEN Inhale 2-3 L into the lungs at bedtime.   Yes [provider]  pantoprazole (PROTONIX) 40 MG tablet Take 1 tablet (40 mg total) by mouth daily. 11/30/19  Yes Monica Becton, MD  potassium chloride (KLOR-CON) 10 MEQ tablet TAKE 1 TABLET(10 MEQ) BY MOUTH DAILY Patient taking differently: Take 10 mEq by mouth daily. 03/31/23  Yes Monge, Petra Kuba, NP  sildenafil (REVATIO) 20 MG tablet Take 2-3 tablets (40-60 mg total) by mouth as needed. Patient taking differently: Take 40-60 mg by mouth daily as needed (ED). 02/10/23  Yes Hilty, Lisette Abu, MD  TRELEGY ELLIPTA 100-62.5-25 MCG/ACT AEPB Inhale 1 puff into the lungs daily. 02/18/22  Yes [provider]     Critical care time:      Joneen Roach, AGACNP-BC [facility]  See Amion for personal pager PCCM on call pager [phone] until 7pm. Please call Elink 7p-7a. [phone]  04/22/2023 2:14 PM

## 2023-04-22 NOTE — Progress Notes (Signed)
DAILY PROGRESS NOTE   Patient Name: KENDAHL DENINO Date of Encounter: 04/22/2023 Cardiologist: Chrystie Nose, MD  Chief Complaint   No complaints  Patient Profile   JULEN TU is a 67 y.o. male with a hx of tobacco abuse, ETOH use, HTN, atrial flutter with ablation 01/2023, systolic HF, CM, COPD on home 02 at night, pre-diabetes, CAD with  CABG X 2 V who is being seen 04/20/2023 for the evaluation of acute CHF at the request of Dr Benjamine Mola   Subjective   Diuresed 7L overnight!! Now 12.5L negative. Creatinine remains normal at 0.9. Labs otherwise normal. Weight down to 151 kg (dry weight probably around 145 kg based on 2/28 OV)  Objective   Vitals:   04/21/23 1958 04/22/23 0500 04/22/23 0747 04/22/23 0824  BP: (!) 143/67  (!) 141/82   Pulse: (!) 57  (!) 58 74  Resp: 18  18 16   Temp: 98 F (36.7 C)  98.3 F (36.8 C)   TempSrc:      SpO2: 95%  91% 92%  Weight:  (!) 151.3 kg    Height:        Intake/Output Summary (Last 24 hours) at 04/22/2023 0858 Last data filed at 04/22/2023 0500 Gross per 24 hour  Intake --  Output 7000 ml  Net -7000 ml   Filed Weights   04/20/23 0836 04/21/23 0711 04/22/23 0500  Weight: (!) 154.7 kg (!) 152.8 kg (!) 151.3 kg    Physical Exam   General appearance: alert, no distress, and morbidly obese Neck: JVD - 2 cm above sternal notch, no carotid bruit, and thyroid not enlarged, symmetric, no tenderness/mass/nodules Lungs: diminished breath sounds bibasilar Heart: regular rate and rhythm Abdomen: soft, non-tender; bowel sounds normal; no masses,  no organomegaly and obese Extremities: edema 1+ bilateral edema Pulses: 2+ and symmetric Skin: Skin color, texture, turgor normal. No rashes or lesions Neurologic: Grossly normal Psych: Pleasant  Inpatient Medications    Scheduled Meds:  amiodarone  100 mg Oral Daily   amoxicillin-clavulanate  1 tablet Oral Q12H   apixaban  5 mg Oral BID   aspirin EC  81 mg Oral Daily   atorvastatin  80 mg  Oral Daily   carvedilol  3.125 mg Oral BID WC   fluticasone furoate-vilanterol  1 puff Inhalation Daily   And   umeclidinium bromide  1 puff Inhalation Daily   furosemide  60 mg Intravenous BID   insulin aspart  0-15 Units Subcutaneous TID WC   insulin aspart  0-5 Units Subcutaneous QHS   losartan  12.5 mg Oral Daily   montelukast  10 mg Oral QHS   pantoprazole  40 mg Oral Daily   spironolactone  12.5 mg Oral Daily    Continuous Infusions:   PRN Meds: acetaminophen, albuterol, ALPRAZolam, ipratropium-albuterol, melatonin, polyethylene glycol, prochlorperazine   Labs   Results for orders placed or performed during the hospital encounter of 04/18/23 (from the past 48 hour(s))  Glucose, capillary     Status: Abnormal   Collection Time: 04/20/23 11:18 AM  Result Value Ref Range   Glucose-Capillary 102 (H) 70 - 99 mg/dL    Comment: Glucose reference range applies only to samples taken after fasting for at least 8 hours.  Strep pneumoniae urinary antigen     Status: None   Collection Time: 04/20/23 11:25 AM  Result Value Ref Range   Strep Pneumo Urinary Antigen NEGATIVE NEGATIVE    Comment:  Infection due to S. pneumoniae cannot be absolutely ruled out since the antigen present may be below the detection limit of the test. Performed at [facility], [address], [place], [place] [address]   Glucose, capillary     Status: Abnormal   Collection Time: 04/20/23  4:11 PM  Result Value Ref Range   Glucose-Capillary 131 (H) 70 - 99 mg/dL    Comment: Glucose reference range applies only to samples taken after fasting for at least 8 hours.  Glucose, capillary     Status: Abnormal   Collection Time: 04/20/23  7:41 PM  Result Value Ref Range   Glucose-Capillary 141 (H) 70 - 99 mg/dL    Comment: Glucose reference range applies only to samples taken after fasting for at least 8 hours.  Procalcitonin     Status: None   Collection Time: 04/21/23  2:20 AM  Result Value  Ref Range   Procalcitonin <0.10 ng/mL    Comment:        Interpretation: PCT (Procalcitonin) <= 0.5 ng/mL: Systemic infection (sepsis) is not likely. Local bacterial infection is possible. (NOTE)       Sepsis PCT Algorithm           Lower Respiratory Tract                                      Infection PCT Algorithm    ----------------------------     ----------------------------         PCT < 0.25 ng/mL                PCT < 0.10 ng/mL          Strongly encourage             Strongly discourage   discontinuation of antibiotics    initiation of antibiotics    ----------------------------     -----------------------------       PCT 0.25 - 0.50 ng/mL            PCT 0.10 - 0.25 ng/mL               OR       >80% decrease in PCT            Discourage initiation of                                            antibiotics      Encourage discontinuation           of antibiotics    ----------------------------     -----------------------------         PCT >= 0.50 ng/mL              PCT 0.26 - 0.50 ng/mL               AND        <80% decrease in PCT             Encourage initiation of                                             antibiotics  Encourage continuation           of antibiotics    ----------------------------     -----------------------------        PCT >= 0.50 ng/mL                  PCT > 0.50 ng/mL               AND         increase in PCT                  Strongly encourage                                      initiation of antibiotics    Strongly encourage escalation           of antibiotics                                     -----------------------------                                           PCT <= 0.25 ng/mL                                                 OR                                        > 80% decrease in PCT                                      Discontinue / Do not initiate                                             antibiotics  Performed at [facility], [address] [place], [place] [address]   CBC     Status: Abnormal   Collection Time: 04/21/23  2:20 AM  Result Value Ref Range   WBC 9.0 4.0 - 10.5 K/uL   RBC 4.85 4.22 - 5.81 MIL/uL   Hemoglobin 13.0 13.0 - 17.0 g/dL   HCT 60.4 54.0 - 98.1 %   MCV 84.5 80.0 - 100.0 fL   MCH 26.8 26.0 - 34.0 pg   MCHC 31.7 30.0 - 36.0 g/dL   RDW 19.1 (H) 47.8 - 29.5 %   Platelets 241 150 - 400 K/uL   nRBC 0.0 0.0 - 0.2 %    Comment: Performed at [facility], [address] [place], [place] [address]  Basic metabolic panel     Status: Abnormal   Collection Time: 04/21/23  2:20 AM  Result Value Ref Range   Sodium 137 135 - 145 mmol/L   Potassium 3.8 3.5 - 5.1 mmol/L   Chloride 97 (L)  98 - 111 mmol/L   CO2 31 22 - 32 mmol/L   Glucose, Bld 113 (H) 70 - 99 mg/dL    Comment: Glucose reference range applies only to samples taken after fasting for at least 8 hours.   BUN 21 8 - 23 mg/dL   Creatinine, Ser 1.61 0.61 - 1.24 mg/dL   Calcium 9.0 8.9 - 09.6 mg/dL   GFR, Estimated >04 >54 mL/min    Comment: (NOTE) Calculated using the CKD-EPI Creatinine Equation (2021)    Anion gap 9 5 - 15    Comment: Performed at [facility], [address], [place], [place] [address]  Glucose, capillary     Status: Abnormal   Collection Time: 04/21/23  7:54 AM  Result Value Ref Range   Glucose-Capillary 113 (H) 70 - 99 mg/dL    Comment: Glucose reference range applies only to samples taken after fasting for at least 8 hours.  Glucose, capillary     Status: Abnormal   Collection Time: 04/21/23 11:27 AM  Result Value Ref Range   Glucose-Capillary 124 (H) 70 - 99 mg/dL    Comment: Glucose reference range applies only to samples taken after fasting for at least 8 hours.  Glucose, capillary     Status: None   Collection Time: 04/21/23  4:57 PM  Result Value Ref Range   Glucose-Capillary 86 70 - 99 mg/dL    Comment: Glucose reference range applies only to samples taken after fasting for  at least 8 hours.  Glucose, capillary     Status: Abnormal   Collection Time: 04/21/23  8:01 PM  Result Value Ref Range   Glucose-Capillary 197 (H) 70 - 99 mg/dL    Comment: Glucose reference range applies only to samples taken after fasting for at least 8 hours.  Glucose, capillary     Status: Abnormal   Collection Time: 04/22/23  7:44 AM  Result Value Ref Range   Glucose-Capillary 110 (H) 70 - 99 mg/dL    Comment: Glucose reference range applies only to samples taken after fasting for at least 8 hours.    ECG   N/A  Telemetry   Sinus rhythm with PAC's - Personally Reviewed  Radiology    No results found.  Cardiac Studies   See echo above  Assessment   Principal Problem:   CAP (community acquired pneumonia) Active Problems:   Morbid obesity (HCC)   PAF (paroxysmal atrial fibrillation) (HCC)   Essential hypertension   Diabetes mellitus type 2, diet-controlled (HCC)   Plan   Continues to diurese well - now probably about 5 kg above dry weight, but will likely reach that tomorrow. Creatinine and potassium relatively stable, despite aggressive diuresis. Continue IV lasix today - may transition to po tomorrow if output similar. He told me today that Jardiance made him sick in the past, so we will avoid it. Will not take Entresto d/t hyperkalemia. Close to d/c from a cardiac standpoint, possibly tomorrow.   Time Spent Directly with Patient:  I have spent a total of 25 minutes with the patient reviewing hospital notes, telemetry, EKGs, labs and examining the patient as well as establishing an assessment and plan that was discussed personally with the patient.  > 50% of time was spent in direct patient care.  Length of Stay:  LOS: 3 days   Chrystie Nose, MD, [facility], FACP  [place]  [facility]  Medical Director of the Advanced Lipid Disorders &  Cardiovascular Risk Reduction Clinic  Diplomate of the [employer] of Clinical Lipidology Attending Cardiologist   Direct Dial: [phone]  Fax: [phone]  Website:  [email or web address] 04/22/2023, 8:58 AM

## 2023-04-23 ENCOUNTER — Other Ambulatory Visit (HOSPITAL_COMMUNITY): Payer: Self-pay

## 2023-04-23 DIAGNOSIS — I480 Paroxysmal atrial fibrillation: Secondary | ICD-10-CM | POA: Diagnosis not present

## 2023-04-23 DIAGNOSIS — I5022 Chronic systolic (congestive) heart failure: Secondary | ICD-10-CM | POA: Diagnosis not present

## 2023-04-23 DIAGNOSIS — J189 Pneumonia, unspecified organism: Secondary | ICD-10-CM | POA: Diagnosis not present

## 2023-04-23 DIAGNOSIS — I5023 Acute on chronic systolic (congestive) heart failure: Secondary | ICD-10-CM

## 2023-04-23 DIAGNOSIS — I5033 Acute on chronic diastolic (congestive) heart failure: Secondary | ICD-10-CM | POA: Diagnosis not present

## 2023-04-23 LAB — BASIC METABOLIC PANEL
Anion gap: 11 (ref 5–15)
BUN: 20 mg/dL (ref 8–23)
CO2: 30 mmol/L (ref 22–32)
Calcium: 9.2 mg/dL (ref 8.9–10.3)
Chloride: 96 mmol/L — ABNORMAL LOW (ref 98–111)
Creatinine, Ser: 0.89 mg/dL (ref 0.61–1.24)
GFR, Estimated: >60 mL/min (ref >60)
Glucose, Bld: 107 mg/dL — ABNORMAL HIGH (ref 70–99)
Potassium: 3.5 mmol/L (ref 3.5–5.1)
Sodium: 137 mmol/L (ref 135–145)

## 2023-04-23 LAB — GLUCOSE, CAPILLARY
Glucose-Capillary: 137 mg/dL — ABNORMAL HIGH (ref 70–99)
Glucose-Capillary: 136 mg/dL — ABNORMAL HIGH (ref 70–99)

## 2023-04-23 MED ORDER — AMOXICILLIN-POT CLAVULANATE 875-125 MG PO TABS
1.0000 | ORAL_TABLET | Freq: Two times a day (BID) | ORAL | 0 refills | Status: AC
  Filled 2023-04-23: qty 3, 2d supply, fill #0

## 2023-04-23 MED ORDER — LOSARTAN POTASSIUM 50 MG PO TABS: 25.0000 mg | ORAL_TABLET | Freq: Every day | ORAL | Status: DC

## 2023-04-23 MED ORDER — LOSARTAN POTASSIUM 25 MG PO TABS
25.0000 mg | ORAL_TABLET | Freq: Every day | ORAL | 2 refills | Status: DC
  Filled 2023-04-23: qty 30, 30d supply, fill #0

## 2023-04-23 MED ORDER — FUROSEMIDE 80 MG PO TABS
80.0000 mg | ORAL_TABLET | Freq: Every day | ORAL | 2 refills | Status: DC
  Filled 2023-04-23: qty 30, 30d supply, fill #0

## 2023-04-23 MED ORDER — SPIRONOLACTONE 25 MG PO TABS
12.5000 mg | ORAL_TABLET | Freq: Every day | ORAL | 2 refills | Status: DC
  Filled 2023-04-23: qty 15, 30d supply, fill #0

## 2023-04-23 MED ORDER — FUROSEMIDE 40 MG PO TABS: 80.0000 mg | ORAL_TABLET | Freq: Every day | ORAL | Status: DC

## 2023-04-23 NOTE — Discharge Summary (Signed)
Physician Discharge Summary   Patient: Arthur Church MRN: [id number] DOB: [birth year]  Admit date:     04/18/2023  Discharge date: 04/23/23  Discharge Physician: Jacquelin Hawking, MD   PCP: Virgilio Belling, PA-C   Recommendations at discharge:  Hospital follow-up with PCP Hospital follow-up with Cardiology  Discharge Diagnoses: Principal Problem:   CAP (community acquired pneumonia) Active Problems:   Morbid obesity (HCC)   PAF (paroxysmal atrial fibrillation) (HCC)   Essential hypertension   Diabetes mellitus type 2, diet-controlled (HCC)   Acute on chronic HFrEF (heart failure with reduced ejection fraction) (HCC)   Hemoptysis  Resolved Problems:   * No resolved hospital problems. *  Hospital Course: Arthur Church is a 67 y.o. male with a history of heart failure with preserved EF, paroxysmal atrial fibrillation, prediabetes, CAD, morbid obesity, OSA, chronic nocturnal hypoxia, COPD, nonischemic cardiomyopathy.  Patient presented secondary to progressively worsening shortness of breath with productive cough and found to have associated hypoxia.  Concern for acute heart failure on admission and patient was started on IV Lasix diuresis.  Patient also found to have likely pneumonia was started on empiric antibiotics.  Cardiology was consulted and prescribed aggressive Lasix IV diuresis.  Patient's weight on admission was about 154.7 kg down to 147.9 kg after diuresis.  Patient's home Lasix dose increased to 80 mg daily and patient started on spironolactone and losartan.  Patient to follow-up with cardiology as an outpatient.  Assessment and Plan:  Acute on chronic diastolic heart failure Present on admission. Associated dyspnea and hypoxia. Per patient, likely dry weight is about 320 lbs. Weight on admission of 341.1 lbs, down to 326 lbs with IV diuresis. Transthoracic Echocardiogram significant for preserved EF with grade 2 diastolic dysfunction.  Cardiology recommendations to  discharge on Lasix 80 mg daily, losartan, 25 daily patient to follow-up with cardiology as an outpatient.   Community acquired pneumonia Noted prior to admission with imaging this admission confirming persistent evidence of infection. Patient started on empiric ceftriaxone and azithromycin and was transition to Augmentin for treatment. Procalcitonin negative.  Continue Augmentin to complete a 5-day course.   Hemoptysis Episode prior to admission. Recurrent episode while on Eliquis. Possibly related to pneumonia. Repeat CBC shows continued stability of hemoglobin.  Pulmonology consulted and recommended to continue Eliquis as prescribed unless patient develops frank hemoptysis.   Acute on chronic respiratory failure with hypoxia Patient uses 2-3 L/min of oxygen at night but is now requiring oxygen during the day secondary to acute heart failure.  Prior to discharge, patient underwent ambulatory pulse ox test and found to require 4 L/min of oxygen with ambulation.   Paroxysmal atrial fibrillation Patient is s/p ablation and currently in sinus rhythm. Patient is  also on Eliquis. Continue Coreg and Eliquis.   CAD S/p CABG. No current chest pain. Continue Coreg, aspirin and Lipitor.   Anxiety Continue Xanax PRN.   Hyperlipidemia Continue Lipitor.   Emphysema Continue Breo Ellipta   GERD Continue Protonix   OSA Not on CPAP.   Morbid obesity Estimated body mass index is 43.01 kg/m as calculated from the following:   Height as of this encounter: 6\' 1"  (1.854 m).   Weight as of this encounter: 147.9 kg.    Consultants: Cardiology, Pulmonology Procedures performed:  5/6: Transthoracic Echocardiogram   Disposition: Home  Diet recommendation: Cardiac diet   DISCHARGE MEDICATION: Allergies as of 04/23/2023   No Known Allergies      Medication List     STOP  taking these medications    diphenhydramine-acetaminophen 25-500 MG Tabs tablet Commonly known as: TYLENOL PM    potassium chloride 10 MEQ tablet Commonly known as: KLOR-CON       TAKE these medications    acetaminophen 500 MG tablet Commonly known as: TYLENOL Take 500 mg by mouth every 6 (six) hours as needed for mild pain, moderate pain or headache.   ALPRAZolam 0.5 MG tablet Commonly known as: XANAX TAKE 1 TABLET BY MOUTH THREE TIMES DAILYAS NEEDED FOR ANXIETY What changed:  how much to take how to take this when to take this reasons to take this additional instructions   AMBULATORY NON FORMULARY MEDICATION Portable oxygen concentrator, 3 L [place].  Please fax to [facility] What changed:  how much to take how to take this when to take this additional instructions   amiodarone 200 MG tablet Commonly known as: PACERONE TAKE 1 TABLET(200 MG) BY MOUTH EVERY MORNING What changed: See the new instructions.   amoxicillin-clavulanate 875-125 MG tablet Commonly known as: AUGMENTIN Take 1 tablet by mouth 2 (two) times daily for 2 days.   apixaban 5 MG Tabs tablet Commonly known as: Eliquis Take 1 tablet (5 mg total) by mouth 2 (two) times daily.   aspirin EC 81 MG tablet Take 1 tablet (81 mg total) by mouth daily. Swallow whole.   atorvastatin 80 MG tablet Commonly known as: LIPITOR Take 1 tablet (80 mg total) by mouth daily.   carvedilol 12.5 MG tablet Commonly known as: COREG TAKE 1 TABLET(12.5 MG) BY MOUTH TWICE DAILY What changed: See the new instructions.   DRY EYES OP Place 1 drop into both eyes 2 (two) times daily as needed (dry eyes).   furosemide 80 MG tablet Commonly known as: LASIX Take 1 tablet (80 mg total) by mouth daily. Start taking on: Apr 24, 2023 What changed:  medication strength how much to take how to take this when to take this additional instructions   hydrocortisone 2.5 % cream Apply 1 Application topically daily as needed (rash, itching, irritation).   loratadine 10 MG tablet Commonly known as: CLARITIN Take 10 mg by mouth daily.    losartan 25 MG tablet Commonly known as: COZAAR Take 1 tablet (25 mg total) by mouth daily. Start taking on: Apr 24, 2023   Magnesium 400 MG Tabs Take 400 mg by mouth 2 (two) times a day.   montelukast 10 MG tablet Commonly known as: SINGULAIR Take 10 mg by mouth at bedtime.   OXYGEN Inhale 2-3 L into the lungs at bedtime.   pantoprazole 40 MG tablet Commonly known as: PROTONIX Take 1 tablet (40 mg total) by mouth daily.   sildenafil 20 MG tablet Commonly known as: REVATIO Take 2-3 tablets (40-60 mg total) by mouth as needed. What changed:  when to take this reasons to take this   spironolactone 25 MG tablet Commonly known as: ALDACTONE Take 0.5 tablets (12.5 mg total) by mouth daily. Start taking on: Apr 24, 2023   Trelegy Ellipta 100-62.5-25 MCG/ACT Aepb Generic drug: Fluticasone-Umeclidin-Vilant Inhale 1 puff into the lungs daily.               Durable Medical Equipment  (From admission, onward)           Start     Ordered   04/23/23 1404  For home use only DME oxygen  Once       Question Answer Comment  Length of Need Lifetime   Mode or (Route) Nasal  cannula   Liters per Minute 4   Frequency Continuous (stationary and portable oxygen unit needed)   Oxygen delivery system Gas      04/23/23 1403            Follow-up Information     [facility] Follow up.   Specialty: Cardiology Contact information: [address] [id number] Wilhemina Bonito [place] [address] [phone]         [facility] Follow up in 38 day(s).   Specialty: Cardiology Why: Hospital follow up 05/31/2023 @ 11 am PLEASE bring a current medication list to appointment FREE valet parking, Entrance C, off [employer] information: [address] [id number] [place] [address] [phone]        Virgilio Belling, [place]. Schedule an  appointment as soon as possible for a visit in 1 week(s).   Specialty: Physician Assistant Why: For hospital follow-up Contact information: [address] [place] [address] [phone]                Discharge Exam: BP 137/72 (BP Location: Right Arm)   Pulse 64   Temp 97.8 F (36.6 C) (Oral)   Resp 20   Ht 6\' 1"  (1.854 m)   Wt (!) 147.9 kg   SpO2 95%   BMI 43.01 kg/m   General exam: Appears calm and comfortable Respiratory system: Clear to auscultation. Respiratory effort normal. Cardiovascular system: S1 & S2 heard, RRR. No murmurs, rubs, gallops or clicks. Gastrointestinal system: Abdomen is soft and nontender. Normal bowel sounds heard. Central nervous system: Alert and oriented. No focal neurological deficits. Musculoskeletal: BLE edema. No calf tenderness Skin: No cyanosis. No rashes Psychiatry: Judgement and insight appear normal. Mood & affect appropriate.   Condition at discharge: stable  The results of significant diagnostics from this hospitalization (including imaging, microbiology, ancillary and laboratory) are listed below for reference.   Imaging Studies: ECHOCARDIOGRAM COMPLETE  Result Date: 04/19/2023    ECHOCARDIOGRAM REPORT   Patient Name:   Arthur Church Date of Exam: 04/19/2023 Medical Rec #:  [id number]      Height:       73.0 in Accession #:    [id number]     Weight:       345.0 lb Date of Birth:  November 11, 1956      BSA:          2.713 m Patient Age:    66 years       BP:           135/65 mmHg Patient Gender: M              HR:           62 bpm. Exam Location:  Inpatient Procedure: 2D Echo, Color Doppler, Cardiac Doppler and Intracardiac            Opacification Agent Indications:    Aute Diastolic CHF  History:        Patient has prior history of Echocardiogram examinations, most                 recent 03/14/2022. CHF, Arrythmias:Atrial Fibrillation,                 Signs/Symptoms:Shortness of Breath; Risk Factors:Hypertension                  and Diabetes.  Sonographer:    Raeford Razor Sonographer#2:  Efraim Kaufmann  Kafa Referring Phys: Dow Adolph, N  Sonographer Comments: Technically difficult study due to poor echo windows. Image acquisition challenging due to patient body habitus. IMPRESSIONS  1. Left ventricular ejection fraction, by estimation, is 50%. The left ventricle has mildly decreased function. The left ventricle demonstrates global hypokinesis. Left ventricular diastolic parameters are consistent with Grade II diastolic dysfunction (pseudonormalization).  2. Right ventricular systolic function is mildly reduced. The right ventricular size is normal. Tricuspid regurgitation signal is inadequate for assessing PA pressure.  3. Left atrial size was mild to moderately dilated.  4. The mitral valve is abnormal. Mild mitral valve regurgitation (MR not fully visualized, could be worse). No evidence of mitral stenosis.  5. The aortic valve is tricuspid. Aortic valve regurgitation is not visualized. No aortic stenosis is present.  6. The inferior vena cava is dilated in size with <50% respiratory variability, suggesting right atrial pressure of 15 mmHg.  7. Technically difficult study with very poor images. FINDINGS  Left Ventricle: Left ventricular ejection fraction, by estimation, is 50%. The left ventricle has mildly decreased function. The left ventricle demonstrates global hypokinesis. Definity contrast agent was given IV to delineate the left ventricular endocardial borders. The left ventricular internal cavity size was normal in size. There is no left ventricular hypertrophy. Left ventricular diastolic parameters are consistent with Grade II diastolic dysfunction (pseudonormalization). Right Ventricle: The right ventricular size is normal. No increase in right ventricular wall thickness. Right ventricular systolic function is mildly reduced. Tricuspid regurgitation signal is inadequate for assessing PA pressure. Left Atrium: Left atrial size was mild  to moderately dilated. Right Atrium: Right atrial size was normal in size. Pericardium: There is no evidence of pericardial effusion. Mitral Valve: The mitral valve is abnormal. Mild mitral valve regurgitation. No evidence of mitral valve stenosis. Tricuspid Valve: The tricuspid valve is normal in structure. Tricuspid valve regurgitation is trivial. Aortic Valve: The aortic valve is tricuspid. Aortic valve regurgitation is not visualized. No aortic stenosis is present. Aortic valve mean gradient measures 3.0 mmHg. Aortic valve peak gradient measures 6.2 mmHg. Aortic valve area, by VTI measures 2.26 cm. Pulmonic Valve: The pulmonic valve was normal in structure. Pulmonic valve regurgitation is not visualized. Aorta: The aortic root is normal in size and structure. Venous: The inferior vena cava is dilated in size with less than 50% respiratory variability, suggesting right atrial pressure of 15 mmHg. IAS/Shunts: No atrial level shunt detected by color flow Doppler.  LEFT VENTRICLE PLAX 2D LVIDd:         6.00 cm     Diastology LVIDs:         4.40 cm     LV e' medial:    5.11 cm/s LV PW:         1.30 cm     LV E/e' medial:  26.4 LV IVS:        1.40 cm     LV e' lateral:   6.41 cm/s LVOT diam:     2.20 cm     LV E/e' lateral: 21.1 LV SV:         60 LV SV Index:   22 LVOT Area:     3.80 cm  LV Volumes (MOD) LV vol d, MOD A4C: 95.6 ml LV vol s, MOD A4C: 47.2 ml LV SV MOD A4C:     95.6 ml RIGHT VENTRICLE             IVC RV S prime:     10.90 cm/s  IVC diam: 3.20 cm TAPSE (M-mode): 2.4 cm LEFT ATRIUM            Index LA diam:      5.80 cm  2.14 cm/m LA Vol (A4C): 111.0 ml 40.92 ml/m  AORTIC VALVE AV Area (Vmax):    2.35 cm AV Area (Vmean):   2.26 cm AV Area (VTI):     2.26 cm AV Vmax:           124.00 cm/s AV Vmean:          81.000 cm/s AV VTI:            0.264 m AV Peak Grad:      6.2 mmHg AV Mean Grad:      3.0 mmHg LVOT Vmax:         76.50 cm/s LVOT Vmean:        48.200 cm/s LVOT VTI:          0.157 m LVOT/AV VTI  ratio: 0.59  AORTA Ao Root diam: 3.60 cm MITRAL VALVE MV Area (PHT): 4.21 cm     SHUNTS MV Decel Time: 180 msec     Systemic VTI:  0.16 m MR Peak grad: 36.5 mmHg     Systemic Diam: 2.20 cm MR Vmax:      302.00 cm/s MV E velocity: 135.00 cm/s MV A velocity: 48.20 cm/s MV E/A ratio:  2.80 Dalton McleanMD Electronically signed by Wilfred Lacy Signature Date/Time: 04/19/2023/10:47:18 AM    Final    CT Angio Chest PE W/Cm &/Or Wo Cm  Result Date: 04/19/2023 CLINICAL DATA:  Concern for pulmonary embolism. EXAM: CT ANGIOGRAPHY CHEST WITH CONTRAST TECHNIQUE: Multidetector CT imaging of the chest was performed using the standard protocol during bolus administration of intravenous contrast. Multiplanar CT image reconstructions and MIPs were obtained to evaluate the vascular anatomy. RADIATION DOSE REDUCTION: This exam was performed according to the departmental dose-optimization program which includes automated exposure control, adjustment of the mA and/or kV according to patient size and/or use of iterative reconstruction technique. CONTRAST:  OMNIPAQUE IOHEXOL 350 MG/ML SOLN COMPARISON:  Chest radiograph dated 04/18/2023. FINDINGS: Evaluation of this exam is limited due to respiratory motion artifact. Cardiovascular: There is mild cardiomegaly. No pericardial effusion. Three-vessel coronary vascular calcification. There is moderate atherosclerotic calcification of the thoracic aorta. No aneurysmal dilatation or dissection. Evaluation of the pulmonary arteries is limited due to respiratory motion. No central pulmonary artery embolus identified the Mediastinum/Nodes: No hilar or mediastinal adenopathy. The esophagus is grossly unremarkable. No mediastinal fluid collection. Lungs/Pleura: Areas of airspace opacity involving right middle lobe, bilateral lower lobes, and to a lesser degree right upper lobe most consistent with multi lobar pneumonia. Clinical correlation and follow-up to resolution recommended. No  pleural effusion or pneumothorax. The central airways are patent. Upper Abdomen: No acute abnormality. Musculoskeletal: Degenerative changes of the spine. Median sternotomy wires. No acute osseous pathology. Review of the MIP images confirms the above findings. IMPRESSION: 1. No CT evidence of central pulmonary artery embolus. 2. Multi lobar pneumonia. Clinical correlation and follow-up to resolution recommended. 3. Mild cardiomegaly with three-vessel coronary vascular calcification. 4.  Aortic Atherosclerosis (ICD10-I70.0). Electronically Signed   By: Elgie Collard M.D.   On: 04/19/2023 00:57   DG Chest 2 View  Result Date: 04/18/2023 CLINICAL DATA:  SOB EXAM: CHEST - 2 VIEW COMPARISON:  May 18, 2022 FINDINGS: The cardiomediastinal silhouette is unchanged in contour.Status post median sternotomy and CABG. No pleural effusion. No pneumothorax. New RIGHT lateral basilar  airspace opacity. LEFT basilar platelike opacities, likely atelectasis. Visualized abdomen is unremarkable. Multilevel degenerative changes of the thoracic spine. IMPRESSION: New RIGHT lateral basilar airspace opacity. Differential considerations include infection, aspiration or other inflammatory process. Follow-up PA and lateral chest radiograph 4-6 weeks after appropriate treatment would be recommended to exclude underlying neoplasm Electronically Signed   By: Meda Klinefelter M.D.   On: 04/18/2023 20:18    Microbiology: Results for orders placed or performed during the hospital encounter of 04/18/23  Respiratory (~20 pathogens) panel by PCR     Status: None   Collection Time: 04/22/23  9:25 AM   Specimen: Nasopharyngeal Swab; Respiratory  Result Value Ref Range Status   Adenovirus NOT DETECTED NOT DETECTED Final   Coronavirus 229E NOT DETECTED NOT DETECTED Final    Comment: (NOTE) The Coronavirus on the Respiratory Panel, DOES NOT test for the novel  Coronavirus (2019 nCoV)    Coronavirus HKU1 NOT DETECTED NOT DETECTED Final    Coronavirus NL63 NOT DETECTED NOT DETECTED Final   Coronavirus OC43 NOT DETECTED NOT DETECTED Final   Metapneumovirus NOT DETECTED NOT DETECTED Final   Rhinovirus / Enterovirus NOT DETECTED NOT DETECTED Final   Influenza A NOT DETECTED NOT DETECTED Final   Influenza B NOT DETECTED NOT DETECTED Final   Parainfluenza Virus 1 NOT DETECTED NOT DETECTED Final   Parainfluenza Virus 2 NOT DETECTED NOT DETECTED Final   Parainfluenza Virus 3 NOT DETECTED NOT DETECTED Final   Parainfluenza Virus 4 NOT DETECTED NOT DETECTED Final   Respiratory Syncytial Virus NOT DETECTED NOT DETECTED Final   Bordetella pertussis NOT DETECTED NOT DETECTED Final   Bordetella Parapertussis NOT DETECTED NOT DETECTED Final   Chlamydophila pneumoniae NOT DETECTED NOT DETECTED Final   Mycoplasma pneumoniae NOT DETECTED NOT DETECTED Final    Comment: Performed at [facility], [address], [place], [place] [address]    Labs: CBC: Recent Labs  Lab 04/18/23 1938 04/19/23 0253 04/20/23 0150 04/21/23 0220 04/22/23 0921  WBC 8.9 8.9 11.6* 9.0 8.9  NEUTROABS 5.5  --   --   --   --   HGB 12.6* 13.6 12.5* 13.0 14.6  HCT 41.9 45.1 40.3 41.0 46.0  MCV 87.7 86.6 85.6 84.5 83.5  PLT 224 237 238 241 297   Basic Metabolic Panel: Recent Labs  Lab 04/19/23 0253 04/20/23 0150 04/21/23 0220 04/22/23 0853 04/23/23 0337  NA 137 137 137 138 137  K 4.2 3.9 3.8 3.8 3.5  CL 100 97* 97* 97* 96*  CO2 28 33* 31 32 30  GLUCOSE 222* 153* 113* 130* 107*  BUN 19 18 21 16 20   CREATININE 0.71 0.77 0.90 0.74 0.89  CALCIUM 8.9 9.0 9.0 9.4 9.2  MG 1.9  --   --   --   --   PHOS 4.2  --   --   --   --      CBG: Recent Labs  Lab 04/22/23 1103 04/22/23 1559 04/22/23 2147 04/23/23 0801 04/23/23 1125  GLUCAP 139* 114* 128* 137* 136*    Discharge time spent: 35 minutes.  Signed: Jacquelin Hawking, MD [facility] 04/23/2023

## 2023-04-23 NOTE — Progress Notes (Addendum)
SATURATION QUALIFICATIONS: (This note is used to comply with regulatory documentation for home oxygen)  Patient Saturations on Room Air at Rest = 96%  Patient Saturations on Room Air while Ambulating = 86%  Patient Saturations on 4 Liters of oxygen while Ambulating = 97%  Please briefly explain why patient needs home oxygen:  Patient desat while ambulating and took up to 4 liters to recover from 86% to 92% in bout 1 minute. Pt exhibited s/s of SOB dep breathing and stopping to rest.

## 2023-04-23 NOTE — Progress Notes (Signed)
NAME:  Arthur Church, MRN:  [id number], DOB:  1956/01/10, LOS: 4 ADMISSION DATE:  04/18/2023, CONSULTATION DATE:  5/9 REFERRING MD:  Dr. Caleb Popp, CHIEF COMPLAINT:  Hemoptysis   History of Present Illness:  67 year old male with past medical history as below, which is significant for heart failure with reduced ejection fraction, coronary artery disease status post CABG x 2 in 2023, alcohol use, hypertension, and atrial fibrillation on Eliquis and is status post cardioversion.  He presented to [facility] emergency department on 5/5 with complaints of shortness of breath and a 20 pound weight gain and associated lower extremity edema.  He has been requiring oxygen around-the-clock at home which generally only uses it at night for sleep apnea.  Also complaining of mild hemoptysis.  CT of the emergency department was concerning for multifocal pneumonia and he was admitted to the hospital service for treatment with IV antibiotics.  Considering 20 pound weight gain he was also treated with diuretics.  His Eliquis was held in setting of hemoptysis and hemoptysis did resolve.  Clinically had been improving with antibiotics and diuretics.  He was restarted on Eliquis 5/8 and on 5/9 hemoptysis returned.  PCCM was consulted for evaluation of hemoptysis.  Pertinent  Medical History   has a past medical history of Chronic systolic CHF (congestive heart failure) (HCC), Coronary artery disease, Elevated TSH, ETOH abuse, Hypertension, Morbid obesity (HCC), Persistent atrial fibrillation (HCC), and Pre-diabetes.   Significant Hospital Events: Including procedures, antibiotic start and stop dates in addition to other pertinent events   5/5 admit for PNA/CHF/hemoptysis 5/8 Eliquis restarted 5/9 hemoptysis returned No reported hemoptysis 04/23/23  Interim History / Subjective:  Reports no significant change in the way feels  Objective   Blood pressure (!) 143/84, pulse 64, temperature 98 F (36.7 C), temperature  source Oral, resp. rate 18, height 6\' 1"  (1.854 m), weight (!) 151.3 kg, SpO2 91 %.        Intake/Output Summary (Last 24 hours) at 04/23/2023 4098 Last data filed at 04/22/2023 2100 Gross per 24 hour  Intake 840 ml  Output 3000 ml  Net -2160 ml   Filed Weights   04/20/23 0836 04/21/23 0711 04/22/23 0500  Weight: (!) 154.7 kg (!) 152.8 kg (!) 151.3 kg    Examination: Morbidly obese male sitting in chair no acute distress No JVD or lymphadenopathy is appreciated Heart sounds are distant Decreased breath sounds  in the bases Heart sounds are distant 2+ lower extremity pitting edema   Resolved Hospital Problem list     Assessment & Plan:   Hemoptysis: patient describes frank blood expectorated 5/2. Eliquis was held starting 5/5. No additional frank blood since then. Eliquis resumed 5/8 and he has since had what he describes as old dark blood expectorated x 1. Then some very faint streaks of blood mixed with sputum.   Continue anticoagulation with Eliquis Monitor for any further hemoptysis.  No reports of hemoptysis at this time     CAD s/p CABG PAF HFrEF Per cardiology  COPD without acute exacerbation Continue current triple therapy with Trelegy  CAP Continue Augmentin per primary O2 as needed currently on 2 L with sats of 93%   - A  OSA: no longer requires CPAP Reportedly no longer require CPAP Continued to interface with pulmonary  Best Practice (right click and "Reselect all SmartList Selections" daily)    Labs   CBC: Recent Labs  Lab 04/18/23 1938 04/19/23 0253 04/20/23 0150 04/21/23 0220 04/22/23 0921  WBC  8.9 8.9 11.6* 9.0 8.9  NEUTROABS 5.5  --   --   --   --   HGB 12.6* 13.6 12.5* 13.0 14.6  HCT 41.9 45.1 40.3 41.0 46.0  MCV 87.7 86.6 85.6 84.5 83.5  PLT 224 237 238 241 297    Basic Metabolic Panel: Recent Labs  Lab 04/19/23 0253 04/20/23 0150 04/21/23 0220 04/22/23 0853 04/23/23 0337  NA 137 137 137 138 137  K 4.2 3.9 3.8 3.8 3.5   CL 100 97* 97* 97* 96*  CO2 28 33* 31 32 30  GLUCOSE 222* 153* 113* 130* 107*  BUN 19 18 21 16 20   CREATININE 0.71 0.77 0.90 0.74 0.89  CALCIUM 8.9 9.0 9.0 9.4 9.2  MG 1.9  --   --   --   --   PHOS 4.2  --   --   --   --    GFR: Estimated Creatinine Clearance: 127 mL/min (by C-G formula based on SCr of 0.89 mg/dL). Recent Labs  Lab 04/19/23 0253 04/20/23 0150 04/21/23 0220 04/22/23 0921  PROCALCITON <0.10  --  <0.10  --   WBC 8.9 11.6* 9.0 8.9    Liver Function Tests: No results for input(s): "AST", "ALT", "ALKPHOS", "BILITOT", "PROT", "ALBUMIN" in the last 168 hours. No results for input(s): "LIPASE", "AMYLASE" in the last 168 hours. No results for input(s): "AMMONIA" in the last 168 hours.  ABG    Component Value Date/Time   PHART 7.416 03/25/2022 0556   PCO2ART 38.9 03/25/2022 0556   PO2ART 83 03/25/2022 0556   HCO3 25.1 03/25/2022 0556   TCO2 35 (H) 07/14/2022 1045   ACIDBASEDEF 1.0 03/24/2022 2118   O2SAT 64.7 03/30/2022 0516     Coagulation Profile: No results for input(s): "INR", "PROTIME" in the last 168 hours.  Cardiac Enzymes: No results for input(s): "CKTOTAL", "CKMB", "CKMBINDEX", "TROPONINI" in the last 168 hours.  HbA1C: Hgb A1c MFr Bld  Date/Time Value Ref Range Status  04/19/2023 02:48 AM 6.9 (H) 4.8 - 5.6 % Final    Comment:    (NOTE) Pre diabetes:          5.7%-6.4%  Diabetes:              >6.4%  Glycemic control for   <7.0% adults with diabetes   03/23/2022 05:05 AM 5.9 (H) 4.8 - 5.6 % Final    Comment:    (NOTE) Pre diabetes:          5.7%-6.4%  Diabetes:              >6.4%  Glycemic control for   <7.0% adults with diabetes     CBG: Recent Labs  Lab 04/22/23 0744 04/22/23 1103 04/22/23 1559 04/22/23 2147 04/23/23 0801  GLUCAP 110* 139* 114* 128* 137*     Steve Jaeveon [place] ACNP Acute Care Nurse Practitioner [facility] Please consult Amion 04/23/2023, 8:28 AM 8:28 AM

## 2023-04-23 NOTE — Care Management Important Message (Signed)
Important Message  Patient Details  Name: Arthur Church MRN: [id number] Date of Birth: May 03, 1956   Medicare Important Message Given:  Yes     Sherilyn Banker 04/23/2023, 10:29 AM

## 2023-04-23 NOTE — Progress Notes (Signed)
  SATURATION QUALIFICATIONS: (This note is used to comply with regulatory documentation for home oxygen)   Patient Saturations on Room Air at Rest = 96%   Patient Saturations on Room Air while Ambulating = 86%   Patient Saturations on 4 Liters of oxygen while Ambulating = 97%   Please briefly explain why patient needs home oxygen:   Patient desat while ambulating and took up to 4 liters to recover from 86% to 92% in bout 1 minute. Pt exhibited s/s of SOB dep breathing and stopping to rest.

## 2023-04-23 NOTE — Progress Notes (Signed)
DAILY PROGRESS NOTE   Patient Name: Arthur Church Date of Encounter: 04/23/2023 Cardiologist: Chrystie Nose, MD  Chief Complaint   No complaints  Patient Profile   Arthur Church is a 67 y.o. male with a hx of tobacco abuse, ETOH use, HTN, atrial flutter with ablation 01/2023, systolic HF, CM, COPD on home 02 at night, pre-diabetes, CAD with  CABG X 2 V who is being seen 04/20/2023 for the evaluation of acute CHF at the request of Dr Benjamine Mola   Subjective   Net negative another 2.1L - now 14L negative. Creatinine relatively stable - potassium 3.5 today.   Objective   Vitals:   04/22/23 0747 04/22/23 0824 04/22/23 2258 04/23/23 0802  BP: (!) 141/82  136/77 (!) 143/84  Pulse: (!) 58 74 60 64  Resp: 18 16 16 18   Temp: 98.3 F (36.8 C)  98.4 F (36.9 C) 98 F (36.7 C)  TempSrc:   Oral Oral  SpO2: 91% 92% 93% 91%  Weight:      Height:        Intake/Output Summary (Last 24 hours) at 04/23/2023 1610 Last data filed at 04/22/2023 2100 Gross per 24 hour  Intake 840 ml  Output 3000 ml  Net -2160 ml   Filed Weights   04/20/23 0836 04/21/23 0711 04/22/23 0500  Weight: (!) 154.7 kg (!) 152.8 kg (!) 151.3 kg    Physical Exam   General appearance: alert Neck: no adenopathy, no carotid bruit, no JVD, supple, symmetrical, trachea midline, and thyroid not enlarged, symmetric, no tenderness/mass/nodules Lungs: clear to auscultation bilaterally Heart: regular rate and rhythm, S1, S2 normal, no murmur, click, rub or gallop Abdomen: soft, non-tender; bowel sounds normal; no masses,  no organomegaly Extremities: edema trace bilateral edema Pulses: 2+ and symmetric Skin: Skin color, texture, turgor normal. No rashes or lesions Neurologic: Grossly normal Psych: Pleasant  Inpatient Medications    Scheduled Meds:  amiodarone  100 mg Oral Daily   amoxicillin-clavulanate  1 tablet Oral Q12H   apixaban  5 mg Oral BID   aspirin EC  81 mg Oral Daily   atorvastatin  80 mg Oral Daily    carvedilol  3.125 mg Oral BID WC   fluticasone furoate-vilanterol  1 puff Inhalation Daily   And   umeclidinium bromide  1 puff Inhalation Daily   furosemide  60 mg Intravenous BID   insulin aspart  0-15 Units Subcutaneous TID WC   insulin aspart  0-5 Units Subcutaneous QHS   losartan  12.5 mg Oral Daily   montelukast  10 mg Oral QHS   pantoprazole  40 mg Oral Daily   spironolactone  12.5 mg Oral Daily    Continuous Infusions:   PRN Meds: acetaminophen, albuterol, ALPRAZolam, ipratropium-albuterol, melatonin, polyethylene glycol, prochlorperazine   Labs   Results for orders placed or performed during the hospital encounter of 04/18/23 (from the past 48 hour(s))  Glucose, capillary     Status: Abnormal   Collection Time: 04/21/23 11:27 AM  Result Value Ref Range   Glucose-Capillary 124 (H) 70 - 99 mg/dL    Comment: Glucose reference range applies only to samples taken after fasting for at least 8 hours.  Glucose, capillary     Status: None   Collection Time: 04/21/23  4:57 PM  Result Value Ref Range   Glucose-Capillary 86 70 - 99 mg/dL    Comment: Glucose reference range applies only to samples taken after fasting for at least 8 hours.  Glucose, capillary     Status: Abnormal   Collection Time: 04/21/23  8:01 PM  Result Value Ref Range   Glucose-Capillary 197 (H) 70 - 99 mg/dL    Comment: Glucose reference range applies only to samples taken after fasting for at least 8 hours.  Glucose, capillary     Status: Abnormal   Collection Time: 04/22/23  7:44 AM  Result Value Ref Range   Glucose-Capillary 110 (H) 70 - 99 mg/dL    Comment: Glucose reference range applies only to samples taken after fasting for at least 8 hours.  Basic metabolic panel     Status: Abnormal   Collection Time: 04/22/23  8:53 AM  Result Value Ref Range   Sodium 138 135 - 145 mmol/L   Potassium 3.8 3.5 - 5.1 mmol/L   Chloride 97 (L) 98 - 111 mmol/L   CO2 32 22 - 32 mmol/L   Glucose, Bld 130 (H) 70  - 99 mg/dL    Comment: Glucose reference range applies only to samples taken after fasting for at least 8 hours.   BUN 16 8 - 23 mg/dL   Creatinine, Ser 1.61 0.61 - 1.24 mg/dL   Calcium 9.4 8.9 - 09.6 mg/dL   GFR, Estimated >04 >54 mL/min    Comment: (NOTE) Calculated using the CKD-EPI Creatinine Equation (2021)    Anion gap 9 5 - 15    Comment: Performed at [facility], [address], [place], [place] [address]  CBC     Status: Abnormal   Collection Time: 04/22/23  9:21 AM  Result Value Ref Range   WBC 8.9 4.0 - 10.5 K/uL   RBC 5.51 4.22 - 5.81 MIL/uL   Hemoglobin 14.6 13.0 - 17.0 g/dL   HCT 91.4 78.2 - 95.6 %   MCV 83.5 80.0 - 100.0 fL   MCH 26.5 26.0 - 34.0 pg   MCHC 31.7 30.0 - 36.0 g/dL   RDW 21.3 (H) 08.6 - 57.8 %   Platelets 297 150 - 400 K/uL   nRBC 0.0 0.0 - 0.2 %    Comment: Performed at [facility], [address], [place], [place] [address]  Respiratory (~20 pathogens) panel by PCR     Status: None   Collection Time: 04/22/23  9:25 AM   Specimen: Nasopharyngeal Swab; Respiratory  Result Value Ref Range   Adenovirus NOT DETECTED NOT DETECTED   Coronavirus 229E NOT DETECTED NOT DETECTED    Comment: (NOTE) The Coronavirus on the Respiratory Panel, DOES NOT test for the novel  Coronavirus (2019 nCoV)    Coronavirus HKU1 NOT DETECTED NOT DETECTED   Coronavirus NL63 NOT DETECTED NOT DETECTED   Coronavirus OC43 NOT DETECTED NOT DETECTED   Metapneumovirus NOT DETECTED NOT DETECTED   Rhinovirus / Enterovirus NOT DETECTED NOT DETECTED   Influenza A NOT DETECTED NOT DETECTED   Influenza B NOT DETECTED NOT DETECTED   Parainfluenza Virus 1 NOT DETECTED NOT DETECTED   Parainfluenza Virus 2 NOT DETECTED NOT DETECTED   Parainfluenza Virus 3 NOT DETECTED NOT DETECTED   Parainfluenza Virus 4 NOT DETECTED NOT DETECTED   Respiratory Syncytial Virus NOT DETECTED NOT DETECTED   Bordetella pertussis NOT DETECTED NOT DETECTED   Bordetella Parapertussis NOT DETECTED  NOT DETECTED   Chlamydophila pneumoniae NOT DETECTED NOT DETECTED   Mycoplasma pneumoniae NOT DETECTED NOT DETECTED    Comment: Performed at [facility], [address], [place], [place] [address]  Glucose, capillary  Status: Abnormal   Collection Time: 04/22/23 11:03 AM  Result Value Ref Range   Glucose-Capillary 139 (H) 70 - 99 mg/dL    Comment: Glucose reference range applies only to samples taken after fasting for at least 8 hours.  Glucose, capillary     Status: Abnormal   Collection Time: 04/22/23  3:59 PM  Result Value Ref Range   Glucose-Capillary 114 (H) 70 - 99 mg/dL    Comment: Glucose reference range applies only to samples taken after fasting for at least 8 hours.  Glucose, capillary     Status: Abnormal   Collection Time: 04/22/23  9:47 PM  Result Value Ref Range   Glucose-Capillary 128 (H) 70 - 99 mg/dL    Comment: Glucose reference range applies only to samples taken after fasting for at least 8 hours.  Basic metabolic panel     Status: Abnormal   Collection Time: 04/23/23  3:37 AM  Result Value Ref Range   Sodium 137 135 - 145 mmol/L   Potassium 3.5 3.5 - 5.1 mmol/L   Chloride 96 (L) 98 - 111 mmol/L   CO2 30 22 - 32 mmol/L   Glucose, Bld 107 (H) 70 - 99 mg/dL    Comment: Glucose reference range applies only to samples taken after fasting for at least 8 hours.   BUN 20 8 - 23 mg/dL   Creatinine, Ser 1.61 0.61 - 1.24 mg/dL   Calcium 9.2 8.9 - 09.6 mg/dL   GFR, Estimated >04 >54 mL/min    Comment: (NOTE) Calculated using the CKD-EPI Creatinine Equation (2021)    Anion gap 11 5 - 15    Comment: Performed at [facility], [address] [place], [place] [address]  Glucose, capillary     Status: Abnormal   Collection Time: 04/23/23  8:01 AM  Result Value Ref Range   Glucose-Capillary 137 (H) 70 - 99 mg/dL    Comment: Glucose reference range applies only to samples taken after fasting for at least 8 hours.    ECG   N/A  Telemetry   Sinus  rhythm with PAC's - Personally Reviewed  Radiology    No results found.  Cardiac Studies   See echo above  Assessment   Principal Problem:   CAP (community acquired pneumonia) Active Problems:   Morbid obesity (HCC)   PAF (paroxysmal atrial fibrillation) (HCC)   Essential hypertension   Diabetes mellitus type 2, diet-controlled (HCC)   Acute on chronic HFrEF (heart failure with reduced ejection fraction) (HCC)   Hemoptysis   Plan   Continues to diurese well - got IV lasix today. Will switch to po lasix 80 mg daily starting tomorrow. Could likely be discharged today from a cardiac standpoint.  Time Spent Directly with Patient:  I have spent a total of 25 minutes with the patient reviewing hospital notes, telemetry, EKGs, labs and examining the patient as well as establishing an assessment and plan that was discussed personally with the patient.  > 50% of time was spent in direct patient care.  Length of Stay:  LOS: 4 days   Chrystie Nose, MD, [facility], FACP  [place]  [facility]  Medical Director of the Advanced Lipid Disorders &  Cardiovascular Risk Reduction Clinic Diplomate of the American Board of Clinical Lipidology Attending Cardiologist  Direct Dial: [phone]  Fax: [phone]  Website:  [email or web address] Nicely Numa Heatwole 04/23/2023, 8:23 AM

## 2023-04-23 NOTE — TOC Transition Note (Signed)
Transition of Care [facility]) - CM/SW Discharge Note   Patient Details  Name: Arthur Church MRN: [id number] Date of Birth: [birth year]  Transition of Care [facility]) CM/SW Contact:  Epifanio Lesches, RN Phone Number: 04/23/2023, 2:49 PM   Clinical Narrative:    Patient will DC to: home Anticipated DC date: 04/23/2023 Family notified: yes, daughter Transport by: car  Per MD patient ready for DC today. RN, patient, and patient's family notified of DC.   Order noted for DME oxygen. Pt currently active with [facility] with home oxygen.. Jason/[employer] made aware of new oxygen order.   Pt states doesn't need portable tank for transportation to home. [employer] to f/u with pt @ home. Pt without RX med concerns. TOC pharmacy to delivery RX meds to bedside prior to d/c.Marland Kitchen  Post hospital f/u noted on AVS. Daughter to provide transportation to home.  RNCM will sign off for now as intervention is no longer needed. Please consult [place] again if new needs arise.   Final next level of care: Home/Self Care Barriers to Discharge: No Barriers Identified   Patient Goals and CMS Choice      Discharge Placement                         Discharge Plan and Services Additional resources added to the After Visit Summary for     Discharge Planning Services: CM Consult            DME Arranged: Oxygen   Date DME Agency Contacted: 04/23/23 Time DME Agency Contacted: [phone] Representative spoke with at DME Agency: Barbara Cower            Social Determinants of Health (SDOH) Interventions SDOH Screenings   Food Insecurity: No Food Insecurity (04/20/2023)  Housing: Low Risk  (04/20/2023)  Transportation Needs: Unknown (04/20/2023)  Alcohol Screen: Low Risk  (04/20/2023)  Depression (PHQ2-9): Low Risk  (07/18/2019)  Financial Resource Strain: Low Risk  (04/20/2023)  Social Connections: Unknown (07/18/2019)  Tobacco Use: Medium Risk (04/20/2023)     Readmission Risk Interventions    04/02/2022   10:48 AM  04/01/2022   11:39 AM  Readmission Risk Prevention Plan  Transportation Screening  Complete  PCP or Specialist Appt within 5-7 Days  Complete  PCP or Specialist Appt within 3-5 Days Complete   Home Care Screening  Complete  Medication Review (RN CM)  Complete  HRI or Home Care Consult Complete   Social Work Consult for Recovery Care Planning/Counseling Complete   Palliative Care Screening Not Applicable   Medication Review Oceanographer) Complete

## 2023-04-23 NOTE — Progress Notes (Addendum)
   Heart Failure Stewardship Pharmacist Progress Note   PCP: Virgilio Belling, PA-C PCP-Cardiologist: Chrystie Nose, MD    HPI:  67 yo M with a PMH of HTN, CHF, CAD, afib, and prediabetes.   Presented to the ED on 5/5 with shortness of breath, 20 lb weight gain, and LE edema despite taking additional lasix at home.  CXR with new right lateral basilar airspace opacity. CTA negative for PE, also with mild cardiomegaly and multi lobar PNA. ECHO 5/6 showed LVEF 50% (was 45-50% during CABG in 2023), global hypokinesis, G2DD, and RV mildly reduced.   Current HF Medications: Diuretic: furosemide 80 mg PO daily Beta Blocker: carvedilol 3.125 mg BID ACE/ARB/ARNI: losartan 12.5 mg daily MRA: spironolactone 12.5 mg daily  Prior to admission HF Medications: Diuretic: furosemide 40 mg daily Beta blocker: carvedilol 12.5 mg BID  Pertinent Lab Values: Serum creatinine 0.89, BUN 20, Potassium 3.5, Sodium 137, BNP 355.4, Magnesium 1.9, A1c 6.9  Vital Signs: Weight: 333 lbs (admission weight: 345 lbs) Blood pressure: 140/70s  Heart rate: 60s  I/O: -6.5L yesterday; net -14.7L  Medication Assistance / Insurance Benefits Check: Does the patient have prescription insurance?  Yes Type of insurance plan: [employer] Medicare  Outpatient Pharmacy:  Prior to admission outpatient pharmacy: [facility] Is the patient willing to use [facility] TOC pharmacy at discharge? Yes Is the patient willing to transition their outpatient pharmacy to utilize a [facility] outpatient pharmacy?   No    Assessment: 1. Acute on chronic systolic and diastolic CHF (LVEF 50%), due to NICM. NYHA class III symptoms. - Agree with transitioning to furosemide 80 mg PO daily tomorrow. Strict I/Os and daily weights. Keep K>4 and Mg>2. - Continue carvedilol 3.125 mg BID - Continue losartan 12.5 mg daily, consider increasing to 25 mg daily - Can rechallenge Entresto as outpatient pending BP and K. Had been taken off Entresto back during  hospitalization in 2016 due to hyperkalemia. K 4.0>5.2 after initiation. Note lab value at that time had slight hemolysis. Recheck that day was improved to 4.6.  - Continue spironolactone 12.5 mg daily - Noted prior intolerance to Jardiance   Plan: 1) Medication changes recommended at this time: - Increase losartan to 25 mg daily  2) Patient assistance: Sherryll Burger copay $11.20 - Farxiga/Jardiance copay $11.20 - Patient is traveling to [facility] in 2 weeks. Encouraged him to ask for refills to [facility] to [facility] at his follow up appt before he leaves. Added the [employer] in [place], [place] to his chart.   3)  Education  - Patient has been educated on current HF medications and potential additions to HF medication regimen - Patient verbalizes understanding that over the next few months, these medication doses may change and more medications may be added to optimize HF regimen - Patient has been educated on basic disease state pathophysiology and goals of therapy   Sharen Hones, PharmD, BCPS Heart Failure Stewardship Pharmacist Phone [phone]

## 2023-05-20 ENCOUNTER — Ambulatory Visit: Payer: Medicare (Managed Care) | Admitting: Cardiology

## 2023-05-31 ENCOUNTER — Telehealth (HOSPITAL_COMMUNITY): Payer: Self-pay

## 2023-05-31 ENCOUNTER — Ambulatory Visit (HOSPITAL_COMMUNITY)
Admit: 2023-05-31 | Discharge: 2023-05-31 | Disposition: A | Discharge status: Home / Self Care | Payer: Medicare Other | Attending: Physician Assistant | Admitting: Physician Assistant

## 2023-05-31 ENCOUNTER — Encounter (HOSPITAL_COMMUNITY): Payer: Self-pay

## 2023-05-31 ENCOUNTER — Other Ambulatory Visit (HOSPITAL_COMMUNITY): Payer: Self-pay

## 2023-05-31 VITALS — BP 130/80 | HR 48 | Wt 336.0 lb

## 2023-05-31 DIAGNOSIS — I480 Paroxysmal atrial fibrillation: Secondary | ICD-10-CM | POA: Diagnosis not present

## 2023-05-31 DIAGNOSIS — Z7982 Long term (current) use of aspirin: Secondary | ICD-10-CM | POA: Diagnosis not present

## 2023-05-31 DIAGNOSIS — I5022 Chronic systolic (congestive) heart failure: Secondary | ICD-10-CM | POA: Diagnosis not present

## 2023-05-31 DIAGNOSIS — I4819 Other persistent atrial fibrillation: Secondary | ICD-10-CM | POA: Insufficient documentation

## 2023-05-31 DIAGNOSIS — I440 Atrioventricular block, first degree: Secondary | ICD-10-CM | POA: Diagnosis not present

## 2023-05-31 DIAGNOSIS — I110 Hypertensive heart disease with heart failure: Secondary | ICD-10-CM | POA: Diagnosis not present

## 2023-05-31 DIAGNOSIS — Z8249 Family history of ischemic heart disease and other diseases of the circulatory system: Secondary | ICD-10-CM | POA: Diagnosis not present

## 2023-05-31 DIAGNOSIS — Z7984 Long term (current) use of oral hypoglycemic drugs: Secondary | ICD-10-CM | POA: Insufficient documentation

## 2023-05-31 DIAGNOSIS — Z951 Presence of aortocoronary bypass graft: Secondary | ICD-10-CM | POA: Diagnosis not present

## 2023-05-31 DIAGNOSIS — J449 Chronic obstructive pulmonary disease, unspecified: Secondary | ICD-10-CM | POA: Diagnosis not present

## 2023-05-31 DIAGNOSIS — Z79899 Other long term (current) drug therapy: Secondary | ICD-10-CM | POA: Insufficient documentation

## 2023-05-31 DIAGNOSIS — I2510 Atherosclerotic heart disease of native coronary artery without angina pectoris: Secondary | ICD-10-CM | POA: Insufficient documentation

## 2023-05-31 DIAGNOSIS — R0609 Other forms of dyspnea: Secondary | ICD-10-CM | POA: Insufficient documentation

## 2023-05-31 DIAGNOSIS — E119 Type 2 diabetes mellitus without complications: Secondary | ICD-10-CM | POA: Diagnosis not present

## 2023-05-31 DIAGNOSIS — I5020 Unspecified systolic (congestive) heart failure: Secondary | ICD-10-CM | POA: Diagnosis not present

## 2023-05-31 LAB — COMPREHENSIVE METABOLIC PANEL
ALT: 14 U/L (ref 0–44)
AST: 18 U/L (ref 15–41)
Albumin: 3.4 g/dL — ABNORMAL LOW (ref 3.5–5.0)
Alkaline Phosphatase: 54 U/L (ref 38–126)
Anion gap: 9 (ref 5–15)
BUN: 22 mg/dL (ref 8–23)
CO2: 31 mmol/L (ref 22–32)
Calcium: 8.9 mg/dL (ref 8.9–10.3)
Chloride: 101 mmol/L (ref 98–111)
Creatinine, Ser: 0.89 mg/dL (ref 0.61–1.24)
GFR, Estimated: >60 mL/min (ref >60)
Glucose, Bld: 135 mg/dL — ABNORMAL HIGH (ref 70–99)
Potassium: 4.5 mmol/L (ref 3.5–5.1)
Sodium: 141 mmol/L (ref 135–145)
Total Bilirubin: 1 mg/dL (ref 0.3–1.2)
Total Protein: 6.3 g/dL — ABNORMAL LOW (ref 6.5–8.1)

## 2023-05-31 LAB — BRAIN NATRIURETIC PEPTIDE: B Natriuretic Peptide: 343.8 pg/mL — ABNORMAL HIGH (ref 0.0–100.0)

## 2023-05-31 MED ORDER — CARVEDILOL 6.25 MG PO TABS: 6.2500 mg | ORAL_TABLET | Freq: Two times a day (BID) | ORAL | 4 refills | Status: DC

## 2023-05-31 MED ORDER — ENTRESTO 24-26 MG PO TABS: 1.0000 | ORAL_TABLET | Freq: Two times a day (BID) | ORAL | 3 refills | Status: DC

## 2023-05-31 NOTE — Telephone Encounter (Addendum)
Pt aware, agreeable, and verbalized understanding    ----- Message from Andrey Farmer, PA-C sent at 05/31/2023  4:34 PM EDT ----- K 4.5. He should stop potassium supplement (he has been taking unknown dose at home, not on med list).

## 2023-05-31 NOTE — Progress Notes (Addendum)
HEART & VASCULAR TRANSITION OF CARE CONSULT NOTE     Referring Physician: Dr. Caleb Popp Primary Care: Sherrie Mustache, PA-C Primary Cardiologist: Dr. Rennis Golden EP: Dr. Elberta Fortis  HPI: Referred to clinic by Dr. Caleb Popp with [facility] for heart failure consultation. 67 y.o. male with history of HFrEF with recovered EF, CAD s/p CABG 04/23 (LIMA to LAD, VG to PLA), atrial fibrillation/flutter s/p ablation in 02/24, obesity, prior OSA now off CPAP (normal sleep study 06/23), hypoxemia on nocturnal O2, DM II, COPD.  EF as low as 20-25% in 2016. This was in setting of atrial fibrillation. Improved to 55-60% in 2023.   Had been on Tikosyn for afib. Following CABG in 2023 placed on amiodarone. Required cardioversion several times and ultimately underwent ablation in 02/24. Echo 04/23: EF 45-50%  He presented 05/24 with dyspnea and 20 lb weight gain. He had evidence of multilobar pneumonia on CT. Treated with abx for PNA. Diuresed with IV lasix. Rhythm was sinus brady. GDMT titrated. Echo during admit with EF 50%, RV mildly reduced, mild MR  He is here today for hospital follow-up. He went on vacation for 3 weeks and reports that he ate poorly. He retained fluid and recently took an extra 40 mg furosemide three days in a row. Patient does not feel he is volume overloaded any longer. His discharge weight was 326 lb, he was 330 lb at home today. He feels the remaining weight gain was d/t caloric intake. Has chronic dyspnea with exertion which he feels is at least in part d/t COPD. Uses supplemental O2 as needed during day. Walks a couple of miles most days a week. No orthopnea or PND. Intermittent LE edema. Takes medications as prescribed. He has been bradycardic with HR in 40s. He is worried this is causing fatigue.   Quit smoking 13 years ago.    Past Medical History:  Diagnosis Date   Chronic systolic CHF (congestive heart failure) (HCC)    a. 2D ECHO (03/06/15) EF 20-25%, diffuse HK. Asc aortic diameter:  40mm. Mild LA dilation, mild RV dilation. Mild RV systolic dysfunction   b. LifeVest placed on 02/2015 admissoin    Coronary artery disease    a. 70% LAD lesion   Elevated TSH    ETOH abuse    Hypertension    Morbid obesity (HCC)    a. BMI ~46   Persistent atrial fibrillation (HCC)    a. newly dx 02/2015 admission. s/p successful TEE/DCCV  b. on Eliquis   Pre-diabetes    a. HgA1c 6.1    Current Outpatient Medications  Medication Sig Dispense Refill   acetaminophen (TYLENOL) 500 MG tablet Take 500 mg by mouth every 6 (six) hours as needed for mild pain, moderate pain or headache.     ALPRAZolam (XANAX) 0.5 MG tablet TAKE 1 TABLET BY MOUTH THREE TIMES DAILYAS NEEDED FOR ANXIETY 20 tablet 0   AMBULATORY NON FORMULARY MEDICATION Portable oxygen concentrator, 3 L [place].  Please fax to aero care 1 each 0   amiodarone (PACERONE) 200 MG tablet TAKE 1 TABLET(200 MG) BY MOUTH EVERY MORNING 90 tablet 1   apixaban (ELIQUIS) 5 MG TABS tablet Take 1 tablet (5 mg total) by mouth 2 (two) times daily. 180 tablet 3   Artificial Tear Ointment (DRY EYES OP) Place 1 drop into both eyes 2 (two) times daily as needed (dry eyes).     aspirin EC 81 MG EC tablet Take 1 tablet (81 mg total) by mouth daily. Swallow whole. 30  tablet 11   atorvastatin (LIPITOR) 80 MG tablet Take 1 tablet (80 mg total) by mouth daily. 90 tablet 3   furosemide (LASIX) 80 MG tablet Take 1 tablet (80 mg total) by mouth daily. 30 tablet 2   hydrocortisone 2.5 % cream Apply 1 Application topically daily as needed (rash, itching, irritation).     loratadine (CLARITIN) 10 MG tablet Take 10 mg by mouth daily.     Magnesium 400 MG TABS Take 400 mg by mouth 2 (two) times a day. 60 tablet 11   montelukast (SINGULAIR) 10 MG tablet Take 10 mg by mouth at bedtime.     OXYGEN Inhale 2-3 L into the lungs at bedtime.     pantoprazole (PROTONIX) 40 MG tablet Take 1 tablet (40 mg total) by mouth daily. 90 tablet 1   sacubitril-valsartan (ENTRESTO) 24-26  MG Take 1 tablet by mouth 2 (two) times daily. 180 tablet 3   sildenafil (REVATIO) 20 MG tablet Take 2-3 tablets (40-60 mg total) by mouth as needed. 30 tablet 1   spironolactone (ALDACTONE) 25 MG tablet Take 1/2 tablet (12.5 mg total) by mouth daily. 15 tablet 2   TRELEGY ELLIPTA 100-62.5-25 MCG/ACT AEPB Inhale 1 puff into the lungs daily.     carvedilol (COREG) 6.25 MG tablet Take 1 tablet (6.25 mg total) by mouth 2 (two) times daily with a meal. 90 tablet 4   No current facility-administered medications for this encounter.    No Known Allergies    Social History   Socioeconomic History   Marital status: Widowed    Spouse name: Not on file   Number of children: 1   Years of education: Not on file   Highest education level: Not on file  Occupational History   Occupation: security guard  Tobacco Use   Smoking status: Former    Types: Cigarettes    Quit date: 01/02/2013    Years since quitting: 10.4   Smokeless tobacco: Never   Tobacco comments:    smokes about a week  Vaping Use   Vaping Use: Never used  Substance and Sexual Activity   Alcohol use: Not Currently   Drug use: No   Sexual activity: Not Currently  Other Topics Concern   Not on file  Social History Narrative   Mr Javadi 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 He reports he is independent with all his care needs and transportation to medical appointments   He is stating he is to either retire or go out on disability soon.    His daughter, Hassel Neth is a SW per pt   Social Determinants of Health   Financial Resource Strain: Low Risk  (04/20/2023)   Overall Financial Resource Strain (CARDIA)    Difficulty of Paying Living Expenses: Not hard at all  Food Insecurity: No Food Insecurity (04/20/2023)   Hunger Vital Sign    Worried About Running Out of Food in the Last Year: Never true    Ran Out of Food in the Last Year: Never true  Transportation Needs: Unknown (04/20/2023)   PRAPARE  - Administrator, Civil Service (Medical): No    Lack of Transportation (Non-Medical): Not on file  Physical Activity: Not on file  Stress: Not on file  Social Connections: Unknown (07/18/2019)   Social Connection and Isolation Panel [NHANES]    Frequency of Communication with Friends and Family: Not on file    Frequency of  Social Gatherings with Friends and Family: Not on file    Attends Religious Services: Not on file    Active Member of Clubs or Organizations: Not on file    Attends Banker Meetings: Not on file    Marital Status: Widowed  Catering manager Violence: Not on file      Family History  Problem Relation Age of Onset   Uterine cancer Sister    Heart attack Mother    Breast cancer Mother    Sleep apnea Brother    Heart attack Maternal Grandfather    Sleep apnea Brother     Vitals:   05/31/23 1102  BP: 130/80  Pulse: (!) 48  Weight: (!) 152.4 kg (336 lb)    PHYSICAL EXAM: General:  Well appearing. No respiratory difficulty HEENT: normal Neck: supple. no JVD. Carotids 2+ bilat; no bruits. No lymphadenopathy or thryomegaly appreciated. Cor: PMI nondisplaced. Regular rate & rhythm. No rubs, gallops or murmurs. Lungs: clear Abdomen: soft, nontender, nondistended. No hepatosplenomegaly. No bruits or masses. Good bowel sounds. Extremities: no cyanosis, clubbing, rash, edema Neuro: alert & oriented x 3, cranial nerves grossly intact. moves all 4 extremities w/o difficulty. Affect pleasant.  ECG: Sinus brady with 1st degree AV block, 48 bpm   ASSESSMENT & PLAN: HFrEF with recovered EF -EF previously as low as 20-25% in 2016 in setting of AF -EF improved to 55-60% in 04/23 -Intra-op TEE post CABG 04/23: EF 45-50% -Echo 05/24: EF 50%, grade II DD, RV mildly reduced, mild MR -Recent admit with a/c CHF in setting of PNA NYHA II GDMT  Diuretic-Volume okay on exam today. Continue 80 mg lasix daily. Takes an extra 40 mg PRN. Currently taking  unknown dose of potassium supplement, not on med list. Lab today. BB-Decrease Coreg to 6.25 mg BID d/t bradycardia Ace/ARB/ARNI-Switch Losartan to Entresto 24/26 mg BID. Entresto stopped in the in 2016 d/t hyperkalemia. Prior labs reviewed, lab had hemolyzed. No hyperkalemia over the last couple of years.  MRA-Continue spiro 12.5 mg daily SGLT2i-Intolerant to Jardiance in the past. Consider Farxiga next. -Labs today, repeat BMET at f/u in 2 weeks  CAD -s/p CABG in 04/23 -On aspirin. Consider discontinuing since no recent PCI and > 1 year since ACS -Continue Atorvastatin -Followed by Cardiology  PAF/AFL -S/p atrial fibrillation and atrial flutter ablation in 02/24 -Has been maintained on amiodarone. Followed by EP. -Sinus brady today. Decreased coreg today  DM II -A1c 6.9% -Consider Marcelline Deist next visit  Referred to HFSW (PCP, Medications, Transportation, ETOH Abuse, Drug Abuse, Insurance, Surveyor, quantity ): No Refer to Pharmacy: No Refer to Home Health: No Refer to Advanced Heart Failure Clinic: no Refer to General Cardiology: No, already established  Follow up  TOC in 2 weeks to titrate GDMT, f/u with EP next week. Keep follow-up with Dr. Rennis Golden in 09/24.

## 2023-05-31 NOTE — Patient Instructions (Signed)
STOP Losartan  START  Entresto 24/26 mg Twice daily  DECREASE Carvedilol to 6.25 mg Twice daily  PLEASE CHECK DOSE OF POTASSIUM TABLET AND LET us KNOW AT NEXT VISIT.  Labs done today, your results will be available in MyChart, we will contact you for abnormal readings.  Thank you for allowing [place] to provider your heart failure care after your recent hospitalization. Please follow-up in 2 weeks.  If you have any questions, issues, or concerns before your next appointment please call our office at [phone], opt. 2 and leave a message for the triage nurse.

## 2023-06-04 ENCOUNTER — Ambulatory Visit: Payer: Medicare Other | Attending: Cardiology | Admitting: Cardiology

## 2023-06-04 ENCOUNTER — Encounter: Payer: Self-pay | Admitting: Cardiology

## 2023-06-04 VITALS — BP 140/78 | HR 54 | Ht 73.0 in | Wt 342.0 lb

## 2023-06-04 DIAGNOSIS — I2510 Atherosclerotic heart disease of native coronary artery without angina pectoris: Secondary | ICD-10-CM | POA: Diagnosis not present

## 2023-06-04 DIAGNOSIS — D6869 Other thrombophilia: Secondary | ICD-10-CM

## 2023-06-04 DIAGNOSIS — I4819 Other persistent atrial fibrillation: Secondary | ICD-10-CM | POA: Diagnosis not present

## 2023-06-04 DIAGNOSIS — I5022 Chronic systolic (congestive) heart failure: Secondary | ICD-10-CM | POA: Diagnosis not present

## 2023-06-04 NOTE — Patient Instructions (Signed)
Medication Instructions:  Your physician has recommended you make the following change in your medication:  STOP Amiodarone  *If you need a refill on your cardiac medications before your next appointment, please call your pharmacy*   Lab Work: None ordered  Testing/Procedures: None ordered   Follow-Up: At [facility], you and your health needs are our priority.  As part of our continuing mission to provide you with exceptional heart care, we have created designated Provider Care Teams.  These Care Teams include your primary Cardiologist (physician) and Advanced Practice Providers (APPs -  Physician Assistants and Nurse Practitioners) who all work together to provide you with the care you need, when you need it.  Your next appointment:   6 month(s)  The format for your next appointment:   In Person  Provider:   You will see one of the following Advanced Practice Providers on your designated Care Team:   Francis Dowse, [place] "Mardelle Matte" [place], [place] Roslynn Amble, NP  Thank you for choosing [facility]!!   Dory Horn, RN [phone]

## 2023-06-04 NOTE — Progress Notes (Signed)
  Electrophysiology Office Note:   Date:  06/04/2023  ID:  Arthur Church, DOB February 09, 1956, MRN [id number]  Primary Cardiologist: Chrystie Nose, MD Electrophysiologist: Regan Lemming, MD      History of Present Illness:   Arthur Church is a 67 y.o. male with h/o coronary artery disease, atrial fibrillation, morbid obesity seen today for routine electrophysiology followup.  Since last being seen in our clinic the patient reports doing.  He was hospitalized May 2024 with community-acquired pneumonia.  he denies chest pain, palpitations, dyspnea, PND, orthopnea, nausea, vomiting, dizziness, syncope, edema, weight gain, or early satiety.     History significant for coronary artery disease post CABG, alcohol abuse, hypertension, morbid obesity, prediabetes, atrial fibrillation.  He is post atrial fibrillation ablation 01/28/2023 with PVI, posterior wall isolation, ablation from the right superior pulmonary vein to the mitral valve, and CTI.     Review of systems complete and found to be negative unless listed in HPI.   Studies Reviewed:    EKG is not ordered today. EKG from 05/31/23 reviewed which showed sinus rhythm      Risk Assessment/Calculations:    CHA2DS2-VASc Score = 4   This indicates a 4.8% annual risk of stroke. The patient's score is based upon: CHF History: 1 HTN History: 1 Diabetes History: 0 (pre diabetes) Stroke History: 0 Vascular Disease History: 1 Age Score: 1 Gender Score: 0      Physical Exam:   VS:  BP (!) 140/78   Pulse (!) 54   Ht 6\' 1"  (1.854 m)   Wt (!) 342 lb (155.1 kg)   SpO2 93%   BMI 45.12 kg/m    Wt Readings from Last 3 Encounters:  06/04/23 (!) 342 lb (155.1 kg)  05/31/23 (!) 336 lb (152.4 kg)  04/23/23 (!) 326 lb (147.9 kg)     GEN: Well nourished, well developed in no acute distress NECK: No JVD; No carotid bruits CARDIAC: Regular rate and rhythm, no murmurs, rubs, gallops RESPIRATORY:  Clear to auscultation without rales,  wheezing or rhonchi  ABDOMEN: Soft, non-tender, non-distended EXTREMITIES:  No edema; No deformity   ASSESSMENT AND PLAN:    1.  Persistent atrial fibrillation/flutter: Post ablation 11/28/2023.  Currently on amiodarone and Eliquis.  Feeling well in normal rhythm.  Arthur Church stop amiodarone today.  2.  Secondary hypercoagulable state: Currently on Eliquis for atrial fibrillation  3.  Coronary artery disease: Status post CABG.  No current angina.  4.  Chronic systolic and diastolic heart failure: Ejection fraction is normalized.  Plan per primary cardiology.  5.  Hypertension: Mildly elevated but usually well-controlled  Follow up with Afib Clinic in 6 months  Signed, Adaora Mchaney Jorja Loa, MD

## 2023-06-14 NOTE — Progress Notes (Signed)
HEART & VASCULAR TRANSITION OF CARE NOTE     Referring Physician: Dr. Caleb Popp Primary Care: Sherrie Mustache, PA-C Primary Cardiologist: Dr. Rennis Golden EP: Dr. Elberta Fortis  HPI: 67 y.o. male with history of HFrEF with recovered EF, CAD s/p CABG 04/23 (LIMA to LAD, VG to PLA), atrial fibrillation/flutter s/p ablation in 2/24, obesity, prior OSA now off CPAP (normal sleep study 06/23), hypoxemia on nocturnal O2, DM II, COPD.  EF as low as 20-25% in 2016. This was in setting of atrial fibrillation. Improved to 55-60% in 2023.   Had been on Tikosyn for afib. Following CABG in 2023 placed on amiodarone. Required cardioversion several times and ultimately underwent ablation in 2/24.   Admitted 5/24 with dyspnea and 20 lb weight gain, also with evidence of multilobar PNA. Treated with abx for PNA and diuresed with IV lasix. Rhythm was sinus brady. GDMT titrated. Echo during admit with EF 50%, RV mildly reduced, mild MR.  [facility]ially seen in [facility] 05/31/23, NYHA II and volume stable. Coreg decreased to 6.25 bid with bradycardia. Follow up with EP 6/24, amiodarone stopped.  Today he returns to [facility] for HF follow up. Overall feeling fine. Ankles are swelling some. Returned from 3 week vacation and had dietary indiscretion. He walks 2 miles/day without dyspnea. He has SOB walking up steps. Denies palpitations, abnormal bleeding, CP, dizziness, or PND/Orthopnea. Appetite ok. No fever or chills. Weight at home 338 pounds. Taking all medications. Quit smoking 13 years ago. Wears 3L oxygen at night, no OSA. 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  Cardiac Studies  - Echo (5/24): EF 50%, RV mildly reduced, mild MR  - s/p ablation (2/24)  - Echo (4/23): EF 55-60%, mild LVH, grade 1 DD  - Echo (2016): EF 20-25%, in setting of AF  Past Medical History:  Diagnosis Date   Chronic systolic CHF (congestive heart failure) (HCC)    a. 2D ECHO (03/06/15) EF 20-25%, diffuse HK. Asc aortic diameter: 40mm. Mild LA  dilation, mild RV dilation. Mild RV systolic dysfunction   b. LifeVest placed on 02/2015 admissoin    Coronary artery disease    a. 70% LAD lesion   Elevated TSH    ETOH abuse    Hypertension    Morbid obesity (HCC)    a. BMI ~46   Persistent atrial fibrillation (HCC)    a. newly dx 02/2015 admission. s/p successful TEE/DCCV  b. on Eliquis   Pre-diabetes    a. HgA1c 6.1    Current Outpatient Medications  Medication Sig Dispense Refill   acetaminophen (TYLENOL) 500 MG tablet Take 500 mg by mouth every 6 (six) hours as needed for mild pain, moderate pain or headache.     ALPRAZolam (XANAX) 0.5 MG tablet TAKE 1 TABLET BY MOUTH THREE TIMES DAILYAS NEEDED FOR ANXIETY 20 tablet 0   AMBULATORY NON FORMULARY MEDICATION Portable oxygen concentrator, 3 L [place].  Please fax to [facility]ero care 1 each 0   apixaban (ELIQUIS) 5 MG TABS tablet Take 1 tablet (5 mg total) by mouth 2 (two) times daily. 180 tablet 3   Artificial Tear Ointment (DRY EYES OP) Place 1 drop into both eyes 2 (two) times daily as needed (dry eyes).     aspirin EC 81 MG EC tablet Take 1 tablet (81 mg total) by mouth daily. Swallow whole. 30 tablet 11   atorvastatin (LIPITOR) 80 MG tablet Take 1 tablet (80 mg total) by mouth daily. 90 tablet 3   carvedilol (COREG) 6.25 MG tablet  Take 1 tablet (6.25 mg total) by mouth 2 (two) times daily with a meal. 90 tablet 4   furosemide (LASIX) 80 MG tablet Take 1 tablet (80 mg total) by mouth daily. 30 tablet 2   hydrocortisone 2.5 % cream Apply 1 Application topically daily as needed (rash, itching, irritation).     loratadine (CLARITIN) 10 MG tablet Take 10 mg by mouth daily.     Magnesium 400 MG TABS Take 400 mg by mouth 2 (two) times a day. 60 tablet 11   montelukast (SINGULAIR) 10 MG tablet Take 10 mg by mouth at bedtime.     OXYGEN Inhale 2-3 L into the lungs at bedtime.     pantoprazole (PROTONIX) 40 MG tablet Take 1 tablet (40 mg total) by mouth daily. 90 tablet 1   sacubitril-valsartan  (ENTRESTO) 24-26 MG Take 1 tablet by mouth 2 (two) times daily. 180 tablet 3   sildenafil (REVATIO) 20 MG tablet Take 2-3 tablets (40-60 mg total) by mouth as needed. 30 tablet 1   spironolactone (ALDACTONE) 25 MG tablet Take 1/2 tablet (12.5 mg total) by mouth daily. 15 tablet 2   TRELEGY ELLIPTA 100-62.5-25 MCG/ACT AEPB Inhale 1 puff into the lungs daily.     No current facility-administered medications for this encounter.    No Known Allergies    Social History   Socioeconomic History   Marital status: Widowed    Spouse name: Not on file   Number of children: 1   Years of education: Not on file   Highest education level: Not on file  Occupational History   Occupation: security guard  Tobacco Use   Smoking status: Former    Types: Cigarettes    Quit date: 01/02/2013    Years since quitting: 10.4   Smokeless tobacco: Never   Tobacco comments:    smokes about a week  Vaping Use   Vaping Use: Never used  Substance and Sexual Activity   Alcohol use: Not Currently   Drug use: No   Sexual activity: Not Currently  Other Topics Concern   Not on file  Social History Narrative   Mr Schram 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 He reports he is independent with all his care needs and transportation to medical appointments   He is stating he is to either retire or go out on disability soon.    His daughter, Hassel Neth is a SW per pt   Social Determinants of Health   Financial Resource Strain: Low Risk  (04/20/2023)   Overall Financial Resource Strain (CARDIA)    Difficulty of Paying Living Expenses: Not hard at all  Food Insecurity: No Food Insecurity (04/20/2023)   Hunger Vital Sign    Worried About Running Out of Food in the Last Year: Never true    Ran Out of Food in the Last Year: Never true  Transportation Needs: Unknown (04/20/2023)   PRAPARE - Administrator, Civil Service (Medical): No    Lack of Transportation (Non-Medical): Not  on file  Physical Activity: Not on file  Stress: Not on file  Social Connections: Unknown (07/18/2019)   Social Connection and Isolation Panel [NHANES]    Frequency of Communication with Friends and Family: Not on file    Frequency of Social Gatherings with Friends and Family: Not on file    Attends Religious Services: Not on file    Active Member of Clubs or Organizations: Not  on file    Attends Club or Organization Meetings: Not on file    Marital Status: Widowed  Catering manager Violence: Not on file      Family History  Problem Relation Age of Onset   Uterine cancer Sister    Heart attack Mother    Breast cancer Mother    Sleep apnea Brother    Heart attack Maternal Grandfather    Sleep apnea Brother    BP 126/74   Pulse 60   Wt (!) 156.9 kg (345 lb 12.8 oz)   SpO2 90%   BMI 45.62 kg/m   Wt Readings from Last 3 Encounters:  06/15/23 (!) 156.9 kg (345 lb 12.8 oz)  06/04/23 (!) 155.1 kg (342 lb)  05/31/23 (!) 152.4 kg (336 lb)   PHYSICAL EXAM: General:  NAD. No resp difficulty, walked into clinic HEENT: Normal Neck: Supple. JVP 10. Carotids 2+ bilat; no bruits. No lymphadenopathy or thryomegaly appreciated. Cor: PMI nondisplaced. Regular rate & rhythm. No rubs, gallops or murmurs. Lungs: Clear Abdomen: Soft, obese, nontender, nondistended. No hepatosplenomegaly. No bruits or masses. Good bowel sounds. Extremities: No cyanosis, clubbing, rash, trace BLE edema Neuro: Alert & oriented x 3, cranial nerves grossly intact. Moves all 4 extremities w/o difficulty. Affect pleasant.   ASSESSMENT & PLAN: HFrEF with recovered EF - EF previously as low as 20-25% in 2016 in setting of AF - Echo (4/23): EF improved to 55-60%  - Intra-op TEE post CABG (4/23): EF 45-50% - Echo (5/24): EF 50%, grade II DD, RV mildly reduced, mild MR - Recent admit with a/c CHF in setting of PNA - NYHA II, volume up a bit today, weight up 9 lbs - SGLT2i- Intolerant to Jardiance in the past  (nausea). Start Farxiga 10 mg daily. Discussed potential side effects. - Diuretic- Continue Lasix 80 mg daily. Takes an extra 40 mg PRN.  - BB- Continue Coreg 6.25 mg bid - Ace/ARB/ARNI- Continue Entresto 24/26 mg bid.  - MRA- Continue spironolactone 12.5 mg daily - BMET and BNP today.  2. CAD - s/p CABG 4/23 - No chest pain. - On aspirin. Consider discontinuing since no recent PCI and > 1 year since ACS. - Continue atorvastatin - Followed by Cardiology  3. PAF/AFL - S/p atrial fibrillation and atrial flutter ablation in 2/24 - Follow by EP. Amiodarone stopped 05/2023. - Regular on exam today. - Continue Eliquis 5 mg bid. No bleeding issues.  4. DM II - A1c 6.9% - Starting Farxiga as above  Referred to HFSW (PCP, Medications, Transportation, ETOH Abuse, Drug Abuse, Insurance, Financial ): No Refer to Pharmacy: No Refer to Home Health: No Refer to Advanced Heart Failure Clinic: No Refer to General Cardiology: No, already established  Keep follow-up with Dr. Rennis Golden in 08/25/23.  Prince Rome, FNP-BC 06/15/23

## 2023-06-15 ENCOUNTER — Encounter (HOSPITAL_COMMUNITY): Payer: Self-pay

## 2023-06-15 ENCOUNTER — Ambulatory Visit (HOSPITAL_COMMUNITY)
Admission: RE | Admit: 2023-06-15 | Discharge: 2023-06-15 | Disposition: A | Discharge status: Home / Self Care | Payer: Medicare Other | Source: Ambulatory Visit | Attending: Family Medicine | Admitting: Family Medicine

## 2023-06-15 ENCOUNTER — Other Ambulatory Visit (HOSPITAL_COMMUNITY): Payer: Self-pay

## 2023-06-15 VITALS — BP 126/74 | HR 60 | Wt 345.8 lb

## 2023-06-15 DIAGNOSIS — Z79899 Other long term (current) drug therapy: Secondary | ICD-10-CM | POA: Insufficient documentation

## 2023-06-15 DIAGNOSIS — J449 Chronic obstructive pulmonary disease, unspecified: Secondary | ICD-10-CM | POA: Insufficient documentation

## 2023-06-15 DIAGNOSIS — I4892 Unspecified atrial flutter: Secondary | ICD-10-CM | POA: Insufficient documentation

## 2023-06-15 DIAGNOSIS — Z7982 Long term (current) use of aspirin: Secondary | ICD-10-CM | POA: Insufficient documentation

## 2023-06-15 DIAGNOSIS — Z7901 Long term (current) use of anticoagulants: Secondary | ICD-10-CM | POA: Insufficient documentation

## 2023-06-15 DIAGNOSIS — Z6841 Body Mass Index (BMI) 40.0 and over, adult: Secondary | ICD-10-CM | POA: Diagnosis not present

## 2023-06-15 DIAGNOSIS — I4819 Other persistent atrial fibrillation: Secondary | ICD-10-CM | POA: Diagnosis not present

## 2023-06-15 DIAGNOSIS — I5032 Chronic diastolic (congestive) heart failure: Secondary | ICD-10-CM

## 2023-06-15 DIAGNOSIS — Z8249 Family history of ischemic heart disease and other diseases of the circulatory system: Secondary | ICD-10-CM | POA: Diagnosis not present

## 2023-06-15 DIAGNOSIS — I110 Hypertensive heart disease with heart failure: Secondary | ICD-10-CM | POA: Diagnosis not present

## 2023-06-15 DIAGNOSIS — E119 Type 2 diabetes mellitus without complications: Secondary | ICD-10-CM | POA: Diagnosis not present

## 2023-06-15 DIAGNOSIS — I5022 Chronic systolic (congestive) heart failure: Secondary | ICD-10-CM | POA: Diagnosis present

## 2023-06-15 DIAGNOSIS — Z87891 Personal history of nicotine dependence: Secondary | ICD-10-CM | POA: Diagnosis not present

## 2023-06-15 DIAGNOSIS — Z951 Presence of aortocoronary bypass graft: Secondary | ICD-10-CM | POA: Diagnosis not present

## 2023-06-15 DIAGNOSIS — I2510 Atherosclerotic heart disease of native coronary artery without angina pectoris: Secondary | ICD-10-CM | POA: Diagnosis not present

## 2023-06-15 DIAGNOSIS — I480 Paroxysmal atrial fibrillation: Secondary | ICD-10-CM | POA: Diagnosis not present

## 2023-06-15 LAB — BASIC METABOLIC PANEL
Anion gap: 8 (ref 5–15)
BUN: 21 mg/dL (ref 8–23)
CO2: 31 mmol/L (ref 22–32)
Calcium: 9 mg/dL (ref 8.9–10.3)
Chloride: 98 mmol/L (ref 98–111)
Creatinine, Ser: 0.93 mg/dL (ref 0.61–1.24)
GFR, Estimated: >60 mL/min (ref >60)
Glucose, Bld: 163 mg/dL — ABNORMAL HIGH (ref 70–99)
Potassium: 4.4 mmol/L (ref 3.5–5.1)
Sodium: 137 mmol/L (ref 135–145)

## 2023-06-15 LAB — BRAIN NATRIURETIC PEPTIDE: B Natriuretic Peptide: 211.6 pg/mL — ABNORMAL HIGH (ref 0.0–100.0)

## 2023-06-15 MED ORDER — FUROSEMIDE 80 MG PO TABS: 80.0000 mg | ORAL_TABLET | Freq: Every day | ORAL | 0 refills | Status: DC

## 2023-06-15 MED ORDER — APIXABAN 5 MG PO TABS: 5.0000 mg | ORAL_TABLET | Freq: Two times a day (BID) | ORAL | 0 refills | Status: AC

## 2023-06-15 MED ORDER — SPIRONOLACTONE 25 MG PO TABS: 12.5000 mg | ORAL_TABLET | Freq: Every day | ORAL | 0 refills | Status: DC

## 2023-06-15 MED ORDER — CARVEDILOL 6.25 MG PO TABS: 6.2500 mg | ORAL_TABLET | Freq: Two times a day (BID) | ORAL | 0 refills | Status: DC

## 2023-06-15 MED ORDER — DAPAGLIFLOZIN PROPANEDIOL 10 MG PO TABS: 10.0000 mg | ORAL_TABLET | Freq: Every day | ORAL | 0 refills | Status: DC

## 2023-06-15 MED ORDER — ATORVASTATIN CALCIUM 80 MG PO TABS: 80.0000 mg | ORAL_TABLET | Freq: Every day | ORAL | 0 refills | Status: DC

## 2023-06-15 NOTE — Patient Instructions (Signed)
Labs done today. We will contact you only if your labs are abnormal.  START Arthur Church 10mg  (1 tablet) by mouth daily.   No other medication changes were made. Please continue all current medications as prescribed.  Thank you for allowing [place] to provide your heart failure care after your recent hospitalization. Please follow-up with General Cardiology.

## 2023-06-27 ENCOUNTER — Other Ambulatory Visit (HOSPITAL_COMMUNITY): Payer: Self-pay | Admitting: Family Medicine

## 2023-07-21 ENCOUNTER — Ambulatory Visit (HOSPITAL_COMMUNITY): Payer: Medicare Other

## 2023-07-21 ENCOUNTER — Encounter (HOSPITAL_COMMUNITY): Payer: Self-pay

## 2023-07-23 ENCOUNTER — Other Ambulatory Visit (HOSPITAL_COMMUNITY): Payer: Self-pay | Admitting: Family Medicine

## 2023-08-25 ENCOUNTER — Ambulatory Visit: Payer: Medicare Other | Admitting: Internal Medicine

## 2023-09-14 ENCOUNTER — Other Ambulatory Visit (HOSPITAL_COMMUNITY): Payer: Self-pay | Admitting: Family Medicine

## 2023-09-15 NOTE — Telephone Encounter (Signed)
Pt was last seen in the CHF clinic in July 2024. Please address

## 2023-10-20 ENCOUNTER — Other Ambulatory Visit (HOSPITAL_COMMUNITY): Payer: Self-pay | Admitting: Family Medicine

## 2023-10-21 ENCOUNTER — Other Ambulatory Visit (HOSPITAL_COMMUNITY): Payer: Self-pay | Admitting: Family Medicine

## 2023-11-16 ENCOUNTER — Other Ambulatory Visit (HOSPITAL_COMMUNITY): Payer: Self-pay | Admitting: Family Medicine

## 2023-11-22 ENCOUNTER — Other Ambulatory Visit (HOSPITAL_COMMUNITY): Payer: Self-pay | Admitting: Family Medicine

## 2023-12-29 ENCOUNTER — Encounter: Payer: Self-pay | Admitting: Gastroenterology

## 2024-01-13 ENCOUNTER — Other Ambulatory Visit (HOSPITAL_COMMUNITY): Payer: Self-pay | Admitting: Family Medicine

## 2024-01-20 ENCOUNTER — Encounter: Payer: Self-pay | Admitting: Gastroenterology

## 2024-01-20 ENCOUNTER — Ambulatory Visit: Payer: Medicare Other | Admitting: Gastroenterology

## 2024-01-20 VITALS — BP 134/88 | HR 63 | Ht 73.0 in | Wt 357.1 lb

## 2024-01-20 DIAGNOSIS — K6289 Other specified diseases of anus and rectum: Secondary | ICD-10-CM | POA: Insufficient documentation

## 2024-01-20 DIAGNOSIS — Z7901 Long term (current) use of anticoagulants: Secondary | ICD-10-CM | POA: Diagnosis not present

## 2024-01-20 DIAGNOSIS — K625 Hemorrhage of anus and rectum: Secondary | ICD-10-CM | POA: Diagnosis not present

## 2024-01-20 DIAGNOSIS — Z9981 Dependence on supplemental oxygen: Secondary | ICD-10-CM | POA: Insufficient documentation

## 2024-01-20 MED ORDER — SUFLAVE 178.7 G PO SOLR: 1.0000 | Freq: Once | ORAL | 0 refills | Status: AC

## 2024-01-20 NOTE — Progress Notes (Signed)
 01/20/2024 Arthur Church [id number] [birth year]   HISTORY OF PRESENT ILLNESS: This is a 68 year old male who is new to our office.  He is here today to discuss hemorrhoids.  He tells me that he has been having issues with what he thinks is hemorrhoids for about the past 3 months or so.  Says that his PCP examined him and prescribed some hydrocortisone suppositories, but they did not help.  Now is just using Desitin and that gives him some mild relief, but he says that they are just very bothersome/painful.  He has had some rectal bleeding but that has stopped at this point.  Says that he moves his bowels every day.  No abdominal pain. Tells me he had a Cologuard 3 years ago that was negative and just received his reminder to have that done again.  He has never had a colonoscopy in the past.  He is on Eliquis  due to history of atrial fibrillation.  Uses oxygen  at nighttime.  Past Medical History:  Diagnosis Date   Chronic systolic CHF (congestive heart failure) (HCC)    a. 2D ECHO (03/06/15) EF 20-25%, diffuse HK. Asc aortic diameter: 40mm. Mild LA dilation, mild RV dilation. Mild RV systolic dysfunction   b. LifeVest placed on 02/2015 admissoin    Coronary artery disease    a. 70% LAD lesion   Elevated TSH    ETOH abuse    Hypertension    Morbid obesity (HCC)    a. BMI ~46   Persistent atrial fibrillation (HCC)    a. newly dx 02/2015 admission. s/p successful TEE/DCCV  b. on Eliquis    Pre-diabetes    a. HgA1c 6.1   Past Surgical History:  Procedure Laterality Date   ATRIAL FIBRILLATION ABLATION N/A 01/28/2023   Procedure: ATRIAL FIBRILLATION ABLATION;  Surgeon: Inocencio Soyla Lunger, MD;  Location: [facility] INVASIVE CV LAB;  Service: Cardiovascular;  Laterality: N/A;   CARDIAC CATHETERIZATION N/A 04/26/2015   Procedure: Left Heart Cath and Coronary Angiography;  Surgeon: Victory LELON Sharps, MD;  Location: [facility] INVASIVE CV LAB;  Service: Cardiovascular;  Laterality: N/A;   CARDIOVERSION N/A 03/06/2015    Procedure: CARDIOVERSION;  Surgeon: Ezra GORMAN Shuck, MD;  Location: [facility] ENDOSCOPY;  Service: Cardiovascular;  Laterality: N/A;   CARDIOVERSION N/A 06/02/2022   Procedure: CARDIOVERSION;  Surgeon: Alveta Aleene PARAS, MD;  Location: [facility] ENDOSCOPY;  Service: Cardiovascular;  Laterality: N/A;   CARDIOVERSION N/A 07/14/2022   Procedure: CARDIOVERSION;  Surgeon: Francyne Headland, MD;  Location: [facility] ENDOSCOPY;  Service: Cardiovascular;  Laterality: N/A;   CORONARY ANGIOGRAPHY N/A 03/18/2022   Procedure: CORONARY ANGIOGRAPHY;  Surgeon: Verlin Lonni BIRCH, MD;  Location: [facility] INVASIVE CV LAB;  Service: Cardiovascular;  Laterality: N/A;   CORONARY ARTERY BYPASS GRAFT N/A 03/24/2022   Procedure: CORONARY ARTERY BYPASS GRAFTING (CABG) TIMES TWO USING LEFT INTERNAL MAMMARY ARTERY AND ENDOSCOPICALLY HARVESTED RIGHT GREATER SAPHENOUS VEIN;  Surgeon: Obadiah Coy, MD;  Location: [facility] OR;  Service: Open Heart Surgery;  Laterality: N/A;   ENDOVEIN HARVEST OF GREATER SAPHENOUS VEIN Right 03/24/2022   Procedure: ENDOVEIN HARVEST OF GREATER SAPHENOUS VEIN;  Surgeon: Obadiah Coy, MD;  Location: [facility] OR;  Service: Open Heart Surgery;  Laterality: Right;   HERNIA REPAIR     LEFT HEART CATH N/A 03/16/2022   Procedure: Left Heart Cath;  Surgeon: Claudene Pacific, MD;  Location: [facility] INVASIVE CV LAB;  Service: Cardiovascular;  Laterality: N/A;   TEE WITHOUT CARDIOVERSION N/A 03/06/2015   Procedure: TRANSESOPHAGEAL ECHOCARDIOGRAM (TEE);  Surgeon:  Ezra GORMAN Shuck, MD;  Location: [facility] ENDOSCOPY;  Service: Cardiovascular;  Laterality: N/A;   TEE WITHOUT CARDIOVERSION N/A 05/02/2015   Procedure: TRANSESOPHAGEAL ECHOCARDIOGRAM (TEE);  Surgeon: Redell GORMAN Shallow, MD;  Location: [facility] ENDOSCOPY;  Service: Cardiovascular;  Laterality: N/A;   TEE WITHOUT CARDIOVERSION N/A 03/24/2022   Procedure: TRANSESOPHAGEAL ECHOCARDIOGRAM (TEE);  Surgeon: Obadiah Coy, MD;  Location: [facility] OR;  Service: Open Heart Surgery;  Laterality: N/A;    reports that he quit  smoking about 11 years ago. His smoking use included cigarettes. He has never used smokeless tobacco. He reports current alcohol use. He reports that he does not use drugs. family history includes Breast cancer in his mother; Heart attack in his maternal grandfather and mother; Sleep apnea in his brother and brother; Uterine cancer in his sister. No Known Allergies    Outpatient Encounter Medications as of 01/20/2024  Medication Sig   acetaminophen  (TYLENOL ) 500 MG tablet Take 500 mg by mouth every 6 (six) hours as needed for mild pain, moderate pain or headache.   ALPRAZolam  (XANAX ) 0.5 MG tablet TAKE 1 TABLET BY MOUTH THREE TIMES DAILYAS NEEDED FOR ANXIETY   AMBULATORY NON FORMULARY MEDICATION Portable oxygen  concentrator, 3 L [place].  Please fax to [facility]   apixaban  (ELIQUIS ) 5 MG TABS tablet Take 1 tablet (5 mg total) by mouth 2 (two) times daily.   aspirin  EC 81 MG EC tablet Take 1 tablet (81 mg total) by mouth daily. Swallow whole.   atorvastatin  (LIPITOR ) 80 MG tablet TAKE 1 TABLET(80 MG) BY MOUTH DAILY   carvedilol  (COREG ) 6.25 MG tablet TAKE 1 TABLET(6.25 MG) BY MOUTH TWICE DAILY WITH A MEAL   furosemide  (LASIX ) 80 MG tablet TAKE 1 TABLET(80 MG) BY MOUTH DAILY (Patient taking differently: Take 40 mg by mouth daily.)   hydrocortisone 2.5 % cream Apply 1 Application topically daily as needed (rash, itching, irritation).   loratadine  (CLARITIN ) 10 MG tablet Take 10 mg by mouth daily.   Magnesium  400 MG TABS Take 400 mg by mouth 2 (two) times a day.   montelukast  (SINGULAIR ) 10 MG tablet Take 10 mg by mouth at bedtime.   OXYGEN  Inhale 2-3 L into the lungs at bedtime.   pantoprazole  (PROTONIX ) 40 MG tablet Take 1 tablet (40 mg total) by mouth daily.   sacubitril -valsartan  (ENTRESTO ) 24-26 MG Take 1 tablet by mouth 2 (two) times daily.   sildenafil  (REVATIO ) 20 MG tablet Take 2-3 tablets (40-60 mg total) by mouth as needed.   spironolactone  (ALDACTONE ) 25 MG tablet TAKE 1/2 TABLET(12.5 MG)  BY MOUTH DAILY (Patient taking differently: Take 12.5 mg by mouth every other day.)   TRELEGY ELLIPTA  100-62.5-25 MCG/ACT AEPB Inhale 1 puff into the lungs daily.   [DISCONTINUED] Artificial Tear Ointment (DRY EYES OP) Place 1 drop into both eyes 2 (two) times daily as needed (dry eyes).   [DISCONTINUED] dapagliflozin  propanediol (FARXIGA ) 10 MG TABS tablet TAKE 1 TABLET(10 MG) BY MOUTH DAILY BEFORE BREAKFAST   No facility-administered encounter medications on file as of 01/20/2024.    REVIEW OF SYSTEMS  : All other systems reviewed and negative except where noted in the History of Present Illness.   PHYSICAL EXAM: BP 134/88 (BP Location: Right Arm, Patient Position: Sitting, Cuff Size: Large)   Pulse 63   Ht 6' 1 (1.854 m)   Wt (!) 357 lb 2 oz (162 kg)   BMI 47.12 kg/m  General: Well developed white male in no acute distress Head: Normocephalic and atraumatic Eyes:  Sclerae anicteric,conjunctive pink. Ears: Normal auditory acuity Rectal: No external hemorrhoids noted.  There was a lot of light brown stool in the perianal area.  Posteriorly away from the anal canal there was an area of pinpoint tenderness that felt firm, no fistula tract opening seen.  There was some excoriation in the gluteal cleft. Musculoskeletal: Symmetrical with no gross deformities  Skin: No lesions on visible extremities Neurological: Alert oriented x 4, grossly non-focal Psychological:  Alert and cooperative. Normal mood and affect  ASSESSMENT AND PLAN: *Rectal bleeding and perianal pain: He has never had colonoscopy in the past.  Plan to schedule that with Dr. Nandigam at [facility] due to his oxygen  use.  I am concerned on exam that me he may have a perianal or perirectal abscess.  Very painful posteriorly in a pinpoint location, but away from the anal canal.  Feels very firm in that area.  No fistulous tract noted.  Will plan for MR pelvis if we can get that approved.  If not then we will change to CT  pelvis.  The risks, benefits, and alternatives to colonoscopy were discussed with the patient and he consents to proceed.  *Chronic anticoagulation with Eliquis  due to atrial fibrillation:  Will hold Eliquis  2 days prior to endoscopic procedures - will instruct when and how to resume after procedure. Benefits and risks of procedure explained including risks of bleeding, perforation, infection, missed lesions, reactions to medications and possible need for hospitalization and surgery for complications. Additional rare but real risk of stroke or other vascular clotting events off of Eliquis  also explained and need to seek urgent help if any signs of these problems occur. Will communicate by phone or EMR with patient's prescribing provider to confirm that holding Eliquis  is reasonable in this case.    CC:  Tammy Tari DASEN, PA-C

## 2024-01-20 NOTE — Patient Instructions (Addendum)
 You have been scheduled for an MRI at [facility] (1st floor radiology) on 01/28/24. Your appointment time is 4 pm. Please arrive to admitting (at main entrance of the hospital) 30 minutes prior to your appointment time for registration purposes. Please make certain not to have anything to eat or drink 6 hours prior to your test. In addition, if you have any metal in your body, have a pacemaker or defibrillator, please be sure to let your ordering physician know. This test typically takes 45 minutes to 1 hour to complete. Should you need to reschedule, please call [phone] to do so.    You have been scheduled for a colonoscopy. Please follow written instructions given to you at your visit today.   If you use inhalers (even only as needed), please bring them with you on the day of your procedure.  DO NOT TAKE 7 DAYS PRIOR TO TEST- Trulicity (dulaglutide) Ozempic, Wegovy (semaglutide) Mounjaro (tirzepatide) Bydureon Bcise (exanatide extended release)  DO NOT TAKE 1 DAY PRIOR TO YOUR TEST Rybelsus (semaglutide) Adlyxin (lixisenatide) Victoza (liraglutide) Byetta (exanatide) _______________________________________________________  If your blood pressure at your visit was 140/90 or greater, please contact your primary care physician to follow up on this.  _______________________________________________________  If you are age 34 or older, your body mass index should be between 23-30. Your Body mass index is 47.12 kg/m. If this is out of the aforementioned range listed, please consider follow up with your Primary Care Provider.  If you are age 18 or younger, your body mass index should be between 19-25. Your Body mass index is 47.12 kg/m. If this is out of the aformentioned range listed, please consider follow up with your Primary Care Provider.   ________________________________________________________  The [facility] providers would like to encourage you to use MYCHART to  communicate with providers for non-urgent requests or questions.  Due to long hold times on the telephone, sending your provider a message by [facility] may be a faster and more efficient way to get a response.  Please allow 48 business hours for a response.  Please remember that this is for non-urgent requests.  _______________________________________________________

## 2024-01-24 ENCOUNTER — Telehealth: Payer: Self-pay | Admitting: *Deleted

## 2024-01-24 NOTE — Telephone Encounter (Signed)
 [facility] Pre-operative Risk Assessment     Request for surgical clearance:     Endoscopy Procedure  What type of surgery is being performed?     Colonoscopy  When is this surgery scheduled?     02/24/24  What type of clearance is required ?   Pharmacy  Are there any medications that need to be held prior to surgery and how long? Eliquis  2 days  Practice name and name of physician performing surgery?      [facility]  What is your office phone and fax number?      Phone- [phone]  Fax- [phone]  Anesthesia type (None, local, MAC, general) ?       MAC   Please route your response to Monita Anon, CMA

## 2024-01-26 NOTE — Telephone Encounter (Signed)
Patient with diagnosis of afib on Eliquis for anticoagulation.    Procedure: Colonoscopy  Date of procedure: 02/24/24   CHA2DS2-VASc Score = 4   This indicates a 4.8% annual risk of stroke. The patient's score is based upon: CHF History: 1 HTN History: 1 Diabetes History: 0 (pre diabetes) Stroke History: 0 Vascular Disease History: 1 Age Score: 1 Gender Score: 0    CrCl >100 mL/min Platelet count 288 K    Per office protocol, patient can hold Eliquis  for 2 days prior to procedure.     **This guidance is not considered finalized until pre-operative APP has relayed final recommendations.**

## 2024-01-28 ENCOUNTER — Ambulatory Visit (HOSPITAL_COMMUNITY)
Admission: RE | Admit: 2024-01-28 | Discharge: 2024-01-28 | Disposition: A | Discharge status: Home / Self Care | Payer: Medicare Other | Source: Ambulatory Visit | Attending: Gastroenterology | Admitting: Gastroenterology

## 2024-01-28 DIAGNOSIS — K6289 Other specified diseases of anus and rectum: Secondary | ICD-10-CM | POA: Insufficient documentation

## 2024-01-28 DIAGNOSIS — K625 Hemorrhage of anus and rectum: Secondary | ICD-10-CM | POA: Insufficient documentation

## 2024-01-28 MED ORDER — GADOBUTROL 1 MMOL/ML IV SOLN
10.0000 mL | Freq: Once | INTRAVENOUS | Status: AC | PRN
  Administered 2024-01-28: 10 mL via INTRAVENOUS

## 2024-02-07 NOTE — Telephone Encounter (Signed)
   Patient Name: Arthur Church  DOB: [birth year] MRN: [id number]  Primary Cardiologist: Chrystie Nose, MD  Clinical pharmacists have reviewed the patient's past medical history, labs, and current medications as part of preoperative protocol coverage. The following recommendations have been made:  Per office protocol, patient can hold Eliquis  for 2 days prior to procedure.   I will route this recommendation to the requesting party via Epic fax function and remove from pre-op pool.  Please call with questions.  Napoleon Form, Leodis Rains, NP 02/07/2024, 2:49 PM

## 2024-02-07 NOTE — Telephone Encounter (Signed)
 Please update clearance.

## 2024-02-08 ENCOUNTER — Telehealth: Payer: Self-pay | Admitting: Gastroenterology

## 2024-02-08 NOTE — Telephone Encounter (Signed)
 Arthur Church the pt is calling to get MRI results that were done today.  He is concerned about the results. Please advise

## 2024-02-08 NOTE — Telephone Encounter (Signed)
 Inbound call from patient stating that he got his MRI results back and wanted to make sure that they ger read and he gets a call to discuss those results. Please advise.

## 2024-02-09 NOTE — Telephone Encounter (Signed)
 The patient has been notified of this information and all questions answered. He will await a call from our office after Dr Lavon Paganini responds

## 2024-02-10 ENCOUNTER — Telehealth: Payer: Self-pay

## 2024-02-10 NOTE — Telephone Encounter (Signed)
 [facility] referral has been made with records faxed.  The pt has been advised and all questions answered

## 2024-02-10 NOTE — Telephone Encounter (Signed)
-----   Message from [place] D. Zehr sent at 02/10/2024  4:30 PM EST ----- Dr. Lavon Paganini would like to go ahead and place referral to [facility] so that way he has an appointment in place and she said she can update based on colonoscopy findings.  Please place referral let the patient know. ----- Message ----- From: Napoleon Form, MD Sent: 02/10/2024   2:58 PM EST To: Leta Baptist, PA-C  Go ahead and refer the patient to surgery that way he has the appointment and I can update based on colonoscopy findings.  Thank you ----- Message ----- From: Leta Baptist, PA-C Sent: 02/08/2024  11:01 AM EST To: Napoleon Form, MD  Hello.  I saw this guy in clinic the other week.  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  He is scheduled for colonoscopy with you on 3/13 at [facility].  Should I refer him to surgery as well or await the results of the colonoscopy?  Thank you,  Jess ----- Message ----- From: Interface, Rad Results In Sent: 02/08/2024  10:06 AM EST To: Leta Baptist, PA-C

## 2024-02-14 NOTE — Telephone Encounter (Signed)
 Patient informed and voiced understanding

## 2024-02-18 ENCOUNTER — Ambulatory Visit: Payer: Medicare Other | Attending: Internal Medicine | Admitting: Internal Medicine

## 2024-02-18 VITALS — BP 118/88 | HR 84 | Ht 73.0 in | Wt 355.0 lb

## 2024-02-18 DIAGNOSIS — I255 Ischemic cardiomyopathy: Secondary | ICD-10-CM

## 2024-02-18 DIAGNOSIS — I10 Essential (primary) hypertension: Secondary | ICD-10-CM | POA: Diagnosis not present

## 2024-02-18 DIAGNOSIS — Z951 Presence of aortocoronary bypass graft: Secondary | ICD-10-CM | POA: Diagnosis not present

## 2024-02-18 DIAGNOSIS — I480 Paroxysmal atrial fibrillation: Secondary | ICD-10-CM

## 2024-02-18 MED ORDER — FUROSEMIDE 40 MG PO TABS: 40.0000 mg | ORAL_TABLET | Freq: Two times a day (BID) | ORAL | 1 refills | Status: DC

## 2024-02-18 NOTE — Patient Instructions (Signed)
 Medication Instructions:  STOP aspirin CONTINUE all other current meds -- list enclosed  *If you need a refill on your cardiac medications before your next appointment, please call your pharmacy*  Testing/Procedures: Your physician has requested that you have an echocardiogram. Echocardiography is a painless test that uses sound waves to create images of your heart. It provides your doctor with information about the size and shape of your heart and how well your heart's chambers and valves are working. This procedure takes approximately one hour. There are no restrictions for this procedure. Please do NOT wear cologne, perfume, aftershave, or lotions (deodorant is allowed). Please arrive 15 minutes prior to your appointment time.  Please note: We ask at that you not bring children with you during ultrasound (echo/ vascular) testing. Due to room size and safety concerns, children are not allowed in the ultrasound rooms during exams. Our front office staff cannot provide observation of children in our lobby area while testing is being conducted. An adult accompanying a patient to their appointment will only be allowed in the ultrasound room at the discretion of the ultrasound technician under special circumstances. We apologize for any inconvenience.  DUE MAY 2025   Follow-Up: At [facility], you and your health needs are our priority.  As part of our continuing mission to provide you with exceptional heart care, we have created designated Provider Care Teams.  These Care Teams include your primary Cardiologist (physician) and Advanced Practice Providers (APPs -  Physician Assistants and Nurse Practitioners) who all work together to provide you with the care you need, when you need it.  We recommend signing up for the patient portal called "MyChart".  Sign up information is provided on this After Visit Summary.  MyChart is used to connect with patients for Virtual Visits (Telemedicine).   Patients are able to view lab/test results, encounter notes, upcoming appointments, etc.  Non-urgent messages can be sent to your provider as well.   To learn more about what you can do with MyChart, go to [email or web address].    Your next appointment:    6 months with Dr. Rennis Golden  Other Instructions   1st Floor: - Lobby - Registration  - Pharmacy  - Lab - Cafe  2nd Floor: - PV Lab - Diagnostic Testing (echo, CT, nuclear med)  3rd Floor: - Vacant  4th Floor: - TCTS (cardiothoracic surgery) - AFib Clinic - Structural Heart Clinic - Vascular Surgery  - Vascular Ultrasound  5th Floor: - HeartCare Cardiology (general and EP) - Clinical Pharmacy for coumadin, hypertension, lipid, weight-loss medications, and med management appointments    Valet parking services will be available as well.
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
## 2024-02-19 ENCOUNTER — Other Ambulatory Visit (HOSPITAL_COMMUNITY): Payer: Self-pay | Admitting: Family Medicine

## 2024-02-24 ENCOUNTER — Encounter (HOSPITAL_COMMUNITY): Payer: Self-pay | Admitting: Gastroenterology

## 2024-02-24 ENCOUNTER — Ambulatory Visit (HOSPITAL_COMMUNITY): Admitting: Anesthesiology

## 2024-02-24 ENCOUNTER — Other Ambulatory Visit: Payer: Self-pay

## 2024-02-24 ENCOUNTER — Ambulatory Visit (HOSPITAL_COMMUNITY)
Admission: RE | Admit: 2024-02-24 | Discharge: 2024-02-24 | Disposition: A | Discharge status: Home / Self Care | Payer: Medicare Other | Attending: Gastroenterology | Admitting: Gastroenterology

## 2024-02-24 ENCOUNTER — Encounter (HOSPITAL_COMMUNITY)
Admission: RE | Disposition: A | Discharge status: Home / Self Care | Payer: Self-pay | Source: Home / Self Care | Attending: Gastroenterology

## 2024-02-24 DIAGNOSIS — D124 Benign neoplasm of descending colon: Secondary | ICD-10-CM | POA: Insufficient documentation

## 2024-02-24 DIAGNOSIS — Z79899 Other long term (current) drug therapy: Secondary | ICD-10-CM | POA: Insufficient documentation

## 2024-02-24 DIAGNOSIS — K921 Melena: Secondary | ICD-10-CM | POA: Insufficient documentation

## 2024-02-24 DIAGNOSIS — Z7901 Long term (current) use of anticoagulants: Secondary | ICD-10-CM | POA: Insufficient documentation

## 2024-02-24 DIAGNOSIS — I5022 Chronic systolic (congestive) heart failure: Secondary | ICD-10-CM | POA: Insufficient documentation

## 2024-02-24 DIAGNOSIS — K5730 Diverticulosis of large intestine without perforation or abscess without bleeding: Secondary | ICD-10-CM | POA: Diagnosis not present

## 2024-02-24 DIAGNOSIS — D123 Benign neoplasm of transverse colon: Secondary | ICD-10-CM | POA: Diagnosis not present

## 2024-02-24 DIAGNOSIS — I252 Old myocardial infarction: Secondary | ICD-10-CM | POA: Diagnosis not present

## 2024-02-24 DIAGNOSIS — I2510 Atherosclerotic heart disease of native coronary artery without angina pectoris: Secondary | ICD-10-CM | POA: Diagnosis not present

## 2024-02-24 DIAGNOSIS — Z6841 Body Mass Index (BMI) 40.0 and over, adult: Secondary | ICD-10-CM | POA: Insufficient documentation

## 2024-02-24 DIAGNOSIS — D125 Benign neoplasm of sigmoid colon: Secondary | ICD-10-CM | POA: Insufficient documentation

## 2024-02-24 DIAGNOSIS — K625 Hemorrhage of anus and rectum: Secondary | ICD-10-CM

## 2024-02-24 DIAGNOSIS — D128 Benign neoplasm of rectum: Secondary | ICD-10-CM

## 2024-02-24 DIAGNOSIS — D122 Benign neoplasm of ascending colon: Secondary | ICD-10-CM

## 2024-02-24 DIAGNOSIS — K604 Rectal fistula, unspecified: Secondary | ICD-10-CM | POA: Insufficient documentation

## 2024-02-24 DIAGNOSIS — I340 Nonrheumatic mitral (valve) insufficiency: Secondary | ICD-10-CM | POA: Insufficient documentation

## 2024-02-24 DIAGNOSIS — K648 Other hemorrhoids: Secondary | ICD-10-CM | POA: Insufficient documentation

## 2024-02-24 DIAGNOSIS — J449 Chronic obstructive pulmonary disease, unspecified: Secondary | ICD-10-CM | POA: Diagnosis not present

## 2024-02-24 DIAGNOSIS — Z951 Presence of aortocoronary bypass graft: Secondary | ICD-10-CM | POA: Insufficient documentation

## 2024-02-24 DIAGNOSIS — I4819 Other persistent atrial fibrillation: Secondary | ICD-10-CM | POA: Diagnosis not present

## 2024-02-24 DIAGNOSIS — E119 Type 2 diabetes mellitus without complications: Secondary | ICD-10-CM | POA: Insufficient documentation

## 2024-02-24 DIAGNOSIS — K644 Residual hemorrhoidal skin tags: Secondary | ICD-10-CM | POA: Insufficient documentation

## 2024-02-24 DIAGNOSIS — G709 Myoneural disorder, unspecified: Secondary | ICD-10-CM | POA: Insufficient documentation

## 2024-02-24 DIAGNOSIS — R011 Cardiac murmur, unspecified: Secondary | ICD-10-CM | POA: Diagnosis not present

## 2024-02-24 DIAGNOSIS — I110 Hypertensive heart disease with heart failure: Secondary | ICD-10-CM | POA: Diagnosis not present

## 2024-02-24 DIAGNOSIS — Z87891 Personal history of nicotine dependence: Secondary | ICD-10-CM | POA: Insufficient documentation

## 2024-02-24 DIAGNOSIS — K635 Polyp of colon: Secondary | ICD-10-CM

## 2024-02-24 DIAGNOSIS — E66813 Obesity, class 3: Secondary | ICD-10-CM | POA: Insufficient documentation

## 2024-02-24 DIAGNOSIS — K219 Gastro-esophageal reflux disease without esophagitis: Secondary | ICD-10-CM | POA: Insufficient documentation

## 2024-02-24 SURGERY — COLONOSCOPY WITH PROPOFOL
Anesthesia: Monitor Anesthesia Care

## 2024-02-24 MED ORDER — DEXMEDETOMIDINE HCL IN NACL 80 MCG/20ML IV SOLN
INTRAVENOUS | Status: DC | PRN
Start: 2024-02-24 — End: 2024-02-24
  Administered 2024-02-24: 8 ug via INTRAVENOUS

## 2024-02-24 MED ORDER — LIDOCAINE 2% (20 MG/ML) 5 ML SYRINGE
INTRAMUSCULAR | Status: DC | PRN
  Administered 2024-02-24: 100 mg via INTRAVENOUS

## 2024-02-24 MED ORDER — GLYCOPYRROLATE PF 0.2 MG/ML IJ SOSY
PREFILLED_SYRINGE | INTRAMUSCULAR | Status: DC | PRN
  Administered 2024-02-24: .2 mg via INTRAVENOUS

## 2024-02-24 MED ORDER — PHENYLEPHRINE 80 MCG/ML (10ML) SYRINGE FOR IV PUSH (FOR BLOOD PRESSURE SUPPORT)
PREFILLED_SYRINGE | INTRAVENOUS | Status: DC | PRN
  Administered 2024-02-24 (×9): 160 ug via INTRAVENOUS

## 2024-02-24 MED ORDER — SODIUM CHLORIDE 0.9 % IV SOLN: INTRAVENOUS | Status: DC

## 2024-02-24 MED ORDER — PROPOFOL 500 MG/50ML IV EMUL
INTRAVENOUS | Status: DC | PRN
  Administered 2024-02-24: 90 ug/kg/min via INTRAVENOUS

## 2024-02-24 MED ORDER — PROPOFOL 10 MG/ML IV BOLUS
INTRAVENOUS | Status: DC | PRN
  Administered 2024-02-24: 30 mg via INTRAVENOUS

## 2024-02-24 SURGICAL SUPPLY — 20 items

## 2024-02-24 NOTE — Anesthesia Preprocedure Evaluation (Addendum)
 Anesthesia Evaluation    Reviewed: Allergy & Precautions, H&P , Patient's Chart, lab work & pertinent test results  Airway Mallampati: II  TM Distance: >3 FB Neck ROM: Full    Dental  (+) Poor Dentition, Dental Advisory Given, Partial Upper,    Pulmonary shortness of breath, COPD,  COPD inhaler, former smoker   Pulmonary exam normal        Cardiovascular hypertension, Pt. on medications and Pt. on home beta blockers + CAD, + Past MI, + CABG and +CHF  + dysrhythmias Atrial Fibrillation + Valvular Problems/Murmurs MR  Rhythm:Irregular Rate:Normal   '24 TTE - EF 50%. The left ventricle has mildly decreased function. Global hypokinesis. Grade II diastolic dysfunction (pseudonormalization). Right ventricular systolic function is mildly reduced. Left atrial size was mild to moderately dilated. Mild mitral valve regurgitation (MR not fully visualized, could be worse). MR called moderate on echo in 2023  Cath 04/23:   Prox LAD to Mid LAD lesion is 85% stenosed.   Prox RCA lesion is 90% stenosed.   The left ventricular systolic function is normal.   LV end diastolic pressure is normal.    Neuro/Psych  Neuromuscular disease  negative psych ROS   GI/Hepatic ,GERD  Medicated and Controlled,,(+)     substance abuse  alcohol use  Endo/Other  diabetes, Type 2  Class 3 obesity  Renal/GU negative Renal ROS     Musculoskeletal negative musculoskeletal ROS (+)    Abdominal   Peds negative pediatric ROS (+)  Hematology  On eliquis    Anesthesia Other Findings   Reproductive/Obstetrics                             Anesthesia Physical Anesthesia Plan  ASA: 3  Anesthesia Plan: MAC   Post-op Pain Management: Minimal or no pain anticipated   Induction: Intravenous  PONV Risk Score and Plan: 1 and Propofol infusion and Treatment may vary due to age or medical condition  Airway Management Planned:  Natural Airway and Simple Face Mask  Additional Equipment: None  Intra-op Plan:   Post-operative Plan:   Informed Consent: I have reviewed the patients History and Physical, chart, labs and discussed the procedure including the risks, benefits and alternatives for the proposed anesthesia with the patient or authorized representative who has indicated his/her understanding and acceptance.       Plan Discussed with: CRNA and Anesthesiologist  Anesthesia Plan Comments:         Anesthesia Quick Evaluation

## 2024-02-24 NOTE — Op Note (Addendum)
 [facility] Patient Name: Arthur Church Procedure Date : 02/24/2024 MRN: [id number] Attending MD: Napoleon Form , MD, [id number] Date of Birth: 05/01/1956 CSN: [id number] Age: 68 Admit Type: Outpatient Procedure:                Colonoscopy Indications:              Evaluation of unexplained GI bleeding presenting                            with Hematochezia Providers:                Napoleon Form, MD, Martha Clan, RN,                            Alan Ripper, Technician Referring MD:              Medicines:                Monitored Anesthesia Care Complications:            No immediate complications. Estimated Blood Loss:     Estimated blood loss was minimal. Procedure:                Pre-Anesthesia Assessment:                           - Prior to the procedure, a History and Physical                            was performed, and patient medications and                            allergies were reviewed. The patient's tolerance of                            previous anesthesia was also reviewed. The risks                            and benefits of the procedure and the sedation                            options and risks were discussed with the patient.                            All questions were answered, and informed consent                            was obtained. Prior Anticoagulants: The patient                            last took Eliquis (apixaban) 2 days prior to the                            procedure. ASA Grade Assessment: IV - A patient  with severe systemic disease that is a constant                            threat to life. After reviewing the risks and                            benefits, the patient was deemed in satisfactory                            condition to undergo the procedure.                           After obtaining informed consent, the colonoscope                            was passed under  direct vision. Throughout the                            procedure, the patient's blood pressure, pulse, and                            oxygen saturations were monitored continuously. The                            CF-HQ190L ([id number]) Olympus coloscope was                            introduced through the anus and advanced to the the                            cecum, identified by appendiceal orifice and                            ileocecal valve. The colonoscopy was somewhat                            difficult due to poor bowel prep with stool                            present. The patient tolerated the procedure well.                            The quality of the bowel preparation was fair. The                            ileocecal valve, appendiceal orifice, and rectum                            were photographed. Scope In: 10:35:34 AM Scope Out: 11:21:38 AM Scope Withdrawal Time: 0 hours 37 minutes 27 seconds  Total Procedure Duration: 0 hours 46 minutes 4 seconds  Findings:      Hemorrhoids were found on perianal exam.      Two sessile polyps were found in the ascending colon. The polyps  were 6       to 8 mm in size. These polyps were removed with a cold snare. Resection       and retrieval were complete.      A 25 mm polyp was found in the ascending colon. The polyp was granular       lateral spreading. Preparations were made for mucosal resection.       Demarcation of the lesion was performed with high-definition white light       to clearly identify the boundaries of the lesion. 5 mL of EverLift was       injected with adequate lift of the lesion from the muscularis propria.       Snare mucosal resection with suction (via the working channel) retrieval       was performed. A 20 mm area was resected. Resection and retrieval were       complete. Resected tissue margins were examined and clear of polyp       tissue. Bleeding had stopped at the end of the procedure.      A 35 mm  polyp was found in the descending colon. The polyp was       pedunculated. The polyp was removed with a hot snare. Resection and       retrieval were complete.      Three semi-pedunculated polyps were found in the rectum, sigmoid colon       and transverse colon. The polyps were 10 to 15 mm in size. These polyps       were removed with a hot snare. Resection and retrieval were complete.      Scattered small-mouthed diverticula were found in the sigmoid colon and       descending colon.      Non-bleeding external and internal hemorrhoids were found during       retroflexion. The hemorrhoids were medium-sized. Impression:               - Preparation of the colon was fair.                           - Hemorrhoids found on perianal exam.                           - Two 6 to 8 mm polyps in the ascending colon,                            removed with a cold snare. Resected and retrieved.                           - One 25 mm polyp in the ascending colon, removed                            with mucosal resection. Resected and retrieved.                           - One 35 mm polyp in the descending colon, removed                            with a hot snare. Resected and retrieved.                           -  Three 10 to 15 mm polyps in the rectum, in the                            sigmoid colon and in the transverse colon, removed                            with a hot snare. Resected and retrieved.                           - Diverticulosis in the sigmoid colon and in the                            descending colon.                           - Non-bleeding external and internal hemorrhoids.                           - Mucosal resection was performed. Resection and                            retrieval were complete. Recommendation:           - Patient has a contact number available for                            emergencies. The signs and symptoms of potential                            delayed  complications were discussed with the                            patient. Return to normal activities tomorrow.                            Written discharge instructions were provided to the                            patient.                           - Resume previous diet.                           - Continue present medications.                           - Await pathology results.                           - Repeat colonoscopy next available within 3 months                            because the bowel preparation was suboptimal, for  surveillance after EMR.                           - For future colonoscopy the patient will require                            an extended preparation. If there are any                            questions, please contact the gastroenterologist.                           - Resume Eliquis (apixaban) at prior dose in 3 days. Procedure Code(s):        --- Professional ---                           [phone], Colonoscopy, flexible; with endoscopic                            mucosal resection                           [phone], 59, Colonoscopy, flexible; with removal of                            tumor(s), polyp(s), or other lesion(s) by snare                            technique Diagnosis Code(s):        --- Professional ---                           K64.8, Other hemorrhoids                           D12.2, Benign neoplasm of ascending colon                           D12.8, Benign neoplasm of rectum                           D12.5, Benign neoplasm of sigmoid colon                           D12.3, Benign neoplasm of transverse colon (hepatic                            flexure or splenic flexure)                           D12.4, Benign neoplasm of descending colon                           K92.1, Melena (includes Hematochezia)                           K57.30, Diverticulosis of large intestine without  perforation  or abscess without bleeding CPT copyright 2022 American Medical Association. All rights reserved. The codes documented in this report are preliminary and upon coder review may  be revised to meet current compliance requirements. Napoleon Form, MD 02/24/2024 11:31:31 AM This report has been signed electronically. Number of Addenda: 0

## 2024-02-24 NOTE — Transfer of Care (Signed)
 Immediate Anesthesia Transfer of Care Note  Patient: Arthur Church  Procedure(s) Performed: COLONOSCOPY WITH PROPOFOL POLYPECTOMY RESECTION, MUCOSAL LESION, GI TRACT, ENDOSCOPIC  Patient Location: PACU  Anesthesia Type:MAC  Level of Consciousness: awake, alert , and oriented  Airway & Oxygen Therapy: Patient Spontanous Breathing and Patient connected to face mask oxygen  Post-op Assessment: Report given to RN and Post -op Vital signs reviewed and stable  Post vital signs: Reviewed and stable  Last Vitals:  Vitals Value Taken Time  BP 102/72 02/24/24 1131  Temp    Pulse 91 02/24/24 1133  Resp 20 02/24/24 1133  SpO2 97 % 02/24/24 1133  Vitals shown include unfiled device data.  Last Pain:  Vitals:   02/24/24 0903  TempSrc: Temporal  PainSc: 0-No pain         Complications: No notable events documented.

## 2024-02-24 NOTE — Anesthesia Postprocedure Evaluation (Signed)
 Anesthesia Post Note  Patient: Arthur Church  Procedure(s) Performed: COLONOSCOPY WITH PROPOFOL POLYPECTOMY RESECTION, MUCOSAL LESION, GI TRACT, ENDOSCOPIC     Patient location during evaluation: PACU Anesthesia Type: MAC Level of consciousness: awake and alert Pain management: pain level controlled Vital Signs Assessment: post-procedure vital signs reviewed and stable Respiratory status: spontaneous breathing, nonlabored ventilation and respiratory function stable Cardiovascular status: stable and blood pressure returned to baseline Anesthetic complications: no   No notable events documented.  Last Vitals:  Vitals:   02/24/24 1130 02/24/24 1145  BP: 102/72 122/77  Pulse: 88 89  Resp: 18 (!) 22  Temp: (!) 36.2 C (!) 36.2 C  SpO2: 98% 92%    Last Pain:  Vitals:   02/24/24 1145  TempSrc:   PainSc: 0-No pain                 Beryle Lathe

## 2024-02-24 NOTE — H&P (Signed)
 [facility] History and Physical   Primary Care Physician:  Chyrel Masson   Reason for Procedure:   Rectal bleeding, rectal fistula, anorectal pain  Plan:    Colonoscopy with possible intervention     HPI: Arthur Church is a 68 y.o. male here for colonoscopy for evaluation of rectal bleeding, rectal pain, MRI suggestive of intersphincter fistula.   The risks and benefits as well as alternatives of endoscopic procedure(s) have been discussed and reviewed. All questions answered. The patient agrees to proceed.    Past Medical History:  Diagnosis Date   Chronic systolic CHF (congestive heart failure) (HCC)    a. 2D ECHO (03/06/15) EF 20-25%, diffuse HK. Asc aortic diameter: 40mm. Mild LA dilation, mild RV dilation. Mild RV systolic dysfunction   b. LifeVest placed on 02/2015 admissoin    Coronary artery disease    a. 70% LAD lesion   Elevated TSH    ETOH abuse    Hypertension    Morbid obesity (HCC)    a. BMI ~46   Persistent atrial fibrillation (HCC)    a. newly dx 02/2015 admission. s/p successful TEE/DCCV  b. on Eliquis   Pre-diabetes    a. HgA1c 6.1    Past Surgical History:  Procedure Laterality Date   ATRIAL FIBRILLATION ABLATION N/A 01/28/2023   Procedure: ATRIAL FIBRILLATION ABLATION;  Surgeon: Regan Lemming, MD;  Location: [facility] INVASIVE CV LAB;  Service: Cardiovascular;  Laterality: N/A;   CARDIAC CATHETERIZATION N/A 04/26/2015   Procedure: Left Heart Cath and Coronary Angiography;  Surgeon: Lyn Records, MD;  Location: [facility] INVASIVE CV LAB;  Service: Cardiovascular;  Laterality: N/A;   CARDIOVERSION N/A 03/06/2015   Procedure: CARDIOVERSION;  Surgeon: Laurey Morale, MD;  Location: [facility] ENDOSCOPY;  Service: Cardiovascular;  Laterality: N/A;   CARDIOVERSION N/A 06/02/2022   Procedure: CARDIOVERSION;  Surgeon: Vesta Mixer, MD;  Location: [facility] ENDOSCOPY;  Service: Cardiovascular;  Laterality: N/A;   CARDIOVERSION N/A 07/14/2022   Procedure:  CARDIOVERSION;  Surgeon: Thurmon Fair, MD;  Location: [facility] ENDOSCOPY;  Service: Cardiovascular;  Laterality: N/A;   CORONARY ANGIOGRAPHY N/A 03/18/2022   Procedure: CORONARY ANGIOGRAPHY;  Surgeon: Kathleene Hazel, MD;  Location: [facility] INVASIVE CV LAB;  Service: Cardiovascular;  Laterality: N/A;   CORONARY ARTERY BYPASS GRAFT N/A 03/24/2022   Procedure: CORONARY ARTERY BYPASS GRAFTING (CABG) TIMES TWO USING LEFT INTERNAL MAMMARY ARTERY AND ENDOSCOPICALLY HARVESTED RIGHT GREATER SAPHENOUS VEIN;  Surgeon: Lovett Sox, MD;  Location: [facility] OR;  Service: Open Heart Surgery;  Laterality: N/A;   ENDOVEIN HARVEST OF GREATER SAPHENOUS VEIN Right 03/24/2022   Procedure: ENDOVEIN HARVEST OF GREATER SAPHENOUS VEIN;  Surgeon: Lovett Sox, MD;  Location: [facility] OR;  Service: Open Heart Surgery;  Laterality: Right;   HERNIA REPAIR     LEFT HEART CATH N/A 03/16/2022   Procedure: Left Heart Cath;  Surgeon: [name], MD;  Location: [facility] INVASIVE CV LAB;  Service: Cardiovascular;  Laterality: N/A;   TEE WITHOUT CARDIOVERSION N/A 03/06/2015   Procedure: TRANSESOPHAGEAL ECHOCARDIOGRAM (TEE);  Surgeon: Laurey Morale, MD;  Location: [facility] ENDOSCOPY;  Service: Cardiovascular;  Laterality: N/A;   TEE WITHOUT CARDIOVERSION N/A 05/02/2015   Procedure: TRANSESOPHAGEAL ECHOCARDIOGRAM (TEE);  Surgeon: Lewayne Bunting, MD;  Location: [facility] ENDOSCOPY;  Service: Cardiovascular;  Laterality: N/A;   TEE WITHOUT CARDIOVERSION N/A 03/24/2022   Procedure: TRANSESOPHAGEAL ECHOCARDIOGRAM (TEE);  Surgeon: Lovett Sox, MD;  Location: [facility] OR;  Service: Open Heart Surgery;  Laterality: N/A;    Prior to  Admission medications   Medication Sig Start Date End Date Taking? Authorizing Provider  acetaminophen (TYLENOL) 500 MG tablet Take 500 mg by mouth every 6 (six) hours as needed for mild pain, moderate pain or headache.   Yes [provider]  ALPRAZolam Prudy Feeler) 0.5 MG tablet TAKE 1 TABLET BY MOUTH THREE TIMES DAILYAS NEEDED FOR  ANXIETY 08/04/19  Yes Monica Becton, MD  apixaban (ELIQUIS) 5 MG TABS tablet Take 1 tablet (5 mg total) by mouth 2 (two) times daily. 06/15/23  Yes Milford, Anderson Malta, FNP  atorvastatin (LIPITOR) 80 MG tablet TAKE 1 TABLET(80 MG) BY MOUTH DAILY 09/15/23  Yes Hilty, Lisette Abu, MD  carvedilol (COREG) 6.25 MG tablet TAKE 1 TABLET(6.25 MG) BY MOUTH TWICE DAILY WITH A MEAL 02/22/24  Yes Milford, Anderson Malta, FNP  furosemide (LASIX) 40 MG tablet Take 1 tablet (40 mg total) by mouth 2 (two) times daily. 02/18/24  Yes Hilty, Lisette Abu, MD  loratadine (CLARITIN) 10 MG tablet Take 10 mg by mouth daily.   Yes [provider]  Magnesium 400 MG TABS Take 400 mg by mouth 2 (two) times a day. 07/14/19  Yes Hilty, Lisette Abu, MD  montelukast (SINGULAIR) 10 MG tablet Take 10 mg by mouth at bedtime. 01/14/22  Yes [provider]  pantoprazole (PROTONIX) 40 MG tablet Take 1 tablet (40 mg total) by mouth daily. 11/30/19  Yes Monica Becton, MD  sacubitril-valsartan (ENTRESTO) 24-26 MG Take 1 tablet by mouth 2 (two) times daily. 05/31/23  Yes Andrey Farmer, PA-C  spironolactone (ALDACTONE) 25 MG tablet TAKE 1/2 TABLET(12.5 MG) BY MOUTH DAILY Patient taking differently: Take 12.5 mg by mouth every other day. 01/14/24  Yes Hilty, Lisette Abu, MD  TRELEGY ELLIPTA 100-62.5-25 MCG/ACT AEPB Inhale 1 puff into the lungs daily. 02/18/22  Yes [provider]  AMBULATORY NON FORMULARY MEDICATION Portable oxygen concentrator, 3 L [place].  Please fax to [facility] 03/17/19   Monica Becton, MD  hydrocortisone 2.5 % cream Apply 1 Application topically daily as needed (rash, itching, irritation).    [provider]  OXYGEN Inhale 2-3 L into the lungs at bedtime.    [provider]    Current Facility-Administered Medications  Medication Dose Route Frequency Provider Last Rate Last Admin   0.9 %  sodium chloride infusion   Intravenous Continuous Zehr, Jessica D, PA-C         Allergies as of 01/20/2024   (No Known Allergies)    Family History  Problem Relation Age of Onset   Heart attack Mother    Breast cancer Mother    Uterine cancer Sister    Sleep apnea Brother    Sleep apnea Brother    Heart attack Maternal Grandfather    Colon cancer Neg Hx    Liver cancer Neg Hx    Stomach cancer Neg Hx    Esophageal cancer Neg Hx     Social History   Socioeconomic History   Marital status: Widowed    Spouse name: Not on file   Number of children: 1   Years of education: Not on file   Highest education level: Not on file  Occupational History   Occupation: security guard  Tobacco Use   Smoking status: Former    Current packs/day: 0.00    Types: Cigarettes    Quit date: 01/02/2013    Years since quitting: 11.1   Smokeless tobacco: Never   Tobacco comments:    smokes about a  week  Vaping Use   Vaping status: Never Used  Substance and Sexual Activity   Alcohol use: Yes    Comment: Occassional   Drug use: No   Sexual activity: Not Currently  Other Topics Concern   Not on file  Social History Narrative   Mr Cefalu is a 68 year old male 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 He reports he is independent with all his care needs and transportation to medical appointments   He is stating he is to either retire or go out on disability soon.    His daughter, Hassel Neth is a SW per pt   Social Drivers of Corporate investment banker Strain: Low Risk  (04/20/2023)   Overall Financial Resource Strain (CARDIA)    Difficulty of Paying Living Expenses: Not hard at all  Food Insecurity: Low Risk  (08/12/2023)   Received from [facility]   Hunger Vital Sign    Worried About Running Out of Food in the Last Year: Never true    Ran Out of Food in the Last Year: Never true  Transportation Needs: No Transportation Needs (08/12/2023)   Received from [facility]    In the past 12 months, has lack of reliable transportation kept  you from medical appointments, meetings, work or from getting things needed for daily living? : No  Physical Activity: Not on file  Stress: Not on file  Social Connections: Unknown (07/18/2019)   Social Connection and Isolation Panel [NHANES]    Frequency of Communication with Friends and Family: Not on file    Frequency of Social Gatherings with Friends and Family: Not on file    Attends Religious Services: Not on file    Active Member of Clubs or Organizations: Not on file    Attends Banker Meetings: Not on file    Marital Status: Widowed  Intimate Partner Violence: Not on file    Review of Systems:  All other review of systems negative except as mentioned in the HPI.  Physical Exam: Vital signs in last 24 hours: Temp:  [97.3 F (36.3 C)] 97.3 F (36.3 C) (03/13 0903) Resp:  [23] 23 (03/13 0903) BP: (113)/(60) 113/60 (03/13 0903) SpO2:  [95 %] 95 % (03/13 0903) Weight:  [158.8 kg] 158.8 kg (03/13 0903)   General:   Alert, NAD Lungs:  Clear .   Heart:  Regular rate and rhythm Abdomen:  Soft, nontender and nondistended. Neuro/Psych:  Alert and cooperative. Normal mood and affect. A and O x 3   K. Scherry Ran , MD [phone]

## 2024-02-24 NOTE — Interval H&P Note (Signed)
 History and Physical Interval Note:  02/24/2024 9:32 AM  Arthur Church  has presented today for surgery, with the diagnosis of rectal bleeding.  The various methods of treatment have been discussed with the patient and family. After consideration of risks, benefits and other options for treatment, the patient has consented to  Procedure(s): COLONOSCOPY WITH PROPOFOL (N/A) as a surgical intervention.  The patient's history has been reviewed, patient examined, no change in status, stable for surgery.  I have reviewed the patient's chart and labs.  Questions were answered to the patient's satisfaction.     [name]

## 2024-02-25 LAB — SURGICAL PATHOLOGY

## 2024-02-28 ENCOUNTER — Encounter (HOSPITAL_COMMUNITY): Payer: Self-pay | Admitting: Gastroenterology

## 2024-03-01 ENCOUNTER — Other Ambulatory Visit: Payer: Self-pay

## 2024-03-01 ENCOUNTER — Encounter: Payer: Self-pay | Admitting: Gastroenterology

## 2024-03-01 DIAGNOSIS — D126 Benign neoplasm of colon, unspecified: Secondary | ICD-10-CM

## 2024-03-01 MED ORDER — SUFLAVE 178.7 G PO SOLR: 1.0000 | Freq: Once | ORAL | 0 refills | Status: AC

## 2024-03-06 ENCOUNTER — Ambulatory Visit
Admission: RE | Admit: 2024-03-06 | Discharge: 2024-03-06 | Disposition: A | Discharge status: Home / Self Care | Source: Ambulatory Visit | Attending: Family Medicine | Admitting: Family Medicine

## 2024-03-06 VITALS — BP 152/104 | HR 77 | Temp 97.5°F | Resp 21

## 2024-03-06 DIAGNOSIS — M5441 Lumbago with sciatica, right side: Secondary | ICD-10-CM

## 2024-03-06 DIAGNOSIS — Z7901 Long term (current) use of anticoagulants: Secondary | ICD-10-CM

## 2024-03-06 MED ORDER — PREDNISONE 50 MG PO TABS: ORAL_TABLET | ORAL | 0 refills | Status: DC

## 2024-03-06 MED ORDER — OXYCODONE HCL 5 MG PO TABS
5.0000 mg | ORAL_TABLET | Freq: Four times a day (QID) | ORAL | 0 refills | Status: DC | PRN
Start: 2024-03-06 — End: 2024-03-25

## 2024-03-06 NOTE — Discharge Instructions (Signed)
 May continue to take Tylenol for moderate pain Take oxycodone as needed for severe pain.  Start with 1, may repeat x 1 if not effective to take 1 or 2 every 6 hours Do not drive on oxycodone Oxycodone can cause constipation.  Make sure you are eating enough fiber and drinking enough water I am also prescribing prednisone.  The anti-inflammatory will help with your pinched nerve symptoms.  Take this once a day Follow-up with your usual primary care doctor

## 2024-03-06 NOTE — ED Provider Notes (Signed)
 [facility] C[facility]    CSN: [id number] Arrival date & time: 03/06/24  1547      History   Chief Complaint Chief Complaint  Patient presents with   Back Pain    Lower    HPI DAVAUGHN HILLYARD is a 68 y.o. male.   Patient is here complaining of low back pain.  It hurts in his right low back.  The pain goes from his right low back down the back of his right leg.  He is tingling that goes all the way to his feet.  He had no injury.  No accident.  No fall.  He states that he has never had any back problems or back condition before.  He has been taking acetaminophen for the pain.  He ranks the pain as "severe". The patient is on Eliquis for atrial fibrillation.  He knows not to take any anti-inflammatory drugs. Patient has a history of heart disease.  Lung disease and COPD.  Hypertension and hyperlipidemia.  Morbid obesity.  He is under the care of primary, cardiology, pulmonary, and gastroenterology.  He recently had a colonoscopy and they found multiple polyps.  He has a follow-up scheduled with a colon surgeon.    Past Medical History:  Diagnosis Date   Chronic systolic CHF (congestive heart failure) (HCC)    a. 2D ECHO (03/06/15) EF 20-25%, diffuse HK. Asc aortic diameter: 40mm. Mild LA dilation, mild RV dilation. Mild RV systolic dysfunction   b. LifeVest placed on 02/2015 admissoin    Coronary artery disease    a. 70% LAD lesion   Elevated TSH    ETOH abuse    Hypertension    Morbid obesity (HCC)    a. BMI ~46   Persistent atrial fibrillation (HCC)    a. newly dx 02/2015 admission. s/p successful TEE/DCCV  b. on Eliquis   Pre-diabetes    a. HgA1c 6.1    Patient Active Problem List   Diagnosis Date Noted   Adenomatous polyp of transverse colon 02/24/2024   Adenomatous polyp of rectum 02/24/2024   Adenomatous polyp of sigmoid colon 02/24/2024   Adenomatous polyp of ascending colon 02/24/2024   Rectal bleeding 01/20/2024   Perianal pain 01/20/2024   Chronic  anticoagulation 01/20/2024   Oxygen dependent 01/20/2024   Hemoptysis 04/22/2023   CAP (community acquired pneumonia) 04/19/2023   Hypercoagulable state due to persistent atrial fibrillation (HCC) 03/18/2023   Midline sternotomy scar 05/18/2022   Acute on chronic HFrEF (heart failure with reduced ejection fraction) (HCC) 05/15/2022   Atrial fibrillation (HCC) 05/15/2022   Chronic combined systolic and diastolic heart failure (HCC) 05/15/2022   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   NSTEMI (non-ST elevated myocardial infarction) (HCC)    Acute coronary syndrome (HCC) 03/14/2022   COPD with chronic bronchitis and emphysema (HCC) 08/31/2019   Shortness of breath 07/17/2019   Medication management 07/17/2019   Radiculitis of left cervical region 04/03/2019   Diabetes mellitus type 2, diet-controlled (HCC) 04/03/2019   LPRD (laryngopharyngeal reflux disease) 02/03/2019   Grieving 04/18/2018   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/2017   Annual physical exam 05/17/2017   Chronic respiratory failure with hypoxia (HCC) 05/17/2017   Varicose veins 09/06/2015   Essential hypertension 06/28/2015   PAF (paroxysmal atrial fibrillation) (HCC) 05/03/2015   Non-ischemic cardiomyopathy (HCC) 05/03/2015   Rosacea 04/03/2015   Psoriasis 04/03/2015   Morbid obesity (HCC) 03/05/2015   ETOH abuse 03/05/2015    Past Surgical History:  Procedure Laterality Date  ATRIAL FIBRILLATION ABLATION N/A 01/28/2023   Procedure: ATRIAL FIBRILLATION ABLATION;  Surgeon: Regan Lemming, MD;  Location: [facility] INVASIVE CV LAB;  Service: Cardiovascular;  Laterality: N/A;   CARDIAC CATHETERIZATION N/A 04/26/2015   Procedure: Left Heart Cath and Coronary Angiography;  Surgeon: Lyn Records, MD;  Location: [facility] INVASIVE CV LAB;  Service: Cardiovascular;  Laterality: N/A;   CARDIOVERSION N/A 03/06/2015   Procedure: CARDIOVERSION;  Surgeon: Laurey Morale, MD;  Location: [facility] ENDOSCOPY;  Service: Cardiovascular;  Laterality: N/A;    CARDIOVERSION N/A 06/02/2022   Procedure: CARDIOVERSION;  Surgeon: Vesta Mixer, MD;  Location: [facility] ENDOSCOPY;  Service: Cardiovascular;  Laterality: N/A;   CARDIOVERSION N/A 07/14/2022   Procedure: CARDIOVERSION;  Surgeon: Thurmon Fair, MD;  Location: [facility] ENDOSCOPY;  Service: Cardiovascular;  Laterality: N/A;   COLONOSCOPY WITH PROPOFOL N/A 02/24/2024   Procedure: COLONOSCOPY WITH PROPOFOL;  Surgeon: Napoleon Form, MD;  Location: [facility] ENDOSCOPY;  Service: Gastroenterology;  Laterality: N/A;   CORONARY ANGIOGRAPHY N/A 03/18/2022   Procedure: CORONARY ANGIOGRAPHY;  Surgeon: Kathleene Hazel, MD;  Location: [facility] INVASIVE CV LAB;  Service: Cardiovascular;  Laterality: N/A;   CORONARY ARTERY BYPASS GRAFT N/A 03/24/2022   Procedure: CORONARY ARTERY BYPASS GRAFTING (CABG) TIMES TWO USING LEFT INTERNAL MAMMARY ARTERY AND ENDOSCOPICALLY HARVESTED RIGHT GREATER SAPHENOUS VEIN;  Surgeon: Lovett Sox, MD;  Location: [facility] OR;  Service: Open Heart Surgery;  Laterality: N/A;   ENDOSCOPIC MUCOSAL RESECTION  02/24/2024   Procedure: RESECTION, MUCOSAL LESION, GI TRACT, ENDOSCOPIC;  Surgeon: Napoleon Form, MD;  Location: [facility] ENDOSCOPY;  Service: Gastroenterology;;   ENDOVEIN HARVEST OF GREATER SAPHENOUS VEIN Right 03/24/2022   Procedure: ENDOVEIN HARVEST OF GREATER SAPHENOUS VEIN;  Surgeon: Lovett Sox, MD;  Location: [facility] OR;  Service: Open Heart Surgery;  Laterality: Right;   HERNIA REPAIR     LEFT HEART CATH N/A 03/16/2022   Procedure: Left Heart Cath;  Surgeon: [name], MD;  Location: [facility] INVASIVE CV LAB;  Service: Cardiovascular;  Laterality: N/A;   POLYPECTOMY  02/24/2024   Procedure: POLYPECTOMY;  Surgeon: Napoleon Form, MD;  Location: [facility] ENDOSCOPY;  Service: Gastroenterology;;   TEE WITHOUT CARDIOVERSION N/A 03/06/2015   Procedure: TRANSESOPHAGEAL ECHOCARDIOGRAM (TEE);  Surgeon: Laurey Morale, MD;  Location: [facility] ENDOSCOPY;  Service: Cardiovascular;  Laterality: N/A;   TEE WITHOUT  CARDIOVERSION N/A 05/02/2015   Procedure: TRANSESOPHAGEAL ECHOCARDIOGRAM (TEE);  Surgeon: Lewayne Bunting, MD;  Location: [facility] ENDOSCOPY;  Service: Cardiovascular;  Laterality: N/A;   TEE WITHOUT CARDIOVERSION N/A 03/24/2022   Procedure: TRANSESOPHAGEAL ECHOCARDIOGRAM (TEE);  Surgeon: Lovett Sox, MD;  Location: [facility] OR;  Service: Open Heart Surgery;  Laterality: N/A;       Home Medications    Prior to Admission medications   Medication Sig Start Date End Date Taking? Authorizing Provider  oxyCODONE (OXY IR/ROXICODONE) 5 MG immediate release tablet Take 1-2 tablets (5-10 mg total) by mouth every 6 (six) hours as needed for severe pain (pain score 7-10). 03/06/24  Yes Eustace Moore, MD  predniSONE (DELTASONE) 50 MG tablet Take once a day for 5 days.  Take with food 03/06/24  Yes Eustace Moore, MD  acetaminophen (TYLENOL) 500 MG tablet Take 500 mg by mouth every 6 (six) hours as needed for mild pain, moderate pain or headache.    [provider]  ALPRAZolam Prudy Feeler) 0.5 MG tablet TAKE 1 TABLET BY MOUTH THREE TIMES DAILYAS NEEDED FOR ANXIETY 08/04/19   Monica Becton, MD  AMBULATORY NON FORMULARY MEDICATION Portable  oxygen concentrator, 3 L [place].  Please fax to [facility] 03/17/19   Monica Becton, MD  apixaban (ELIQUIS) 5 MG TABS tablet Take 1 tablet (5 mg total) by mouth 2 (two) times daily. 06/15/23   Jacklynn Ganong, FNP  atorvastatin (LIPITOR) 80 MG tablet TAKE 1 TABLET(80 MG) BY MOUTH DAILY 09/15/23   Hilty, Lisette Abu, MD  carvedilol (COREG) 6.25 MG tablet TAKE 1 TABLET(6.25 MG) BY MOUTH TWICE DAILY WITH A MEAL 02/22/24   [place], Anderson Malta, FNP  furosemide (LASIX) 40 MG tablet Take 1 tablet (40 mg total) by mouth 2 (two) times daily. 02/18/24   Hilty, Lisette Abu, MD  hydrocortisone 2.5 % cream Apply 1 Application topically daily as needed (rash, itching, irritation).    [provider]  loratadine (CLARITIN) 10 MG tablet Take 10 mg by mouth daily.     [provider]  Magnesium 400 MG TABS Take 400 mg by mouth 2 (two) times a day. 07/14/19   Hilty, Lisette Abu, MD  montelukast (SINGULAIR) 10 MG tablet Take 10 mg by mouth at bedtime. 01/14/22   [provider]  OXYGEN Inhale 2-3 L into the lungs at bedtime.    [provider]  pantoprazole (PROTONIX) 40 MG tablet Take 1 tablet (40 mg total) by mouth daily. 11/30/19   Monica Becton, MD  sacubitril-valsartan (ENTRESTO) 24-26 MG Take 1 tablet by mouth 2 (two) times daily. 05/31/23   Andrey Farmer, PA-C  spironolactone (ALDACTONE) 25 MG tablet TAKE 1/2 TABLET(12.5 MG) BY MOUTH DAILY Patient taking differently: Take 12.5 mg by mouth every other day. 01/14/24   Hilty, Lisette Abu, MD  TRELEGY ELLIPTA 100-62.5-25 MCG/ACT AEPB Inhale 1 puff into the lungs daily. 02/18/22   [provider]    Family History Family History  Problem Relation Age of Onset   Heart attack Mother    Breast cancer Mother    Uterine cancer Sister    Sleep apnea Brother    Sleep apnea Brother    Heart attack Maternal Grandfather    Colon cancer Neg Hx    Liver cancer Neg Hx    Stomach cancer Neg Hx    Esophageal cancer Neg Hx     Social History Social History   Tobacco Use   Smoking status: Former    Current packs/day: 0.00    Types: Cigarettes    Quit date: 01/02/2013    Years since quitting: 11.1   Smokeless tobacco: Never   Tobacco comments:    smokes about a week  Vaping Use   Vaping status: Never Used  Substance Use Topics   Alcohol use: Yes    Comment: Occassional   Drug use: No     Allergies   Patient has no known allergies.   Review of Systems Review of Systems  See HPI Physical Exam Triage Vital Signs ED Triage Vitals  Encounter Vitals Group     BP 03/06/24 1558 (!) 152/104     Systolic BP Percentile --      Diastolic BP Percentile --      Pulse Rate 03/06/24 1558 77     Resp 03/06/24 1558 (!) 21     Temp 03/06/24 1558 (!) 97.5 F (36.4  C)     Temp Source 03/06/24 1558 Oral     SpO2 03/06/24 1558 95 %     Weight --      Height --      Head Circumference --  Peak Flow --      Pain Score 03/06/24 1556 9     Pain Loc --      Pain Education --      Exclude from Growth Chart --    No data found.  Updated Vital Signs BP (!) 152/104 (BP Location: Left Wrist)   Pulse 77   Temp (!) 97.5 F (36.4 C) (Oral)   Resp (!) 21   SpO2 95%       Physical Exam Constitutional:      General: He is in acute distress.     Appearance: He is well-developed. He is obese.     Comments: Acutely uncomfortable.  Cannot get on exam table.  BMI 46  HENT:     Head: Normocephalic and atraumatic.  Eyes:     Conjunctiva/sclera: Conjunctivae normal.     Pupils: Pupils are equal, round, and reactive to light.  Cardiovascular:     Rate and Rhythm: Normal rate.  Pulmonary:     Effort: Pulmonary effort is normal.  Musculoskeletal:        General: Tenderness present. Normal range of motion.     Cervical back: Normal range of motion.     Comments: Tenderness in the lower lumbar L5-S1 centrally and towards the right S1.  No palpable muscle spasm.  Range of motion not able to obtain.  Reflexes are trace and equal at the knee.  Straight leg raise on the right causes increased right leg pain.  Straight leg raise on the left causes increased right buttock pain  Skin:    General: Skin is warm and dry.  Neurological:     Mental Status: He is alert.      UC Treatments / Results  Labs (all labs ordered are listed, but only abnormal results are displayed) Labs Reviewed - No data to display  EKG   Radiology No results found.  Procedures Procedures (including critical care time)  Medications Ordered in UC Medications - No data to display  Initial Impression / Assessment and Plan / UC Course  I have reviewed the triage vital signs and the nursing notes.  Pertinent labs & imaging results that were available during my care of the  patient were reviewed by me and considered in my medical decision making (see chart for details).     Patient has severe pain with radiculopathy.  Physical exam suggest she could have discogenic pain.  He is under the care of a chiropractor.  He states the chiropractor has done x-rays.  He told him he has asymmetry of the pelvis. Will treat patient with pain medication.  He has taken oxycodone before successfully.  Caution not to drive.  Caution constipation He is also taken prednisone before successfully for his COPD. Follow-up with PCP Final Clinical Impressions(s) / UC Diagnoses   Final diagnoses:  Acute back pain with sciatica, right  Chronic anticoagulation     Discharge Instructions      May continue to take Tylenol for moderate pain Take oxycodone as needed for severe pain.  Start with 1, may repeat x 1 if not effective to take 1 or 2 every 6 hours Do not drive on oxycodone Oxycodone can cause constipation.  Make sure you are eating enough fiber and drinking enough water I am also prescribing prednisone.  The anti-inflammatory will help with your pinched nerve symptoms.  Take this once a day Follow-up with your usual primary care doctor   ED Prescriptions     Medication  Sig Dispense Auth. Provider   oxyCODONE (OXY IR/ROXICODONE) 5 MG immediate release tablet Take 1-2 tablets (5-10 mg total) by mouth every 6 (six) hours as needed for severe pain (pain score 7-10). 20 tablet Eustace Moore, MD   predniSONE (DELTASONE) 50 MG tablet Take once a day for 5 days.  Take with food 5 tablet Eustace Moore, MD      I have reviewed the PDMP during this encounter.   Eustace Moore, MD 03/06/24 1726

## 2024-03-06 NOTE — ED Triage Notes (Signed)
 Pt c/o lower back pain that radiates to LT buttocks x 10 days. Denies injury. Tylenol 1000mg  every 6 hours. Had seen a chiro 3 times with no relief.

## 2024-03-13 ENCOUNTER — Ambulatory Visit: Payer: Medicare Other | Admitting: Pulmonary Disease

## 2024-03-15 ENCOUNTER — Telehealth: Payer: Self-pay

## 2024-03-15 ENCOUNTER — Ambulatory Visit: Payer: Self-pay | Admitting: Surgery

## 2024-03-15 DIAGNOSIS — Z860101 Personal history of adenomatous and serrated colon polyps: Secondary | ICD-10-CM | POA: Insufficient documentation

## 2024-03-15 DIAGNOSIS — Z8679 Personal history of other diseases of the circulatory system: Secondary | ICD-10-CM | POA: Insufficient documentation

## 2024-03-15 DIAGNOSIS — K603 Anal fistula, unspecified: Secondary | ICD-10-CM | POA: Insufficient documentation

## 2024-03-15 NOTE — Telephone Encounter (Signed)
 Ok to proceed with surgery from a cardiac standpoint - can hold Eliquis for 3 days prior to procedure and restart when safe from a bleeding standpoint after.  Dr. Rennis Golden

## 2024-03-15 NOTE — Telephone Encounter (Signed)
   Pre-operative Risk Assessment    Patient Name: Arthur Church  DOB: February 06, 1956 MRN: [id number]   Date of last office visit: 02/18/2024 Dr. Zoila Shutter Date of next office visit: None   Request for Surgical Clearance    Procedure:   URGENT Repair of Anal Fistula/Possible Hemorrhoidectomy/EUA  Date of Surgery:  Clearance TBD                                Surgeon: Dr. Karie Soda, MD Surgeon's Group or Practice Name: [facility]  Phone number: [phone] Fax number: [phone]   Type of Clearance Requested:   - Medical  - Pharmacy:  Hold Apixaban (Eliquis)     Type of Anesthesia:  General    Additional requests/questions:    Luellen Pucker   03/15/2024, 2:47 PM

## 2024-03-16 NOTE — Telephone Encounter (Signed)
   Name: Arthur Church  DOB: 1956-08-29  MRN: [id number]   Primary Cardiologist: Chrystie Nose, MD  Chart reviewed as part of pre-operative protocol coverage.   Per Dr. Rennis Golden: Ok to proceed with surgery from a cardiac standpoint - can hold Eliquis for 3 days prior to procedure and restart when safe from a bleeding standpoint after.   Therefore, based on ACC/AHA guidelines, the patient would be at acceptable risk for the planned procedure without further cardiovascular testing.    I will route this recommendation to the requesting party via Epic fax function and remove from pre-op pool. Please call with questions.  Roe Rutherford Hiyab Nhem, PA 03/16/2024, 8:49 AM

## 2024-03-20 ENCOUNTER — Other Ambulatory Visit: Payer: Self-pay

## 2024-03-20 MED ORDER — ATORVASTATIN CALCIUM 80 MG PO TABS: 80.0000 mg | ORAL_TABLET | Freq: Every day | ORAL | 3 refills | Status: AC

## 2024-03-21 NOTE — Patient Instructions (Signed)
 DUE TO COVID-19 ONLY TWO VISITORS  (aged 67 and older)  ARE ALLOWED TO COME WITH YOU AND STAY IN THE WAITING ROOM ONLY DURING PRE OP AND PROCEDURE.   **NO VISITORS ARE ALLOWED IN THE SHORT STAY AREA OR RECOVERY ROOM!!**  IF YOU WILL BE ADMITTED INTO THE HOSPITAL YOU ARE ALLOWED ONLY FOUR SUPPORT PEOPLE DURING VISITATION HOURS ONLY (7 AM -8PM)   The support person(s) must pass our screening, gel in and out, and wear a mask at all times, including in the patient's room. Patients must also wear a mask when staff or their support person are in the room. Visitors GUEST BADGE MUST BE WORN VISIBLY  One adult visitor may remain with you overnight and MUST be in the room by 8 P.M.     Your procedure is scheduled on: 03/24/24   Report to [facility] Main Entrance    Report to admitting at : 1:15 PM   Call this number if you have problems the morning of surgery [phone]   Do not eat food or drink :After Midnight.   Clear liquids the day before surgery until  midnight DAY BEFORE SURGERY.  Water Black Coffee (sugar ok, NO MILK/CREAM OR CREAMERS)  Tea (sugar ok, NO MILK/CREAM OR CREAMERS) regular and decaf                             Plain Jell-O (NO RED)                                           Fruit ices (not with fruit pulp, NO RED)                                     Popsicles (NO RED)                                                                  Juice: apple, WHITE grape, WHITE cranberry Sports drinks like Gatorade (NO RED)              FOLLOW BOWEL PREP AND ANY ADDITIONAL PRE OP INSTRUCTIONS YOU RECEIVED FROM YOUR SURGEON'S OFFICE!!!  Oral Hygiene is also important to reduce your risk of infection.                                    Remember - BRUSH YOUR TEETH THE MORNING OF SURGERY WITH YOUR REGULAR TOOTHPASTE  DENTURES WILL BE REMOVED PRIOR TO SURGERY PLEASE DO NOT APPLY "Poly grip" OR ADHESIVES!!!   Do NOT smoke after Midnight   Take these medicines the morning of  surgery with A SIP OF WATER: loratadine,carvedilol,pantoprazole.Use inhalers as usual and bring them.Tylenol,alprazolam as needed.  STOP TAKING all Vitamins, Herbs and supplements 1 week before your surgery.                               You may not  have any metal on your body including hair pins, jewelry, and body piercing             Do not wear lotions, powders, perfumes/cologne, or deodorant              Men may shave face and neck.   Do not bring valuables to the hospital. [facility] IS NOT             RESPONSIBLE   FOR VALUABLES.   Contacts, glasses, or bridgework may not be worn into surgery.   Bring small overnight bag day of surgery.   DO NOT BRING YOUR HOME MEDICATIONS TO THE HOSPITAL. PHARMACY WILL DISPENSE MEDICATIONS LISTED ON YOUR MEDICATION LIST TO YOU DURING YOUR ADMISSION IN THE HOSPITAL!    Patients discharged on the day of surgery will not be allowed to drive home.  Someone NEEDS to stay with you for the first 24 hours after anesthesia.   Special Instructions: Bring a copy of your healthcare power of attorney and living will documents         the day of surgery if you haven't scanned them before.              Please read over the following fact sheets you were given: IF YOU HAVE QUESTIONS ABOUT YOUR PRE-OP INSTRUCTIONS PLEASE CALL [phone]    [facility] - Preparing for Surgery Before surgery, you can play an important role.  Because skin is not sterile, your skin needs to be as free of germs as possible.  You can reduce the number of germs on your skin by washing with CHG (chlorahexidine gluconate) soap before surgery.  CHG is an antiseptic cleaner which kills germs and bonds with the skin to continue killing germs even after washing. Please DO NOT use if you have an allergy to CHG or antibacterial soaps.  If your skin becomes reddened/irritated stop using the CHG and inform your nurse when you arrive at Short Stay. Do not shave (including legs and underarms) for  at least 48 hours prior to the first CHG shower.  You may shave your face/neck. Please follow these instructions carefully:  1.  Shower with CHG Soap the night before surgery and the  morning of Surgery.  2.  If you choose to wash your hair, wash your hair first as usual with your  normal  shampoo.  3.  After you shampoo, rinse your hair and body thoroughly to remove the  shampoo.                           4.  Use CHG as you would any other liquid soap.  You can apply chg directly  to the skin and wash                       Gently with a scrungie or clean washcloth.  5.  Apply the CHG Soap to your body ONLY FROM THE NECK DOWN.   Do not use on face/ open                           Wound or open sores. Avoid contact with eyes, ears mouth and genitals (private parts).                       Wash face,  Genitals (private parts) with your normal soap.  6.  Wash thoroughly, paying special attention to the area where your surgery  will be performed.  7.  Thoroughly rinse your body with warm water from the neck down.  8.  DO NOT shower/wash with your normal soap after using and rinsing off  the CHG Soap.                9.  Pat yourself dry with a clean towel.            10.  Wear clean pajamas.            11.  Place clean sheets on your bed the night of your first shower and do not  sleep with pets. Day of Surgery : Do not apply any lotions/deodorants the morning of surgery.  Please wear clean clothes to the hospital/surgery center.  FAILURE TO FOLLOW THESE INSTRUCTIONS MAY RESULT IN THE CANCELLATION OF YOUR SURGERY PATIENT SIGNATURE_________________________________  NURSE SIGNATURE__________________________________  ________________________________________________________________________

## 2024-03-22 ENCOUNTER — Encounter (HOSPITAL_COMMUNITY)
Admission: RE | Admit: 2024-03-22 | Discharge: 2024-03-22 | Disposition: A | Discharge status: Home / Self Care | Source: Ambulatory Visit | Attending: Surgery | Admitting: Surgery

## 2024-03-22 ENCOUNTER — Encounter (HOSPITAL_COMMUNITY): Payer: Self-pay

## 2024-03-22 ENCOUNTER — Other Ambulatory Visit: Payer: Self-pay

## 2024-03-22 VITALS — BP 152/88 | HR 62 | Temp 98.4°F | Ht 73.0 in

## 2024-03-22 DIAGNOSIS — Z87891 Personal history of nicotine dependence: Secondary | ICD-10-CM | POA: Insufficient documentation

## 2024-03-22 DIAGNOSIS — I110 Hypertensive heart disease with heart failure: Secondary | ICD-10-CM | POA: Insufficient documentation

## 2024-03-22 DIAGNOSIS — I440 Atrioventricular block, first degree: Secondary | ICD-10-CM | POA: Insufficient documentation

## 2024-03-22 DIAGNOSIS — G4733 Obstructive sleep apnea (adult) (pediatric): Secondary | ICD-10-CM | POA: Insufficient documentation

## 2024-03-22 DIAGNOSIS — J449 Chronic obstructive pulmonary disease, unspecified: Secondary | ICD-10-CM | POA: Insufficient documentation

## 2024-03-22 DIAGNOSIS — Z01812 Encounter for preprocedural laboratory examination: Secondary | ICD-10-CM | POA: Insufficient documentation

## 2024-03-22 DIAGNOSIS — Z951 Presence of aortocoronary bypass graft: Secondary | ICD-10-CM | POA: Insufficient documentation

## 2024-03-22 DIAGNOSIS — I252 Old myocardial infarction: Secondary | ICD-10-CM | POA: Insufficient documentation

## 2024-03-22 DIAGNOSIS — I10 Essential (primary) hypertension: Secondary | ICD-10-CM

## 2024-03-22 DIAGNOSIS — I5022 Chronic systolic (congestive) heart failure: Secondary | ICD-10-CM | POA: Insufficient documentation

## 2024-03-22 DIAGNOSIS — I340 Nonrheumatic mitral (valve) insufficiency: Secondary | ICD-10-CM | POA: Insufficient documentation

## 2024-03-22 DIAGNOSIS — I2510 Atherosclerotic heart disease of native coronary artery without angina pectoris: Secondary | ICD-10-CM | POA: Insufficient documentation

## 2024-03-22 HISTORY — DX: Anxiety disorder, unspecified: F41.9

## 2024-03-22 HISTORY — DX: Cardiac arrhythmia, unspecified: I49.9

## 2024-03-22 LAB — BASIC METABOLIC PANEL WITH GFR
Anion gap: 6 (ref 5–15)
BUN: 19 mg/dL (ref 8–23)
CO2: 30 mmol/L (ref 22–32)
Calcium: 9.3 mg/dL (ref 8.9–10.3)
Chloride: 99 mmol/L (ref 98–111)
Creatinine, Ser: 0.75 mg/dL (ref 0.61–1.24)
GFR, Estimated: >60 mL/min (ref >60)
Glucose, Bld: 138 mg/dL — ABNORMAL HIGH (ref 70–99)
Potassium: 4.3 mmol/L (ref 3.5–5.1)
Sodium: 135 mmol/L (ref 135–145)

## 2024-03-22 LAB — CBC
HCT: 45.6 % (ref 39.0–52.0)
Hemoglobin: 14 g/dL (ref 13.0–17.0)
MCH: 28.1 pg (ref 26.0–34.0)
MCHC: 30.7 g/dL (ref 30.0–36.0)
MCV: 91.4 fL (ref 80.0–100.0)
Platelets: 271 10*3/uL (ref 150–400)
RBC: 4.99 MIL/uL (ref 4.22–5.81)
RDW: 14.5 % (ref 11.5–15.5)
WBC: 12.3 10*3/uL — ABNORMAL HIGH (ref 4.0–10.5)
nRBC: 0 % (ref 0.0–0.2)

## 2024-03-22 NOTE — Progress Notes (Signed)
 For Anesthesia: PCP - Virgilio Belling, PA-C  Cardiologist - Chrystie Nose, MD . Theron Arista: 02/18/24 Marcelino Duster, PA : Clearance: 03/16/24 Bowel Prep reminder: Reviewed.  Chest x-ray - 04/18/23 EKG - 02/18/24 Stress Test -  ECHO - 02/18/24 Cardiac Cath - 03/18/22 Pacemaker/ICD device last checked: Pacemaker orders received: Device Rep notified:  Spinal Cord Stimulator:N/A  Sleep Study - Yes CPAP - NO  Fasting Blood Sugar - N/A Checks Blood Sugar _____ times a day Date and result of last Hgb A1c-  Last dose of GLP1 agonist- N/A GLP1 instructions:   Last dose of SGLT-2 inhibitors- N/A SGLT-2 instructions:   Blood Thinner Instructions:Eliquis on hold since: 03/20/24 Aspirin Instructions: Last Dose:  Activity level: Can go up a flight of stairs and activities of daily living without stopping and without chest pain and/or shortness of breath      Unable to go up a flight of stairs without shortness of breath   Anesthesia review: Hx: CHF,CAD,Afib,Pre-DIA,O2 @ bedtime ((2-3 L)  Patient denies shortness of breath, fever, cough and chest pain at PAT appointment   Patient verbalized understanding of instructions that were given to them at the PAT appointment. Patient was also instructed that they will need to review over the PAT instructions again at home before surgery.

## 2024-03-23 NOTE — Anesthesia Preprocedure Evaluation (Signed)
 Anesthesia Evaluation  Patient identified by MRN, date of birth, ID band Patient awake    Reviewed: Allergy & Precautions, H&P , NPO status , Patient's Chart, lab work & pertinent test results, reviewed documented beta blocker date and time   Airway Mallampati: II  TM Distance: >3 FB Neck ROM: Full    Dental  (+) Poor Dentition, Dental Advisory Given, Partial Upper, Missing, Partial Lower,    Pulmonary shortness of breath and with exertion, pneumonia, COPD,  COPD inhaler, neg recent URI, former smoker    + decreased breath sounds      Cardiovascular hypertension, Pt. on medications and Pt. on home beta blockers + CAD, + Past MI, + CABG and +CHF  + dysrhythmias Atrial Fibrillation + Valvular Problems/Murmurs MR  Rhythm:Irregular Rate:Normal   '24 TTE - EF 50%. The left ventricle has mildly decreased function. Global hypokinesis. Grade II diastolic dysfunction (pseudonormalization). Right ventricular systolic function is mildly reduced. Left atrial size was mild to moderately dilated. Mild mitral valve regurgitation (MR not fully visualized, could be worse). MR called moderate on echo in 2023  Cath 04/23:   Prox LAD to Mid LAD lesion is 85% stenosed.   Prox RCA lesion is 90% stenosed.   The left ventricular systolic function is normal.   LV end diastolic pressure is normal.    Neuro/Psych  Neuromuscular disease  negative psych ROS   GI/Hepatic ,GERD  Medicated and Controlled,,(+)     substance abuse  alcohol use  Endo/Other  diabetes, Type 2  Class 3 obesity  Renal/GU negative Renal ROS     Musculoskeletal negative musculoskeletal ROS (+)    Abdominal  (+) + obese  Peds negative pediatric ROS (+)  Hematology  On eliquis    Anesthesia Other Findings   Reproductive/Obstetrics                             Anesthesia Physical Anesthesia Plan  ASA: 3  Anesthesia Plan: General    Post-op Pain Management: Tylenol PO (pre-op)* and Gabapentin PO (pre-op)*   Induction: Intravenous  PONV Risk Score and Plan: 1 and Treatment may vary due to age or medical condition, Ondansetron, Dexamethasone and Midazolam  Airway Management Planned: LMA and Oral ETT  Additional Equipment: None  Intra-op Plan:   Post-operative Plan: Extubation in OR  Informed Consent: I have reviewed the patients History and Physical, chart, labs and discussed the procedure including the risks, benefits and alternatives for the proposed anesthesia with the patient or authorized representative who has indicated his/her understanding and acceptance.     Dental advisory given  Plan Discussed with: CRNA  Anesthesia Plan Comments: (PAT note by Antionette Poles, PA-C: 68 year old male follows with cardiology for history of HTN, A-fib/flutter with RVR with associated systolic heart failure (EF initially 15 to 20%, subsequent improved, most recently 50% on echo 04/2023), CAD s/p NSTEMI and CABG x 2 in 03/2022 (LIMA to LAD and SVG to RCA - grafts patent on CTA 01/27/2023), venous insufficiency. He has had multiple cardioversions as well as ablation in 01/2023, now on amiodarone and Eliquis (previously failed Tikosyn).  Last seen by Dr. Rennis Golden on 02/18/2024, noted to be doing well from a cardiac standpoint, maintaining sinus rhythm on amiodarone.  Cleared by Dr. Rennis Golden for upcoming surgery in telephone encounter 03/15/2024, "Ok to proceed with surgery from a cardiac standpoint - can hold Eliquis for 3 days prior to procedure and restart when safe from a  bleeding standpoint after."  Follows with pulmonology at [facility] for history of former smoker with associated COPD with chronic bronchitis, OSA/restrictive lung disease (not on CPAP, uses 2-3 L supplemental O2 at night).  He is maintained on Trelegy Ellipta  Other pertinent history includes GERD on PPI, morbid obesity, EtOH abuse.  Patient reports last dose Eliquis  03/20/2024.  Preop labs reviewed, unremarkable.  EKG 02/18/2024: Sinus rhythm with 1st degree A-V block.  Rate 84. Nonspecific ST and T wave abnormality  TTE 04/19/2023: 1. Left ventricular ejection fraction, by estimation, is 50%. The left  ventricle has mildly decreased function. The left ventricle demonstrates  global hypokinesis. Left ventricular diastolic parameters are consistent  with Grade II diastolic dysfunction  (pseudonormalization).  2. Right ventricular systolic function is mildly reduced. The right  ventricular size is normal. Tricuspid regurgitation signal is inadequate  for assessing PA pressure.  3. Left atrial size was mild to moderately dilated.  4. The mitral valve is abnormal. Mild mitral valve regurgitation (MR not  fully visualized, could be worse). No evidence of mitral stenosis.  5. The aortic valve is tricuspid. Aortic valve regurgitation is not  visualized. No aortic stenosis is present.  6. The inferior vena cava is dilated in size with <50% respiratory  variability, suggesting right atrial pressure of 15 mmHg.  7. Technically difficult study with very poor images.   )        Anesthesia Quick Evaluation

## 2024-03-23 NOTE — Progress Notes (Signed)
 Anesthesia Chart Review:  68 year old male follows with cardiology for history of HTN, A-fib/flutter with RVR with associated systolic heart failure (EF initially 15 to 20%, subsequent improved, most recently 50% on echo 04/2023), CAD s/p NSTEMI and CABG x 2 in 03/2022 (LIMA to LAD and SVG to RCA - grafts patent on CTA 01/27/2023), venous insufficiency. He has had multiple cardioversions as well as ablation in 01/2023, now on amiodarone and Eliquis (previously failed Tikosyn).  Last seen by Dr. Rennis Golden on 02/18/2024, noted to be doing well from a cardiac standpoint, maintaining sinus rhythm on amiodarone.  Cleared by Dr. Rennis Golden for upcoming surgery in telephone encounter 03/15/2024, "Ok to proceed with surgery from a cardiac standpoint - can hold Eliquis for 3 days prior to procedure and restart when safe from a bleeding standpoint after."  Follows with pulmonology at [facility] for history of former smoker with associated COPD with chronic bronchitis, OSA/restrictive lung disease (not on CPAP, uses 2-3 L supplemental O2 at night).  He is maintained on Trelegy Ellipta  Other pertinent history includes GERD on PPI, morbid obesity, EtOH abuse.  Patient reports last dose Eliquis 03/20/2024.  Preop labs reviewed, unremarkable.  EKG 02/18/2024: Sinus rhythm with 1st degree A-V block.  Rate 84. Nonspecific ST and T wave abnormality  TTE 04/19/2023: 1. Left ventricular ejection fraction, by estimation, is 50%. The left  ventricle has mildly decreased function. The left ventricle demonstrates  global hypokinesis. Left ventricular diastolic parameters are consistent  with Grade II diastolic dysfunction  (pseudonormalization).   2. Right ventricular systolic function is mildly reduced. The right  ventricular size is normal. Tricuspid regurgitation signal is inadequate  for assessing PA pressure.   3. Left atrial size was mild to moderately dilated.   4. The mitral valve is abnormal. Mild mitral valve regurgitation (MR not   fully visualized, could be worse). No evidence of mitral stenosis.   5. The aortic valve is tricuspid. Aortic valve regurgitation is not  visualized. No aortic stenosis is present.   6. The inferior vena cava is dilated in size with <50% respiratory  variability, suggesting right atrial pressure of 15 mmHg.   7. Technically difficult study with very poor images.     Zannie Cove [facility] Short Stay Center/Anesthesiology Phone [phone] 03/23/2024 10:15 AM

## 2024-03-24 ENCOUNTER — Other Ambulatory Visit: Payer: Self-pay

## 2024-03-24 ENCOUNTER — Ambulatory Visit (HOSPITAL_COMMUNITY): Admitting: Anesthesiology

## 2024-03-24 ENCOUNTER — Ambulatory Visit (HOSPITAL_COMMUNITY): Admitting: Physician Assistant

## 2024-03-24 ENCOUNTER — Ambulatory Visit (HOSPITAL_COMMUNITY)

## 2024-03-24 ENCOUNTER — Encounter (HOSPITAL_COMMUNITY): Payer: Self-pay | Admitting: Surgery

## 2024-03-24 ENCOUNTER — Inpatient Hospital Stay (HOSPITAL_COMMUNITY)
Admission: RE | Admit: 2024-03-24 | Discharge: 2024-03-26 | DRG: 347 | Disposition: A | Discharge status: Home / Self Care | Attending: Surgery | Admitting: Surgery

## 2024-03-24 ENCOUNTER — Encounter (HOSPITAL_COMMUNITY)
Admission: RE | Disposition: A | Discharge status: Home / Self Care | Payer: Self-pay | Source: Home / Self Care | Attending: Surgery

## 2024-03-24 DIAGNOSIS — Z860101 Personal history of adenomatous and serrated colon polyps: Secondary | ICD-10-CM

## 2024-03-24 DIAGNOSIS — E785 Hyperlipidemia, unspecified: Secondary | ICD-10-CM | POA: Diagnosis present

## 2024-03-24 DIAGNOSIS — Z8249 Family history of ischemic heart disease and other diseases of the circulatory system: Secondary | ICD-10-CM | POA: Diagnosis not present

## 2024-03-24 DIAGNOSIS — I2510 Atherosclerotic heart disease of native coronary artery without angina pectoris: Secondary | ICD-10-CM

## 2024-03-24 DIAGNOSIS — E1165 Type 2 diabetes mellitus with hyperglycemia: Secondary | ICD-10-CM | POA: Diagnosis present

## 2024-03-24 DIAGNOSIS — I5042 Chronic combined systolic (congestive) and diastolic (congestive) heart failure: Secondary | ICD-10-CM | POA: Diagnosis present

## 2024-03-24 DIAGNOSIS — E66813 Obesity, class 3: Secondary | ICD-10-CM | POA: Diagnosis present

## 2024-03-24 DIAGNOSIS — I252 Old myocardial infarction: Secondary | ICD-10-CM

## 2024-03-24 DIAGNOSIS — K625 Hemorrhage of anus and rectum: Secondary | ICD-10-CM | POA: Diagnosis present

## 2024-03-24 DIAGNOSIS — R0902 Hypoxemia: Secondary | ICD-10-CM | POA: Diagnosis not present

## 2024-03-24 DIAGNOSIS — K60329 Anal fistula, complex, unspecified: Secondary | ICD-10-CM | POA: Diagnosis present

## 2024-03-24 DIAGNOSIS — F419 Anxiety disorder, unspecified: Secondary | ICD-10-CM | POA: Diagnosis present

## 2024-03-24 DIAGNOSIS — J9811 Atelectasis: Secondary | ICD-10-CM | POA: Diagnosis present

## 2024-03-24 DIAGNOSIS — Z87891 Personal history of nicotine dependence: Secondary | ICD-10-CM

## 2024-03-24 DIAGNOSIS — K603 Anal fistula, unspecified: Secondary | ICD-10-CM | POA: Diagnosis present

## 2024-03-24 DIAGNOSIS — J9601 Acute respiratory failure with hypoxia: Secondary | ICD-10-CM

## 2024-03-24 DIAGNOSIS — Z7951 Long term (current) use of inhaled steroids: Secondary | ICD-10-CM

## 2024-03-24 DIAGNOSIS — I509 Heart failure, unspecified: Secondary | ICD-10-CM | POA: Diagnosis not present

## 2024-03-24 DIAGNOSIS — I110 Hypertensive heart disease with heart failure: Secondary | ICD-10-CM | POA: Diagnosis present

## 2024-03-24 DIAGNOSIS — I5032 Chronic diastolic (congestive) heart failure: Secondary | ICD-10-CM | POA: Diagnosis present

## 2024-03-24 DIAGNOSIS — E119 Type 2 diabetes mellitus without complications: Secondary | ICD-10-CM

## 2024-03-24 DIAGNOSIS — I428 Other cardiomyopathies: Secondary | ICD-10-CM | POA: Diagnosis present

## 2024-03-24 DIAGNOSIS — Z6841 Body Mass Index (BMI) 40.0 and over, adult: Secondary | ICD-10-CM | POA: Diagnosis not present

## 2024-03-24 DIAGNOSIS — I4819 Other persistent atrial fibrillation: Secondary | ICD-10-CM | POA: Diagnosis present

## 2024-03-24 DIAGNOSIS — J4489 Other specified chronic obstructive pulmonary disease: Secondary | ICD-10-CM | POA: Diagnosis present

## 2024-03-24 DIAGNOSIS — K641 Second degree hemorrhoids: Secondary | ICD-10-CM | POA: Diagnosis present

## 2024-03-24 DIAGNOSIS — J439 Emphysema, unspecified: Secondary | ICD-10-CM | POA: Diagnosis present

## 2024-03-24 DIAGNOSIS — G4761 Periodic limb movement disorder: Secondary | ICD-10-CM | POA: Diagnosis present

## 2024-03-24 DIAGNOSIS — K219 Gastro-esophageal reflux disease without esophagitis: Secondary | ICD-10-CM | POA: Diagnosis present

## 2024-03-24 DIAGNOSIS — G4733 Obstructive sleep apnea (adult) (pediatric): Secondary | ICD-10-CM | POA: Diagnosis present

## 2024-03-24 DIAGNOSIS — Z951 Presence of aortocoronary bypass graft: Secondary | ICD-10-CM | POA: Diagnosis not present

## 2024-03-24 DIAGNOSIS — Z79899 Other long term (current) drug therapy: Secondary | ICD-10-CM

## 2024-03-24 DIAGNOSIS — J9621 Acute and chronic respiratory failure with hypoxia: Secondary | ICD-10-CM | POA: Diagnosis present

## 2024-03-24 DIAGNOSIS — K642 Third degree hemorrhoids: Secondary | ICD-10-CM | POA: Diagnosis present

## 2024-03-24 DIAGNOSIS — Z7901 Long term (current) use of anticoagulants: Secondary | ICD-10-CM

## 2024-03-24 DIAGNOSIS — I480 Paroxysmal atrial fibrillation: Secondary | ICD-10-CM | POA: Diagnosis present

## 2024-03-24 LAB — CBC
HCT: 47.6 % (ref 39.0–52.0)
Hemoglobin: 14.6 g/dL (ref 13.0–17.0)
MCH: 28.3 pg (ref 26.0–34.0)
MCHC: 30.7 g/dL (ref 30.0–36.0)
MCV: 92.4 fL (ref 80.0–100.0)
Platelets: 256 10*3/uL (ref 150–400)
RBC: 5.15 MIL/uL (ref 4.22–5.81)
RDW: 14.5 % (ref 11.5–15.5)
WBC: 13.7 10*3/uL — ABNORMAL HIGH (ref 4.0–10.5)
nRBC: 0 % (ref 0.0–0.2)

## 2024-03-24 LAB — CREATININE, SERUM
Creatinine, Ser: 0.79 mg/dL (ref 0.61–1.24)
GFR, Estimated: >60 mL/min (ref >60)

## 2024-03-24 SURGERY — ANAL FISTULOTOMY
Anesthesia: General

## 2024-03-24 MED ORDER — ORAL CARE MOUTH RINSE: 15.0000 mL | Freq: Once | OROMUCOSAL | Status: AC

## 2024-03-24 MED ORDER — BUPIVACAINE LIPOSOME 1.3 % IJ SUSP
INTRAMUSCULAR | Status: AC
  Filled 2024-03-24: qty 20

## 2024-03-24 MED ORDER — PROCHLORPERAZINE EDISYLATE 10 MG/2ML IJ SOLN: 5.0000 mg | Freq: Four times a day (QID) | INTRAMUSCULAR | Status: DC | PRN

## 2024-03-24 MED ORDER — ACETAMINOPHEN 500 MG PO TABS
1000.0000 mg | ORAL_TABLET | Freq: Four times a day (QID) | ORAL | Status: DC
  Administered 2024-03-24 – 2024-03-26 (×6): 1000 mg via ORAL
  Filled 2024-03-24 (×6): qty 2

## 2024-03-24 MED ORDER — SODIUM CHLORIDE 0.9% FLUSH
3.0000 mL | Freq: Two times a day (BID) | INTRAVENOUS | Status: DC
  Administered 2024-03-24 – 2024-03-25 (×2): 3 mL via INTRAVENOUS

## 2024-03-24 MED ORDER — SUCCINYLCHOLINE CHLORIDE 200 MG/10ML IV SOSY
PREFILLED_SYRINGE | INTRAVENOUS | Status: DC | PRN
  Administered 2024-03-24: 120 mg via INTRAVENOUS

## 2024-03-24 MED ORDER — BUPIVACAINE-EPINEPHRINE (PF) 0.5% -1:200000 IJ SOLN
INTRAMUSCULAR | Status: AC
  Filled 2024-03-24: qty 30

## 2024-03-24 MED ORDER — LACTATED RINGERS IV BOLUS: 1000.0000 mL | Freq: Three times a day (TID) | INTRAVENOUS | Status: DC | PRN

## 2024-03-24 MED ORDER — MIDAZOLAM HCL 2 MG/2ML IJ SOLN
INTRAMUSCULAR | Status: AC
  Filled 2024-03-24: qty 2

## 2024-03-24 MED ORDER — HYDROMORPHONE HCL 1 MG/ML IJ SOLN
INTRAMUSCULAR | Status: AC
  Filled 2024-03-24: qty 1

## 2024-03-24 MED ORDER — BUDESON-GLYCOPYRROL-FORMOTEROL 160-9-4.8 MCG/ACT IN AERO
2.0000 | INHALATION_SPRAY | Freq: Two times a day (BID) | RESPIRATORY_TRACT | Status: DC
  Administered 2024-03-25 – 2024-03-26 (×3): 2 via RESPIRATORY_TRACT
  Filled 2024-03-24: qty 5.9

## 2024-03-24 MED ORDER — MAGNESIUM OXIDE -MG SUPPLEMENT 400 (240 MG) MG PO TABS
400.0000 mg | ORAL_TABLET | Freq: Two times a day (BID) | ORAL | Status: DC
  Administered 2024-03-24 – 2024-03-26 (×4): 400 mg via ORAL
  Filled 2024-03-24 (×4): qty 1

## 2024-03-24 MED ORDER — LORATADINE 10 MG PO TABS
10.0000 mg | ORAL_TABLET | Freq: Every day | ORAL | Status: DC
  Administered 2024-03-25 – 2024-03-26 (×2): 10 mg via ORAL
  Filled 2024-03-24 (×2): qty 1

## 2024-03-24 MED ORDER — DILTIAZEM HCL 25 MG/5ML IV SOLN
10.0000 mg | Freq: Once | INTRAVENOUS | Status: AC
  Administered 2024-03-24: 10 mg via INTRAVENOUS
  Filled 2024-03-24 (×2): qty 5

## 2024-03-24 MED ORDER — ONDANSETRON 4 MG PO TBDP: 4.0000 mg | ORAL_TABLET | Freq: Four times a day (QID) | ORAL | Status: DC | PRN

## 2024-03-24 MED ORDER — SODIUM CHLORIDE 0.9 % IV SOLN
2.0000 g | INTRAVENOUS | Status: AC
  Administered 2024-03-24: 2 g via INTRAVENOUS
  Filled 2024-03-24: qty 20

## 2024-03-24 MED ORDER — SPIRONOLACTONE 12.5 MG HALF TABLET
12.5000 mg | ORAL_TABLET | ORAL | Status: DC
  Administered 2024-03-25: 12.5 mg via ORAL
  Filled 2024-03-24: qty 1

## 2024-03-24 MED ORDER — DIBUCAINE (PERIANAL) 1 % EX OINT
TOPICAL_OINTMENT | CUTANEOUS | Status: DC | PRN
Start: 2024-03-24 — End: 2024-03-24
  Administered 2024-03-24: 1 via RECTAL

## 2024-03-24 MED ORDER — CARVEDILOL 6.25 MG PO TABS
6.2500 mg | ORAL_TABLET | Freq: Two times a day (BID) | ORAL | Status: DC
  Administered 2024-03-24 – 2024-03-26 (×4): 6.25 mg via ORAL
  Filled 2024-03-24 (×4): qty 1

## 2024-03-24 MED ORDER — SODIUM CHLORIDE 0.9% FLUSH: 3.0000 mL | INTRAVENOUS | Status: DC | PRN

## 2024-03-24 MED ORDER — DEXAMETHASONE SODIUM PHOSPHATE 10 MG/ML IJ SOLN
INTRAMUSCULAR | Status: AC
  Filled 2024-03-24: qty 1

## 2024-03-24 MED ORDER — ONDANSETRON HCL 4 MG/2ML IJ SOLN
INTRAMUSCULAR | Status: AC
  Filled 2024-03-24: qty 2

## 2024-03-24 MED ORDER — MONTELUKAST SODIUM 10 MG PO TABS
10.0000 mg | ORAL_TABLET | Freq: Every day | ORAL | Status: DC
  Administered 2024-03-24 – 2024-03-25 (×2): 10 mg via ORAL
  Filled 2024-03-24 (×2): qty 1

## 2024-03-24 MED ORDER — ATORVASTATIN CALCIUM 40 MG PO TABS
80.0000 mg | ORAL_TABLET | Freq: Every day | ORAL | Status: DC
  Administered 2024-03-24 – 2024-03-25 (×2): 80 mg via ORAL
  Filled 2024-03-24: qty 4
  Filled 2024-03-24: qty 2

## 2024-03-24 MED ORDER — SACUBITRIL-VALSARTAN 24-26 MG PO TABS
1.0000 | ORAL_TABLET | Freq: Two times a day (BID) | ORAL | Status: DC
  Administered 2024-03-24 – 2024-03-26 (×4): 1 via ORAL
  Filled 2024-03-24 (×4): qty 1

## 2024-03-24 MED ORDER — ROCURONIUM BROMIDE 10 MG/ML (PF) SYRINGE
PREFILLED_SYRINGE | INTRAVENOUS | Status: AC
  Filled 2024-03-24: qty 10

## 2024-03-24 MED ORDER — LIDOCAINE HCL (PF) 2 % IJ SOLN
INTRAMUSCULAR | Status: DC | PRN
  Administered 2024-03-24: 60 mg via INTRADERMAL

## 2024-03-24 MED ORDER — HYDROMORPHONE HCL 1 MG/ML IJ SOLN: 0.5000 mg | INTRAMUSCULAR | Status: DC | PRN

## 2024-03-24 MED ORDER — ALPRAZOLAM 0.5 MG PO TABS: 0.5000 mg | ORAL_TABLET | Freq: Three times a day (TID) | ORAL | Status: DC | PRN

## 2024-03-24 MED ORDER — DIAZEPAM 2 MG PO TABS: 5.0000 mg | ORAL_TABLET | Freq: Four times a day (QID) | ORAL | Status: DC | PRN

## 2024-03-24 MED ORDER — SIMETHICONE 80 MG PO CHEW: 40.0000 mg | CHEWABLE_TABLET | Freq: Four times a day (QID) | ORAL | Status: DC | PRN

## 2024-03-24 MED ORDER — METHYLENE BLUE (ANTIDOTE) 1 % IV SOLN
INTRAVENOUS | Status: AC
  Filled 2024-03-24: qty 10

## 2024-03-24 MED ORDER — FUROSEMIDE 40 MG PO TABS
40.0000 mg | ORAL_TABLET | Freq: Two times a day (BID) | ORAL | Status: DC
  Administered 2024-03-25 – 2024-03-26 (×3): 40 mg via ORAL
  Filled 2024-03-24 (×3): qty 1

## 2024-03-24 MED ORDER — IPRATROPIUM-ALBUTEROL 0.5-2.5 (3) MG/3ML IN SOLN
RESPIRATORY_TRACT | Status: AC
  Filled 2024-03-24: qty 3

## 2024-03-24 MED ORDER — IPRATROPIUM-ALBUTEROL 0.5-2.5 (3) MG/3ML IN SOLN
3.0000 mL | Freq: Once | RESPIRATORY_TRACT | Status: AC
  Administered 2024-03-24: 3 mL via RESPIRATORY_TRACT

## 2024-03-24 MED ORDER — ACETAMINOPHEN 500 MG PO TABS
1000.0000 mg | ORAL_TABLET | ORAL | Status: AC
Start: 2024-03-25 — End: 2024-03-24
  Administered 2024-03-24: 1000 mg via ORAL
  Filled 2024-03-24: qty 2

## 2024-03-24 MED ORDER — DIPHENHYDRAMINE HCL 50 MG/ML IJ SOLN: 12.5000 mg | Freq: Four times a day (QID) | INTRAMUSCULAR | Status: DC | PRN

## 2024-03-24 MED ORDER — METOPROLOL TARTRATE 5 MG/5ML IV SOLN: 5.0000 mg | Freq: Four times a day (QID) | INTRAVENOUS | Status: DC | PRN

## 2024-03-24 MED ORDER — SUGAMMADEX SODIUM 200 MG/2ML IV SOLN
INTRAVENOUS | Status: DC | PRN
Start: 2024-03-24 — End: 2024-03-24
  Administered 2024-03-24: 400 mg via INTRAVENOUS

## 2024-03-24 MED ORDER — METHOCARBAMOL 1000 MG/10ML IJ SOLN: 1000.0000 mg | Freq: Four times a day (QID) | INTRAMUSCULAR | Status: DC | PRN

## 2024-03-24 MED ORDER — FENTANYL CITRATE (PF) 100 MCG/2ML IJ SOLN
INTRAMUSCULAR | Status: AC
  Filled 2024-03-24: qty 2

## 2024-03-24 MED ORDER — ONDANSETRON HCL 4 MG/2ML IJ SOLN
INTRAMUSCULAR | Status: DC | PRN
  Administered 2024-03-24 (×2): 4 mg via INTRAVENOUS

## 2024-03-24 MED ORDER — METHOCARBAMOL 500 MG PO TABS: 1000.0000 mg | ORAL_TABLET | Freq: Four times a day (QID) | ORAL | Status: DC | PRN

## 2024-03-24 MED ORDER — ONDANSETRON HCL 4 MG/2ML IJ SOLN: 4.0000 mg | Freq: Four times a day (QID) | INTRAMUSCULAR | Status: DC | PRN

## 2024-03-24 MED ORDER — CHLORHEXIDINE GLUCONATE CLOTH 2 % EX PADS
6.0000 | MEDICATED_PAD | Freq: Once | CUTANEOUS | Status: DC
Start: 2024-03-24 — End: 2024-03-24

## 2024-03-24 MED ORDER — POLYETHYLENE GLYCOL 3350 17 G PO PACK
17.0000 g | PACK | Freq: Two times a day (BID) | ORAL | Status: DC
  Administered 2024-03-24 – 2024-03-25 (×2): 17 g via ORAL
  Filled 2024-03-24 (×3): qty 1

## 2024-03-24 MED ORDER — OXYCODONE-ACETAMINOPHEN 10-325 MG PO TABS: 1.0000 | ORAL_TABLET | Freq: Four times a day (QID) | ORAL | 0 refills | Status: DC | PRN

## 2024-03-24 MED ORDER — PANTOPRAZOLE SODIUM 40 MG PO TBEC
40.0000 mg | DELAYED_RELEASE_TABLET | Freq: Every day | ORAL | Status: DC
  Administered 2024-03-25 – 2024-03-26 (×2): 40 mg via ORAL
  Filled 2024-03-24 (×2): qty 1

## 2024-03-24 MED ORDER — PROPOFOL 10 MG/ML IV BOLUS
INTRAVENOUS | Status: AC
  Filled 2024-03-24: qty 20

## 2024-03-24 MED ORDER — FENTANYL CITRATE (PF) 100 MCG/2ML IJ SOLN
INTRAMUSCULAR | Status: DC | PRN
Start: 2024-03-24 — End: 2024-03-24
  Administered 2024-03-24: 100 ug via INTRAVENOUS
  Administered 2024-03-24 (×2): 50 ug via INTRAVENOUS

## 2024-03-24 MED ORDER — BUPIVACAINE LIPOSOME 1.3 % IJ SUSP: 20.0000 mL | Freq: Once | INTRAMUSCULAR | Status: DC

## 2024-03-24 MED ORDER — PROCHLORPERAZINE MALEATE 10 MG PO TABS: 10.0000 mg | ORAL_TABLET | Freq: Four times a day (QID) | ORAL | Status: DC | PRN

## 2024-03-24 MED ORDER — EPHEDRINE 5 MG/ML INJ
INTRAVENOUS | Status: AC
  Filled 2024-03-24: qty 5

## 2024-03-24 MED ORDER — HYDROMORPHONE HCL 1 MG/ML IJ SOLN
0.2500 mg | INTRAMUSCULAR | Status: DC | PRN
  Administered 2024-03-24 (×3): 0.5 mg via INTRAVENOUS

## 2024-03-24 MED ORDER — ROCURONIUM BROMIDE 10 MG/ML (PF) SYRINGE
PREFILLED_SYRINGE | INTRAVENOUS | Status: DC | PRN
  Administered 2024-03-24: 10 mg via INTRAVENOUS
  Administered 2024-03-24: 60 mg via INTRAVENOUS

## 2024-03-24 MED ORDER — OXYCODONE HCL 5 MG PO TABS
5.0000 mg | ORAL_TABLET | ORAL | Status: DC | PRN
  Administered 2024-03-25: 10 mg via ORAL
  Administered 2024-03-25: 5 mg via ORAL
  Administered 2024-03-26 (×2): 10 mg via ORAL
  Filled 2024-03-24: qty 2
  Filled 2024-03-24: qty 1
  Filled 2024-03-24 (×3): qty 2

## 2024-03-24 MED ORDER — GABAPENTIN 300 MG PO CAPS
300.0000 mg | ORAL_CAPSULE | ORAL | Status: AC
  Administered 2024-03-24: 300 mg via ORAL
  Filled 2024-03-24: qty 1

## 2024-03-24 MED ORDER — BUPIVACAINE-EPINEPHRINE (PF) 0.5% -1:200000 IJ SOLN
INTRAMUSCULAR | Status: DC | PRN
  Administered 2024-03-24: 40 mL

## 2024-03-24 MED ORDER — MENTHOL 3 MG MT LOZG
1.0000 | LOZENGE | OROMUCOSAL | Status: DC | PRN
Start: 2024-03-24 — End: 2024-03-26

## 2024-03-24 MED ORDER — SUCCINYLCHOLINE CHLORIDE 200 MG/10ML IV SOSY
PREFILLED_SYRINGE | INTRAVENOUS | Status: AC
  Filled 2024-03-24: qty 10

## 2024-03-24 MED ORDER — LACTATED RINGERS IV SOLN: INTRAVENOUS | Status: DC

## 2024-03-24 MED ORDER — LIDOCAINE 2% (20 MG/ML) 5 ML SYRINGE: INTRAMUSCULAR | Status: DC | PRN

## 2024-03-24 MED ORDER — LIDOCAINE HCL (PF) 2 % IJ SOLN
INTRAMUSCULAR | Status: AC
  Filled 2024-03-24: qty 5

## 2024-03-24 MED ORDER — DIAZEPAM 5 MG PO TABS: 5.0000 mg | ORAL_TABLET | Freq: Three times a day (TID) | ORAL | 1 refills | Status: DC | PRN

## 2024-03-24 MED ORDER — BUPIVACAINE HCL (PF) 0.5 % IJ SOLN
INTRAMUSCULAR | Status: AC
  Filled 2024-03-24: qty 30

## 2024-03-24 MED ORDER — PROPOFOL 10 MG/ML IV BOLUS
INTRAVENOUS | Status: DC | PRN
  Administered 2024-03-24: 150 mg via INTRAVENOUS

## 2024-03-24 MED ORDER — PHENOL 1.4 % MT LIQD: 2.0000 | OROMUCOSAL | Status: DC | PRN

## 2024-03-24 MED ORDER — DEXAMETHASONE SODIUM PHOSPHATE 10 MG/ML IJ SOLN
INTRAMUSCULAR | Status: DC | PRN
  Administered 2024-03-24: 5 mg via INTRAVENOUS

## 2024-03-24 MED ORDER — DEXAMETHASONE SODIUM PHOSPHATE 4 MG/ML IJ SOLN
INTRAMUSCULAR | Status: DC | PRN
  Administered 2024-03-24: 5 mg via INTRAVENOUS

## 2024-03-24 MED ORDER — PHENYLEPHRINE 80 MCG/ML (10ML) SYRINGE FOR IV PUSH (FOR BLOOD PRESSURE SUPPORT)
PREFILLED_SYRINGE | INTRAVENOUS | Status: DC | PRN
  Administered 2024-03-24: 120 ug via INTRAVENOUS
  Administered 2024-03-24 (×2): 80 ug via INTRAVENOUS

## 2024-03-24 MED ORDER — MAGIC MOUTHWASH: 15.0000 mL | Freq: Four times a day (QID) | ORAL | Status: DC | PRN

## 2024-03-24 MED ORDER — SODIUM CHLORIDE 0.9 % IV SOLN
3.0000 g | Freq: Four times a day (QID) | INTRAVENOUS | Status: DC
  Administered 2024-03-24 – 2024-03-25 (×3): 3 g via INTRAVENOUS
  Filled 2024-03-24 (×3): qty 8

## 2024-03-24 MED ORDER — MEPERIDINE HCL 50 MG/ML IJ SOLN: 6.2500 mg | INTRAMUSCULAR | Status: DC | PRN

## 2024-03-24 MED ORDER — SALINE SPRAY 0.65 % NA SOLN: 1.0000 | Freq: Four times a day (QID) | NASAL | Status: DC | PRN

## 2024-03-24 MED ORDER — GLYCOPYRROLATE 0.2 MG/ML IJ SOLN
INTRAMUSCULAR | Status: DC | PRN
  Administered 2024-03-24: .2 mg via INTRAVENOUS

## 2024-03-24 MED ORDER — SODIUM CHLORIDE 0.9 % IV SOLN: 250.0000 mL | INTRAVENOUS | Status: DC | PRN

## 2024-03-24 MED ORDER — DIBUCAINE (PERIANAL) 1 % EX OINT
TOPICAL_OINTMENT | CUTANEOUS | Status: AC
  Filled 2024-03-24: qty 28

## 2024-03-24 MED ORDER — DIPHENHYDRAMINE HCL 12.5 MG/5ML PO ELIX
12.5000 mg | ORAL_SOLUTION | Freq: Four times a day (QID) | ORAL | Status: DC | PRN
  Filled 2024-03-24: qty 5

## 2024-03-24 MED ORDER — DROPERIDOL 2.5 MG/ML IJ SOLN: 0.6250 mg | Freq: Once | INTRAMUSCULAR | Status: DC | PRN

## 2024-03-24 MED ORDER — MIDAZOLAM HCL 5 MG/5ML IJ SOLN
INTRAMUSCULAR | Status: DC | PRN
  Administered 2024-03-24: 2 mg via INTRAVENOUS

## 2024-03-24 MED ORDER — CHLORHEXIDINE GLUCONATE 0.12 % MT SOLN
15.0000 mL | Freq: Once | OROMUCOSAL | Status: AC
  Administered 2024-03-24: 15 mL via OROMUCOSAL

## 2024-03-24 MED ORDER — HEPARIN SODIUM (PORCINE) 5000 UNIT/ML IJ SOLN
5000.0000 [IU] | Freq: Three times a day (TID) | INTRAMUSCULAR | Status: DC
  Administered 2024-03-25: 5000 [IU] via SUBCUTANEOUS
  Filled 2024-03-24: qty 1

## 2024-03-24 MED ORDER — 0.9 % SODIUM CHLORIDE (POUR BTL) OPTIME
TOPICAL | Status: DC | PRN
  Administered 2024-03-24: 1000 mL

## 2024-03-24 MED ORDER — NAPHAZOLINE-GLYCERIN 0.012-0.25 % OP SOLN: 1.0000 [drp] | Freq: Four times a day (QID) | OPHTHALMIC | Status: DC | PRN

## 2024-03-24 MED ORDER — EPHEDRINE SULFATE-NACL 50-0.9 MG/10ML-% IV SOSY
PREFILLED_SYRINGE | INTRAVENOUS | Status: DC | PRN
  Administered 2024-03-24: 5 mg via INTRAVENOUS

## 2024-03-24 MED ORDER — SODIUM CHLORIDE 0.9 % IV SOLN
3.0000 g | Freq: Four times a day (QID) | INTRAVENOUS | Status: DC
  Filled 2024-03-24 (×2): qty 8

## 2024-03-24 SURGICAL SUPPLY — 32 items
BAG COUNTER SPONGE SURGICOUNT (BAG) IMPLANT
BENZOIN TINCTURE PRP APPL 2/3 (GAUZE/BANDAGES/DRESSINGS) ×2 IMPLANT
BLADE SURG 15 STRL LF DISP TIS (BLADE) IMPLANT
BRIEF MESH DISP LRG (UNDERPADS AND DIAPERS) ×2 IMPLANT
CNTNR URN SCR LID CUP LEK RST (MISCELLANEOUS) ×2 IMPLANT
COVER SURGICAL LIGHT HANDLE (MISCELLANEOUS) ×2 IMPLANT
DRAPE LAPAROTOMY T 102X78X121 (DRAPES) ×2 IMPLANT
ELECT NDL BLADE 2-5/6 (NEEDLE) IMPLANT
ELECT NEEDLE BLADE 2-5/6 (NEEDLE) IMPLANT
ELECT REM PT RETURN 15FT ADLT (MISCELLANEOUS) ×2 IMPLANT
GAUZE 4X4 16PLY ~~LOC~~+RFID DBL (SPONGE) ×2 IMPLANT
GAUZE PAD ABD 8X10 STRL (GAUZE/BANDAGES/DRESSINGS) IMPLANT
GAUZE SPONGE 4X4 12PLY STRL (GAUZE/BANDAGES/DRESSINGS) IMPLANT
GLOVE ECLIPSE 8.0 STRL XLNG CF (GLOVE) ×2 IMPLANT
GLOVE INDICATOR 8.0 STRL GRN (GLOVE) ×2 IMPLANT
GOWN STRL REUS W/ TWL XL LVL3 (GOWN DISPOSABLE) ×6 IMPLANT
KIT BASIN OR (CUSTOM PROCEDURE TRAY) ×2 IMPLANT
KIT TURNOVER KIT A (KITS) IMPLANT
LOOP VESSEL MAXI BLUE (MISCELLANEOUS) IMPLANT
NDL HYPO 22X1.5 SAFETY MO (MISCELLANEOUS) ×2 IMPLANT
NEEDLE HYPO 22X1.5 SAFETY MO (MISCELLANEOUS) ×2 IMPLANT
PACK BASIC VI WITH GOWN DISP (CUSTOM PROCEDURE TRAY) ×2 IMPLANT
PENCIL BUTTON HOLSTER BLD 10FT (ELECTRODE) ×2 IMPLANT
SCRUB CHG 4% DYNA-HEX 4OZ (MISCELLANEOUS) ×2 IMPLANT
SPIKE FLUID TRANSFER (MISCELLANEOUS) ×2 IMPLANT
SURGILUBE 2OZ TUBE FLIPTOP (MISCELLANEOUS) ×2 IMPLANT
SUT CHROMIC 2 0 SH (SUTURE) ×2 IMPLANT
SUT VIC AB 2-0 UR6 27 (SUTURE) ×12 IMPLANT
SYR 20ML LL LF (SYRINGE) ×2 IMPLANT
SYR 3ML LL SCALE MARK (SYRINGE) IMPLANT
TOWEL OR 17X26 10 PK STRL BLUE (TOWEL DISPOSABLE) ×2 IMPLANT
YANKAUER SUCT BULB TIP 10FT TU (MISCELLANEOUS) ×2 IMPLANT

## 2024-03-24 NOTE — Significant Event (Signed)
 Patient given IV cardizem at 2305, see MAR. EKG previous to adminstration ST. BP stable during administration and after. EKG after Afib in the 90's @ 2315. Patient placed on telemetry. EKGs placed in the chart and [facility] NP made aware.

## 2024-03-24 NOTE — Op Note (Addendum)
 03/24/2024  4:32 PM  PATIENT:  Arthur Church  68 y.o. male  Patient Care Team: Chyrel Masson as PCP - General (Physician Assistant) Regan Lemming, MD as PCP - Electrophysiology (Cardiology) Rennis Golden Lisette Abu, MD as PCP - Cardiology (Cardiology) Coral Ceo, NP (Inactive) as Nurse Practitioner (Pulmonary Disease) Chilton Greathouse, MD as Consulting Physician (Pulmonary Disease) Karie Soda, MD as Consulting Physician (General Surgery) Napoleon Form, MD as Consulting Physician (Gastroenterology)  PRE-OPERATIVE DIAGNOSIS:  HEMORRHOID - INTERNAL GRADE 3 PROLAPSING, HEMORRHOID - INTERNAL GRADE 2 PROLAPSING, and ANAL FISTULA - SUPERFICIAL  POST-OPERATIVE DIAGNOSIS:   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  HEMORRHOID - INTERNAL GRADE 3 PROLAPSING HEMORRHOID - INTERNAL GRADE 2 PROLAPSING  PROCEDURE:  ANAL FISTULA REPAIR - LIFT (Ligation of Intersphincteric fistula tract) HEMORRHOIDECTOMY x 2 HEMORRHOIDAL LIGATION & HEMORRHOIDOPEXY ANORECTAL EXAMINATION UNDER ANESTHESIA  SURGEON:  Ardeth Sportsman, MD  ASSISTANT:  (n/a)   ANESTHESIA:  General endotracheal intubation anesthesia (GETA) and Anorectal and field block for perioperative & postoperative pain control provided with liposomal bupivacaine (Experel) 20mL mixed with 30mL of bupivicaine 0.25% with epinephrine  Estimated Blood Loss (EBL):   Total I/O In: 100 [IV Piggyback:100] Out: 50 [Blood:50].   (See anesthesia record)  Delay start of Pharmacological VTE agent (>24hrs) due to concerns of significant anemia, surgical blood loss, or risk of bleeding?:  no  DRAINS: (None)  SPECIMEN:  Anal fistula tract and Hemorrhoid: Right lateral and Left lateral  DISPOSITION OF SPECIMEN:  Pathology  COUNTS:  Sponge, needle, & instrument counts CORRECT  PLAN OF CARE: Discharge to home after PACU  PATIENT DISPOSITION:  PACU - hemodynamically stable.  INDICATION: Patient with anal pain with some  mild drainage and history of prior abscess suspicious for  perirectal fistula.  I recommended examination and surgical treatment:  The anatomy & physiology of the anorectal region was discussed.  We discussed the pathophysiology of anorectal abscess and fistula.  Differential diagnosis was discussed.  Natural history progression was discussed.   I stressed the importance of a bowel regimen to have daily soft bowel movements to minimize progression of disease.     The patient's condition is not adequately controlled.  Non-operative treatment has not healed the fistula.  Therefore, I recommended examination under anaesthesia to confirm the diagnosis and treat the fistula.  I discussed techniques that may be required such as fistulotomy, ligation by LIFT technique, and/or seton placement.  Benefits & alternatives discussed.  I noted a good likelihood this will help address the problem, but sometimes repeat operations and prolonged healing times may occur.  Risks such as bleeding, pain, recurrence, reoperation, incontinence, heart attack, death, and other risks were discussed.      Educational handouts further explaining the pathology, treatment options, and bowel regimen were given.  The patient expressed understanding & wishes to proceed.  We will work to coordinate surgery for a mutually convenient time.   OR FINDINGS:  Patient had a chronic abscess cavity with intersphincteric fistula.  Patient had evidence of thickening consistent with an old anal fissure.  Possible chronic exacerbation.  No increased sphincter tone argues against major acute fissure.  External location RIGHT MIDLINE   about 3.5 cm from anal verge.  Internal location : Posterior midline anal canal about 1 cm from anal verge.  Large right lateral grade 3 hemorrhoid irritated.  Ligation pexing resection done.  Left lateral hemorrhoid at least grade 2 rather inflamed.  Ligation pexy resection done.  Left  anterior grade 2 hemorrhoid  irritated.  Ligation pexy only done.  DESCRIPTION:   Informed consent was confirmed. Patient underwent general anesthesia without difficulty. Patient was placed into prone/jacknife positioning.  The perianal region was prepped and draped in sterile fashion. Surgical timeout confirmed or plan.  I did digital rectal examination and then transitioned over to anoscopy to get a sense of the anatomy.  Findings as noted above.  Patient had inflammation and stellate scarring suspicious for the external opening.  However cannot get a good probe.  In the posterior midline on anoscopy I could see a dimpling which I could express some thin yellow drainage consistent with a fistula tract.  I can place a shepherd's hook probe and it seemed to be going through the sphincter complex towards the right posterior midline area of swelling and scar.  The posterior midline anal canal was rather thinned out suspicious for chronic anal fissure versus an priorly healed fissure.  I felt swelling and a cord going to the sphincter concerning for intersphincteric involvement.  I went ahead and proceeded with the LIFT technique.  I started externally to excise the external opening with a radial biconcave incision around it.  I gradually dissected around the chronic fistulous tract and headed proximally towards the sphincter component & confirm suspicions of sphincter involvement.  Drained a small abscess cavity with this.  Fistula tract partially obliterated.  I could probe through the base consistent with an intersphincteric fistula      I then focused on the internal component using Parks retractor alternating with a large Marketing executive.  Placed a 2-0 Vicryl suture 6 cm proximal anal verge over the internal opening, involving the right posterior hemorrhoidal column.  Did interrupted running figure-of-eight sutures do hemorrhoidal ligation and pexy.  Came down close to the internal opening.  I made a longitudinal biconcave  incision of distal rectal mucosa and anal canal around the internal opening.  I did this to carefully identify the sphincter complex.  I carefully went between the internal and external sphincter using careful blunt dissection parallel to the fibers.  I was able to locate the intersphincteric component of the fistulous tract.  I was able to get around it gently with a right angle clamp.  I carefully skeletonized the intersphincteric component.  I placed 2-0 Vicryl stitches through the intersphincteric tract on the proximal side and on the distal side in between the external & internal sphincters.  I transected the intersphincteric segment of the fistulous tract.   I ligated the stumps of the transected segments with 2-0 Vicryl again with a figure-of-eight stitch in a 90 degree fashion to doubly ligate and prove that the tract had been closed.  I then ran the 2-0 Vicryl stitch down to the anal verge to also help cover up the intersphincteric ligation wound as well.  I tied that running suture down over the anal retractor, thus closing the internal opening and protecting the LIFT repair without causing any luminal narrowing.  I focused on the hemorrhoidal piles. I used a 2-0 Vicryl suture on a UR-6 needle in a figure-of-eight fashion 6 cm proximal to the anal verge.  I started at the largest hemorrhoid pile, right lateral which was thickened and at least grade 3.  Because of redundant hemorrhoidal tissue too bulky to merely ligate or pexy, I excised the excess internal hemorrhoid piles longitudinally in a fusiform biconcave fashion, sparing the anal canal to avoid narrowing.  I then ran the running interrupted  2-0 Vicryl suture longitudinally along that hemorrhoidal column more distally to close the hemorrhoidectomy wound to the anal verge over a large Parks self-retaining anal retractor to avoid narrowing of the anal canal.  I then tied that stitch down to cause a hemorrhoidopexy.   I also had to do an excision at  the  left lateral pile locations.  I then did hemorrhoidal ligation and pexy at the other 3 hemorrhoidal columns.  At the completion of this, all 6 anorectal columns were ligated and pexied in the classic hexagonal fashion (right anterior/lateral/posterior, left anterior/lateral/posterior).    I removed the superficial external end of the fistulous tract & ligated the base of the external wound just outside the sphincter component with 2-0 Vicryl excised as says perirectal tissue to have a broad open flat wound.  Because the wound was deep I did place some figure-of-eight Vicryl sutures at the base to help close the base down to make it more superficial.  I excised skin to have a 35 x 30 mm flat wound in the right posterior midline.     I reexamined the anal canal.   Because the sphincter complex a dilated up rather easily I held off on doing any internal sphincterotomy since I think a lot of his pain and increased sphincter tone in the office was due to the chronic fistula abscess.  There is was no narrowing.  Hemostasis was excellent.  I repeated anoscopy and examination.  Hemostasis was good.  We placed fluff gauze to onlay over the wounds.  No packing done.    I discussed operative plans and postop recovery her condition of the patient's in the holding area.  Instructions written and prescription sent.  I discussed operative findings, updated the patient's status, discussed probable steps to recovery, and gave postoperative recommendations to the patient's daughter, Rosario Jacks  .  Recommendations were made.  Questions were answered.  She expressed understanding & appreciation.  Ardeth Sportsman, M.D., F.A.C.S. Gastrointestinal and Minimally Invasive Surgery [facility] [address] [place], [place] [address] [phone] Main / Paging

## 2024-03-24 NOTE — Anesthesia Procedure Notes (Signed)
 Procedure Name: Intubation Date/Time: 03/24/2024 3:05 PM  Performed by: Vanessa Bryantown, CRNAPre-anesthesia Checklist: Patient identified, Emergency Drugs available, Suction available and Patient being monitored Patient Re-evaluated:Patient Re-evaluated prior to induction Oxygen Delivery Method: Circle system utilized Preoxygenation: Pre-oxygenation with 100% oxygen Induction Type: IV induction Ventilation: Two handed mask ventilation required Laryngoscope Size: 2 and Miller Grade View: Grade I Tube type: Oral Tube size: 7.5 mm Number of attempts: 1 Airway Equipment and Method: Stylet Placement Confirmation: ETT inserted through vocal cords under direct vision, positive ETCO2 and breath sounds checked- equal and bilateral Secured at: 22 cm Tube secured with: Tape Dental Injury: Teeth and Oropharynx as per pre-operative assessment

## 2024-03-24 NOTE — H&P (Signed)
 03/24/2024    REFERRING PHYSICIAN: Leta Baptist, PA  Patient Care Team: Leta Baptist, PA as PCP - General (Gastroenterology) Rennis Golden, Raye Sorrow, MD (Cardiovascular Disease)  PROVIDER: Jarrett Soho, MD  [facility] MRN: [id number] DOB: 1956-01-23  SUBJECTIVE   Chief Complaint: New Consultation ( Perianal pain)   Arthur Church is a 68 y.o. male  who is seen today as an office consultation  at the request of PA. Zehr  for evaluation of perianal pain. Possible fistula.  History of Present Illness:  68 year old male. Followed by [facility] gastroenterology. Had several month history of some perianal pain with some bleeding discomfort. Exam is concerning for possible fistula which MRI rate suspicion of an intersphincteric posterior fistula. Underwent colonoscopy by Dr. Lavon Paganini. For screening 1. Numerous adenomatous polyps removed. No other major problems. Internal hemorrhoids noted. Surgical consultation requested.  Patient notes for several months he has had pain and burning. Almost fully get a lump. He gets leaking down there. Hard to keep the area clean and dry. If he passes gas he gets some leakage. Frustrating piercing. He is on a diuretic so sometimes he does get some stress urinary incontinence. However he has to get up to urinate maybe once or twice a night but no other prostate issues that he is aware of. Does not have a urologist. He claims to move his bowels every day usually in the morning. Has transition to a high-fiber diet given his cardiac issues. Patient does have a history of coronary disease and has had atrial fibrillation followed by Dr. Rennis Golden in [facility] group. Anticoagulated on Eliquis.  Medical History:  Past Medical History:  Diagnosis Date  Anxiety  Arrhythmia  Arthritis  CHF (congestive heart failure) (CMS/HHS-HCC)  COPD (chronic obstructive pulmonary disease) (CMS/HHS-HCC)  GERD (gastroesophageal reflux disease)   Hyperlipidemia  Hypertension  Sleep apnea   Patient Active Problem List  Diagnosis  History of adenomatous polyp of colon  Intersphincteric fistula  Anal pain  Rectal bleeding  Morbid obesity with BMI of 45.0-49.9, adult (CMS/HHS-HCC)  Chronic anticoagulation  History of atrial fibrillation  Hx of CABG   Past Surgical History:  Procedure Laterality Date  open heart bypass    No Known Allergies  Current Outpatient Medications on File Prior to Visit  Medication Sig Dispense Refill  acetaminophen (TYLENOL) 500 MG tablet Take 500 mg by mouth  ALPRAZolam (XANAX) 0.5 MG tablet Take 0.5 mg by mouth at bedtime as needed  apixaban (ELIQUIS) 5 mg tablet Take 5 mg by mouth 2 (two) times daily  atorvastatin (LIPITOR) 80 MG tablet Take 80 mg by mouth once daily  carvediloL (COREG) 12.5 MG tablet Take 12.5 mg by mouth  FUROsemide (LASIX) 40 MG tablet Take 1 tablet by mouth 2 (two) times daily  HELIUM-OXYGEN INHAL Inhale 2-3 L into the lungs  loratadine (CLARITIN) 10 mg tablet Take 10 mg by mouth once daily  montelukast (SINGULAIR) 10 mg tablet Take 10 mg by mouth at bedtime  pantoprazole (PROTONIX) 40 MG DR tablet Take 40 mg by mouth once daily  spironolactone (ALDACTONE) 25 MG tablet TAKE 1/2 TABLET(12.5 MG) BY MOUTH DAILY  TRELEGY ELLIPTA 100-62.5-25 mcg inhaler Inhale 1 Puff into the lungs once daily   No current facility-administered medications on file prior to visit.   Family History  Problem Relation Age of Onset  Breast cancer Mother    Social History   Tobacco Use  Smoking Status Former  Types: Cigarettes  Smokeless Tobacco Never  Social History   Socioeconomic History  Marital status: Widowed  Tobacco Use  Smoking status: Former  Types: Cigarettes  Smokeless tobacco: Never  Substance and Sexual Activity  Alcohol use: Yes  Alcohol/week: 6.0 standard drinks of alcohol  Types: 6 Standard drinks or equivalent per week  Comment: a week  Drug use: Never    Social Drivers of Corporate investment banker Strain: Low Risk (04/20/2023)  Received from [facility]  Overall Financial Resource Strain (CARDIA)  Difficulty of Paying Living Expenses: Not hard at all  Food Insecurity: Low Risk (08/12/2023)  Received from [facility]  Hunger Vital Sign  Worried About Running Out of Food in the Last Year: Never true  Ran Out of Food in the Last Year: Never true  Transportation Needs: No Transportation Needs (08/12/2023)  Received from [employer]  In the past 12 months, has lack of reliable transportation kept you from medical appointments, meetings, work or from getting things needed for daily living? : No  Social Connections: Unknown (07/18/2019)  Received from [facility]  Social Connection and Isolation Panel [NHANES]  Marital Status: Widowed  Housing Stability: Unknown (03/15/2024)  Housing Stability Vital Sign  Homeless in the Last Year: No   ############################################################  Review of Systems: A complete review of systems (ROS) was obtained from the patient.  We have reviewed this information and discussed as appropriate with the patient.  See HPI as well for other pertinent ROS.  Constitutional: No fevers, chills, sweats. Weight stable Eyes: No vision changes, No discharge HENT: No sore throats, nasal drainage Lymph: No neck swelling, No bruising easily Pulmonary: No cough, productive sputum CV: No orthopnea, PND . No exertional chest/neck/shoulder/arm pain. Patient can walk 20 minutes gradually.   GI: No personal nor family history of GI/colon cancer, inflammatory bowel disease, irritable bowel syndrome, allergy such as Celiac Sprue, dietary/dairy problems, colitis, ulcers nor gastritis. No recent sick contacts/gastroenteritis. No travel outside the country. No changes in diet.  Renal: No UTIs, No hematuria Genital: No drainage, bleeding, masses Musculoskeletal: No severe joint pain. Good  ROM major joints Skin: No sores or lesions Heme/Lymph: No easy bleeding. No swollen lymph nodes Neuro: No active seizures. No facial droop Psych: No hallucinations. No agitation  OBJECTIVE   Vitals:  03/15/24 1338  BP: 129/77  Pulse: 73  Temp: 36.1 C (97 F)  SpO2: 93%  Weight: (!) 161 kg (355 lb)  Height: 185.4 cm (6\' 1" )   Body mass index is 46.84 kg/m.  PHYSICAL EXAM:  Constitutional: Not cachectic. Hygeine adequate. Vitals signs as above.  Eyes: No glasses. Vision adequate,Pupils reactive, normal extraocular movements. Sclera nonicteric Neuro: CN II-XII intact. No major focal sensory defects. No major motor deficits. Lymph: No head/neck/groin lymphadenopathy Psych: No severe agitation. No severe anxiety. Judgment & insight Adequate, Oriented x4, HENT: Normocephalic, Mucus membranes moist. No thrush. Hearing: adequate Neck: Supple, No tracheal deviation. No obvious thyromegaly Chest: No pain to chest wall compression. Good respiratory excursion. No audible wheezing CV: Pulses intact. irregular. No major extremity edema Ext: No obvious deformity or contracture. Edema: Not present. No cyanosis Skin: No major subcutaneous nodules. Warm and dry Musculoskeletal: Severe joint rigidity not present. No obvious clubbing. No digital petechiae. Mobility: no assist device moves with minimal assistance  Abdomen: Obese with panniculus Soft. Nondistended. Nontender. Hernia: Not present. Diastasis recti: Not present. No hepatomegaly. No splenomegaly.  Genital/Pelvic: Inguinal hernia: Not present. Inguinal lymph nodes: without lymphadenopathy nor hidradenitis.   Rectal:  Perianal skin  Mild fecal soiling  Pruritis ani: Moderate Pilonidal disease: Not present  External hemorrhoids: Not present Condyloma / warts: Not present Anal fissure: Not present Perirectal abscess/fistula: Left posterior midline about 2 cm anal verge is an opening sinus with a cord going to posterior midline  suspicious for fistula. Rather sensitive. No undrained abscess. Sphincter tone: Increased   Digital and anoscopic rectal exam: Not tolerated = Aborted   Patient examined with patient in decubitus position .  ###################################    ###################################################################  Labs, Imaging and Diagnostic Testing:  Located in 'Care Everywhere' section of Epic EMR chart  PRIOR [facility] CLINIC NOTES:  Not applicable  SURGERY NOTES:  Not applicable  PATHOLOGY:  Located in 'Care Everywhere' section of Epic EMR chart  Assessment and Plan:  DIAGNOSES:  Diagnoses and all orders for this visit:  Intersphincteric fistula  History of adenomatous polyp of colon  Anal pain  Rectal bleeding  Morbid obesity with BMI of 45.0-49.9, adult (CMS/HHS-HCC)  Chronic anticoagulation  History of atrial fibrillation  Hx of CABG    ASSESSMENT/PLAN  68 year old male with rectal pain and drainage with exam and MRI suspicious for intersphincteric fistula.  He clearly is miserable with this. Causing a lot of leaking burning and irritation. I think he will benefit from anorectal examination under esthesia. Possible LIFT repair versus fistulotomy if it truly is superficial. Will give an opportunity to examine the anorectal canal and rule out out hemorrhoids or other issues. Hopefully just an outpatient surgery. Suspect given his A-fib and morbid obesity that not a good candidate ACE EGD so see if can do [place] on campus. He is going vacation in late May was hoping get it done before then. I have you times available next week. Will see if they can fit him in. Otherwise may need to wait till June.  The anatomy & physiology of the anorectal region was discussed. We discussed the pathophysiology of anorectal abscess and fistula. Differential diagnosis was discussed. Natural history progression was discussed. I stressed the importance of a bowel regimen to have  daily soft bowel movements to minimize progression of disease.   The patient's condition is not adequately controlled. Non-operative treatment has not healed the fistula. Therefore, I recommended examination under anaesthesia to confirm the diagnosis and treat the fistula. I discussed techniques that may be required such as fistulotomy, ligation by LIFT technique, and/or seton placement. Benefits & alternatives discussed. I noted a good likelihood this will help address the problem, but sometimes repeat operations and prolonged healing times may occur. Risks such as bleeding, pain, recurrence, reoperation, injury to other organs, need for repair of tissues / organs reoperation, incontinence, heart attack, death, and other risks were discussed.   Educational handouts further explaining the pathology, treatment options, and bowel regimen were given. The patient expressed understanding & wishes to proceed. We will work to coordinate surgery for a mutually convenient time.   He did require coronary bypass grafting in 2023 is followed by cardiology anticoagulated on Eliquis for atrial fibrillation. He had required antiarrhythmic medications. This is not a particularly high risk surgery, but asked for cardiac clearance just the same.  Cleared by Dr. Rennis Golden with cardiology  Ardeth Sportsman, MD, FACS, MASCRS Esophageal, Gastrointestinal & Colorectal Surgery Robotic and Minimally Invasive Surgery  [facility] [employer] [address], [address] [place], [place] [address] [phone] Fax [phone] Main  CONTACT INFORMATION: Weekday (9AM-5PM): Call [facility] main office at [phone] Weeknight (5PM-9AM) or Weekend/Holiday:  Check EPIC "Web Links" tab & use "AMION" (password " [email or web address]") for General Surgery [facility] coverage  Please, DO NOT use SecureChat  (it is not reliable communication to reach operating surgeons & will lead to a delay in care).   Epic staff  messaging available for outptient concerns needing 1-2 business day response.       03/24/2024

## 2024-03-24 NOTE — Assessment & Plan Note (Addendum)
-   On Eliquis and Coreg at home -Continue Coreg - Some mild streaking of blood noted on his chair - eliquis resumed 4/12 per surgery

## 2024-03-24 NOTE — Discharge Instructions (Addendum)
 ##############################################  ANORECTAL SURGERY:  POST OPERATIVE INSTRUCTIONS  ######################################################################  EAT Start with a pureed / full liquid diet After 24 hours, gradually transition to a high fiber diet.    CONTROL PAIN Control pain so you can tolerate bowel movements,  walk, sleep, tolerate sneezing/coughing, and go up/down stairs.   HAVE A BOWEL MOVEMENT DAILY Keep your bowels regular to avoid problems.   Taking a fiber supplement 2-4 doses every day to keep bowels soft.   HAVE A BM BY THE SECOND POSTOPERATIVE DAY Use an antidairrheal to slow down diarrhea.   Call if not better after 2 tries  WALK Walk an hour a day.  Control your pain to do that.   CALL IF YOU HAVE PROBLEMS/CONCERNS Call if you are still struggling despite following these instructions. Call if you have concerns not answered by these instructions  ######################################################################    Take your usually prescribed home medications unless otherwise directed. Blood thinners:  You can restart any strong blood thinners the morning after surgery = Saturday  for example: COUMADIN (warfarin), XERELTO (rivaroxaban), ELIQUIS (apixaban), PLAVIX (clopidigrel), BRILINTA (ticagrelor), EFFIENT (prasugrel), PRADAXA (dabigatran), etc  Continue aspirin before & after surgery..     Some oozing/bleeding the first 1-2 weeks is common but should taper down & be small volume.    If you are passing many large clots or having uncontrolling bleeding, call your surgeon  DIET: Follow a light bland diet & liquids the first 24 hours after arrival home, such as soup, liquids, starches, etc.  Be sure to drink plenty of fluids.  Quickly advance to a usual solid diet within a few days.  Avoid fast food or heavy meals as your are more likely to get nauseated or have irregular bowels.  A low-fat, high-fiber diet for the rest of your life  is ideal.  Control pain to avoid problems with urinating or defecating:  Pain is best controlled by a usual combination of many methods TOGETHER: Warm baths/soaks or Ice packs Over the counter pain medication Prescription pain medications Topical creams  A.  Warm water baths or ice packs (30-60 minutes up to 8 times a day, especially after bowel meovements) will help.  Using ice for the first few days can help decrease swelling and bruising,  Use heat such as warm towels, sitz baths, warm baths, warm showers, etc to help relax tight/sore spots and speed recovery.   Some people prefer to use ice alone, heat alone, alternating between ice & heat.  Experiment to what works for you.    It is helpful to take an over-the-counter pain medication continuously for the first few weeks.  This helps people need to use less narcotics Choose one of the following that works best for you: Naproxen (Aleve, etc)  Two 220mg  tabs twice a day Ibuprofen (Advil, etc) Three 200mg  tabs four times a day (every meal & bedtime) Acetaminophen (Tylenol, etc) 500-650mg  four times a day (every meal & bedtime)  A  prescription for pain medication (such as oxycodone, hydrocodone, etc) should be given to you upon discharge.  Take your pain medication as prescribed.  If you are having problems/concerns with the prescription medicine (does not control pain, nausea, vomiting, rash, itching, etc), please call us [phone] to see if we need to switch you to a different pain medicine that will work better for you and/or control your side effect better. Most people will need 1-2 refills to get through the first couple of weeks when pain is the  most intense.  If you need a refill on your pain medication, please contact your pharmacy.  They will contact our office to request authorization. Prescriptions will not be filled after 5 pm or on week-ends.  If can take up to 48 hours for it to be filled & ready so avoid waiting until you  are down to thel ast pill.  A numbing topical cream (Dibucaine) may be given to you.  Many people find relief with topical creams.  Some people find it burns too much.  Experiment.  If it helps, use it.  If it burns, stop using it.  You also may receive a prescription for diazepam, a muscle relaxant to help you to be able to urinate at first easily.  It is safe to take a few doses with the other medications as long as you are not planning to drive or do anything intense.  Hopefully this can minimize the chance of needing a Foley catheter into your bladder.  Do not take that as the same time as your alprazolam/Xanax.  One or the other.     Have a bowel movement every day  Until you have a bowel movement after surgery, he will struggle with urinating and controlling her pain.  Take a fiber supplement (such as Metamucil, Citrucel, FiberCon, MiraLax, etc) 2-4 doses a day to help prevent constipation from occurring.     If you have not had a bowel movement the second postoperative day:  -switch to drinking liquids only -take MiraLAX double dose every 2 hours x 3 or until you have a BM -If that does not work x 3 doses, increase to 4 doses every 2 hours (Like a small bowel prep) until you have a bowel movement. -Avoid enemas or suppositories (can harm sutures/repair) -Once you start having bowel movements, adjust your fiber supplement or Miralax dosing back down until you are having 1-2 soft well formed BMs a day.  (Start at 2 doses a day & adjust up or down to avoid diarrhea or recurrent constipation)   Watch out for diarrhea.  If you have many loose bowel movements, simplify your diet to bland foods & liquids for a few days.  Stop any stool softeners and decrease your fiber supplement.  Switching to mild anti-diarrheal medications (Kayopectate, Pepto Bismol) can help.  Can try an imodium/loperamide dose.  If this worsens or does not improve, please call us.  Wound Care   a. You have some  fluffed cotton on top of the anus to help catch drainage and bleeding.  THERE IS NO PACKING INSIDE THE RECTUM -Let the cotton fall off with the first bowel movement or shower.  It is okay to reinforce or replace as needed.  Bleeding is common at first and gradually slows down after a few weeks   b. Place soft cotton balls on the anus/wounds and use an absorbent pad in your underwear as needed to catch any drainage and help keep the area.  Try to use cotton balls or pads (regular gauze or toilet paper will stick and pull, causing pain.  Cotton will come off more easily).  OK to try dry powders such as cornstarch or baby powders   c. Keep the area clean and dry.  Bathe / shower every day.  Keep the area clean by showering / bathing over the incision / wound.   It is okay to soak an open wound to help wash it.  Consider using a squeeze bottle filled with warm  water to gently wash the anal area.  Wet wipes or showers / gentle washing after bowel movements is often less traumatic than regular toilet paper.           D.  Use a Sitz Bath 4-8 times a day for relief  A sitz bath is a warm water bath taken in the sitting position that covers only the hips and buttocks. It may be used for either healing or hygiene purposes. Sitz baths are also used to relieve pain, itching, or muscle spasms.  Gently cleaned the area and the heat will help lower spasm and offer better pain control.    Fill the bathtub half full with warm water. Sit in the water and open the drain a little. Turn on the warm water to keep the tub half full. Keep the water running constantly. Soak in the water for 15 to 20 minutes. After the sitz bath, pat the affected area dry first.   d. You will often notice bleeding, especially with bowel movements.  This should slow down by the end of the first week of surgery.  It can take over a month to more fully stop.  Sitting on an ice pack can help.   e. Expect some drainage.  You often will have some  blood or yellow drainage with open wounds.  Sometimes she will get a little leaking of liquid stool until the incision/wounds have fully close down.  This should slow down by the end of the first week of surgery, but you will have occasional bleeding or drainage up to a few months after surgery until all incisions & wounds have closed.  Wear an absorbent pad or soft cotton gauze in your underwear until the drainage stops.  ACTIVITIES as tolerated:    You may resume regular (light) daily activities beginning the next day--such as daily self-care, walking, climbing stairs--gradually increasing activities as tolerated.  If you can walk 30 minutes without difficulty, it is safe to try more intense activity such as jogging, treadmill, bicycling, low-impact aerobics, swimming, etc. Save the most intensive and strenuous activity for last such as sit-ups, heavy lifting, contact sports, etc  Refrain from any heavy lifting or straining until you are off narcotics for pain control.   DO NOT PUSH THROUGH PAIN.  Let pain be your guide: If it hurts to do something, don't do it.  Pain is your body warning you to avoid that activity for another week until the pain goes down. You may drive when you are no longer taking prescription pain medication, you can comfortably sit for long periods of time, and you can safely maneuver your car and apply brakes. You may have sexual intercourse when it is comfortable.   6.  FOLLOW UP in our office Please call [facility] at [phone] to set up an appointment to see your surgeon in the office for a follow-up appointment approximately 3 weeks after your surgery. If tissue is sent for pathology, it usually takes a week for pathology results to come back.  We will make an effort to call you with results as soon as we get them.  If you have not heard back in a week and wish to know the results before your postop visit, please call our office and we will see if we can give feedback on the  results Make sure that you call for this appointment the day you arrive home to ensure a convenient appointment time.  7. IF YOU HAVE DISABILITY  OR FAMILY LEAVE FORMS, BRING THEM TO THE OFFICE FOR PROCESSING.  DO NOT GIVE THEM TO YOUR DOCTOR.        WHEN TO CALL us [phone]: Poor pain control Reactions / problems with new medications (rash/itching, nausea, etc)  Fever over 101.5 F (38.5 C) Inability to urinate Nausea and/or vomiting Worsening swelling or bruising Continued bleeding from incision. Increased pain, redness, or drainage from the incision  The clinic staff is available to answer your questions during regular business hours (8:30am-5pm).  Please don't hesitate to call and ask to speak to one of our nurses for clinical concerns.   A surgeon from [facility] is always on call at the hospitals   If you have a medical emergency, go to the nearest emergency room or call 911.    [facility] [address], [place], [place]  [address] ? MAIN: [phone] ? TOLL FREE: [phone] ? FAX [phone] [email or web address]  #####################################################

## 2024-03-24 NOTE — Assessment & Plan Note (Addendum)
-   Suspected OSA but also known COPD per patient.  Also with his body habitus, at risk for atelectasis.  He describes lingering hypoxia in the morning despite wearing nocturnal oxygen which also supports some probable atelectasis while sleeping -Lower suspicion for aspiration and will d/c abx at this time; leukocytosis likely reactive esp with increase despite abx - monitor off abx - continue encouraging incentive spirometer and ambulating -Lasix okay for now; extra dose given on 4/13 to help with any residual overload; still some edema in legs - okay from IM standpoint to d/c home; he can continue weaning O2 at home; do not see any other acute need to remain in hospital or need for further workup

## 2024-03-24 NOTE — Consult Note (Addendum)
 Initial Consultation Note   Patient: Arthur Church ZOX:[id number] DOB: May 22, 1956 PCP: Chyrel Masson DOA: 03/24/2024 DOS: the patient was seen and examined on 03/24/2024 Primary service: Karie Soda, MD  Referring physician: Dr. Michaell Cowing Reason for consult: hypoxia.  Assessment/Plan: Assessment and Plan: Acute respiratory failure with hypoxia (HCC) Due to atelectasis most apparent right lung base - risk factors included priro known OSA, obesity and COPD. C.w. incentive spirometry. Pending CBC. Cannot rule out aspiration at thisime. Empiric unasyn. Patient received perioperative ceftraxeon.  Inspite of hypoxia and new tachycardia mentioned above, I think there is sufficient explanation of pateint symptoms to not worry about VTE. Since pateint has indication of anticoagulation any way - CT PE study will not change management and expose pateint to additional risk. Surgery plans to allow apixaban therapy in AM   PAF (paroxysmal atrial fibrillation) (HCC) Patient eliquis being held for OR - restart per surgery. Notd to develop tachycardia on the floor - patient is on rhythm control strategy. But may have reverted to afib. Check EKG. Maintain on telemetry tonight. I wll engage cardiology fellow.    [facility] will continue to follow the patient. I will request flow managers to kindly assign a hospitalist team to this patient in the AM ______________________________________________________________________________________________________ HPI: HOBSON LAX is a 68 y.o. male with past medical history of heart failure with preserved EF, paroxysmal atrial fibrillation, prediabetes, CAD, morbid obesity, OSA, chronic nocturnal hypoxia, COPD, nonischemic cardiomyopathy.   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 Preoperative vitals frm 03/22/2024 show spo2 96% without any mention of o2 device. Sicne PACU, aptient has been unable to be weaned off o2. Being maintained on 2-3 lpm o2.  Medical eval is sought.  Patient deneis chest apin, any pleuritic disocmfort, no coughing, no sob, no leg swelling, no palpitation, no wheezing. Does report sore throat from anesthesia, trouble talking. Tolerated diet. Only symptom is perirectal discomfort.  Did receive duoneb and breztri inhalation.    Review of Systems: As mentioned in the history of present illness. All other systems reviewed and are negative. Past Medical History:  Diagnosis Date   Anxiety    Chronic systolic CHF (congestive heart failure) (HCC)    a. 2D ECHO (03/06/15) EF 20-25%, diffuse HK. Asc aortic diameter: 40mm. Mild LA dilation, mild RV dilation. Mild RV systolic dysfunction   b. LifeVest placed on 02/2015 admissoin    Coronary artery disease    a. 70% LAD lesion   Dysrhythmia    Elevated TSH    ETOH abuse    Hypertension    Morbid obesity (HCC)    a. BMI ~46   Persistent atrial fibrillation (HCC)    a. newly dx 02/2015 admission. s/p successful TEE/DCCV  b. on Eliquis   Pre-diabetes    a. HgA1c 6.1   Past Surgical History:  Procedure Laterality Date   ATRIAL FIBRILLATION ABLATION N/A 01/28/2023   Procedure: ATRIAL FIBRILLATION  ABLATION;  Surgeon: Regan Lemming, MD;  Location: [facility] INVASIVE CV LAB;  Service: Cardiovascular;  Laterality: N/A;   CARDIAC CATHETERIZATION N/A  04/26/2015   Procedure: Left Heart Cath and Coronary Angiography;  Surgeon: Lyn Records, MD;  Location: [facility] INVASIVE CV LAB;  Service: Cardiovascular;  Laterality: N/A;   CARDIOVERSION N/A 03/06/2015   Procedure: CARDIOVERSION;  Surgeon: Laurey Morale, MD;  Location: [facility] ENDOSCOPY;  Service: Cardiovascular;  Laterality: N/A;   CARDIOVERSION N/A 06/02/2022   Procedure: CARDIOVERSION;  Surgeon: Vesta Mixer, MD;  Location: [facility] ENDOSCOPY;  Service: Cardiovascular;  Laterality: N/A;   CARDIOVERSION N/A 07/14/2022   Procedure: CARDIOVERSION;  Surgeon: Thurmon Fair, MD;  Location: [facility] ENDOSCOPY;  Service: Cardiovascular;  Laterality: N/A;   CATARACT EXTRACTION, BILATERAL Bilateral    COLONOSCOPY WITH PROPOFOL N/A 02/24/2024   Procedure: COLONOSCOPY WITH PROPOFOL;  Surgeon: Napoleon Form, MD;  Location: [facility] ENDOSCOPY;  Service: Gastroenterology;  Laterality: N/A;   CORONARY ANGIOGRAPHY N/A 03/18/2022   Procedure: CORONARY ANGIOGRAPHY;  Surgeon: Kathleene Hazel, MD;  Location: [facility] INVASIVE CV LAB;  Service: Cardiovascular;  Laterality: N/A;   CORONARY ARTERY BYPASS GRAFT N/A 03/24/2022   Procedure: CORONARY ARTERY BYPASS GRAFTING (CABG) TIMES TWO USING LEFT INTERNAL MAMMARY ARTERY AND ENDOSCOPICALLY HARVESTED RIGHT GREATER SAPHENOUS VEIN;  Surgeon: Lovett Sox, MD;  Location: [facility] OR;  Service: Open Heart Surgery;  Laterality: N/A;   ENDOSCOPIC MUCOSAL RESECTION  02/24/2024   Procedure: RESECTION, MUCOSAL LESION, GI TRACT, ENDOSCOPIC;  Surgeon: Napoleon Form, MD;  Location: [facility] ENDOSCOPY;  Service: Gastroenterology;;   ENDOVEIN HARVEST OF GREATER SAPHENOUS VEIN Right 03/24/2022   Procedure: ENDOVEIN HARVEST OF GREATER SAPHENOUS VEIN;  Surgeon: Lovett Sox, MD;  Location: [facility] OR;  Service: Open Heart Surgery;  Laterality: Right;   HERNIA REPAIR     LEFT HEART CATH N/A 03/16/2022   Procedure: Left Heart Cath;  Surgeon: [name], MD;  Location: [facility] INVASIVE CV LAB;   Service: Cardiovascular;  Laterality: N/A;   POLYPECTOMY  02/24/2024   Procedure: POLYPECTOMY;  Surgeon: Napoleon Form, MD;  Location: [facility] ENDOSCOPY;  Service: Gastroenterology;;   TEE WITHOUT CARDIOVERSION N/A 03/06/2015   Procedure: TRANSESOPHAGEAL ECHOCARDIOGRAM (TEE);  Surgeon: Laurey Morale, MD;  Location: [facility] ENDOSCOPY;  Service: Cardiovascular;  Laterality: N/A;   TEE WITHOUT CARDIOVERSION N/A 05/02/2015   Procedure: TRANSESOPHAGEAL ECHOCARDIOGRAM (TEE);  Surgeon: Lewayne Bunting, MD;  Location: [facility] ENDOSCOPY;  Service: Cardiovascular;  Laterality: N/A;   TEE WITHOUT CARDIOVERSION N/A 03/24/2022   Procedure: TRANSESOPHAGEAL ECHOCARDIOGRAM (TEE);  Surgeon: Lovett Sox, MD;  Location: [facility] OR;  Service: Open Heart Surgery;  Laterality: N/A;   Social History:  reports that he quit smoking about 11 years ago. His smoking use included cigarettes. He has never used smokeless tobacco. He reports current alcohol use. He reports that he does not use drugs.  No Known Allergies  Family History  Problem Relation Age of Onset   Heart attack Mother    Breast cancer Mother    Uterine cancer Sister    Sleep apnea Brother    Sleep apnea Brother    Heart attack Maternal Grandfather    Colon cancer Neg Hx    Liver cancer Neg Hx    Stomach cancer Neg Hx    Esophageal cancer Neg Hx     Prior to Admission medications   Medication Sig Start Date End Date Taking? Authorizing Provider  acetaminophen (TYLENOL) 500 MG tablet Take 500 mg by mouth every 6 (six) hours  as needed for mild pain, moderate pain or headache.   Yes [provider]  ALPRAZolam Prudy Feeler) 0.5 MG tablet TAKE 1 TABLET BY MOUTH THREE TIMES DAILYAS NEEDED FOR ANXIETY 08/04/19  Yes Monica Becton, MD  AMBULATORY NON FORMULARY MEDICATION Portable oxygen concentrator, 3 L [place].  Please fax to [facility] 03/17/19  Yes Monica Becton, MD  apixaban (ELIQUIS) 5 MG TABS tablet Take 1 tablet (5 mg total) by mouth 2  (two) times daily. 06/15/23  Yes Milford, Anderson Malta, FNP  atorvastatin (LIPITOR) 80 MG tablet Take 1 tablet (80 mg total) by mouth daily. 03/20/24  Yes Hilty, Lisette Abu, MD  carvedilol (COREG) 6.25 MG tablet TAKE 1 TABLET(6.25 MG) BY MOUTH TWICE DAILY WITH A MEAL 02/22/24  Yes Milford, [place], FNP  diazepam (VALIUM) 5 MG tablet Take 1 tablet (5 mg total) by mouth every 8 (eight) hours as needed for muscle spasms (difficulty urinating). 03/24/24  Yes Karie Soda, MD  furosemide (LASIX) 40 MG tablet Take 1 tablet (40 mg total) by mouth 2 (two) times daily. 02/18/24  Yes Hilty, Lisette Abu, MD  hydrocortisone 2.5 % cream Apply 1 Application topically daily as needed (rash, itching, irritation).   Yes [provider]  loratadine (CLARITIN) 10 MG tablet Take 10 mg by mouth daily.   Yes [provider]  Magnesium 400 MG TABS Take 400 mg by mouth 2 (two) times a day. 07/14/19  Yes Hilty, Lisette Abu, MD  montelukast (SINGULAIR) 10 MG tablet Take 10 mg by mouth at bedtime. 01/14/22  Yes [provider]  oxyCODONE-acetaminophen (PERCOCET) 10-325 MG tablet Take 1 tablet by mouth every 6 (six) hours as needed for pain. 03/24/24  Yes Karie Soda, MD  OXYGEN Inhale 2-3 L into the lungs at bedtime.   Yes [provider]  pantoprazole (PROTONIX) 40 MG tablet Take 1 tablet (40 mg total) by mouth daily. 11/30/19  Yes Monica Becton, MD  sacubitril-valsartan (ENTRESTO) 24-26 MG Take 1 tablet by mouth 2 (two) times daily. 05/31/23  Yes Andrey Farmer, PA-C  spironolactone (ALDACTONE) 25 MG tablet TAKE 1/2 TABLET(12.5 MG) BY MOUTH DAILY Patient taking differently: Take 12.5 mg by mouth every other day. 01/14/24  Yes Hilty, Lisette Abu, MD  TRELEGY ELLIPTA 100-62.5-25 MCG/ACT AEPB Inhale 1 puff into the lungs daily. 02/18/22  Yes [provider]  oxyCODONE (OXY IR/ROXICODONE) 5 MG immediate release tablet Take 1-2 tablets (5-10 mg total) by mouth every 6 (six) hours as needed  for severe pain (pain score 7-10). Patient not taking: Reported on 03/17/2024 03/06/24   Eustace Moore, MD  predniSONE (DELTASONE) 50 MG tablet Take once a day for 5 days.  Take with food Patient not taking: Reported on 03/17/2024 03/06/24   Eustace Moore, MD    Physical Exam: Vitals:   03/24/24 2015 03/24/24 2030 03/24/24 2045 03/24/24 2117  BP: 98/69 127/89 (!) 143/79 (!) 154/69  Pulse: 88 84 86 (!) 124  Resp: 17 20 15 18   Temp:   98.4 F (36.9 C) 98.5 F (36.9 C)  TempSrc:    Oral  SpO2: 96% 97% 99% 97%  Weight:      Height:       General - morbidly obese, no distres. On suplemental oxygen.  Resp - good excusoin. Ptient pulled in almost 2 liters on the incentive spiro.  Bilateral air entry is vesicular slightly diminished likely due to soft tissue.  There are crackles in the right lung base  posteriorly.  Otherwise no expiratory wheezes or adventitious breath sounds are heard Cardiovascular exam S1-S2 normal Abdomen all quadrants soft nontender Perirectal dressing apparent Extremities warm without edema no focal functional impairment  Data Reviewed:   Labs on Admission:  No results found for this or any previous visit (from the past 24 hours). Basic Metabolic Panel: Recent Labs  Lab 03/22/24 1421  NA 135  K 4.3  CL 99  CO2 30  GLUCOSE 138*  BUN 19  CREATININE 0.75  CALCIUM 9.3   Liver Function Tests: No results for input(s): "AST", "ALT", "ALKPHOS", "BILITOT", "PROT", "ALBUMIN" in the last 168 hours. No results for input(s): "LIPASE", "AMYLASE" in the last 168 hours. No results for input(s): "AMMONIA" in the last 168 hours. CBC: Recent Labs  Lab 03/22/24 1421  WBC 12.3*  HGB 14.0  HCT 45.6  MCV 91.4  PLT 271   Cardiac Enzymes: No results for input(s): "CKTOTAL", "CKMB", "CKMBINDEX", "TROPONINIHS" in the last 168 hours.  BNP (last 3 results) No results for input(s): "PROBNP" in the last 8760 hours. CBG: No results for input(s): "GLUCAP" in the last  168 hours.  Radiological Exams on Admission:  X-ray chest PA or AP Result Date: 03/24/2024 CLINICAL DATA:  Shortness of breath EXAM: CHEST  1 VIEW COMPARISON:  04/18/2023 FINDINGS: Prior CABG. Cardiomegaly. Elevation of the left hemidiaphragm. Mild vascular congestion. Bibasilar opacities, likely atelectasis. No acute bony abnormality. IMPRESSION: Cardiomegaly with vascular congestion.  Bibasilar atelectasis. Electronically Signed   By: Charlett Nose M.D.   On: 03/24/2024 19:56    chest X-ray  EKG: pending.  I/O last 3 completed shifts: In: 300 [I.V.:200; IV Piggyback:100] Out: 50 [Blood:50] No intake/output data recorded.       Primary team communication: d/w surgery. Thank you very much for involving [place] in the care of your patient.  Author: Nolberto Hanlon, MD 03/24/2024 9:41 PM  For on call review [email or web address].

## 2024-03-24 NOTE — Anesthesia Postprocedure Evaluation (Signed)
 Anesthesia Post Note  Patient: Arthur Church  Procedure(s) Performed: ANAL FISTULA REPAIR - LIFT (Ligation of Intersphincteric fistula tract) HEMORRHOIDAL LIGATION & HEMORRHOIDOPEXY ANORECTAL EXAMINATION UNDER ANESTHESIA HEMORRHOIDECTOMY X2     Patient location during evaluation: PACU Anesthesia Type: General Level of consciousness: patient cooperative and awake and alert Pain management: pain level controlled Vital Signs Assessment: vitals unstable and post-procedure vital signs reviewed and stable Respiratory status: spontaneous breathing and patient connected to nasal cannula oxygen Cardiovascular status: stable Anesthetic complications: no Comments: [name] had an extended PACU stay due to hypoxemia. After nebulizer treatment and incentive spirometry, we attempted to wean him of supplemental O2 as he is only supposed to be on O2 at night. With moving from the stretcher to a chair, SpO2 dropped to 60's. With him sitting in a chair, I had him do many deep breathing exercises and IS and he would only briefly get to 93%. Without IS he would quickly drop to the low 80's SpO2. CXR was obtained. Radiologist read did not provide a clear answer. On my read, his cardiomegally is worse and his RLL infiltrate? Looks worse. CXR from 04/2023 mentions repeating in 4-6 weeks, but that wasn't done. I asked the patient about pulmonology f/u and he said he is switching to new MD, and his first appointment isn't for another month. Based on his worsening findings on CXR, new requirement for O2 while awake, and profound hypoxemia with ambulation, I spoke to Dr. Michaell Cowing about keeping pt overnight. I also mentioned possibility of getting pulmonology to see him and set up f/u sooner.    No notable events documented.  Last Vitals:  Vitals:   03/24/24 2002 03/24/24 2015  BP: (!) 159/84 98/69  Pulse: 91 88  Resp: 18 17  Temp:    SpO2: 97% 96%    Last Pain:  Vitals:   03/24/24 2015  TempSrc:   PainSc: 4                   Lewie Loron

## 2024-03-24 NOTE — Transfer of Care (Signed)
 Immediate Anesthesia Transfer of Care Note  Patient: Arthur Church  Procedure(s) Performed: ANAL FISTULOTOMY; HEMORRHOID LIGATION/PEXY EXAM UNDER ANESTHESIA, RECTUM HEMORRHOIDECTOMY X2  Patient Location: PACU  Anesthesia Type:General  Level of Consciousness: awake and patient cooperative  Airway & Oxygen Therapy: Patient Spontanous Breathing and Patient connected to face mask  Post-op Assessment: Report given to RN and Post -op Vital signs reviewed and stable  Post vital signs: Reviewed and stable  Last Vitals:  Vitals Value Taken Time  BP 146/72 03/24/24 1647  Temp    Pulse 88 03/24/24 1652  Resp 22 03/24/24 1649  SpO2 89 % 03/24/24 1652  Vitals shown include unfiled device data.  Last Pain:  Vitals:   03/24/24 1412  TempSrc: Oral  PainSc:          Complications: No notable events documented.

## 2024-03-24 NOTE — Progress Notes (Signed)
   03/24/24 2117  Assess: MEWS Score  Temp 98.5 F (36.9 C)  BP (!) 154/69  MAP (mmHg) 93  Pulse Rate (!) 124  Resp 18  SpO2 97 %  O2 Device Nasal Cannula  O2 Flow Rate (L/min) 3 L/min  Assess: MEWS Score  MEWS Temp 0  MEWS Systolic 0  MEWS Pulse 2  MEWS RR 0  MEWS LOC 0  MEWS Score 2  MEWS Score Color Yellow  Assess: if the MEWS score is Yellow or Red  Were vital signs accurate and taken at a resting state? Yes  Does the patient meet 2 or more of the SIRS criteria? No  MEWS guidelines implemented  Yes, yellow  Treat  MEWS Interventions Considered administering scheduled or prn medications/treatments as ordered  Take Vital Signs  Increase Vital Sign Frequency  Yellow: Q2hr x1, continue Q4hrs until patient remains green for 12hrs  Escalate  MEWS: Escalate Yellow: Discuss with charge nurse and consider notifying provider and/or RRT  Notify: Charge Nurse/RN  Name of Charge Nurse/RN Notified Hadassah Pais RN  Provider Notification  Provider Name/Title Dr. Charmayne Sheer  Date Provider Notified 03/24/24  Time Provider Notified 2129  Method of Notification Face-to-face  Notification Reason Other (Comment) (yellow mews d/t tachy 124)  Provider response See new orders  Date of Provider Response 03/24/24  Time of Provider Response 2129  Assess: SIRS CRITERIA  SIRS Temperature  0  SIRS Respirations  0  SIRS Pulse 1  SIRS WBC 0  SIRS Score Sum  1

## 2024-03-24 NOTE — Progress Notes (Signed)
 Pharmacy Antibiotic Note  Arthur Church is a 68 y.o. male admitted on 03/24/2024 with anal fistula and s/p anal fitula repair 4/11.  Patient acute hypoxia and Pharmacy has been consulted for Unasyn dosing for r/o aspiration pneumonia.  Plan: Unasyn 3g IV q6h Need for further dosage adjustment appears unlikely at present.    Will sign off at this time.  Please reconsult if a change in clinical status warrants re-evaluation of dosage.    Height: 6\' 1"  (185.4 cm) Weight: (!) 158.8 kg (350 lb) IBW/kg (Calculated) : 79.9  Temp (24hrs), Avg:98.4 F (36.9 C), Min:98.3 F (36.8 C), Max:98.6 F (37 C)  Recent Labs  Lab 03/22/24 1421 03/24/24 2153  WBC 12.3* 13.7*  CREATININE 0.75 0.79    Estimated Creatinine Clearance: 143.2 mL/min (by C-G formula based on SCr of 0.79 mg/dL).    No Known Allergies    Thank you for allowing pharmacy to be a part of this patient's care.  Maryellen Pile, PharmD 03/24/2024 11:03 PM

## 2024-03-25 ENCOUNTER — Encounter (HOSPITAL_COMMUNITY): Payer: Self-pay | Admitting: Surgery

## 2024-03-25 DIAGNOSIS — J9621 Acute and chronic respiratory failure with hypoxia: Secondary | ICD-10-CM | POA: Diagnosis not present

## 2024-03-25 DIAGNOSIS — E119 Type 2 diabetes mellitus without complications: Secondary | ICD-10-CM | POA: Diagnosis not present

## 2024-03-25 DIAGNOSIS — I480 Paroxysmal atrial fibrillation: Secondary | ICD-10-CM | POA: Diagnosis not present

## 2024-03-25 LAB — BASIC METABOLIC PANEL WITH GFR
Anion gap: 9 (ref 5–15)
BUN: 12 mg/dL (ref 8–23)
CO2: 28 mmol/L (ref 22–32)
Calcium: 8.9 mg/dL (ref 8.9–10.3)
Chloride: 99 mmol/L (ref 98–111)
Creatinine, Ser: 0.78 mg/dL (ref 0.61–1.24)
GFR, Estimated: >60 mL/min (ref >60)
Glucose, Bld: 202 mg/dL — ABNORMAL HIGH (ref 70–99)
Potassium: 4.5 mmol/L (ref 3.5–5.1)
Sodium: 136 mmol/L (ref 135–145)

## 2024-03-25 LAB — CBC
HCT: 43.7 % (ref 39.0–52.0)
Hemoglobin: 13.5 g/dL (ref 13.0–17.0)
MCH: 28.5 pg (ref 26.0–34.0)
MCHC: 30.9 g/dL (ref 30.0–36.0)
MCV: 92.2 fL (ref 80.0–100.0)
Platelets: 262 10*3/uL (ref 150–400)
RBC: 4.74 MIL/uL (ref 4.22–5.81)
RDW: 14.5 % (ref 11.5–15.5)
WBC: 15.2 10*3/uL — ABNORMAL HIGH (ref 4.0–10.5)
nRBC: 0 % (ref 0.0–0.2)

## 2024-03-25 LAB — GLUCOSE, CAPILLARY
Glucose-Capillary: 159 mg/dL — ABNORMAL HIGH (ref 70–99)
Glucose-Capillary: 152 mg/dL — ABNORMAL HIGH (ref 70–99)
Glucose-Capillary: 178 mg/dL — ABNORMAL HIGH (ref 70–99)

## 2024-03-25 MED ORDER — INSULIN ASPART 100 UNIT/ML IJ SOLN: 0.0000 [IU] | Freq: Every day | INTRAMUSCULAR | Status: DC

## 2024-03-25 MED ORDER — APIXABAN 5 MG PO TABS
5.0000 mg | ORAL_TABLET | Freq: Two times a day (BID) | ORAL | Status: DC
  Administered 2024-03-25 – 2024-03-26 (×3): 5 mg via ORAL
  Filled 2024-03-25 (×3): qty 1

## 2024-03-25 MED ORDER — OXYCODONE HCL 5 MG PO TABS
5.0000 mg | ORAL_TABLET | Freq: Once | ORAL | Status: AC
  Administered 2024-03-25: 5 mg via ORAL
  Filled 2024-03-25: qty 1

## 2024-03-25 MED ORDER — INSULIN ASPART 100 UNIT/ML IJ SOLN
0.0000 [IU] | Freq: Three times a day (TID) | INTRAMUSCULAR | Status: DC
  Administered 2024-03-25 – 2024-03-26 (×3): 3 [IU] via SUBCUTANEOUS

## 2024-03-25 NOTE — Progress Notes (Signed)
   03/25/24 1002  Oxygen Therapy/Pulse Ox  O2 Flow Rate (L/min) (S)  3 L/min (Weaned to 2 L [place] day: pt stated that he uses 3 L @ home @ HS or with exertion.)

## 2024-03-25 NOTE — Progress Notes (Signed)
 03/25/2024  Arthur Church [id number] [birth year]  CARE TEAM: PCP: Virl Grimes, PA-C  Outpatient Care Team: Patient Care Team: Hermenia Loose as PCP - General (Physician Assistant) Lei Pump, MD as PCP - Electrophysiology (Cardiology) Maximo Spar Aviva Lemmings, MD as PCP - Cardiology (Cardiology) Jenni Mody, NP (Inactive) as Nurse Practitioner (Pulmonary Disease) Mannam, Praveen, MD as Consulting Physician (Pulmonary Disease) Candyce Champagne, MD as Consulting Physician (General Surgery) Sergio Dandy, MD as Consulting Physician (Gastroenterology)  Inpatient Treatment Team: Treatment Team:  Candyce Champagne, MD Bennie Brave, MD Lexine Redder, MD Mahat, Verlyn Goad, RN Munda, Arnel, NT Kathaleen Pale, RN Sydna Evangelist, [place]   Problem List:   Principal Problem:   Hypoxia Active Problems:   Chronic heart failure with preserved ejection fraction (HFpEF) (HCC)   PAF (paroxysmal atrial fibrillation) (HCC)   Non-ischemic cardiomyopathy (HCC)   Periodic limb movement sleep disorder   Diabetes mellitus type 2, diet-controlled (HCC)   COPD with chronic bronchitis and emphysema (HCC)   Chronic anticoagulation   Morbid obesity with BMI of 45.0-49.9, adult (HCC)   Acute respiratory failure with hypoxia (HCC)   03/24/2024  POST-OPERATIVE DIAGNOSIS:   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  Patient had evidence of thickening consistent with an old anal fissure.  Possible chronic exacerbation.  No increased sphincter tone argues against major acute fissure.    External location RIGHT MIDLINE   about 3.5 cm from anal verge.   Internal location : Posterior midline anal canal about 1 cm from anal verge.   Large right lateral grade 3 hemorrhoid irritated.  Ligation pexing resection done.  Left lateral hemorrhoid at least grade 2 rather inflamed.  Ligation pexy resection done.  Left anterior grade 2 hemorrhoid irritated.  Ligation pexy only done.  Assessment [facility] Stay = 1 days) 1 Day Post-Op    OK    Plan:  -Pain control with sitz bath's Tylenol around-the-clock.  I wrote for some extra oxycodone since he is not asking for it.  Patient would like to sit on a donut.  Not a huge fan of that but he is pretty mobile hopefully will sit on it is okay.  Hyperglycemia with glucose above 200 raises concern of true diabetes and not just prediabetes.  Defer to medicine.  Chronic pulmonary issues suspicious for COPD with recent exacerbation.  Stabilizing on oxygen.  Diuresis and inhalers as needed.    -monitor electrolytes & replace as needed  Keep K>4, Mg>2, Phos>3  -VTE prophylaxis- SCDs.  Anticoagulation prophyllaxis SQ as appropriate  -mobilize as tolerated to help recovery.  Enlist therapies in moderate/high risk patients as appropriate  I updated the patient's status to the patient  Recommendations were made.  Questions were answered.  He expressed understanding & appreciation.  -Disposition:  Disposition:  The patient is from: Home Anticipate discharge to:  Home Anticipated Date of Discharge is:  April 13,2025   Barriers to discharge:  Pending Clinical improvement (more likely than not) and Consultant clearance & sign off    Patient currently is NOT MEDICALLY STABLE for discharge from the hospital from a surgery standpoint.  I reviewed nursing notes, hospitalist notes, last 24 h vitals and pain scores, last 48 h intake and output, last 24 h labs and trends, and last 24 h imaging results.  I have reviewed this patient's  available data, including medical history, events of note, test results, etc as part of my evaluation.   A significant portion of that time was spent in counseling. Care during the described time interval was provided by me.  This care required moderate level of medical decision making.  03/25/2024    Subjective: (Chief complaint)  Patient seen up in chair.  Only taking Tylenol.  Feeling sore.  No nausea or vomiting.  Satting 97% on 3 L oxygen.  Hoping to go home soon.  Objective:  Vital signs:  Vitals:   03/24/24 2318 03/24/24 2320 03/25/24 0301 03/25/24 0645  BP:  121/74 (!) 105/50 (!) 105/55  Pulse:  97 77 77  Resp:   16 17  Temp:   98.7 F (37.1 C) 98.7 F (37.1 C)  TempSrc:   Oral Oral  SpO2: 95% 94% 98% 98%  Weight:      Height:        Last BM Date : 03/23/24  Intake/Output   Yesterday:  04/11 0701 - 04/12 0700 In: 760 [P.O.:360; I.V.:200; IV Piggyback:200] Out: 700 [Urine:650; Blood:50] This shift:  Total I/O In: 120 [P.O.:120] Out: -   Bowel function:  Flatus: YES  BM:  No  Drain: (No drain)   Physical Exam:  General: Pt awake/alert in no acute distress Eyes: PERRL, normal EOM.  Sclera clear.  No icterus Neuro: CN II-XII intact w/o focal sensory/motor deficits. Lymph: No head/neck/groin lymphadenopathy Psych:  No delerium/psychosis/paranoia.  Oriented x 4 HENT: Normocephalic, Mucus membranes moist.  No thrush Neck: Supple, No tracheal deviation.  No obvious thyromegaly Chest: No pain to chest wall compression.  Good respiratory excursion.  No audible wheezing CV:  Pulses intact.  Regular rhythm.  No major extremity edema MS: Normal AROM mjr joints.  No obvious deformity Abdomen: Soft.  Nondistended.  Nontender.  No evidence of peritonitis.  No incarcerated hernias.  GI/rectal: No major bleeding.  Ext:  No deformity.  No mjr edema.  No cyanosis Skin: No petechiae / purpurea.  No major sores.  Warm and dry    Results:    Cultures: No results found for this or any previous visit (from the past 720 hours).  Labs: Results for orders placed or performed during the hospital encounter of 03/24/24 (from the past 48 hours)  CBC     Status: Abnormal   Collection Time: 03/24/24  9:53 PM  Result Value Ref Range   WBC 13.7 (H) 4.0 - 10.5 K/uL   RBC 5.15 4.22 - 5.81 MIL/uL   Hemoglobin 14.6 13.0 - 17.0 g/dL   HCT 16.1 09.6 - 04.5 %   MCV 92.4 80.0 - 100.0 fL   MCH 28.3 26.0 - 34.0 pg   MCHC 30.7 30.0 - 36.0 g/dL   RDW 40.9 81.1 - 91.4 %   Platelets 256 150 - 400 K/uL   nRBC 0.0 0.0 - 0.2 %    Comment: Performed at [facility], [address], [place], [place] [address]  Creatinine, serum     Status: None   Collection Time: 03/24/24  9:53 PM  Result Value Ref Range   Creatinine, Ser 0.79 0.61 - 1.24 mg/dL   GFR, Estimated >62 >13 mL/min    Comment: (NOTE) Calculated using the CKD-EPI Creatinine  Equation (2021) Performed at [facility] [address], [place], [place] [address]   CBC     Status: Abnormal   Collection Time: 03/25/24  5:29 AM  Result Value Ref Range   WBC 15.2 (H) 4.0 - 10.5 K/uL   RBC 4.74 4.22 - 5.81 MIL/uL   Hemoglobin 13.5 13.0 - 17.0 g/dL   HCT 60.4 54.0 - 98.1 %   MCV 92.2 80.0 - 100.0 fL   MCH 28.5 26.0 - 34.0 pg   MCHC 30.9 30.0 - 36.0 g/dL   RDW 19.1 47.8 - 29.5 %   Platelets 262 150 - 400 K/uL   nRBC 0.0 0.0 - 0.2 %    Comment: Performed at [facility], [address], [place], [place] [address]  Basic metabolic panel with GFR     Status: Abnormal   Collection Time: 03/25/24  5:29 AM  Result Value Ref Range   Sodium 136 135 - 145 mmol/L   Potassium 4.5 3.5 - 5.1 mmol/L   Chloride 99 98 - 111 mmol/L   CO2 28 22 - 32 mmol/L   Glucose, Bld 202 (H) 70 - 99 mg/dL    Comment: Glucose reference range applies only to samples taken after fasting for at least 8 hours.   BUN 12 8 - 23 mg/dL   Creatinine, Ser 8.65 0.61  - 1.24 mg/dL   Calcium 8.9 8.9 - 78.4 mg/dL   GFR, Estimated >69 >62 mL/min    Comment: (NOTE) Calculated using the CKD-EPI Creatinine Equation (2021)    Anion gap 9 5 - 15    Comment: Performed at [facility], [address] [place], [place] [address]    Imaging / Studies: X-ray chest PA or AP Result Date: 03/24/2024 CLINICAL DATA:  Shortness of breath EXAM: CHEST  1 VIEW COMPARISON:  04/18/2023 FINDINGS: Prior CABG. Cardiomegaly. Elevation of the left hemidiaphragm. Mild vascular congestion. Bibasilar opacities, likely atelectasis. No acute bony abnormality. IMPRESSION: Cardiomegaly with vascular congestion.  Bibasilar atelectasis. Electronically Signed   By: Janeece Mechanic M.D.   On: 03/24/2024 19:56    Medications / Allergies: per chart  Antibiotics: Anti-infectives (From admission, onward)    Start     Dose/Rate Route Frequency Ordered Stop   03/24/24 2300  Ampicillin-Sulbactam (UNASYN) 3 g in sodium chloride 0.9 % 100 mL IVPB  Status:  Discontinued        3 g 200 mL/hr over 30 Minutes Intravenous Every 6 hours 03/24/24 2203 03/24/24 2221   03/24/24 2300  Ampicillin-Sulbactam (UNASYN) 3 g in sodium chloride 0.9 % 100 mL IVPB        3 g 200 mL/hr over 30 Minutes Intravenous Every 6 hours 03/24/24 2228     03/24/24 1330  cefTRIAXone (ROCEPHIN) 2 g in sodium chloride 0.9 % 100 mL IVPB        2 g 200 mL/hr over 30 Minutes Intravenous On call to O.R. 03/24/24 1323 03/24/24 1523         Note: Portions of this report may have been transcribed using voice recognition software. Every effort was made to ensure accuracy; however, inadvertent computerized transcription errors may be present.   Any transcriptional errors that result from this process are unintentional.    Eddye Goodie, MD, FACS, MASCRS Esophageal, Gastrointestinal & Colorectal Surgery Robotic and Minimally Invasive Surgery  [facility] [address] [place], [place] [address] [phone] Fax [phone] Main  CONTACT INFORMATION: Weekday (9AM-5PM): Call [facility] main office at [phone] Weeknight (5PM-9AM) or Weekend/Holiday: Check EPIC "Web Links" tab & use "AMION" (password " [email or web address]") for General Surgery [facility] coverage  Please, DO NOT use SecureChat  (it is not reliable communication to reach operating surgeons & will lead to a delay in care).   Epic staff messaging available for outptient concerns needing 1-2 business day response.      03/25/2024  9:58 AM

## 2024-03-25 NOTE — Progress Notes (Signed)
 Hospitalist Consult Note   Arthur Church  QIH:[id number]  DOB: 07/07/1956  DOA: 03/24/2024  PCP: Arthur Church  Requesting provider: Dr. Hershell Church  Reason for consultation: hypoxia    History of Present Illness: Mr. Arthur Church is a 68 yo male with PMH chronic sCHF, suspected OSA, chronic hypoxia (on nocturnal O2), HTN, CAD, obesity, afib, DMII. He presented on 4/11 to general surgery for evaluation for a possible perirectal abscess/fistula. 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   After the procedure he was noted to be hypoxic and hospitalist consulted for further evaluation.  Of note, at home in the mornings he also states that his oxygen is low despite sleeping with oxygen at night.  He states he usually wears oxygen in the morning for a little bit along with deep breathing exercises then is able to come off of oxygen around 10 AM in the morning typically.  CXR was obtained after surgery which showed some possible vascular congestion and bibasilar atelectasis.  He was afebrile and had some mild leukocytosis.  He was started on Unasyn for possible aspiration along with Lasix.   Interval History: No events overnight.  Still on oxygen this morning and breathing comfortably.  He was using incentive spirometer for me and can easily pull in over 1500 cc.  Encouraged him to continue using.  Strongest suspicion is for probable atelectasis sustained during procedure, especially with his body habitus.  Assessment and Plan: Acute on chronic respiratory failure with hypoxia (HCC) - Suspected OSA but also known COPD per patient.  Also with his body habitus, at risk for atelectasis.  He describes lingering hypoxia in the morning despite wearing nocturnal oxygen which also supports some probable atelectasis while sleeping -Lower suspicion for  aspiration and will d/c abx at this time; leukocytosis likely reactive esp with increase despite abx - monitor off abx - continue encouraging incentive spirometer and ambulating -Lasix okay for now -He theoretically would be okay for discharging home and further weaning of oxygen since has O2 already at home and he describes similar issues at home in the morning with lingering hypoxia   PAF (paroxysmal atrial fibrillation) (HCC) - On Eliquis and Coreg at home -Continue Coreg - Some mild streaking of blood noted on his chair - eliquis resumed 4/12 per surgery   Diabetes mellitus type 2, diet-controlled (HCC) - last A1c 6.9 % on 04/19/23 - was given decadron in the OR likely explaining lingering hyperglycemia and should settle back out - okay for just SSI for now - new A1c pending, will follow up   Past Medical History: Past Medical History:  Diagnosis Date   Anxiety    Chronic systolic CHF (congestive heart failure) (HCC)    a. 2D ECHO (03/06/15) EF 20-25%, diffuse HK. Asc aortic diameter: 40mm. Mild LA dilation, mild RV dilation. Mild RV systolic dysfunction   b. LifeVest placed on 02/2015 admissoin    Coronary artery disease    a. 70% LAD lesion   Dysrhythmia    Elevated TSH    ETOH abuse    Hypertension    Morbid obesity (HCC)    a. BMI ~46   Persistent atrial fibrillation (HCC)    a. newly dx 02/2015 admission. s/p successful TEE/DCCV  b. on Eliquis   Pre-diabetes    a. HgA1c  6.1    Past Surgical History: Past Surgical History:  Procedure Laterality Date   ANAL FISTULOTOMY N/A 03/24/2024   Procedure: ANAL FISTULA REPAIR - LIFT (Ligation of Intersphincteric fistula tract);  Surgeon: Candyce Champagne, MD;  Location: [facility];  Service: General;  Laterality: N/A;  REPAIR OF PERIRECTAL, POSSIBLE HEMORRHOIDECTOMY   ATRIAL FIBRILLATION ABLATION N/A 01/28/2023   Procedure: ATRIAL FIBRILLATION ABLATION;  Surgeon: Lei Pump, MD;  Location: [facility] INVASIVE CV LAB;  Service:  Cardiovascular;  Laterality: N/A;   CARDIAC CATHETERIZATION N/A 04/26/2015   Procedure: Left Heart Cath and Coronary Angiography;  Surgeon: Arty Binning, MD;  [facility] INVASIVE CV LAB;  Service: Cardiovascular;  Laterality: N/A;   CARDIOVERSION N/A 03/06/2015   Procedure: CARDIOVERSION;  Surgeon: Darlis Eisenmenger, MD;  Location: [facility] ENDOSCOPY;  Service: Cardiovascular;  Laterality: N/A;   CARDIOVERSION N/A 06/02/2022   Procedure: CARDIOVERSION;  Surgeon: Lake Pilgrim, MD;  Location: [facility] ENDOSCOPY;  Service: Cardiovascular;  Laterality: N/A;   CARDIOVERSION N/A 07/14/2022   Procedure: CARDIOVERSION;  Surgeon: Luana Rumple, MD;  Location: [facility] ENDOSCOPY;  Service: Cardiovascular;  Laterality: N/A;   CATARACT EXTRACTION, BILATERAL Bilateral    COLONOSCOPY WITH PROPOFOL N/A 02/24/2024   Procedure: COLONOSCOPY WITH PROPOFOL;  Surgeon: Sergio Dandy, MD;  Location: [facility] ENDOSCOPY;  Service: Gastroenterology;  Laterality: N/A;   CORONARY ANGIOGRAPHY N/A 03/18/2022   Procedure: CORONARY ANGIOGRAPHY;  Surgeon: Odie Benne, MD;  Location: [facility] INVASIVE CV LAB;  Service: Cardiovascular;  Laterality: N/A;   CORONARY ARTERY BYPASS GRAFT N/A 03/24/2022   Procedure: CORONARY ARTERY BYPASS GRAFTING (CABG) TIMES TWO USING LEFT INTERNAL MAMMARY ARTERY AND ENDOSCOPICALLY HARVESTED RIGHT GREATER SAPHENOUS VEIN;  Surgeon: Shon Downing, MD;  Location: [facility] OR;  Service: Open Heart Surgery;  Laterality: N/A;   ENDOSCOPIC MUCOSAL RESECTION  02/24/2024   Procedure: RESECTION, MUCOSAL LESION, GI TRACT, ENDOSCOPIC;  Surgeon: Sergio Dandy, MD;  Location: [facility] ENDOS[facility];  Service: Gastroenterology;;   ENDOVEIN HARVEST OF GREATER SAPHENOUS VEIN Right 03/24/2022   Procedure: ENDOVEIN HARVEST OF GREATER SAPHENOUS VEIN;  Surgeon: Shon Downing, MD;  Location: [facility] OR;  Service: Open Heart Surgery;  Laterality: Right;   HEMORRHOID SURGERY  03/24/2024   Procedure: HEMORRHOIDECTOMY X2;  Surgeon: Candyce Champagne, MD;  Location: [facility];  Service: General;;   HERNIA REPAIR     LEFT HEART CATH N/A 03/16/2022   Procedure: Left Heart Cath;  Surgeon: Pasqual Bone, MD;  Location: [facility] INVAS[facility] CV LAB;  Service: Cardiovascular;  Laterality: N/A;   POLYPECTOMY  02/24/2024   Procedure: POLYPECTOMY;  Surgeon: Sergio Dandy, MD;  Location: [facility] ENDOSCOPY;  Service: Gastroenterology;;   RECTAL EXAM UNDER ANESTHESIA N/A 03/24/2024   Procedure: HEMORRHOIDAL LIGATION & HEMORRHOIDOPEXY ANORECTAL EXAMINATION UNDER ANESTHESIA;  Surgeon: Candyce Champagne, MD;  Location: [facility];  Service: General;  Laterality: N/A;   TEE WITHOUT CARDIOVERSION N/A 03/06/2015   Procedure: TRANSESOPHAGEAL ECHOCARDIOGRAM (TEE);  Surgeon: Darlis Eisenmenger, MD;  Location: [facility] ENDOSCOPY;  Service: Cardiovascular;  Laterality: N/A;   TEE WITHOUT CARDIOVERSION N/A 05/02/2015   Procedure: TRANSESOPHAGEAL ECHOCARDIOGRAM (TEE);  Surgeon: Lenise Quince, MD;  Location: [facility] ENDOSCOPY;  Service: Cardiovascular;  Laterality: N/A;   TEE WITHOUT CARDIOVERSION N/A 03/24/2022   Procedure: TRANSESOPHAGEAL ECHOCARDIOGRAM (TEE);  Surgeon: Shon Downing, MD;  Location: [facility] OR;  Service: Open Heart Surgery;  Laterality: N/A;    Allergies:  No Known Allergies  Social History:  reports that he quit smoking about 11 years ago. His smoking use included cigarettes.  He has never used smokeless tobacco. He reports current alcohol use. He reports that he does not use drugs.  Family History: Family History  Problem Relation Age of Onset   Heart attack Mother    Breast cancer Mother    Uterine cancer Sister    Sleep apnea Brother    Sleep apnea Brother    Heart attack Maternal Grandfather    Colon cancer Neg Hx    Liver cancer Neg Hx    Stomach cancer Neg Hx    Esophageal cancer Neg Hx    Objective: Physical Exam: Vitals:   03/25/24 0301 03/25/24 0645 03/25/24 1002 03/25/24 1045  BP: (!) 105/50 (!) 105/55  (!) 110/54  Pulse: 77 77  69  Resp: 16 17   18   Temp: 98.7 F (37.1 C) 98.7 F (37.1 C)  (!) 97.5 F (36.4 C)  TempSrc: Oral Oral  Oral  SpO2: 98% 98% 99% 100%  Weight:      Height:       Physical Exam Constitutional:      Appearance: Normal appearance.  HENT:     Head: Normocephalic and atraumatic.     Mouth/Throat:     Mouth: Mucous membranes are moist.  Eyes:     Extraocular Movements: Extraocular movements intact.  Cardiovascular:     Rate and Rhythm: Normal rate and regular rhythm.  Pulmonary:     Effort: Pulmonary effort is normal.     Breath sounds: Normal breath sounds.  Abdominal:     General: Bowel sounds are normal. There is no distension.     Palpations: Abdomen is soft.     Tenderness: There is no abdominal tenderness.  Musculoskeletal:        General: Normal range of motion.     Cervical back: Normal range of motion and neck supple.  Skin:    General: Skin is warm and dry.  Neurological:     General: No focal deficit present.     Mental Status: He is alert.  Psychiatric:        Mood and Affect: Mood normal.     Data reviewed:  I have personally reviewed following labs and imaging studies Results for orders placed or performed during the hospital encounter of 03/24/24 (from the past 48 hours)  CBC     Status: Abnormal   Collection Time: 03/24/24  9:53 PM  Result Value Ref Range   WBC 13.7 (H) 4.0 - 10.5 K/uL   RBC 5.15 4.22 - 5.81 MIL/uL   Hemoglobin 14.6 13.0 - 17.0 g/dL   HCT 30.8 65.7 - 84.6 %   MCV 92.4 80.0 - 100.0 fL   MCH 28.3 26.0 - 34.0 pg   MCHC 30.7 30.0 - 36.0 g/dL   RDW 96.2 95.2 - 84.1 %   Platelets 256 150 - 400 K/uL   nRBC 0.0 0.0 - 0.2 %    Comment: Performed at [facility], [address], [place], [place] [address]  Creatinine, serum     Status: None   Collection Time: 03/24/24  9:53 PM  Result Value Ref Range   Creatinine, Ser 0.79 0.61 - 1.24 mg/dL   GFR, Estimated >10 >27 mL/min    Comment: (NOTE) Calculated using the CKD-EPI Creatinine  Equation (2021) Performed at [facility], [address], [place], [place] [address]   CBC     Status: Abnormal   Collection Time: 03/25/24  5:29 AM  Result Value Ref Range  WBC 15.2 (H) 4.0 - 10.5 K/uL   RBC 4.74 4.22 - 5.81 MIL/uL   Hemoglobin 13.5 13.0 - 17.0 g/dL   HCT 82.9 56.2 - 13.0 %   MCV 92.2 80.0 - 100.0 fL   MCH 28.5 26.0 - 34.0 pg   MCHC 30.9 30.0 - 36.0 g/dL   RDW 86.5 78.4 - 69.6 %   Platelets 262 150 - 400 K/uL   nRBC 0.0 0.0 - 0.2 %    Comment: Performed at [facility], [address] [place], [place] [address]  Basic metabolic panel with GFR     Status: Abnormal   Collection Time: 03/25/24  5:29 AM  Result Value Ref Range   Sodium 136 135 - 145 mmol/L   Potassium 4.5 3.5 - 5.1 mmol/L   Chloride 99 98 - 111 mmol/L   CO2 28 22 - 32 mmol/L   Glucose, Bld 202 (H) 70 - 99 mg/dL    Comment: Glucose reference range applies only to samples taken after fasting for at least 8 hours.   BUN 12 8 - 23 mg/dL   Creatinine, Ser 4.13 0.61 - 1.24 mg/dL   Calcium 8.9 8.9 - 24.4 mg/dL   GFR, Estimated >01 >02 mL/min    Comment: (NOTE) Calculated using the CKD-EPI Creatinine Equation (2021)    Anion gap 9 5 - 15    Comment: Performed at [facility], [address] [place], [place] [address]  Glucose, capillary     Status: Abnormal   Collection Time: 03/25/24 11:34 AM  Result Value Ref Range   Glucose-Capillary 159 (H) 70 - 99 mg/dL    Comment: Glucose reference range applies only to samples taken after fasting for at least 8 hours.    Labs:  CBC: Recent Labs  Lab 03/22/24 1421 03/24/24 2153 03/25/24 0529  WBC 12.3* 13.7* 15.2*  HGB 14.0 14.6 13.5  HCT 45.6 47.6 43.7  MCV 91.4 92.4 92.2  PLT 271 256 262    Basic Metabolic Panel: Recent Labs  Lab 03/22/24 1421 03/24/24 2153 03/25/24 0529  NA 135  --  136  K 4.3  --  4.5  CL 99  --  99  CO2 30  --  28  GLUCOSE 138*  --  202*  BUN 19  --  12   CREATININE 0.75 0.79 0.78  CALCIUM 9.3  --  8.9   GFR Estimated Creatinine Clearance: 143.2 mL/min (by C-G formula based on SCr of 0.78 mg/dL). Liver Function Tests: No results for input(s): "AST", "ALT", "ALKPHOS", "BILITOT", "PROT", "ALBUMIN" in the last 168 hours. No results for input(s): "LIPASE", "AMYLASE" in the last 168 hours. No results for input(s): "AMMONIA" in the last 168 hours. Coagulation profile No results for input(s): "INR", "PROTIME" in the last 168 hours.  Cardiac Enzymes: No results for input(s): "CKTOTAL", "CKMB", "CKMBINDEX", "TROPONINI" in the last 168 hours. BNP: Invalid input(s): "POCBNP" CBG: Recent Labs  Lab 03/25/24 1134  GLUCAP 159*   D-Dimer No results for input(s): "DDIMER" in the last 72 hours. Hgb A1c No results for input(s): "HGBA1C" in the last 72 hours. Lipid Profile No results for input(s): "CHOL", "HDL", "LDLCALC", "TRIG", "CHOLHDL", "LDLDIRECT" in the last 72 hours. Thyroid function studies No results for input(s): "TSH", "T4TOTAL", "T3FREE", "THYROIDAB" in the last 72 hours.  Invalid input(s): "FREET3" Anemia work up No results for input(s): "VITAMINB12", "FOLATE", "FERRITIN", "TIBC", "IRON", "RETICCTPCT" in the last 72 hours. Urinalysis    Component Value Date/Time  COLORURINE STRAW (A) 03/20/2022 0308   APPEARANCEUR CLEAR 03/20/2022 0308   LABSPEC 1.004 (L) 03/20/2022 0308   PHURINE 6.0 03/20/2022 0308   GLUCOSEU NEGATIVE 03/20/2022 0308   HGBUR NEGATIVE 03/20/2022 0308   BILIRUBINUR NEGATIVE 03/20/2022 0308   KETONESUR NEGATIVE 03/20/2022 0308   PROTEINUR NEGATIVE 03/20/2022 0308   NITRITE NEGATIVE 03/20/2022 0308   LEUKOCYTESUR NEGATIVE 03/20/2022 0308    Microbiology No results found for this or any previous visit (from the past 240 hours).  Inpatient Medications:   Scheduled Meds:  acetaminophen  1,000 mg Oral Q6H   apixaban  5 mg Oral BID   atorvastatin  80 mg Oral QHS   budeson-glycopyrrolate-formoterol  2  puff Inhalation BID   carvedilol  6.25 mg Oral BID WC   furosemide  40 mg Oral BID   insulin aspart  0-15 Units Subcutaneous TID WC   insulin aspart  0-5 Units Subcutaneous QHS   loratadine  10 mg Oral Daily   magnesium oxide  400 mg Oral BID   montelukast  10 mg Oral QHS   pantoprazole  40 mg Oral Daily   polyethylene glycol  17 g Oral BID   sacubitril-valsartan  1 tablet Oral BID   sodium chloride flush  3 mL Intravenous Q12H   spironolactone  12.5 mg Oral QODAY   PRN Meds: sodium chloride, ALPRAZolam, diphenhydrAMINE **OR** diphenhydrAMINE, HYDROmorphone (DILAUDID) injection, lactated ringers, magic mouthwash, menthol-cetylpyridinium, methocarbamol (ROBAXIN) injection, methocarbamol, metoprolol tartrate, naphazoline-glycerin, ondansetron **OR** ondansetron (ZOFRAN) IV, oxyCODONE, phenol, prochlorperazine **OR** prochlorperazine, simethicone, sodium chloride, sodium chloride flush Continuous Infusions:  sodium chloride     lactated ringers      Radiological Exams on Admission: X-ray chest PA or AP Result Date: 03/24/2024 CLINICAL DATA:  Shortness of breath EXAM: CHEST  1 VIEW COMPARISON:  04/18/2023 FINDINGS: Prior CABG. Cardiomegaly. Elevation of the left hemidiaphragm. Mild vascular congestion. Bibasilar opacities, likely atelectasis. No acute bony abnormality. IMPRESSION: Cardiomegaly with vascular congestion.  Bibasilar atelectasis. Electronically Signed   By: Janeece Mechanic M.D.   On: 03/24/2024 19:56    Thank you for this consultation. [facility] hospitalist team will follow the patient with you.   Time spent: Greater than 50% of the 55 minute visit was spent in counseling/coordination of care for the patient as laid out in the A&P.  Faith Homes M.D. [facility] 03/25/2024, 12:37 PM Pager: Secure chat

## 2024-03-25 NOTE — Assessment & Plan Note (Addendum)
-   last A1c 6.9 % on 04/19/23 - was given decadron in the OR likely explaining lingering hyperglycemia and should settle back out - okay for just SSI for now - new A1c pending, will follow up

## 2024-03-25 NOTE — Progress Notes (Signed)
   03/25/24 0815  TOC Brief Assessment  Insurance and Status Reviewed  Patient has primary care physician Yes  Home environment has been reviewed home alone  Prior level of function: independent  Prior/Current Home Services No current home services  Social Drivers of Health Review SDOH reviewed no interventions necessary  Readmission risk has been reviewed Yes  Transition of care needs no transition of care needs at this time

## 2024-03-25 NOTE — Progress Notes (Signed)
 Patient's 02 saturation is 83% (without 02 inhalation )while ambulating and 97 % with 02.

## 2024-03-26 DIAGNOSIS — J9621 Acute and chronic respiratory failure with hypoxia: Secondary | ICD-10-CM | POA: Diagnosis not present

## 2024-03-26 LAB — GLUCOSE, CAPILLARY
Glucose-Capillary: 113 mg/dL — ABNORMAL HIGH (ref 70–99)
Glucose-Capillary: 165 mg/dL — ABNORMAL HIGH (ref 70–99)

## 2024-03-26 LAB — BASIC METABOLIC PANEL WITH GFR
Anion gap: 9 (ref 5–15)
BUN: 14 mg/dL (ref 8–23)
CO2: 31 mmol/L (ref 22–32)
Calcium: 9.5 mg/dL (ref 8.9–10.3)
Chloride: 97 mmol/L — ABNORMAL LOW (ref 98–111)
Creatinine, Ser: 0.73 mg/dL (ref 0.61–1.24)
GFR, Estimated: >60 mL/min (ref >60)
Glucose, Bld: 132 mg/dL — ABNORMAL HIGH (ref 70–99)
Potassium: 4.3 mmol/L (ref 3.5–5.1)
Sodium: 137 mmol/L (ref 135–145)

## 2024-03-26 LAB — CBC
HCT: 45.7 % (ref 39.0–52.0)
Hemoglobin: 14.2 g/dL (ref 13.0–17.0)
MCH: 28.3 pg (ref 26.0–34.0)
MCHC: 31.1 g/dL (ref 30.0–36.0)
MCV: 91.2 fL (ref 80.0–100.0)
Platelets: 273 10*3/uL (ref 150–400)
RBC: 5.01 MIL/uL (ref 4.22–5.81)
RDW: 14.6 % (ref 11.5–15.5)
WBC: 12.7 10*3/uL — ABNORMAL HIGH (ref 4.0–10.5)
nRBC: 0 % (ref 0.0–0.2)

## 2024-03-26 LAB — BRAIN NATRIURETIC PEPTIDE: B Natriuretic Peptide: 192.9 pg/mL — ABNORMAL HIGH (ref 0.0–100.0)

## 2024-03-26 MED ORDER — FUROSEMIDE 10 MG/ML IJ SOLN
20.0000 mg | Freq: Once | INTRAMUSCULAR | Status: AC
  Administered 2024-03-26: 20 mg via INTRAVENOUS
  Filled 2024-03-26: qty 2

## 2024-03-26 NOTE — Progress Notes (Signed)
 Hospitalist Consult Note   Arthur Church  WUJ:[id number]  DOB: May 09, 1956  DOA: 03/24/2024  PCP: Hermenia Loose  Requesting provider: Dr. Hershell Lose  Reason for consultation: hypoxia    History of Present Illness: Arthur Church is a 68 yo male with PMH chronic sCHF, suspected OSA, chronic hypoxia (on nocturnal O2), HTN, CAD, obesity, afib, DMII. He presented on 4/11 to general surgery for evaluation for a possible perirectal abscess/fistula. 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   After the procedure he was noted to be hypoxic and hospitalist consulted for further evaluation.  Of note, at home in the mornings he also states that his oxygen is low despite sleeping with oxygen at night.  He states he usually wears oxygen in the morning for a little bit along with deep breathing exercises then is able to come off of oxygen around 10 AM in the morning typically.  CXR was obtained after surgery which showed some possible vascular congestion and bibasilar atelectasis.  He was afebrile and had some mild leukocytosis.  He was started on Unasyn for possible aspiration along with Lasix.   Interval History: No events overnight.  Still having desaturations off oxygen even at rest.  Incentive spirometer use still adequate and pulling good volumes.  Getting extra dose Lasix this morning.  He is comfortable with continuing to wean oxygen at home.  Does not have any significant cough different from his baseline and certainly no sputum production.  Assessment and Plan: Acute on chronic respiratory failure with hypoxia (HCC) - Suspected OSA but also known COPD per patient.  Also with his body habitus, at risk for atelectasis.  He describes lingering hypoxia in the morning despite wearing nocturnal oxygen which also supports some probable atelectasis  while sleeping -Lower suspicion for aspiration and will d/c abx at this time; leukocytosis likely reactive esp with increase despite abx - monitor off abx - continue encouraging incentive spirometer and ambulating -Lasix okay for now; extra dose given on 4/13 to help with any residual overload; still some edema in legs - okay from IM standpoint to d/c home; he can continue weaning O2 at home; do not see any other acute need to remain in hospital or need for further workup  PAF (paroxysmal atrial fibrillation) (HCC) - On Eliquis and Coreg at home -Continue Coreg - Some mild streaking of blood noted on his chair - eliquis resumed 4/12 per surgery   Diabetes mellitus type 2, diet-controlled (HCC) - last A1c 6.9 % on 04/19/23 - was given decadron in the OR likely explaining lingering hyperglycemia and should settle back out - okay for just SSI for now - new A1c pending at time of d/c still   Past Medical History: Past Medical History:  Diagnosis Date   Anxiety    Chronic systolic CHF (congestive heart failure) (HCC)    a. 2D ECHO (03/06/15) EF 20-25%, diffuse HK. Asc aortic diameter: 40mm. Mild LA dilation, mild RV dilation. Mild RV systolic dysfunction   b. LifeVest placed on 02/2015 admissoin    Coronary artery disease    a. 70% LAD lesion   Dysrhythmia    Elevated TSH    ETOH abuse    Hypertension    Morbid obesity (HCC)    a. BMI ~46   Persistent atrial fibrillation (HCC)  a. newly dx 02/2015 admission. s/p successful TEE/DCCV  b. on Eliquis   Pre-diabetes    a. HgA1c 6.1    Past Surgical History: Past Surgical History:  Procedure Laterality Date   ANAL FISTULOTOMY N/A 03/24/2024   Procedure: ANAL FISTULA REPAIR - LIFT (Ligation of Intersphincteric fistula tract);  Surgeon: Candyce Champagne, MD;  Location: [facility];  Service: General;  Laterality: N/A;  REPAIR OF PERIRECTAL, POSSIBLE HEMORRHOIDECTOMY   ATRIAL FIBRILLATION ABLATION N/A 01/28/2023   Procedure: ATRIAL FIBRILLATION  ABLATION;  Surgeon: Lei Pump, MD;  Location: [facility] INVASIVE CV LAB;  Service: Cardiovascular;  Laterality: N/A;   CARDIAC CATHETERIZATION N/A 04/26/2015   Procedure: Left Heart Cath and Coronary Angiography;  Surgeon: Arty Binning, MD;  Location: [facility] INVASIVE CV LAB;  Service: Cardiovascular;  Laterality: N/A;   CARDIOVERSION N/A 03/06/2015   Procedure: CARDIOVERSION;  Surgeon: Darlis Eisenmenger, MD;  Location: [facility] ENDOSCOPY;  Service: Cardiovascular;  Laterality: N/A;   CARDIOVERSION N/A 06/02/2022   Procedure: CARDIOVERSION;  Surgeon: Lake Pilgrim, MD;  Location: [facility] ENDOSCOPY;  Service: Cardiovascular;  Laterality: N/A;   CARDIOVERSION N/A 07/14/2022   Procedure: CARDIOVERSION;  Surgeon: Luana Rumple, MD;  Location: [facility] ENDOSCOPY;  Service: Cardiovascular;  Laterality: N/A;   CATARACT EXTRACTION, BILATERAL Bilateral    COLONOSCOPY WITH PROPOFOL N/A 02/24/2024   Procedure: COLONOSCOPY WITH PROPOFOL;  Surgeon: Sergio Dandy, MD;  Location: [facility] ENDOSCOPY;  Service: Gastroenterology;  Laterality: N/A;   CORONARY ANGIOGRAPHY N/A 03/18/2022   Procedure: CORONARY ANGIOGRAPHY;  Surgeon: Odie Benne, MD;  Location: [facility] INVASIVE CV LAB;  Service: Cardiovascular;  Laterality: N/A;   CORONARY ARTERY BYPASS GRAFT N/A 03/24/2022   Procedure: CORONARY ARTERY BYPASS GRAFTING (CABG) TIMES TWO USING LEFT INTERNAL MAMMARY ARTERY AND ENDOSCOPICALLY HARVESTED RIGHT GREATER SAPHENOUS VEIN;  Surgeon: Shon Downing, MD;  Location: [facility] OR;  Service: Open Heart Surgery;  Laterality: N/A;   ENDOSCOPIC MUCOSAL RESECTION  02/24/2024   Procedure: RESECTION, MUCOSAL LESION, GI TRACT, ENDOSCOPIC;  Surgeon: Sergio Dandy, MD;  Location: [facility] ENDOSCOPY;  Service: Gastroenterology;;   ENDOVEIN HARVEST OF GREATER SAPHENOUS VEIN Right 03/24/2022   Procedure: ENDOVEIN HARVEST OF GREATER SAPHENOUS VEIN;  Surgeon: Shon Downing, MD;  Location: [facility] OR;  Service: Open Heart Surgery;  Laterality: Right;    HEMORRHOID SURGERY  03/24/2024   Procedure: HEMORRHOIDECTOMY X2;  Surgeon: Candyce Champagne, MD;  Location: [facility];  Service: General;;   HERNIA REPAIR     LEFT HEART CATH N/A 03/16/2022   Procedure: Left Heart Cath;  Surgeon: Pasqual Bone, MD;  [facility] INVASIVE CV LAB;  Service: Cardiovascular;  Laterality: N/A;   POLYPECTOMY  02/24/2024   Procedure: POLYPECTOMY;  Surgeon: Sergio Dandy, MD;  Location: [facility] ENDOSCOPY;  Service: Gastroenterology;;   RECTAL EXAM UNDER ANESTHESIA N/A 03/24/2024   Procedure: HEMORRHOIDAL LIGATION & HEMORRHOIDOPEXY ANORECTAL EXAMINATION UNDER ANESTHESIA;  Surgeon: Candyce Champagne, MD;  Location: [facility];  Service: General;  Laterality: N/A;   TEE WITHOUT CARDIOVERSION N/A 03/06/2015   Procedure: TRANSESOPHAGEAL ECHOCARDIOGRAM (TEE);  Surgeon: Darlis Eisenmenger, MD;  Location: [facility] ENDOSCOPY;  Service: Cardiovascular;  Laterality: N/A;   TEE WITHOUT CARDIOVERSION N/A 05/02/2015   Procedure: TRANSESOPHAGEAL ECHOCARDIOGRAM (TEE);  Surgeon: Lenise Quince, MD;  Location: [facility] ENDOSCOPY;  Service: Cardiovascular;  Laterality: N/A;   TEE WITHOUT CARDIOVERSION N/A 03/24/2022   Procedure: TRANSESOPHAGEAL ECHOCARDIOGRAM (TEE);  Surgeon: Shon Downing, MD;  Location: [facility] OR;  Service: Open Heart Surgery;  Laterality: N/A;    Allergies:  No  Known Allergies  Social History:  reports that he quit smoking about 11 years ago. His smoking use included cigarettes. He has never used smokeless tobacco. He reports current alcohol use. He reports that he does not use drugs.  Family History: Family History  Problem Relation Age of Onset   Heart attack Mother    Breast cancer Mother    Uterine cancer Sister    Sleep apnea Brother    Sleep apnea Brother    Heart attack Maternal Grandfather    Colon cancer Neg Hx    Liver cancer Neg Hx    Stomach cancer Neg Hx    Esophageal cancer Neg Hx    Objective: Physical Exam: Vitals:   03/25/24 2121 03/26/24 0612 03/26/24 0750  03/26/24 1406  BP: (!) 152/62 (!) 111/52  (!) 144/75  Pulse: 67 62  86  Resp: 17 18  18   Temp: 98.2 F (36.8 C) 97.8 F (36.6 C)  98.4 F (36.9 C)  TempSrc: Oral Oral  Oral  SpO2: 98% 98% 94% 92%  Weight:      Height:       Physical Exam Constitutional:      Appearance: Normal appearance.  HENT:     Head: Normocephalic and atraumatic.     Mouth/Throat:     Mouth: Mucous membranes are moist.  Eyes:     Extraocular Movements: Extraocular movements intact.  Cardiovascular:     Rate and Rhythm: Normal rate and regular rhythm.  Pulmonary:     Effort: Pulmonary effort is normal.     Breath sounds: Normal breath sounds.  Abdominal:     General: Bowel sounds are normal. There is no distension.     Palpations: Abdomen is soft.     Tenderness: There is no abdominal tenderness.  Musculoskeletal:        General: Normal range of motion.     Cervical back: Normal range of motion and neck supple.     Right lower leg: Edema (1-2+) present.     Left lower leg: Edema (1-2+) present.  Skin:    General: Skin is warm and dry.  Neurological:     General: No focal deficit present.     Mental Status: He is alert.  Psychiatric:        Mood and Affect: Mood normal.     Data reviewed:  I have personally reviewed following labs and imaging studies Results for orders placed or performed during the hospital encounter of 03/24/24 (from the past 48 hours)  CBC     Status: Abnormal   Collection Time: 03/24/24  9:53 PM  Result Value Ref Range   WBC 13.7 (H) 4.0 - 10.5 K/uL   RBC 5.15 4.22 - 5.81 MIL/uL   Hemoglobin 14.6 13.0 - 17.0 g/dL   HCT 16.1 09.6 - 04.5 %   MCV 92.4 80.0 - 100.0 fL   MCH 28.3 26.0 - 34.0 pg   MCHC 30.7 30.0 - 36.0 g/dL   RDW 40.9 81.1 - 91.4 %   Platelets 256 150 - 400 K/uL   nRBC 0.0 0.0 - 0.2 %    Comment: Performed at [facility], [address], [place], [place] [address]  Creatinine, serum     Status: None   Collection Time: 03/24/24  9:53  PM  Result Value Ref Range   Creatinine, Ser 0.79 0.61 - 1.24 mg/dL   GFR, Estimated >62 >13 mL/min    Comment: (NOTE) Calculated using the CKD-EPI Creatinine  Equation (2021) Performed at [facility], [address], [place], [place] [address]   CBC     Status: Abnormal   Collection Time: 03/25/24  5:29 AM  Result Value Ref Range   WBC 15.2 (H) 4.0 - 10.5 K/uL   RBC 4.74 4.22 - 5.81 MIL/uL   Hemoglobin 13.5 13.0 - 17.0 g/dL   HCT 13.0 86.5 - 78.4 %   MCV 92.2 80.0 - 100.0 fL   MCH 28.5 26.0 - 34.0 pg   MCHC 30.9 30.0 - 36.0 g/dL   RDW 69.6 29.5 - 28.4 %   Platelets 262 150 - 400 K/uL   nRBC 0.0 0.0 - 0.2 %    Comment: Performed at [facility], [address] [place], [place] [address]  Basic metabolic panel with GFR     Status: Abnormal   Collection Time: 03/25/24  5:29 AM  Result Value Ref Range   Sodium 136 135 - 145 mmol/L   Potassium 4.5 3.5 - 5.1 mmol/L   Chloride 99 98 - 111 mmol/L   CO2 28 22 - 32 mmol/L   Glucose, Bld 202 (H) 70 - 99 mg/dL    Comment: Glucose reference range applies only to samples taken after fasting for at least 8 hours.   BUN 12 8 - 23 mg/dL   Creatinine, Ser 0.10 0.61 - 1.24 mg/dL   Calcium 8.9 8.9 - 27.2 mg/dL   GFR, Estimated >53 >66 mL/min    Comment: (NOTE) Calculated using the CKD-EPI Creatinine Equation (2021)    Anion gap 9 5 - 15    Comment: Performed at [facility], [address] [place], [place] [address]  Glucose, capillary     Status: Abnormal   Collection Time: 03/25/24 11:34 AM  Result Value Ref Range   Glucose-Capillary 159 (H) 70 - 99 mg/dL    Comment: Glucose reference range applies only to samples taken after fasting for at least 8 hours.  Glucose, capillary     Status: Abnormal   Collection Time: 03/25/24  4:29 PM  Result Value Ref Range   Glucose-Capillary 152 (H) 70 - 99 mg/dL    Comment: Glucose reference range applies only to samples taken after fasting  for at least 8 hours.  Glucose, capillary     Status: Abnormal   Collection Time: 03/25/24  9:20 PM  Result Value Ref Range   Glucose-Capillary 178 (H) 70 - 99 mg/dL    Comment: Glucose reference range applies only to samples taken after fasting for at least 8 hours.  Glucose, capillary     Status: Abnormal   Collection Time: 03/26/24  7:59 AM  Result Value Ref Range   Glucose-Capillary 113 (H) 70 - 99 mg/dL    Comment: Glucose reference range applies only to samples taken after fasting for at least 8 hours.  Glucose, capillary     Status: Abnormal   Collection Time: 03/26/24 11:49 AM  Result Value Ref Range   Glucose-Capillary 165 (H) 70 - 99 mg/dL    Comment: Glucose reference range applies only to samples taken after fasting for at least 8 hours.  CBC     Status: Abnormal   Collection Time: 03/26/24 11:56 AM  Result Value Ref Range   WBC 12.7 (H) 4.0 - 10.5 K/uL   RBC 5.01 4.22 - 5.81 MIL/uL   Hemoglobin 14.2 13.0 - 17.0 g/dL   HCT 74.2 59.5 - 63.8 %   MCV 91.2 80.0 - 100.0  fL   MCH 28.3 26.0 - 34.0 pg   MCHC 31.1 30.0 - 36.0 g/dL   RDW 16.1 09.6 - 04.5 %   Platelets 273 150 - 400 K/uL   nRBC 0.0 0.0 - 0.2 %    Comment: Performed at [facility], [address], [place], [place] [address]  Brain natriuretic peptide     Status: Abnormal   Collection Time: 03/26/24 11:56 AM  Result Value Ref Range   B Natriuretic Peptide 192.9 (H) 0.0 - 100.0 pg/mL    Comment: Performed at [facility], [address], [place], [place] [address]  Basic metabolic panel with GFR     Status: Abnormal   Collection Time: 03/26/24  2:15 PM  Result Value Ref Range   Sodium 137 135 - 145 mmol/L   Potassium 4.3 3.5 - 5.1 mmol/L   Chloride 97 (L) 98 - 111 mmol/L   CO2 31 22 - 32 mmol/L   Glucose, Bld 132 (H) 70 - 99 mg/dL    Comment: Glucose reference range applies only to samples taken after fasting for at least 8 hours.   BUN 14 8 - 23 mg/dL   Creatinine,  Ser 8.29 0.61 - 1.24 mg/dL   Calcium 9.5 8.9 - 10.3 mg/dL   GFR, Estimated >56 >21 mL/min    Comment: (NOTE) Calculated using the CKD-EPI Creatinine Equation (2021)    Anion gap 9 5 - 15    Comment: Performed at [facility], [address], [place], [place] [address]    Labs:  CBC: Recent Labs  Lab 03/22/24 1421 03/24/24 2153 03/25/24 0529 03/26/24 1156  WBC 12.3* 13.7* 15.2* 12.7*  HGB 14.0 14.6 13.5 14.2  HCT 45.6 47.6 43.7 45.7  MCV 91.4 92.4 92.2 91.2  PLT 271 256 262 273    Basic Metabolic Panel: Recent Labs  Lab 03/22/24 1421 03/24/24 2153 03/25/24 0529 03/26/24 1415  NA 135  --  136 137  K 4.3  --  4.5 4.3  CL 99  --  99 97*  CO2 30  --  28 31  GLUCOSE 138*  --  202* 132*  BUN 19  --  12 14  CREATININE 0.75 0.79 0.78 0.73  CALCIUM 9.3  --  8.9 9.5   GFR Estimated Creatinine Clearance: 143.2 mL/min (by C-G formula based on SCr of 0.73 mg/dL). Liver Function Tests: No results for input(s): "AST", "ALT", "ALKPHOS", "BILITOT", "PROT", "ALBUMIN" in the last 168 hours. No results for input(s): "LIPASE", "AMYLASE" in the last 168 hours. No results for input(s): "AMMONIA" in the last 168 hours. Coagulation profile No results for input(s): "INR", "PROTIME" in the last 168 hours.  Cardiac Enzymes: No results for input(s): "CKTOTAL", "CKMB", "CKMBINDEX", "TROPONINI" in the last 168 hours. BNP: Invalid input(s): "POCBNP" CBG: Recent Labs  Lab 03/25/24 1134 03/25/24 1629 03/25/24 2120 03/26/24 0759 03/26/24 1149  GLUCAP 159* 152* 178* 113* 165*   D-Dimer No results for input(s): "DDIMER" in the last 72 hours. Hgb A1c No results for input(s): "HGBA1C" in the last 72 hours. Lipid Profile No results for input(s): "CHOL", "HDL", "LDLCALC", "TRIG", "CHOLHDL", "LDLDIRECT" in the last 72 hours. Thyroid function studies No results for input(s): "TSH", "T4TOTAL", "T3FREE", "THYROIDAB" in the last 72 hours.  Invalid input(s):  "FREET3" Anemia work up No results for input(s): "VITAMINB12", "FOLATE", "FERRITIN", "TIBC", "IRON", "RETICCTPCT" in the last 72 hours. Urinalysis    Component Value Date/Time   COLORURINE STRAW (A) 03/20/2022 0308  APPEARANCEUR CLEAR 03/20/2022 0308   LABSPEC 1.004 (L) 03/20/2022 0308   PHURINE 6.0 03/20/2022 0308   GLUCOSEU NEGATIVE 03/20/2022 0308   HGBUR NEGATIVE 03/20/2022 0308   BILIRUBINUR NEGATIVE 03/20/2022 0308   KETONESUR NEGATIVE 03/20/2022 0308   PROTEINUR NEGATIVE 03/20/2022 0308   NITRITE NEGATIVE 03/20/2022 0308   LEUKOCYTESUR NEGATIVE 03/20/2022 0308    Microbiology No results found for this or any previous visit (from the past 240 hours).  Inpatient Medications:   Scheduled Meds:  acetaminophen  1,000 mg Oral Q6H   apixaban  5 mg Oral BID   atorvastatin  80 mg Oral QHS   budeson-glycopyrrolate-formoterol  2 puff Inhalation BID   carvedilol  6.25 mg Oral BID WC   furosemide  40 mg Oral BID   insulin aspart  0-15 Units Subcutaneous TID WC   insulin aspart  0-5 Units Subcutaneous QHS   loratadine  10 mg Oral Daily   magnesium oxide  400 mg Oral BID   montelukast  10 mg Oral QHS   pantoprazole  40 mg Oral Daily   polyethylene glycol  17 g Oral BID   sacubitril-valsartan  1 tablet Oral BID   sodium chloride flush  3 mL Intravenous Q12H   spironolactone  12.5 mg Oral QODAY   PRN Meds: sodium chloride, ALPRAZolam, diphenhydrAMINE **OR** diphenhydrAMINE, HYDROmorphone (DILAUDID) injection, lactated ringers, magic mouthwash, menthol-cetylpyridinium, methocarbamol (ROBAXIN) injection, methocarbamol, metoprolol tartrate, naphazoline-glycerin, ondansetron **OR** ondansetron (ZOFRAN) IV, oxyCODONE, phenol, prochlorperazine **OR** prochlorperazine, simethicone, sodium chloride, sodium chloride flush Continuous Infusions:  sodium chloride     lactated ringers      Radiological Exams on Admission: X-ray chest PA or AP Result Date: 03/24/2024 CLINICAL DATA:   Shortness of breath EXAM: CHEST  1 VIEW COMPARISON:  04/18/2023 FINDINGS: Prior CABG. Cardiomegaly. Elevation of the left hemidiaphragm. Mild vascular congestion. Bibasilar opacities, likely atelectasis. No acute bony abnormality. IMPRESSION: Cardiomegaly with vascular congestion.  Bibasilar atelectasis. Electronically Signed   By: Janeece Mechanic M.D.   On: 03/24/2024 19:56    Thank you for this consultation. [facility] hospitalist team will follow the patient with you.   Time spent: Greater than 50% of the 55 minute visit was spent in counseling/coordination of care for the patient as laid out in the A&P.  Faith Homes M.D. [facility] 03/26/2024, 3:09 PM Pager: Secure chat

## 2024-03-26 NOTE — Progress Notes (Signed)
 03/26/2024  Arthur Church [id number] Jun 01, 1956  CARE TEAM: PCP: Virl Grimes, PA-C  Outpatient Care Team: Patient Care Team: Hermenia Loose as PCP - General (Physician Assistant) Lei Pump, MD as PCP - Electrophysiology (Cardiology) Maximo Spar Aviva Lemmings, MD as PCP - Cardiology (Cardiology) Jenni Mody, NP (Inactive) as Nurse Practitioner (Pulmonary Disease) Mannam, Praveen, MD as Consulting Physician (Pulmonary Disease) Candyce Champagne, MD as Consulting Physician (General Surgery) Sergio Dandy, MD as Consulting Physician (Gastroenterology)  Inpatient Treatment Team: Treatment Team:  Candyce Champagne, MD Bennie Brave, MD Lexine Redder, MD Faith Homes, MD Mahat, Verlyn Goad, RN Larayne Platter, NT Kathaleen Pale, RN Amaryllis Junior, LCSW   Problem List:   Principal Problem:   Hypoxia Active Problems:   Chronic heart failure with preserved ejection fraction (HFpEF) (HCC)   PAF (paroxysmal atrial fibrillation) (HCC)   Non-ischemic cardiomyopathy (HCC)   Periodic limb movement sleep disorder   Diabetes mellitus type 2, diet-controlled (HCC)   COPD with chronic bronchitis and emphysema (HCC)   Chronic anticoagulation   Morbid obesity with BMI of 45.0-49.9, adult (HCC)   Acute on chronic respiratory failure with hypoxia (HCC)   03/24/2024  POST-OPERATIVE DIAGNOSIS:   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  Patient had evidence of thickening consistent with an old anal fissure.  Possible chronic exacerbation.  No increased sphincter tone argues  against major acute fissure.   External location RIGHT MIDLINE   about 3.5 cm from anal verge.   Internal location : Posterior midline anal canal about 1 cm from anal verge.   Large right lateral grade 3 hemorrhoid irritated.  Ligation pexing resection done.  Left lateral hemorrhoid at least grade 2 rather inflamed.  Ligation pexy resection done.  Left anterior grade 2 hemorrhoid irritated.  Ligation pexy only done.  Assessment [facility] Stay = 2 days) 2 Days Post-Op    OK    Plan:  -Pain control with sitz bath's & Tylenol around-the-clock.  He has other medicines for breakthrough which she definitely will need these first couple weeks I wrote for some extra oxycodone since he is not asking for it.  Hyperglycemia with glucose above 200 raises concern of true diabetes and not just prediabetes.  I wrote for hemoglobin A1c but I will think its happened yet.  Defer to medicine.  Chronic pulmonary issues suspicious for COPD with recent exacerbation.  Stabilizing on oxygen.  Diuresis and inhalers as needed.    -monitor electrolytes & replace as needed  Keep K>4, Mg>2, Phos>3  -VTE prophylaxis- SCDs.  Anticoagulation prophyllaxis SQ as appropriate  -mobilize as tolerated to help recovery.  Enlist therapies in moderate/high risk patients as appropriate  I updated the patient's status to the patient and nurse  Recommendations were made.  Questions were answered.  He expressed understanding & appreciation.  -Disposition: I think he is close to getting home but would want internal medicine's input to see if he needs something more aggressive from a pulmonary regimen or diuresis.  Do not know if pulmonary needs to be consulted yet or not.  Patient has home oxygen and is leaning towards just going home  later today.  Will see one internal medicine thanks.       I reviewed nursing notes, hospitalist notes, last 24 h vitals and pain scores, last 48 h intake and output, last 24 h labs and  trends, and last 24 h imaging results.  I have reviewed this patient's available data, including medical history, events of note, test results, etc as part of my evaluation.   A significant portion of that time was spent in counseling. Care during the described time interval was provided by me.  This care required moderate level of medical decision making.  03/26/2024    Subjective: (Chief complaint)  Patient sitting up at bedside.  Soreness if he sits too long.  Did sponge baths.  Tolerated a bowel movement.  No nausea or vomiting.  Desaturates into the 80s on room air when he walks in the hallways.  On 3 L.  Trying to take it off to see if he can eat.  Has his home sat finger monitor checking closely.  Objective:  Vital signs:  Vitals:   03/25/24 1959 03/25/24 2121 03/26/24 0612 03/26/24 0750  BP:  (!) 152/62 (!) 111/52   Pulse:  67 62   Resp:  17 18   Temp:  98.2 F (36.8 C) 97.8 F (36.6 C)   TempSrc:  Oral Oral   SpO2: 97% 98% 98% 94%  Weight:      Height:        Last BM Date : 03/25/24  Intake/Output   Yesterday:  04/12 0701 - 04/13 0700 In: 720 [P.O.:720] Out: -  This shift:  Total I/O In: 120 [P.O.:120] Out: 250 [Urine:250]  Bowel function:  Flatus: YES  BM:  No  Drain: (No drain)   Physical Exam:  General: Pt awake/alert in no acute distress.  Not toxic or sickly.  Comfortable. Eyes: PERRL, normal EOM.  Sclera clear.  No icterus Neuro: CN II-XII intact w/o focal sensory/motor deficits. Lymph: No head/neck/groin lymphadenopathy Psych:  No delerium/psychosis/paranoia.  Oriented x 4 HENT: Normocephalic, Mucus membranes moist.  No thrush Neck: Supple, No tracheal deviation.  No obvious thyromegaly Chest: No pain to chest wall compression.  Good respiratory excursion.  No conversational dyspnea.  No audible wheezing CV:  Pulses intact.  Regular rhythm.  No major extremity edema MS: Normal AROM mjr joints.  No obvious deformity Abdomen: Soft.   Nondistended.  Nontender.  No evidence of peritonitis.  No incarcerated hernias.  GI/rectal: No major bleeding.  Ext:  No deformity.  No mjr edema.  No cyanosis Skin: No petechiae / purpurea.  No major sores.  Warm and dry    Results:   Cultures: No results found for this or any previous visit (from the past 720 hours).  Labs: Results for orders placed or performed during the hospital encounter of 03/24/24 (from the past 48 hours)  CBC     Status: Abnormal   Collection Time: 03/24/24  9:53 PM  Result Value Ref Range   WBC 13.7 (H) 4.0 - 10.5 K/uL   RBC 5.15 4.22 - 5.81 MIL/uL   Hemoglobin 14.6 13.0 - 17.0 g/dL   HCT 16.1 09.6 - 04.5 %   MCV 92.4 80.0 - 100.0 fL   MCH 28.3 26.0 - 34.0 pg   MCHC 30.7 30.0 - 36.0 g/dL   RDW 40.9 81.1 - 91.4 %   Platelets 256 150 - 400 K/uL   nRBC 0.0 0.0 - 0.2 %    Comment: Performed at [employer] [facility]  [facility], [address] [place], [place] [address]  Creatinine, serum     Status: None   Collection Time: 03/24/24  9:53 PM  Result Value Ref Range   Creatinine, Ser 0.79 0.61 - 1.24 mg/dL   GFR, Estimated >60 >45 mL/min    Comment: (NOTE) Calculated using the CKD-EPI Creatinine Equation (2021) Performed at [facility], [address] [place], [place] [address]   CBC     Status: Abnormal   Collection Time: 03/25/24  5:29 AM  Result Value Ref Range   WBC 15.2 (H) 4.0 - 10.5 K/uL   RBC 4.74 4.22 - 5.81 MIL/uL   Hemoglobin 13.5 13.0 - 17.0 g/dL   HCT 19.1 47.8 - 29.5 %   MCV 92.2 80.0 - 100.0 fL   MCH 28.5 26.0 - 34.0 pg   MCHC 30.9 30.0 - 36.0 g/dL   RDW 62.1 30.8 - 65.7 %   Platelets 262 150 - 400 K/uL   nRBC 0.0 0.0 - 0.2 %    Comment: Performed at [facility], [address] [place], [place] [address]  Basic metabolic panel with GFR     Status: Abnormal   Collection Time: 03/25/24  5:29 AM  Result Value Ref Range   Sodium 136 135 - 145 mmol/L   Potassium 4.5 3.5 - 5.1 mmol/L    Chloride 99 98 - 111 mmol/L   CO2 28 22 - 32 mmol/L   Glucose, Bld 202 (H) 70 - 99 mg/dL    Comment: Glucose reference range applies only to samples taken after fasting for at least 8 hours.   BUN 12 8 - 23 mg/dL   Creatinine, Ser 2.95 0.61 - 1.24 mg/dL   Calcium 8.9 8.9 - 28.4 mg/dL   GFR, Estimated >13 >24 mL/min    Comment: (NOTE) Calculated using the CKD-EPI Creatinine Equation (2021)    Anion gap 9 5 - 15    Comment: Performed at [facility], [address], [place], [place] [address]  Glucose, capillary     Status: Abnormal   Collection Time: 03/25/24 11:34 AM  Result Value Ref Range   Glucose-Capillary 159 (H) 70 - 99 mg/dL    Comment: Glucose reference range applies only to samples taken after fasting for at least 8 hours.  Glucose, capillary     Status: Abnormal   Collection Time: 03/25/24  4:29 PM  Result Value Ref Range   Glucose-Capillary 152 (H) 70 - 99 mg/dL    Comment: Glucose reference range applies only to samples taken after fasting for at least 8 hours.  Glucose, capillary     Status: Abnormal   Collection Time: 03/25/24  9:20 PM  Result Value Ref Range   Glucose-Capillary 178 (H) 70 - 99 mg/dL    Comment: Glucose reference range applies only to samples taken after fasting for at least 8 hours.  Glucose, capillary     Status: Abnormal   Collection Time: 03/26/24  7:59 AM  Result Value Ref Range   Glucose-Capillary 113 (H) 70 - 99 mg/dL    Comment: Glucose reference range applies only to samples taken after fasting for at least 8 hours.    Imaging / Studies: X-ray chest PA or AP Result Date: 03/24/2024 CLINICAL DATA:  Shortness of breath EXAM: CHEST  1 VIEW COMPARISON:  04/18/2023 FINDINGS: Prior CABG. Cardiomegaly. Elevation of the left hemidiaphragm. Mild vascular congestion. Bibasilar opacities, likely atelectasis. No acute bony abnormality. IMPRESSION: Cardiomegaly with vascular  congestion.  Bibasilar atelectasis. Electronically Signed    By: Janeece Mechanic M.D.   On: 03/24/2024 19:56    Medications / Allergies: per chart  Antibiotics: Anti-infectives (From admission, onward)    Start     Dose/Rate Route Frequency Ordered Stop   03/24/24 2300  Ampicillin-Sulbactam (UNASYN) 3 g in sodium chloride 0.9 % 100 mL IVPB  Status:  Discontinued        3 g 200 mL/hr over 30 Minutes Intravenous Every 6 hours 03/24/24 2203 03/24/24 2221   03/24/24 2300  Ampicillin-Sulbactam (UNASYN) 3 g in sodium chloride 0.9 % 100 mL IVPB  Status:  Discontinued        3 g 200 mL/hr over 30 Minutes Intravenous Every 6 hours 03/24/24 2228 03/25/24 1232   03/24/24 1330  cefTRIAXone (ROCEPHIN) 2 g in sodium chloride 0.9 % 100 mL IVPB        2 g 200 mL/hr over 30 Minutes Intravenous On call to O.R. 03/24/24 1323 03/24/24 1523         Note: Portions of this report may have been transcribed using voice recognition software. Every effort was made to ensure accuracy; however, inadvertent computerized transcription errors may be present.   Any transcriptional errors that result from this process are unintentional.    Eddye Goodie, MD, FACS, MASCRS Esophageal, Gastrointestinal & Colorectal Surgery Robotic and Minimally Invasive Surgery  [facility] [address] [place], [place] [address] [phone] Fax [phone] Main  CONTACT INFORMATION: Weekday (9AM-5PM): Call [facility] main office at [phone] Weeknight (5PM-9AM) or Weekend/Holiday: Check EPIC "Web Links" tab & use "AMION" (password " [email or web address]") for General Surgery [facility] coverage  Please, DO NOT use SecureChat  (it is not reliable communication to reach operating surgeons & will lead to a delay in care).   Epic staff messaging available for outptient concerns needing 1-2 business day response.      03/26/2024  9:48 AM

## 2024-03-26 NOTE — Discharge Summary (Signed)
 Physician Discharge Summary    Arthur Church MRN: [id number] DOB/AGE: Oct 14, 1956 = 67 y.o.  Patient Care Team: Hermenia Loose as PCP - General (Physician Assistant) Lei Pump, MD as PCP - Electrophysiology (Cardiology) Hazle Lites, MD as PCP - Cardiology (Cardiology) Jenni Mody, NP (Inactive) as Nurse Practitioner (Pulmonary Disease) Mannam, Praveen, MD as Consulting Physician (Pulmonary Disease) Candyce Champagne, MD as Consulting Physician (General Surgery) Sergio Dandy, MD as Consulting Physician (Gastroenterology)  Admit date: 03/24/2024  Discharge date: 03/26/2024  Hospital Stay = 2 days    Discharge Diagnoses:  Principal Problem:   Hypoxia Active Problems:   Chronic heart failure with preserved ejection fraction (HFpEF) (HCC)   PAF (paroxysmal atrial fibrillation) (HCC)   Non-ischemic cardiomyopathy (HCC)   Periodic limb movement sleep disorder   Diabetes mellitus type 2, diet-controlled (HCC)   COPD with chronic bronchitis and emphysema (HCC)   Chronic anticoagulation   Morbid obesity with BMI of 45.0-49.9, adult (HCC)   Acute on chronic respiratory failure with hypoxia (HCC)   2 Days Post-Op  03/24/2024  POST-OPERATIVE DIAGNOSIS:   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  Postoperatively, the patient gradually mobilized and advanced to a solid diet.  Pain and other symptoms were treated aggressively.  Patient struggled with  postoperative hypoxia in recovery room so was admitted and observed.  Internal medicine was consulted.  Adjustments to medications and diuresis done.  Improved.  Still oxygen requiring which is his baseline.  By the time of discharge, the patient was walking well the hallways, eating food, having flatus.  Pain was well-controlled on an oral medications.  Based on meeting discharge criteria and continuing to recover, I felt it was safe for the patient to be discharged from the hospital to further recover with close followup. Postoperative recommendations were discussed in detail.  They are written as well.  Discharged Condition: fair but stable  Discharge Exam: Blood pressure (!) 111/52, pulse 62, temperature 97.8 F (36.6 C), temperature source Oral, resp. rate 18, height 6\' 1"  (1.854 m), weight (!) 158.8 kg, SpO2 94%.  General: Pt awake/alert/oriented x4 in No acute distress Eyes: PERRL, normal EOM.  Sclera clear.  No icterus Neuro: CN II-XII intact w/o focal sensory/motor deficits. Lymph: No head/neck/groin lymphadenopathy Psych:  No delerium/psychosis/paranoia HENT: Normocephalic, Mucus membranes moist.  No thrush Neck: Supple, No tracheal deviation Chest:  No chest wall pain w good excursion CV:  Pulses intact.  Regular rhythm MS: Normal AROM mjr joints.  No obvious deformity Abdomen: Soft.  Nondistended.  Nontender.  No evidence of peritonitis.  No incarcerated hernias. Ext:  SCDs BLE.  No mjr edema.  No cyanosis Skin: No petechiae / purpura   Disposition:    Follow-up Information     Candyce Champagne, MD Follow up on 04/24/2024.   Specialties: General Surgery, Colon and Rectal Surgery Contact information: [address] [place] [address] [phone]                 Discharge disposition: 01-Home or Self Care  Discharge Instructions     Call MD for:  hives   Complete by: As directed    Call MD for:  hives   Complete by: As directed     Call MD for:  persistant dizziness or light-headedness   Complete by: As directed    Call MD for:  persistant dizziness or light-headedness   Complete by: As directed    Call MD for:  persistant nausea and vomiting   Complete by: As directed    Call MD for:  persistant nausea and vomiting   Complete by: As directed    Call MD for:  redness, tenderness, or signs of infection (pain, swelling, redness, odor or green/yellow discharge around incision site)   Complete by: As directed    You will often notice bleeding with bowel movements.  Expect some yellow or tan drainage.  This can occur for weeks, but should be mild by the end of the first week of surgery.  Wear an absorbent pad or soft cotton gauze in your underwear until the drainage stops   Call MD for:  redness, tenderness, or signs of infection (pain, swelling, redness, odor or green/yellow discharge around incision site)   Complete by: As directed    You will often notice bleeding with bowel movements.  Expect some yellow or tan drainage.  This can occur for weeks, but should be mild by the end of the first week of surgery.  Wear an absorbent pad or soft cotton gauze in your underwear until the drainage stops   Call MD for:  severe uncontrolled pain   Complete by: As directed    Call MD for:  severe uncontrolled pain   Complete by: As directed    Diet - low sodium heart healthy   Complete by: As directed    Discharge instructions   Complete by: As directed    See Rectal Surgery instruction sheet See POUR (post-operative urinary retention) prevention protocol If total p.o. and IV fluids less than 400 mL since admission, can discharge home with a voiding trial.  If greater than 400 mL p.o. and IV total given, needs voiding trial.  If patient does not spontaneously void by 4 hours postop, I&O cath with Foley catheter.  If less than 400 mL, remove and go home.  If greater than 400 mL, leave Foley to  leg bag with leg bag teaching and have  patient call [facility] office for voiding trial in 3 days   Discharge instructions   Complete by: As directed    See Rectal Surgery instruction sheet See POUR (post-operative urinary retention) prevention protocol If total p.o. and IV fluids less than 400 mL since admission, can discharge home with a voiding trial.  If greater than 400 mL p.o. and IV total given, needs voiding trial.  If patient does not spontaneously void by 4 hours postop, I&O cath with Foley catheter.  If less than 400 mL, remove and go home.  If greater than 400 mL, leave Foley to  leg bag with leg bag teaching and have patient call [facility] office for voiding trial in 3 days   Discharge wound care:   Complete by: As directed    a. You have some fluffed cotton on top of the anus to help catch drainage and bleeding.  THERE IS NO PACKING INSIDE THE RECTUM -Let the cotton fall off with the first bowel movement or shower.  It is okay to reinforce or replace as needed.  Bleeding is common  at first and occasionally tapers off after a few weeks   b. Place soft cotton balls on the anus/wounds and use an absorbent pad in your underwear as needed to catch any drainage and help keep the area.  Try to use cotton balls or pads (regular gauze or toilet paper will stick and pull, causing pain.  Cotton will come off more easily).   c. Keep the area clean and dry.  Bathe / shower every day.  Keep the area clean by showering / bathing over the incision / wound.   It is okay to soak an open wound to help wash it.  Consider using a squeeze bottle filled with warm water to gently wash the anal area.  Wet wipes or showers / gentle washing after bowel movements is often less traumatic than regular toilet paper.           D.  Use a Sitz Bath 4-8 times a day for relief  A sitz bath is a warm water bath taken in the sitting position that covers only the hips and buttocks. It may be used for either healing or hygiene purposes. Sitz baths are also used to relieve  pain, itching, or muscle spasms.  Gently cleaned the area and the heat will help lower spasm and offer better pain control.    1. Fill the bathtub half full with warm water. 2. Sit in the water and open the drain a little. 3. Turn on the warm water to keep the tub half full. Keep the water running constantly. 4. Soak in the water for 15 to 20 minutes. 5. After the sitz bath, pat the affected area dry first.   d. You will often notice bleeding, especially with bowel movements.  This should slow down by the end of the first week of surgery.  It can take over a month to more fully stop.  Sitting on an ice pack can help.   e. Expect some drainage.  You often will have some blood or yellow drainage with open wounds.  Sometimes you will get a little leaking of liquid stool at the anus until the incision/wounds have fully close down.  This should slow down by the end of the first week of surgery, but you will have occasional bleeding or drainage up to a few months after surgery until all incisions & wounds have closed.  Wear an absorbent pad or soft cotton gauze in your underwear until the drainage stops.  -You will notice bleeding, yellow drainage, and occasional stool leaking, especially with bowel movements.  This should slow down by the end of the first week of surgery, but expect some drainage for many weeks.  Replace cotton balls/pads as needed  Wear an absorbent pad or soft cotton balls on the anus as needed to catch any drainage and help keep the area clean and dry.  This is less sticking/painful than regular gauze   Discharge wound care:   Complete by: As directed    a. You have some fluffed cotton on top of the anus to help catch drainage and bleeding.  THERE IS NO PACKING INSIDE THE RECTUM -Let the cotton fall off with the first bowel movement or shower.  It is okay to reinforce or replace as needed.  Bleeding is common at first and occasionally tapers off after a few weeks   b. Place soft  cotton balls on the anus/wounds and use an absorbent pad in your underwear as needed to catch any drainage  and help keep the area.  Try to use cotton balls or pads (regular gauze or toilet paper will stick and pull, causing pain.  Cotton will come off more easily).   c. Keep the area clean and dry.  Bathe / shower every day.  Keep the area clean by showering / bathing over the incision / wound.   It is okay to soak an open wound to help wash it.  Consider using a squeeze bottle filled with warm water to gently wash the anal area.  Wet wipes or showers / gentle washing after bowel movements is often less traumatic than regular toilet paper.           D.  Use a Sitz Bath 4-8 times a day for relief  A sitz bath is a warm water bath taken in the sitting position that covers only the hips and buttocks. It may be used for either healing or hygiene purposes. Sitz baths are also used to relieve pain, itching, or muscle spasms.  Gently cleaned the area and the heat will help lower spasm and offer better pain control.    1. Fill the bathtub half full with warm water. 2. Sit in the water and open the drain a little. 3. Turn on the warm water to keep the tub half full. Keep the water running constantly. 4. Soak in the water for 15 to 20 minutes. 5. After the sitz bath, pat the affected area dry first.   d. You will often notice bleeding, especially with bowel movements.  This should slow down by the end of the first week of surgery.  It can take over a month to more fully stop.  Sitting on an ice pack can help.   e. Expect some drainage.  You often will have some blood or yellow drainage with open wounds.  Sometimes you will get a little leaking of liquid stool at the anus until the incision/wounds have fully close down.  This should slow down by the end of the first week of surgery, but you will have occasional bleeding or drainage up to a few months after surgery until all incisions & wounds have closed.  Wear  an absorbent pad or soft cotton gauze in your underwear until the drainage stops.  -You will notice bleeding, yellow drainage, and occasional stool leaking, especially with bowel movements.  This should slow down by the end of the first week of surgery, but expect some drainage for many weeks.  Replace cotton balls/pads as needed  Wear an absorbent pad or soft cotton balls on the anus as needed to catch any drainage and help keep the area clean and dry.  This is less sticking/painful than regular gauze   Driving Restrictions   Complete by: As directed    You may drive when you are no longer taking prescription pain medication, you can comfortably sit for long periods of time, and you can safely maneuver your car and apply brakes.  Expect at least 1-2 weeks   Driving Restrictions   Complete by: As directed    You may drive when you are no longer taking prescription pain medication, you can comfortably sit for long periods of time, and you can safely maneuver your car and apply brakes.  Expect at least 1-2 weeks   Increase activity slowly   Complete by: As directed    Increase activity slowly   Complete by: As directed    Lifting restrictions   Complete by: As directed  You may resume regular (light) daily activities beginning the next day-such as daily self-care, walking, climbing stairs-gradually increasing activities as tolerated.  If you can walk 30 minutes without difficulty, it is safe to try more intense activity such as jogging, treadmill, bicycling, low-impact aerobics, swimming, etc. Save the most intensive and strenuous activity for last such as sit-ups, heavy lifting, contact sports, etc  Refrain from any heavy lifting or straining until you are off narcotics for pain control.  Remember: if it hurts to do it, don't do it: STOP   Lifting restrictions   Complete by: As directed    You may resume regular (light) daily activities beginning the next day-such as daily self-care, walking,  climbing stairs-gradually increasing activities as tolerated.  If you can walk 30 minutes without difficulty, it is safe to try more intense activity such as jogging, treadmill, bicycling, low-impact aerobics, swimming, etc. Save the most intensive and strenuous activity for last such as sit-ups, heavy lifting, contact sports, etc  Refrain from any heavy lifting or straining until you are off narcotics for pain control.  Remember: if it hurts to do it, don't do it: STOP   May shower / Bathe   Complete by: As directed    Warm water sitz baths/tub soaks/showers  x 20-30 minutes, up to 8 times a day for comfort  Just comfortable warm water.  Avoid salts/soaps.  Can try using ice packs as an alternative.  Consider using a squeeze bottle of warm water to rinse the perianal region.   May shower / Bathe   Complete by: As directed    Warm water sitz baths/tub soaks/showers  x 20-30 minutes, up to 8 times a day for comfort  Just comfortable warm water.  Avoid salts/soaps.  Can try using ice packs as an alternative.  Consider using a squeeze bottle of warm water to rinse the perianal region.   May walk up steps   Complete by: As directed    May walk up steps   Complete by: As directed    Sexual Activity Restrictions   Complete by: As directed    You may have sexual intercourse when it is comfortable. If it hurts to do it, STOP.   Sexual Activity Restrictions   Complete by: As directed    You may have sexual intercourse when it is comfortable. If it hurts to do it, STOP.       Allergies as of 03/26/2024   No Known Allergies      Medication List     STOP taking these medications    oxyCODONE 5 MG immediate release tablet Commonly known as: Oxy IR/ROXICODONE   predniSONE 50 MG tablet Commonly known as: DELTASONE       TAKE these medications    acetaminophen 500 MG tablet Commonly known as: TYLENOL Take 500 mg by mouth every 6 (six) hours as needed for mild pain, moderate pain or  headache.   ALPRAZolam 0.5 MG tablet Commonly known as: XANAX TAKE 1 TABLET BY MOUTH THREE TIMES DAILYAS NEEDED FOR ANXIETY   AMBULATORY NON FORMULARY MEDICATION Portable oxygen concentrator, 3 L [place].  Please fax to [facility]   apixaban 5 MG Tabs tablet Commonly known as: Eliquis Take 1 tablet (5 mg total) by mouth 2 (two) times daily.   atorvastatin 80 MG tablet Commonly known as: LIPITOR Take 1 tablet (80 mg total) by mouth daily.   carvedilol 6.25 MG tablet Commonly known as: COREG TAKE 1 TABLET(6.25 MG) BY MOUTH  TWICE DAILY WITH A MEAL   diazepam 5 MG tablet Commonly known as: VALIUM Take 1 tablet (5 mg total) by mouth every 8 (eight) hours as needed for muscle spasms (difficulty urinating).   Entresto 24-26 MG Generic drug: sacubitril-valsartan Take 1 tablet by mouth 2 (two) times daily.   furosemide 40 MG tablet Commonly known as: LASIX Take 1 tablet (40 mg total) by mouth 2 (two) times daily.   hydrocortisone 2.5 % cream Apply 1 Application topically daily as needed (rash, itching, irritation).   loratadine 10 MG tablet Commonly known as: CLARITIN Take 10 mg by mouth daily.   Magnesium 400 MG Tabs Take 400 mg by mouth 2 (two) times a day.   montelukast 10 MG tablet Commonly known as: SINGULAIR Take 10 mg by mouth at bedtime.   oxyCODONE-acetaminophen 10-325 MG tablet Commonly known as: Percocet Take 1 tablet by mouth every 6 (six) hours as needed for pain.   OXYGEN Inhale 2-3 L into the lungs at bedtime.   pantoprazole 40 MG tablet Commonly known as: PROTONIX Take 1 tablet (40 mg total) by mouth daily.   spironolactone 25 MG tablet Commonly known as: ALDACTONE TAKE 1/2 TABLET(12.5 MG) BY MOUTH DAILY What changed: See the new instructions.   Trelegy Ellipta 100-62.5-25 MCG/ACT Aepb Generic drug: Fluticasone-Umeclidin-Vilant Inhale 1 puff into the lungs daily.               Durable Medical Equipment  (From admission, onward)            Start     Ordered   03/24/24 2039  For home use only DME Walker rolling  Once       Comments: To help patient transfer and ambulate.  Physical / Occupational Therapy may change type of walker PRN.  Question Answer Comment  Walker: With 5 Inch Wheels   Patient needs a walker to treat with the following condition Balance problem      03/24/24 2040              Discharge Care Instructions  (From admission, onward)           Start     Ordered   03/26/24 0000  Discharge wound care:       Comments: a. You have some fluffed cotton on top of the anus to help catch drainage and bleeding.  THERE IS NO PACKING INSIDE THE RECTUM -Let the cotton fall off with the first bowel movement or shower.  It is okay to reinforce or replace as needed.  Bleeding is common at first and occasionally tapers off after a few weeks   b. Place soft cotton balls on the anus/wounds and use an absorbent pad in your underwear as needed to catch any drainage and help keep the area.  Try to use cotton balls or pads (regular gauze or toilet paper will stick and pull, causing pain.  Cotton will come off more easily).   c. Keep the area clean and dry.  Bathe / shower every day.  Keep the area clean by showering / bathing over the incision / wound.   It is okay to soak an open wound to help wash it.  Consider using a squeeze bottle filled with warm water to gently wash the anal area.  Wet wipes or showers / gentle washing after bowel movements is often less traumatic than regular toilet paper.           D.  Use a Sitz Bath 4-8  times a day for relief  A sitz bath is a warm water bath taken in the sitting position that covers only the hips and buttocks. It may be used for either healing or hygiene purposes. Sitz baths are also used to relieve pain, itching, or muscle spasms.  Gently cleaned the area and the heat will help lower spasm and offer better pain control.    1. Fill the bathtub half full with warm  water. 2. Sit in the water and open the drain a little. 3. Turn on the warm water to keep the tub half full. Keep the water running constantly. 4. Soak in the water for 15 to 20 minutes. 5. After the sitz bath, pat the affected area dry first.   d. You will often notice bleeding, especially with bowel movements.  This should slow down by the end of the first week of surgery.  It can take over a month to more fully stop.  Sitting on an ice pack can help.   e. Expect some drainage.  You often will have some blood or yellow drainage with open wounds.  Sometimes you will get a little leaking of liquid stool at the anus until the incision/wounds have fully close down.  This should slow down by the end of the first week of surgery, but you will have occasional bleeding or drainage up to a few months after surgery until all incisions & wounds have closed.  Wear an absorbent pad or soft cotton gauze in your underwear until the drainage stops.  -You will notice bleeding, yellow drainage, and occasional stool leaking, especially with bowel movements.  This should slow down by the end of the first week of surgery, but expect some drainage for many weeks.  Replace cotton balls/pads as needed  Wear an absorbent pad or soft cotton balls on the anus as needed to catch any drainage and help keep the area clean and dry.  This is less sticking/painful than regular gauze   03/26/24 1251   03/24/24 0000  Discharge wound care:       Comments: a. You have some fluffed cotton on top of the anus to help catch drainage and bleeding.  THERE IS NO PACKING INSIDE THE RECTUM -Let the cotton fall off with the first bowel movement or shower.  It is okay to reinforce or replace as needed.  Bleeding is common at first and occasionally tapers off after a few weeks   b. Place soft cotton balls on the anus/wounds and use an absorbent pad in your underwear as needed to catch any drainage and help keep the area.  Try to use cotton  balls or pads (regular gauze or toilet paper will stick and pull, causing pain.  Cotton will come off more easily).   c. Keep the area clean and dry.  Bathe / shower every day.  Keep the area clean by showering / bathing over the incision / wound.   It is okay to soak an open wound to help wash it.  Consider using a squeeze bottle filled with warm water to gently wash the anal area.  Wet wipes or showers / gentle washing after bowel movements is often less traumatic than regular toilet paper.           D.  Use a Sitz Bath 4-8 times a day for relief  A sitz bath is a warm water bath taken in the sitting position that covers only the hips and buttocks. It may be  used for either healing or hygiene purposes. Sitz baths are also used to relieve pain, itching, or muscle spasms.  Gently cleaned the area and the heat will help lower spasm and offer better pain control.    1. Fill the bathtub half full with warm water. 2. Sit in the water and open the drain a little. 3. Turn on the warm water to keep the tub half full. Keep the water running constantly. 4. Soak in the water for 15 to 20 minutes. 5. After the sitz bath, pat the affected area dry first.   d. You will often notice bleeding, especially with bowel movements.  This should slow down by the end of the first week of surgery.  It can take over a month to more fully stop.  Sitting on an ice pack can help.   e. Expect some drainage.  You often will have some blood or yellow drainage with open wounds.  Sometimes you will get a little leaking of liquid stool at the anus until the incision/wounds have fully close down.  This should slow down by the end of the first week of surgery, but you will have occasional bleeding or drainage up to a few months after surgery until all incisions & wounds have closed.  Wear an absorbent pad or soft cotton gauze in your underwear until the drainage stops.  -You will notice bleeding, yellow drainage, and occasional stool  leaking, especially with bowel movements.  This should slow down by the end of the first week of surgery, but expect some drainage for many weeks.  Replace cotton balls/pads as needed  Wear an absorbent pad or soft cotton balls on the anus as needed to catch any drainage and help keep the area clean and dry.  This is less sticking/painful than regular gauze   03/24/24 1328            Significant Diagnostic Studies:  Results for orders placed or performed during the hospital encounter of 03/24/24 (from the past 72 hours)  CBC     Status: Abnormal   Collection Time: 03/24/24  9:53 PM  Result Value Ref Range   WBC 13.7 (H) 4.0 - 10.5 K/uL   RBC 5.15 4.22 - 5.81 MIL/uL   Hemoglobin 14.6 13.0 - 17.0 g/dL   HCT 13.2 44.0 - 10.2 %   MCV 92.4 80.0 - 100.0 fL   MCH 28.3 26.0 - 34.0 pg   MCHC 30.7 30.0 - 36.0 g/dL   RDW 72.5 36.6 - 44.0 %   Platelets 256 150 - 400 K/uL   nRBC 0.0 0.0 - 0.2 %    Comment: Performed at [facility], [address] [address] [place], [place] [address]  Creatinine, serum     Status: None   Collection Time: 03/24/24  9:53 PM  Result Value Ref Range   Creatinine, Ser 0.79 0.61 - 1.24 mg/dL   GFR, Estimated >59 >56 mL/min    Comment: (NOTE) Calculated using the CKD-EPI Creatinine Equation (2021) Performed at [facility], [address] [place], [place] [address]   CBC     Status: Abnormal   Collection Time: 03/25/24  5:29 AM  Result Value Ref Range   WBC 15.2 (H) 4.0 - 10.5 K/uL   RBC 4.74 4.22 - 5.81 MIL/uL   Hemoglobin 13.5 13.0 - 17.0 g/dL   HCT 43.3 29.5 - 18.8 %   MCV 92.2 80.0 - 100.0 fL   MCH 28.5 26.0 - 34.0 pg  MCHC 30.9 30.0 - 36.0 g/dL   RDW 16.1 09.6 - 04.5 %   Platelets 262 150 - 400 K/uL   nRBC 0.0 0.0 - 0.2 %    Comment: Performed at [facility], [address], [place], [place] [address]  Basic metabolic panel with GFR     Status: Abnormal   Collection Time: 03/25/24  5:29 AM   Result Value Ref Range   Sodium 136 135 - 145 mmol/L   Potassium 4.5 3.5 - 5.1 mmol/L   Chloride 99 98 - 111 mmol/L   CO2 28 22 - 32 mmol/L   Glucose, Bld 202 (H) 70 - 99 mg/dL    Comment: Glucose reference range applies only to samples taken after fasting for at least 8 hours.   BUN 12 8 - 23 mg/dL   Creatinine, Ser 1.91 0.61 - 1.24 mg/dL   Calcium 8.9 8.9 - 47.8 mg/dL   GFR, Estimated >29 >56 mL/min    Comment: (NOTE) Calculated using the CKD-EPI Creatinine Equation (2021)    Anion gap 9 5 - 15    Comment: Performed at [facility], [address] [place], [place] [address]  Glucose, capillary     Status: Abnormal   Collection Time: 03/25/24 11:34 AM  Result Value Ref Range   Glucose-Capillary 159 (H) 70 - 99 mg/dL    Comment: Glucose reference range applies only to samples taken after fasting for at least 8 hours.  Glucose, capillary     Status: Abnormal   Collection Time: 03/25/24  4:29 PM  Result Value Ref Range   Glucose-Capillary 152 (H) 70 - 99 mg/dL    Comment: Glucose reference range applies only to samples taken after fasting for at least 8 hours.  Glucose, capillary     Status: Abnormal   Collection Time: 03/25/24  9:20 PM  Result Value Ref Range   Glucose-Capillary 178 (H) 70 - 99 mg/dL    Comment: Glucose reference range applies only to samples taken after fasting for at least 8 hours.  Glucose, capillary     Status: Abnormal   Collection Time: 03/26/24  7:59 AM  Result Value Ref Range   Glucose-Capillary 113 (H) 70 - 99 mg/dL    Comment: Glucose reference range applies only to samples taken after fasting for at least 8 hours.  Glucose, capillary     Status: Abnormal   Collection Time: 03/26/24 11:49 AM  Result Value Ref Range   Glucose-Capillary 165 (H) 70 - 99 mg/dL    Comment: Glucose reference range applies only to samples taken after fasting for at least 8 hours.  CBC     Status: Abnormal   Collection Time: 03/26/24 11:56 AM  Result  Value Ref Range   WBC 12.7 (H) 4.0 - 10.5 K/uL   RBC 5.01 4.22 - 5.81 MIL/uL   Hemoglobin 14.2 13.0 - 17.0 g/dL   HCT 65.7 84.6 - 96.2 %   MCV 91.2 80.0 - 100.0 fL   MCH 28.3 26.0 - 34.0 pg   MCHC 31.1 30.0 - 36.0 g/dL   RDW 95.2 84.1 - 32.4 %   Platelets 273 150 - 400 K/uL   nRBC 0.0 0.0 - 0.2 %    Comment: Performed at [facility], [address], [place], [place] [address]    X-ray chest PA or AP Result Date: 03/24/2024 CLINICAL DATA:  Shortness of breath EXAM: CHEST  1 VIEW COMPARISON:  04/18/2023 FINDINGS: Prior CABG. Cardiomegaly.  Elevation of the left hemidiaphragm. Mild vascular congestion. Bibasilar opacities, likely atelectasis. No acute bony abnormality. IMPRESSION: Cardiomegaly with vascular congestion.  Bibasilar atelectasis. Electronically Signed   By: Janeece Mechanic M.D.   On: 03/24/2024 19:56    Past Medical History:  Diagnosis Date   Anxiety    Chronic systolic CHF (congestive heart failure) (HCC)    a. 2D ECHO (03/06/15) EF 20-25%, diffuse HK. Asc aortic diameter: 40mm. Mild LA dilation, mild RV dilation. Mild RV systolic dysfunction   b. LifeVest placed on 02/2015 admissoin    Coronary artery disease    a. 70% LAD lesion   Dysrhythmia    Elevated TSH    ETOH abuse    Hypertension    Morbid obesity (HCC)    a. BMI ~46   Persistent atrial fibrillation (HCC)    a. newly dx 02/2015 admission. s/p successful TEE/DCCV  b. on Eliquis   Pre-diabetes    a. HgA1c 6.1    Past Surgical History:  Procedure Laterality Date   ANAL FISTULOTOMY N/A 03/24/2024   Procedure: ANAL FISTULA REPAIR - LIFT (Ligation of Intersphincteric fistula tract);  Surgeon: Candyce Champagne, MD;  Location: [facility] ORS;  Service: General;  Laterality: N/A;  REPAIR OF PERIRECTAL, POSSIBLE HEMORRHOIDECTOMY   ATRIAL FIBRILLATION ABLATION N/A 01/28/2023   Procedure: ATRIAL FIBRILLATION ABLATION;  Surgeon: Lei Pump, MD;  Location: [facility] INVASIVE CV LAB;  Service: Cardiovascular;   Laterality: N/A;   CARDIAC CATHETERIZATION N/A 04/26/2015   Procedure: Left Heart Cath and Coronary Angiography;  Surgeon: Arty Binning, MD;  Location: [facility] INVASIVE CV LAB;  Service: Cardiovascular;  Laterality: N/A;   CARDIOVERSION N/A 03/06/2015   Procedure: CARDIOVERSION;  Surgeon: Darlis Eisenmenger, MD;  Location: [facility] ENDOSCOPY;  Service: Cardiovascular;  Laterality: N/A;   CARDIOVERSION N/A 06/02/2022   Procedure: CARDIOVERSION;  Surgeon: Lake Pilgrim, MD;  Location: [facility] ENDOSCOPY;  Service: Cardiovascular;  Laterality: N/A;   CARDIOVERSION N/A 07/14/2022   Procedure: CARDIOVERSION;  Surgeon: Luana Rumple, MD;  Location: [facility] ENDOSCOPY;  Service: Cardiovascular;  Laterality: N/A;   CATARACT EXTRACTION, BILATERAL Bilateral    COLONOSCOPY WITH PROPOFOL N/A 02/24/2024   Procedure: COLONOSCOPY WITH PROPOFOL;  Surgeon: Sergio Dandy, MD;  Location: [facility] ENDOSCOPY;  Service: Gastroenterology;  Laterality: N/A;   CORONARY ANGIOGRAPHY N/A 03/18/2022   Procedure: CORONARY ANGIOGRAPHY;  Surgeon: Odie Benne, MD;  Location: [facility] INVASIVE CV LAB;  Service: Cardiovascular;  Laterality: N/A;   CORONARY ARTERY BYPASS GRAFT N/A 03/24/2022   Procedure: CORONARY ARTERY BYPASS GRAFTING (CABG) TIMES TWO USING LEFT INTERNAL MAMMARY ARTERY AND ENDOSCOPICALLY HARVESTED RIGHT GREATER SAPHENOUS VEIN;  Surgeon: Shon Downing, MD;  Location: [facility] OR;  Service: Open Heart Surgery;  Laterality: N/A;   ENDOSCOPIC MUCOSAL RESECTION  02/24/2024   Procedure: RESECTION, MUCOSAL LESION, GI TRACT, ENDOSCOPIC;  Surgeon: Sergio Dandy, MD;  Location: [facility] ENDOSCOPY;  Service: Gastroenterology;;   ENDOVEIN HARVEST OF GREATER SAPHENOUS VEIN Right 03/24/2022   Procedure: ENDOVEIN HARVEST OF GREATER SAPHENOUS VEIN;  Surgeon: Shon Downing, MD;  Location: [facility] OR;  Service: Open Heart Surgery;  Laterality: Right;   HEMORRHOID SURGERY  03/24/2024   Procedure: HEMORRHOIDECTOMY X2;  Surgeon: Candyce Champagne, MD;   Location: [facility] ORS;  Service: General;;   HERNIA REPAIR     LEFT HEART CATH N/A 03/16/2022   Procedure: Left Heart Cath;  Surgeon: Pasqual Bone, MD;  Location: [facility] INVASIVE CV LAB;  Service: Cardiovascular;  Laterality: N/A;   POLYPECTOMY  02/24/2024   Procedure: POLYPECTOMY;  Surgeon: Sergio Dandy, MD;  Location: [facility] ENDOSCOPY;  Service: Gastroenterology;;   RECTAL EXAM UNDER ANESTHESIA N/A 03/24/2024   Procedure: HEMORRHOIDAL LIGATION & HEMORRHOIDOPEXY ANORECTAL EXAMINATION UNDER ANESTHESIA;  Surgeon: Candyce Champagne, MD;  Location: [facility]S;  Service: General;  Laterality: N/A;   TEE WITHOUT CARDIOVERSION N/A 03/06/2015   Procedure: TRANSESOPHAGEAL ECHOCARDIOGRAM (TEE);  Surgeon: Darlis Eisenmenger, MD;  Location: [facility] ENDOSCOPY;  Service: Cardiovascular;  Laterality: N/A;   TEE WITHOUT CARDIOVERSION N/A 05/02/2015   Procedure: TRANSESOPHAGEAL ECHOCARDIOGRAM (TEE);  Surgeon: Lenise Quince, MD;  Location: [facility] ENDOSCOPY;  Service: Cardiovascular;  Laterality: N/A;   TEE WITHOUT CARDIOVERSION N/A 03/24/2022   Procedure: TRANSESOPHAGEAL ECHOCARDIOGRAM (TEE);  Surgeon: Shon Downing, MD;  Location: [facility] OR;  Service: Open Heart Surgery;  Laterality: N/A;    Social History   Socioeconomic History   Marital status: Widowed    Spouse name: Not on file   Number of children: 1   Years of education: Not on file   Highest education level: Not on file  Occupational History   Occupation: security guard  Tobacco Use   Smoking status: Former    Current packs/day: 0.00    Types: Cigarettes    Quit date: 01/02/2013    Years since quitting: 11.2   Smokeless tobacco: Never   Tobacco comments:    smokes about a week  Vaping Use   Vaping status: Never Used  Substance and Sexual Activity   Alcohol use: Yes    Comment: Occassional   Drug use: No   Sexual activity: Not Currently  Other Topics Concern   Not on file  Social History Narrative   Mr Degidio is a 68 year old male He is a security guard His  wife passed in April 2019. He reports he is independent with all his care needs and transportation to medical appointments   He is stating he is to either retire or go out on disability soon.    His daughter, Arlie Lain is a SW per pt   Social Drivers of Corporate investment banker Strain: Low Risk  (04/20/2023)   Overall Financial Resource Strain (CARDIA)    Difficulty of Paying Living Expenses: Not hard at all  Food Insecurity: No Food Insecurity (03/24/2024)   Hunger Vital Sign    Worried About Running Out of Food in the Last Year: Never true    Ran Out of Food in the Last Year: Never true  Transportation Needs: No Transportation Needs (03/24/2024)   PRAPARE - Administrator, Civil Service (Medical): No    Lack of Transportation (Non-Medical): No  Physical Activity: Not on file  Stress: Not on file  Social Connections: Moderately Integrated (03/24/2024)   Social Connection and Isolation Panel [NHANES]    Frequency of Communication with Friends and Family: More than three times a week    Frequency of Social Gatherings with Friends and Family: More than three times a week    Attends Religious Services: More than 4 times per year    Active Member of [employer] or Organizations: Yes    Attends Banker Meetings: Never    Marital Status: Widowed  Intimate Partner Violence: Not At Risk (03/24/2024)   Humiliation, Afraid, Rape, and Kick questionnaire    Fear of Current or Ex-Partner: No    Emotionally Abused: No    Physically Abused: No    Sexually Abused: No    Family History  Problem Relation Age of Onset   Heart  attack Mother    Breast cancer Mother    Uterine cancer Sister    Sleep apnea Brother    Sleep apnea Brother    Heart attack Maternal Grandfather    Colon cancer Neg Hx    Liver cancer Neg Hx    Stomach cancer Neg Hx    Esophageal cancer Neg Hx     Current Facility-Administered Medications  Medication Dose Route Frequency Provider Last Rate Last Admin    0.9 %  sodium chloride infusion  250 mL Intravenous PRN Candyce Champagne, MD       acetaminophen (TYLENOL) tablet 1,000 mg  1,000 mg Oral Q6H Arlina Sabina, MD   1,000 mg at 03/26/24 0514   ALPRAZolam (XANAX) tablet 0.5 mg  0.5 mg Oral TID PRN Candyce Champagne, MD       apixaban Herby Lolling) tablet 5 mg  5 mg Oral BID Candyce Champagne, MD   5 mg at 03/26/24 1022   atorvastatin (LIPITOR) tablet 80 mg  80 mg Oral QHS Caide Campi, MD   80 mg at 03/25/24 2341   budeson-glycopyrrolate-formoterol (BREZTRI) 160-9-4.8 MCG/ACT inhaler 2 puff  2 puff Inhalation BID Candyce Champagne, MD   2 puff at 03/26/24 0749   carvedilol (COREG) tablet 6.25 mg  6.25 mg Oral BID WC Candyce Champagne, MD   6.25 mg at 03/26/24 0810   diphenhydrAMINE (BENADRYL) 12.5 MG/5ML elixir 12.5 mg  12.5 mg Oral Q6H PRN Candyce Champagne, MD       Or   diphenhydrAMINE (BENADRYL) injection 12.5 mg  12.5 mg Intravenous Q6H PRN Candyce Champagne, MD       furosemide (LASIX) tablet 40 mg  40 mg Oral BID Candyce Champagne, MD   40 mg at 03/26/24 0810   HYDROmorphone (DILAUDID) injection 0.5-2 mg  0.5-2 mg Intravenous Q2H PRN Candyce Champagne, MD       insulin aspart (novoLOG) injection 0-15 Units  0-15 Units Subcutaneous TID WC Candyce Champagne, MD   3 Units at 03/26/24 1214   insulin aspart (novoLOG) injection 0-5 Units  0-5 Units Subcutaneous QHS Candyce Champagne, MD       lactated ringers bolus 1,000 mL  1,000 mL Intravenous Q8H PRN Candyce Champagne, MD       loratadine (CLARITIN) tablet 10 mg  10 mg Oral Daily Candyce Champagne, MD   10 mg at 03/26/24 1022   magic mouthwash  15 mL Oral QID PRN Candyce Champagne, MD       magnesium oxide (MAG-OX) tablet 400 mg  400 mg Oral BID Candyce Champagne, MD   400 mg at 03/26/24 1022   menthol-cetylpyridinium (CEPACOL) lozenge 3 mg  1 lozenge Oral PRN Candyce Champagne, MD       methocarbamol (ROBAXIN) injection 1,000 mg  1,000 mg Intravenous Q6H PRN Candyce Champagne, MD       methocarbamol (ROBAXIN) tablet 1,000 mg  1,000 mg Oral Q6H PRN Candyce Champagne, MD       metoprolol tartrate (LOPRESSOR) injection 5 mg  5 mg Intravenous Q6H PRN Candyce Champagne, MD       montelukast (SINGULAIR) tablet 10 mg  10 mg Oral QHS Edessa Jakubowicz, MD   10 mg at 03/25/24 2341   naphazoline-glycerin (CLEAR EYES REDNESS) ophth solution 1-2 drop  1-2 drop Both Eyes QID PRN Candyce Champagne, MD       ondansetron (ZOFRAN-ODT) disintegrating tablet 4 mg  4 mg Oral Q6H PRN Candyce Champagne, MD       Or   ondansetron (ZOFRAN)  injection 4 mg  4 mg Intravenous Q6H PRN Candyce Champagne, MD       oxyCODONE (Oxy IR/ROXICODONE) immediate release tablet 5-10 mg  5-10 mg Oral Q4H PRN Candyce Champagne, MD   10 mg at 03/26/24 1112   pantoprazole (PROTONIX) EC tablet 40 mg  40 mg Oral Daily Candyce Champagne, MD   40 mg at 03/26/24 1022   phenol (CHLORASEPTIC) mouth spray 2 spray  2 spray Mouth/Throat PRN Candyce Champagne, MD       polyethylene glycol (MIRALAX / GLYCOLAX) packet 17 g  17 g Oral BID Candyce Champagne, MD   17 g at 03/25/24 0951   prochlorperazine (COMPAZINE) tablet 10 mg  10 mg Oral Q6H PRN Candyce Champagne, MD       Or   prochlorperazine (COMPAZINE) injection 5-10 mg  5-10 mg Intravenous Q6H PRN Candyce Champagne, MD       sacubitril-valsartan (ENTRESTO) 24-26 mg per tablet  1 tablet Oral BID Candyce Champagne, MD   1 tablet at 03/26/24 1022   simethicone (MYLICON) chewable tablet 40 mg  40 mg Oral Q6H PRN Candyce Champagne, MD       sodium chloride (OCEAN) 0.65 % nasal spray 1-2 spray  1-2 spray Each Nare Q6H PRN Candyce Champagne, MD       sodium chloride flush (NS) 0.9 % injection 3 mL  3 mL Intravenous Q12H Carisma Troupe, MD   3 mL at 03/25/24 2130   sodium chloride flush (NS) 0.9 % injection 3 mL  3 mL Intravenous PRN Candyce Champagne, MD       spironolactone (ALDACTONE) tablet 12.5 mg  12.5 mg Oral QODAY Tijana Walder, MD   12.5 mg at 03/25/24 8657     No Known Allergies  Signed:   Eddye Goodie, MD, FACS, MASCRS Esophageal, Gastrointestinal & Colorectal Surgery Robotic and Minimally  Invasive Surgery  [facility] [address] [place], [place] [address] [phone] Fax [phone] Main  CONTACT INFORMATION: Weekday (9AM-5PM): Call [facility] main office at [phone] Weeknight (5PM-9AM) or Weekend/Holiday: Check EPIC "Web Links" tab & use "AMION" (password " [email or web address]") for General Surgery [facility] coverage  Please, DO NOT use SecureChat  (it is not reliable communication to reach operating surgeons & will lead to a delay in care).   Epic staff messaging available for outptient concerns needing 1-2 business day response.      03/26/2024, 12:52 PM

## 2024-03-27 LAB — HEMOGLOBIN A1C
Hgb A1c MFr Bld: 7 % — ABNORMAL HIGH (ref 4.8–5.6)
Hgb A1c MFr Bld: 7.1 % — ABNORMAL HIGH (ref 4.8–5.6)
Mean Plasma Glucose: 154 mg/dL
Mean Plasma Glucose: 157 mg/dL

## 2024-03-28 LAB — SURGICAL PATHOLOGY

## 2024-04-04 ENCOUNTER — Other Ambulatory Visit (HOSPITAL_COMMUNITY): Payer: Self-pay | Admitting: Internal Medicine

## 2024-04-19 ENCOUNTER — Encounter: Payer: Self-pay | Admitting: Internal Medicine

## 2024-04-19 ENCOUNTER — Ambulatory Visit: Admitting: Internal Medicine

## 2024-04-19 VITALS — BP 112/66 | HR 70 | Ht 73.0 in | Wt 343.0 lb

## 2024-04-19 DIAGNOSIS — J310 Chronic rhinitis: Secondary | ICD-10-CM | POA: Diagnosis not present

## 2024-04-19 DIAGNOSIS — I480 Paroxysmal atrial fibrillation: Secondary | ICD-10-CM

## 2024-04-19 DIAGNOSIS — R0602 Shortness of breath: Secondary | ICD-10-CM

## 2024-04-19 DIAGNOSIS — J9611 Chronic respiratory failure with hypoxia: Secondary | ICD-10-CM | POA: Diagnosis not present

## 2024-04-19 MED ORDER — TRELEGY ELLIPTA 100-62.5-25 MCG/ACT IN AEPB: 1.0000 | INHALATION_SPRAY | Freq: Every day | RESPIRATORY_TRACT | 11 refills | Status: AC

## 2024-04-19 MED ORDER — ALBUTEROL SULFATE HFA 108 (90 BASE) MCG/ACT IN AERS: 2.0000 | INHALATION_SPRAY | Freq: Four times a day (QID) | RESPIRATORY_TRACT | 5 refills | Status: DC | PRN

## 2024-04-19 NOTE — Progress Notes (Signed)
 Arthur Church    [id number]    [birth year]  Primary Care Physician:Osborne, Edwin Grain Date of Appointment: 04/19/2024 Established Patient Visit  Chief complaint:   Chief Complaint  Patient presents with   Pulmonary Consult    Here to re est care- seen Dr. Waylan Haggard in 2020 for COPD. He feels like his breathing has been a little better since the last time he was here. Has not been on CPAP- using 2 lpm 02 with sleep only.      HPI: Arthur Church is a 68 y.o. man with past medical history of dyspnea, nocturnal hypoxemia obesity and hfref.  Interval Updates: Discussed the use of AI scribe software for clinical note transcription with the patient, who gave verbal consent to proceed.  History of Present Illness Arthur Church "Arthur Church" is a 68 year old male with shortness of breath and sleep apnea who presents for pulmonary follow-up.  He returns to the clinic after a change in insurance coverage, now on Medicare, preferring to return to this clinic after previously switching providers due to insurance issues. He was previously seen for shortness of breath and sleep apnea.  His breathing is generally stable on a day-to-day basis but is limited with exertion. He uses Trelegy, one puff once a day, and has a rescue inhaler, albuterol , which he uses very infrequently, less than once a month. He has not been hospitalized for pneumonia or bronchitis but has had hospitalizations related to other surgeries.  He has a history of sleep apnea, but previous studies showed no evidence of obstructive sleep apnea. Initially, he was on a CPAP machine, but repeat sleep studies have been positive for hypoxemia without apneas or hypopneas. He currently uses 2-3 liters of oxygen  at night. He experiences 'so-so' sleep quality, occasional daytime sleepiness, but no headaches. He avoids napping to prevent the need to reapply oxygen  during the day.  Last month he had surgery for anal fissure which  post-operatively was complicated by atelectasis and hypoxemia requiring brief hospital stay. His oxygen  levels have improved over time, now reaching 93-95% compared to 90% previously.  He experiences nasal congestion and throat 'clogging,' particularly on one side, which leads to epistaxis when using nasal sprays. He uses Singulair  and Claritin  for allergies and has a nasal lavage system, which he plans to use more frequently to alleviate symptoms.  I have reviewed the patient's family social and past medical history and updated as appropriate.   Past Medical History:  Diagnosis Date   Anxiety    Chronic systolic CHF (congestive heart failure) (HCC)    a. 2D ECHO (03/06/15) EF 20-25%, diffuse HK. Asc aortic diameter: 40mm. Mild LA dilation, mild RV dilation. Mild RV systolic dysfunction   b. LifeVest placed on 02/2015 admissoin    Coronary artery disease    a. 70% LAD lesion   Dysrhythmia    Elevated TSH    ETOH abuse    Hypertension    Morbid obesity (HCC)    a. BMI ~46   Persistent atrial fibrillation (HCC)    a. newly dx 02/2015 admission. s/p successful TEE/DCCV  b. on Eliquis    Pre-diabetes    a. HgA1c 6.1    Past Surgical History:  Procedure Laterality Date   ANAL FISTULOTOMY N/A 03/24/2024   Procedure: ANAL FISTULA REPAIR - LIFT (Ligation of Intersphincteric fistula tract);  Surgeon: Candyce Champagne, MD;  Location: [facility];  Service: General;  Laterality: N/A;  REPAIR  OF PERIRECTAL, POSSIBLE HEMORRHOIDECTOMY   ATRIAL FIBRILLATION ABLATION N/A 01/28/2023   Procedure: ATRIAL FIBRILLATION ABLATION;  Surgeon: Lei Pump, MD;  Location: [facility] INVASIVE CV LAB;  Service: Cardiovascular;  Laterality: N/A;   CARDIAC CATHETERIZATION N/A 04/26/2015   Procedure: Left Heart Cath and Coronary Angiography;  Surgeon: Arty Binning, MD;  Location: [facility] INVASIVE CV LAB;  Service: Cardiovascular;  Laterality: N/A;   CARDIOVERSION N/A 03/06/2015   Procedure: CARDIOVERSION;  Surgeon: Darlis Eisenmenger, MD;  Location: [facility] ENDOSCOPY;  Service: Cardiovascular;  Laterality: N/A;   CARDIOVERSION N/A 06/02/2022   Procedure: CARDIOVERSION;  Surgeon: Lake Pilgrim, MD;  Location: [facility] ENDOSCOPY;  Service: Cardiovascular;  Laterality: N/A;   CARDIOVERSION N/A 07/14/2022   Procedure: CARDIOVERSION;  Surgeon: Luana Rumple, MD;  Location: [facility] ENDOSCOPY;  Service: Cardiovascular;  Laterality: N/A;   CATARACT EXTRACTION, BILATERAL Bilateral    COLONOSCOPY WITH PROPOFOL  N/A 02/24/2024   Procedure: COLONOSCOPY WITH PROPOFOL ;  Surgeon: Sergio Dandy, MD;  Location: [facility] ENDOSCOPY;  Service: Gastroenterology;  Laterality: N/A;   CORONARY ANGIOGRAPHY N/A 03/18/2022   Procedure: CORONARY ANGIOGRAPHY;  Surgeon: Odie Benne, MD;  Location: [facility] INV[facility] CV LAB;  Service: Cardiovascular;  Laterality: N/A;   CORONARY ARTERY BYPASS GRAFT N/A 03/24/2022   Procedure: CORONARY ARTERY BYPASS GRAFTING (CABG) TIMES TWO USING LEFT INTERNAL MAMMARY ARTERY AND ENDOSCOPICALLY HARVESTED RIGHT GREATER SAPHENOUS VEIN;  Surgeon: Shon Downing, MD;  Location: [facility] OR;  Service: Open Heart Surgery;  Laterality: N/A;   ENDOSCOPIC MUCOSAL RESECTION  02/24/2024   Procedure: RESECTION, MUCOSAL LESION, GI TRACT, ENDOSCOPIC;  Surgeon: Sergio Dandy, MD;  Location: [facility] ENDOSCOPY;  Service: Gastroenterology;;   ENDOVEIN HARVEST OF GREATER SAPHENOUS VEIN Right 03/24/2022   Procedure: ENDOVEIN HARVEST OF GREATER SAPHENOUS VEIN;  Surgeon: Shon Downing, MD;  Location: [facility] OR;  Service: Open Heart Surgery;  Laterality: Right;   HEMORRHOID SURGERY  03/24/2024   Procedure: HEMORRHOIDECTOMY X2;  Surgeon: Candyce Champagne, MD;  Location: [facility];  Service: General;;   HERNIA REPAIR     LEFT HEART CATH N/A 03/16/2022   Procedure: Left Heart Cath;  Surgeon: Pasqual Bone, MD;  [facility]: [facility];  Service: Cardiovascular;  Laterality: N/A;   POLYPECTOMY  02/24/2024   Procedure: POLYPECTOMY;  Surgeon: Sergio Dandy, MD;  Location: [facility] ENDOS[facility];  Service: Gastroenterology;;   RECTAL EXAM UNDER ANESTHESIA N/A 03/24/2024   Procedure: HEMORRHOIDAL LIGATION & HEMORRHOIDOPEXY ANORECTAL EXAMINATION UNDER ANESTHESIA;  Surgeon: Candyce Champagne, MD;  Location: [facility];  Service: General;  Laterality: N/A;   TEE WITHOUT CARDIOVERSION N/A 03/06/2015   Procedure: TRANSESOPHAGEAL ECHOCARDIOGRAM (TEE);  Surgeon: Darlis Eisenmenger, MD;  Location: [facility] ENDOSCOPY;  Service: Cardiovascular;  Laterality: N/A;   TEE WITHOUT CARDIOVERSION N/A 05/02/2015   Procedure: TRANSESOPHAGEAL ECHOCARDIOGRAM (TEE);  Surgeon: Lenise Quince, MD;  Location: [facility] ENDOSCOPY;  Service: Cardiovascular;  Laterality: N/A;   TEE WITHOUT CARDIOVERSION N/A 03/24/2022   Procedure: TRANSESOPHAGEAL ECHOCARDIOGRAM (TEE);  Surgeon: Shon Downing, MD;  Location: [facility] OR;  Service: Open Heart Surgery;  Laterality: N/A;    Family History  Problem Relation Age of Onset   Heart attack Mother    Breast cancer Mother    Uterine cancer Sister    Sleep apnea Brother    Sleep apnea Brother    Heart attack Maternal Grandfather    Colon cancer Neg Hx    Liver cancer Neg Hx    Stomach cancer Neg Hx    Esophageal cancer Neg Hx  Social History   Occupational History   Occupation: security guard  Tobacco Use   Smoking status: Former    Current packs/day: 0.00    Types: Cigarettes    Quit date: 01/02/2013    Years since quitting: 11.3   Smokeless tobacco: Never   Tobacco comments:    smokes about a week  Vaping Use   Vaping status: Never Used  Substance and Sexual Activity   Alcohol use: Yes    Comment: Occassional   Drug use: No   Sexual activity: Not Currently     Physical Exam: Blood pressure 112/66, pulse 70, height 6\' 1"  (1.854 m), weight (!) 343 lb (155.6 kg), SpO2 95%.  Gen:      No acute distress, morbidly obese ENT:  no thrush, mallampati IV,no nasal polyps, mucus membranes moist Lungs:    diminished, No increased respiratory  effort, symmetric chest wall excursion, clear to auscultation bilaterally, \no wheezes or crackles CV:         Regular rate and rhythm; no murmurs, rubs, or gallops.  No pedal edema   Data Reviewed: Imaging: I have personally reviewed the chest xray April 2025 shows cardiomegaly and mild bibasilar atelectasis  Echo: LVEF 50%, grade 2 diastolic dysfunction,  PFTs:     Latest Ref Rng & Units 03/19/2022   11:17 AM 08/31/2019   10:46 AM  PFT Results  FVC-Pre L 3.95  2.84   FVC-Predicted Pre % 79  56   FVC-Post L  3.07   FVC-Predicted Post %  60   Pre FEV1/FVC % % 61  66   Post FEV1/FCV % %  67   FEV1-Pre L 2.41  1.86   FEV1-Predicted Pre % 64  49   FEV1-Post L  2.06   DLCO uncorrected ml/min/mmHg  21.31   DLCO UNC% %  73   DLVA Predicted %  100   TLC L  6.04   TLC % Predicted %  81   RV % Predicted %  133    I have personally reviewed the patient's PFTs and moderate airflow limitation although PFTS difficult to interpret in the setting of coughing during expiratory phase.  Labs: Lab Results  Component Value Date   NA 137 03/26/2024   K 4.3 03/26/2024   CO2 31 03/26/2024   GLUCOSE 132 (H) 03/26/2024   BUN 14 03/26/2024   CREATININE 0.73 03/26/2024   CALCIUM  9.5 03/26/2024   GFR 107.07 07/23/2015   EGFR 84 01/25/2023   GFRNONAA >60 03/26/2024   Lab Results  Component Value Date   WBC 12.7 (H) 03/26/2024   HGB 14.2 03/26/2024   HCT 45.7 03/26/2024   MCV 91.2 03/26/2024   PLT 273 03/26/2024    Immunization status: Immunization History  Administered Date(s) Administered   Influenza Split 10/11/2018   Influenza,inj,Quad PF,6+ Mos 10/21/2017, 09/01/2019, 09/04/2021   Influenza-Unspecified 10/21/2017, 10/11/2018   PFIZER Comirnaty(Gray Top)Covid-19 Tri-Sucrose Vaccine 01/03/2020, 01/24/2020, 09/16/2020, 03/19/2021   Pfizer Covid-19 Vaccine Bivalent Booster 2yrs & up 09/25/2021, 05/22/2022   Pfizer(Comirnaty)Fall Seasonal Vaccine 12 years and older 12/17/2022,  08/21/2023   Pneumococcal Polysaccharide-23 09/01/2019   Tdap 04/06/2015    External Records Personally Reviewed: pulmonary, hospital stay.   Assessment and Plan Assessment & Plan  Multifactorial dyspnea secondary to COPD, obesity Body mass index is 45.25 kg/m., nocturnal hypoxemia.   Chronic Obstructive Pulmonary Disease (COPD) COPD with exertional dyspnea, managed with Trelegy inhaler. Rare albuterol  use. No recent exacerbations. Oxygen  saturation improved to 93-95%. - Refill Trelegy  inhaler and albuterol  inhaler.  Nocturnal Hypoxemia Using 2-3 liters of oxygen  at night with improved nocturnal saturation at 95-97%. No obstructive sleep apnea. - Continue using 2-3 liters of oxygen  at night. - consider ONO study while on 3LNC to evaluate for alveolar hypoventilation  Allergic rhinitis Allergic rhinitis with nasal congestion and throat clearing. On Singulair  and Claritin . Difficulty with medicated nasal sprays due to epistaxis. Advised nasal saline lavage. - Use nasal saline lavage daily, especially during allergy season. - Consider topical antihistamine nasal spray such as astelin if symptoms persist.   Return to Care: Return in about 6 months (around 10/20/2024).   Louie Rover, MD Pulmonary and Critical Care Medicine [facility] Office:[phone]

## 2024-04-19 NOTE — Patient Instructions (Signed)
 It was a pleasure to see you today!  Please schedule follow up with myself in 6 months.  If my schedule is not open yet, we will contact you with a reminder closer to that time. Please call [phone] if you haven't heard from us  a month before, and always call us  sooner if issues or concerns arise. You can also send us  a message through MyChart, but but aware that this is not to be used for urgent issues and it may take up to 5-7 days to receive a reply. Please be aware that you will likely be able to view your results before I have a chance to respond to them. Please give us  5 business days to respond to any non-urgent results.   VISIT SUMMARY:  You visited the clinic today for a follow-up on your breathing issues. Your breathing issues are related to your COPD, heart issues, and weight.    YOUR PLAN:  -CHRONIC OBSTRUCTIVE PULMONARY DISEASE (COPD): COPD is a chronic lung disease that makes it hard to breathe, especially with exertion. Your condition is stable with the use of Trelegy inhaler once a day and occasional use of a rescue inhaler, albuterol . Your oxygen  levels have improved, and there have been no recent flare-ups. Continue using your Trelegy and albuterol  inhalers as prescribed.  -USE OF SUPPLEMENTAL OXYGEN : You use 2-3 liters of oxygen  at night, which has improved your nighttime oxygen  levels. There is no evidence of obstructive sleep apnea, and you prefer not to use CPAP/BiPAP. Continue using 2-3 liters of oxygen  at night.  -ALLERGIC RHINITIS: Allergic rhinitis is an allergic reaction that causes nasal congestion and throat clearing. You are currently taking Singulair  and Claritin . You have been advised to use a nasal saline lavage daily, especially during allergy season, and to consider a topical antihistamine nasal spray if your symptoms persist.

## 2024-04-24 DIAGNOSIS — K642 Third degree hemorrhoids: Secondary | ICD-10-CM | POA: Insufficient documentation

## 2024-04-25 ENCOUNTER — Telehealth: Payer: Self-pay

## 2024-04-25 NOTE — Telephone Encounter (Signed)
 Appt has been cancelled  Left message on machine to call back

## 2024-04-25 NOTE — Telephone Encounter (Signed)
-----   Message from [facility] sent at 04/24/2024  4:04 PM EDT ----- Regarding: RE: Had anorectal surgery.  Consider delaying colonoscopy. Thank you for letting me know, we will cancel June procedure and place a recall for colonoscopy in October 2025 ----- Message ----- From: Candyce Champagne, MD Sent: 04/24/2024   3:57 PM EDT To: Kavitha Nandigam V, MD Subject: Had anorectal surgery.  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  His pain is gone down but he still has an external wound that is going to take a month or 2 to close up.  He is due to get a colonoscopy with you on June 17.  He will not be healed by then.  Would like to avoid a prep stressing out his repair and setting him back.  I think it may be wiser to postpone this until September/October when he is more likely to be healed and tolerating the bowel prep.   Wondering if you would consider postponing things  Thank you for your consideration  Your regularly humbled and sometimes obedient colleague,  SCG

## 2024-04-26 ENCOUNTER — Ambulatory Visit (HOSPITAL_COMMUNITY): Attending: Cardiology

## 2024-04-26 ENCOUNTER — Ambulatory Visit: Payer: Self-pay | Admitting: Internal Medicine

## 2024-04-26 DIAGNOSIS — Z951 Presence of aortocoronary bypass graft: Secondary | ICD-10-CM | POA: Diagnosis present

## 2024-04-26 DIAGNOSIS — I255 Ischemic cardiomyopathy: Secondary | ICD-10-CM | POA: Diagnosis present

## 2024-04-26 LAB — ECHOCARDIOGRAM COMPLETE
Area-P 1/2: 3.92 cm2
S' Lateral: 4.9 cm

## 2024-04-26 MED ORDER — PERFLUTREN LIPID MICROSPHERE
1.0000 mL | INTRAVENOUS | Status: AC | PRN
  Administered 2024-04-26: 2 mL via INTRAVENOUS

## 2024-04-26 NOTE — Telephone Encounter (Signed)
 The patient has been notified of this information and all questions answered.

## 2024-04-28 ENCOUNTER — Other Ambulatory Visit (HOSPITAL_COMMUNITY): Payer: Self-pay | Admitting: Physician Assistant

## 2024-05-05 ENCOUNTER — Ambulatory Visit: Admitting: Pulmonary Disease

## 2024-05-25 ENCOUNTER — Other Ambulatory Visit (HOSPITAL_COMMUNITY): Payer: Self-pay | Admitting: Family Medicine

## 2024-05-26 NOTE — Telephone Encounter (Signed)
 The pt has been advised that Dr Leonia Raman recommends waiting until October to reschedule procedure.  He agrees and thanked me for the call. Recall entered

## 2024-05-26 NOTE — Telephone Encounter (Signed)
 Patient requesting f/u call to discuss previous note. Please advise.   Thank you

## 2024-05-30 ENCOUNTER — Ambulatory Visit (HOSPITAL_COMMUNITY): Admit: 2024-05-30 | Admitting: Gastroenterology

## 2024-05-30 ENCOUNTER — Encounter (HOSPITAL_COMMUNITY): Payer: Self-pay

## 2024-05-30 SURGERY — COLONOSCOPY
Anesthesia: Monitor Anesthesia Care

## 2024-06-01 ENCOUNTER — Other Ambulatory Visit: Payer: Self-pay

## 2024-06-01 ENCOUNTER — Telehealth: Payer: Self-pay

## 2024-06-01 DIAGNOSIS — J9611 Chronic respiratory failure with hypoxia: Secondary | ICD-10-CM

## 2024-06-01 NOTE — Telephone Encounter (Signed)
 Copied from CRM [id number]. Topic: Clinical - Order For Equipment >> Jun 01, 2024 10:00 AM Margarette Shawl wrote: Reason for CRM:   Pt is contacting clinic to report he is needing providers authorization for oxygen  supplies to be sent to [employer] for insurance to cover supplies. He reports his DME company recently changed.    CB#  [phone]  Order for O2 supplies sent to [employer]   NFN

## 2024-06-06 ENCOUNTER — Ambulatory Visit: Admitting: Pulmonary Disease

## 2024-07-18 ENCOUNTER — Telehealth: Payer: Self-pay

## 2024-07-18 ENCOUNTER — Telehealth: Payer: Self-pay | Admitting: Gastroenterology

## 2024-07-18 NOTE — Telephone Encounter (Signed)
 Spoke with patient. Advised him that on 04/24/2024 Dr. Sheldon had recommended the patient wait until September or October to have his colonoscopy to ensure he is fully healed from his anorectal surgery. Patient states that he will reach out to Dr. Sheldon to have him tell us  it's ok, because he wants his procedure to be done ASAP.

## 2024-07-18 NOTE — Telephone Encounter (Signed)
 Patient requesting to have hospital procedure sooner than October. Please advise.

## 2024-07-18 NOTE — Telephone Encounter (Signed)
   Pre-operative Risk Assessment    Patient Name: Arthur Church  DOB: Jun 27, 1956 MRN: [id number]   Date of last office visit: 02/18/24 VINIE MAXCY, MD Date of next office visit: NONE   Request for Surgical Clearance    Procedure:  BILATERAL UPPER EYELID BLEPHAROPTOSIS REPAIR AND RIGHT ORBITOTOMY FOR PROLAPSED ORBITAL FAT  Date of Surgery:  Clearance 07/31/24                                Surgeon:  DR OSBORNE PONDER Surgeon's Group or Practice Name:  [facility] Phone number:  [phone] Fax number:  [phone]   Type of Clearance Requested:   - Medical  - Pharmacy:  Hold Apixaban  (Eliquis )     Type of Anesthesia:  Not Indicated   Additional requests/questions:    SignedLucie DELENA Ku   07/18/2024, 12:56 PM

## 2024-07-26 NOTE — Telephone Encounter (Signed)
 Has this been cleared? And do I need to fax anything to requesting office? Please advise.

## 2024-07-26 NOTE — Telephone Encounter (Signed)
 [facility] is calling back for an update

## 2024-07-26 NOTE — Telephone Encounter (Signed)
 Patient with diagnosis of A Fib on Eliquis  for anticoagulation.    Procedure: BILATERAL UPPER EYELID BLEPHAROPTOSIS REPAIR AND RIGHT ORBITOTOMY FOR PROLAPSED ORBITAL FAT  Date of procedure: 07/31/24   CHA2DS2-VASc Score = 5  This indicates a 7.2% annual risk of stroke. The patient's score is based upon: CHF History: 1 HTN History: 1 Diabetes History: 1 Stroke History: 0 Vascular Disease History: 1 Age Score: 1 Gender Score: 0    CrCl 153 ml/min using adj body weight Platelet count 273K   Per office protocol, patient can hold Eliquis  for 2 days prior to procedure.    **This guidance is not considered finalized until pre-operative APP has relayed final recommendations.**

## 2024-07-27 ENCOUNTER — Telehealth: Payer: Self-pay | Admitting: *Deleted

## 2024-07-27 ENCOUNTER — Ambulatory Visit: Attending: Nurse Practitioner | Admitting: Nurse Practitioner

## 2024-07-27 DIAGNOSIS — Z01810 Encounter for preprocedural cardiovascular examination: Secondary | ICD-10-CM

## 2024-07-27 NOTE — Telephone Encounter (Signed)
   Name: Arthur Church  DOB: 02-13-1956  MRN: [id number]  Primary Cardiologist: Vinie JAYSON Maxcy, MD   Preoperative team, please contact this patient and set up a phone call appointment for further preoperative risk assessment. Please obtain consent and complete medication review. Thank you for your help.  I confirm that guidance regarding antiplatelet and oral anticoagulation therapy has been completed and, if necessary, noted below.   Per office protocol, patient can hold Eliquis  for 2 days prior to procedure.      I also confirmed the patient resides in the state of  . As per [facility] Medical Board telemedicine laws, the patient must reside in the state in which the provider is licensed.   [name], NP 07/27/2024, 11:38 AM [facility]

## 2024-07-27 NOTE — Telephone Encounter (Signed)
 Ok per Damien Braver, NP to add pt on today for preop clearance. Med rec and consent are done.

## 2024-07-27 NOTE — Progress Notes (Signed)
 Virtual Visit via Telephone Note   Because of Arthur Church co-morbid illnesses, he is at least at moderate risk for complications without adequate follow up.  This format is felt to be most appropriate for this patient at this time.  Due to technical limitations with video connection (technology), today's appointment will be conducted as an audio only telehealth visit, and SOLAN VOSLER verbally agreed to proceed in this manner.   All issues noted in this document were discussed and addressed.  No physical exam could be performed with this format.  Evaluation Performed:  Preoperative cardiovascular risk assessment _____________   Date:  07/27/2024   Patient ID:  Arthur Church, DOB [birth year], MRN [id number] Patient Location:  Home Provider location:   Office  Primary Care Provider:  Tammy Tari ONEIDA DEVONNA Primary Cardiologist:  Vinie JAYSON Maxcy, MD  Chief Complaint / Patient Profile   68 y.o. y/o male with a h/o CAD s/p CABG x 2 (LIMA-LAD, SVG-RCA) in 03/2022, paroxysmal atrial fibrillation/atrial flutter, heart failure with recovered EF, hypertension, hyperlipidemia, bilateral venous insufficiency, prediabetes, COPD, and alcohol use who is pending BILATERAL UPPER EYELID BLEPHAROPTOSIS REPAIR AND RIGHT ORBITOTOMY FOR PROLAPSED ORBITAL FAT on 07/31/2024 with Dr. Renzo Zaldivar of [facility] and presents today for telephonic preoperative cardiovascular risk assessment.  History of Present Illness    Arthur Church is a 68 y.o. male who presents via audio/video conferencing for a telehealth visit today.  Pt was last seen in cardiology clinic on 02/18/2024 by Dr. Maxcy. At that time CAMMERON GREIS was doing well.  The patient is now pending procedure as outlined above. Since his last visit, he has been stable from a cardiac standpoint.   He denies chest pain, palpitations, dyspnea, pnd, orthopnea, n, v, dizziness, syncope, edema, weight gain, or early satiety. All other systems reviewed  and are otherwise negative except as noted above.   Past Medical History    Past Medical History:  Diagnosis Date   Anxiety    Chronic systolic CHF (congestive heart failure) (HCC)    a. 2D ECHO (03/06/15) EF 20-25%, diffuse HK. Asc aortic diameter: 40mm. Mild LA dilation, mild RV dilation. Mild RV systolic dysfunction   b. LifeVest placed on 02/2015 admissoin    Coronary artery disease    a. 70% LAD lesion   Dysrhythmia    Elevated TSH    ETOH abuse    Hypertension    Morbid obesity (HCC)    a. BMI ~46   Persistent atrial fibrillation (HCC)    a. newly dx 02/2015 admission. s/p successful TEE/DCCV  b. on Eliquis    Pre-diabetes    a. HgA1c 6.1   Past Surgical History:  Procedure Laterality Date   ANAL FISTULOTOMY N/A 03/24/2024   Procedure: ANAL FISTULA REPAIR - LIFT (Ligation of Intersphincteric fistula tract);  Surgeon: Sheldon Standing, MD;  Location: [facility] ORS;  Service: General;  Laterality: N/A;  REPAIR OF PERIRECTAL, POSSIBLE HEMORRHOIDECTOMY   ATRIAL FIBRILLATION ABLATION N/A 01/28/2023   Procedure: ATRIAL FIBRILLATION ABLATION;  Surgeon: Inocencio Soyla Lunger, MD;  Location: [facility] INVASIVE CV LAB;  Service: Cardiovascular;  Laterality: N/A;   CARDIAC CATHETERIZATION N/A 04/26/2015   Procedure: Left Heart Cath and Coronary Angiography;  Surgeon: Victory LELON Sharps, MD;  Location: [facility] INVASIVE CV LAB;  Service: Cardiovascular;  Laterality: N/A;   CARDIOVERSION N/A 03/06/2015   Procedure: CARDIOVERSION;  Surgeon: Ezra GORMAN Shuck, MD;  Location: [facility] ENDOSCOPY;  Service: Cardiovascular;  Laterality: N/A;   CARDIOVERSION N/A  06/02/2022   Procedure: CARDIOVERSION;  Surgeon: Alveta Aleene PARAS, MD;  Location: [facility] ENDOSCOPY;  Service: Cardiovascular;  Laterality: N/A;   CARDIOVERSION N/A 07/14/2022   Procedure: CARDIOVERSION;  Surgeon: Francyne Headland, MD;  Location: [facility] ENDOSCOPY;  Service: Cardiovascular;  Laterality: N/A;   CATARACT EXTRACTION, BILATERAL Bilateral    COLONOSCOPY WITH PROPOFOL  N/A  02/24/2024   Procedure: COLONOSCOPY WITH PROPOFOL ;  Surgeon: Shila Gustav GAILS, MD;  Location: [facility] ENDOSCOPY;  Service: Gastroenterology;  Laterality: N/A;   CORONARY ANGIOGRAPHY N/A 03/18/2022   Procedure: CORONARY ANGIOGRAPHY;  Surgeon: Verlin Lonni BIRCH, MD;  Location: [facility] INVASIVE CV LAB;  Service: Cardiovascular;  Laterality: N/A;   CORONARY ARTERY BYPASS GRAFT N/A 03/24/2022   Procedure: CORONARY ARTERY BYPASS GRAFTING (CABG) TIMES TWO USING LEFT INTERNAL MAMMARY ARTERY AND ENDOSCOPICALLY HARVESTED RIGHT GREATER SAPHENOUS VEIN;  Surgeon: Obadiah Coy, MD;  Location: [facility] OR;  Service: Open Heart Surgery;  Laterality: N/A;   ENDOSCOPIC MUCOSAL RESECTION  02/24/2024   Procedure: RESECTION, MUCOSAL LESION, GI TRACT, ENDOSCOPIC;  Surgeon: Shila Gustav GAILS, MD;  Location: [facility] ENDOSCOPY;  Service: Gastroenterology;;   ENDOVEIN HARVEST OF GREATER SAPHENOUS VEIN Right 03/24/2022   Procedure: ENDOVEIN HARVEST OF GREATER SAPHENOUS VEIN;  Surgeon: Obadiah Coy, MD;  Location: [facility] OR;  Service: Open Heart Surgery;  Laterality: Right;   HEMORRHOID SURGERY  03/24/2024   Procedure: HEMORRHOIDECTOMY X2;  Surgeon: Sheldon Standing, MD;  Location: [facility] ORS;  Service: General;;   HERNIA REPAIR     LEFT HEART CATH N/A 03/16/2022   Procedure: Left Heart Cath;  Surgeon: Claudene Pacific, MD;  [facility]: [facility] INVASIVE CV LAB;  Service: Cardiovascular;  Laterality: N/A;   POLYPECTOMY  02/24/2024   Procedure: POLYPECTOMY;  Surgeon: Shila Gustav GAILS, MD;  Location: [facility] ENDOSCOPY;  Service: Gastroenterology;;   RECTAL EXAM UNDER ANESTHESIA N/A 03/24/2024   Procedure: HEMORRHOIDAL LIGATION & HEMORRHOIDOPEXY ANORECTAL EXAMINATION UNDER ANESTHESIA;  Surgeon: Sheldon Standing, MD;  Location: [facility] ORS;  Service: General;  Laterality: N/A;   TEE WITHOUT CARDIOVERSION N/A 03/06/2015   Procedure: TRANSESOPHAGEAL ECHOCARDIOGRAM (TEE);  Surgeon: Ezra GORMAN Shuck, MD;  Location: [facility] ENDOSCOPY;  Service: Cardiovascular;  Laterality:  N/A;   TEE WITHOUT CARDIOVERSION N/A 05/02/2015   Procedure: TRANSESOPHAGEAL ECHOCARDIOGRAM (TEE);  Surgeon: Redell GORMAN Shallow, MD;  Location: [facility] ENDOSCOPY;  Service: Cardiovascular;  Laterality: N/A;   TEE WITHOUT CARDIOVERSION N/A 03/24/2022   Procedure: TRANSESOPHAGEAL ECHOCARDIOGRAM (TEE);  Surgeon: Obadiah Coy, MD;  Location: [facility] OR;  Service: Open Heart Surgery;  Laterality: N/A;    Allergies  No Known Allergies  Home Medications    Prior to Admission medications   Medication Sig Start Date End Date Taking? Authorizing Provider  acetaminophen  (TYLENOL ) 500 MG tablet Take 500 mg by mouth every 6 (six) hours as needed for mild pain, moderate pain or headache.    [provider]  albuterol  (VENTOLIN  HFA) 108 (90 Base) MCG/ACT inhaler Inhale 2 puffs into the lungs every 6 (six) hours as needed for wheezing or shortness of breath. 04/19/24   Meade Verdon GORMAN, MD  ALPRAZolam  (XANAX ) 0.5 MG tablet TAKE 1 TABLET BY MOUTH THREE TIMES DAILYAS NEEDED FOR ANXIETY 08/04/19   Curtis Debby PARAS, MD  apixaban  (ELIQUIS ) 5 MG TABS tablet Take 1 tablet (5 mg total) by mouth 2 (two) times daily. 06/15/23   Milford, Harlene HERO, FNP  atorvastatin  (LIPITOR ) 80 MG tablet Take 1 tablet (80 mg total) by mouth daily. 03/20/24   Hilty, Vinie BROCKS, MD  carvedilol  (COREG ) 6.25 MG tablet TAKE 1  TABLET(6.25 MG) BY MOUTH TWICE DAILY WITH A MEAL 05/29/24   Hilty, Vinie BROCKS, MD  furosemide  (LASIX ) 40 MG tablet Take 1 tablet (40 mg total) by mouth 2 (two) times daily. 02/18/24   Hilty, Vinie BROCKS, MD  hydrocortisone 2.5 % cream Apply 1 Application topically daily as needed (rash, itching, irritation).    [provider]  loratadine  (CLARITIN ) 10 MG tablet Take 10 mg by mouth daily.    [provider]  Magnesium  400 MG TABS Take 400 mg by mouth 2 (two) times a day. 07/14/19   Hilty, Vinie BROCKS, MD  montelukast  (SINGULAIR ) 10 MG tablet Take 10 mg by mouth at bedtime. 01/14/22   [provider]   oxyCODONE -acetaminophen  (PERCOCET) 10-325 MG tablet Take 1 tablet by mouth every 6 (six) hours as needed for pain. 03/24/24   Sheldon Standing, MD  OXYGEN  Inhale 2-3 L into the lungs at bedtime.    [provider]  pantoprazole  (PROTONIX ) 40 MG tablet Take 1 tablet (40 mg total) by mouth daily. 11/30/19   Curtis Debby PARAS, MD  sacubitril -valsartan  (ENTRESTO ) 24-26 MG Take 1 tablet by mouth 2 (two) times daily. 04/28/24   Mona Vinie BROCKS, MD  spironolactone  (ALDACTONE ) 25 MG tablet TAKE 1/2 TABLET(12.5 MG) BY MOUTH DAILY 04/04/24   Mona Vinie BROCKS, MD  TRELEGY ELLIPTA  100-62.5-25 MCG/ACT AEPB Inhale 1 puff into the lungs daily. 04/19/24   Meade Verdon RAMAN, MD    Physical Exam    Vital Signs:  Garnette BROCKS Durand does not have vital signs available for review today.  Given telephonic nature of communication, physical exam is limited. AAOx3. NAD. Normal affect.  Speech and respirations are unlabored.  Accessory Clinical Findings    None  Assessment & Plan    1.  Preoperative Cardiovascular Risk Assessment:  According to the Revised Cardiac Risk Index (RCRI), his Perioperative Risk of Major Cardiac Event is (%): 6.6. His Functional Capacity in METs is: 6.61 according to the Duke Activity Status Index (DASI). Therefore, based on ACC/AHA guidelines, patient would be at acceptable risk for the planned procedure without further cardiovascular testing.  The patient was advised that if he develops new symptoms prior to surgery to contact our office to arrange for a follow-up visit, and he verbalized understanding.  Per office protocol, patient can hold Eliquis  for 2 days prior to procedure. Please resume Eliquis  as soon as possible postprocedure, at the discretion of the surgeon.    A copy of this note will be routed to requesting surgeon.  Time:   Today, I have spent 5 minutes with the patient with telehealth technology discussing medical history, symptoms, and management plan.      Damien BROCKS Braver, NP  07/27/2024, 3:04 PM

## 2024-07-27 NOTE — Telephone Encounter (Signed)
 Ok per Damien Braver, NP to add pt on today for preop clearance. Med rec and consent are done.     Patient Consent for Virtual Visit        Arthur Church has provided verbal consent on 07/27/2024 for a virtual visit (video or telephone).   CONSENT FOR VIRTUAL VISIT FOR:  Arthur Church  By participating in this virtual visit I agree to the following:  I hereby voluntarily request, consent and authorize [facility] and its employed or contracted physicians, physician assistants, nurse practitioners or other licensed health care professionals (the Practitioner), to provide me with telemedicine health care services (the "Services) as deemed necessary by the treating Practitioner. I acknowledge and consent to receive the Services by the Practitioner via telemedicine. I understand that the telemedicine visit will involve communicating with the Practitioner through live audiovisual communication technology and the disclosure of certain medical information by electronic transmission. I acknowledge that I have been given the opportunity to request an in-person assessment or other available alternative prior to the telemedicine visit and am voluntarily participating in the telemedicine visit.  I understand that I have the right to withhold or withdraw my consent to the use of telemedicine in the course of my care at any time, without affecting my right to future care or treatment, and that the Practitioner or I may terminate the telemedicine visit at any time. I understand that I have the right to inspect all information obtained and/or recorded in the course of the telemedicine visit and may receive copies of available information for a reasonable fee.  I understand that some of the potential risks of receiving the Services via telemedicine include:  Delay or interruption in medical evaluation due to technological equipment failure or disruption; Information transmitted may not be sufficient (e.g.  poor resolution of images) to allow for appropriate medical decision making by the Practitioner; and/or  In rare instances, security protocols could fail, causing a breach of personal health information.  Furthermore, I acknowledge that it is my responsibility to provide information about my medical history, conditions and care that is complete and accurate to the best of my ability. I acknowledge that Practitioner's advice, recommendations, and/or decision may be based on factors not within their control, such as incomplete or inaccurate data provided by me or distortions of diagnostic images or specimens that may result from electronic transmissions. I understand that the practice of medicine is not an exact science and that Practitioner makes no warranties or guarantees regarding treatment outcomes. I acknowledge that a copy of this consent can be made available to me via my patient portal [facility] MyChart), or I can request a printed copy by calling the office of [facility].    I understand that my insurance will be billed for this visit.   I have read or had this consent read to me. I understand the contents of this consent, which adequately explains the benefits and risks of the Services being provided via telemedicine.  I have been provided ample opportunity to ask questions regarding this consent and the Services and have had my questions answered to my satisfaction. I give my informed consent for the services to be provided through the use of telemedicine in my medical care

## 2024-08-23 ENCOUNTER — Encounter: Payer: Self-pay | Admitting: Internal Medicine

## 2024-08-31 ENCOUNTER — Other Ambulatory Visit: Payer: Self-pay

## 2024-08-31 ENCOUNTER — Telehealth: Payer: Self-pay

## 2024-08-31 DIAGNOSIS — D126 Benign neoplasm of colon, unspecified: Secondary | ICD-10-CM

## 2024-08-31 DIAGNOSIS — K625 Hemorrhage of anus and rectum: Secondary | ICD-10-CM

## 2024-08-31 NOTE — Telephone Encounter (Signed)
-----   Message from [facility] sent at 08/30/2024  4:38 PM EDT ----- Regarding: RE: OK for f/u colonoscopy Thank you for letting me know. Ro,  can you please help with scheduling him for surveillance colonoscopy at hospital endoscopy next available appointment with me, he has history of advanced adenomatous colon polyps.  Thank you ----- Message ----- From: Sheldon Standing, MD Sent: 08/22/2024  12:27 PM EDT To: Kavitha Nandigam V, MD Subject: OK for f/u colonoscopy                         KVN: This is a patient that had a intersphincteric fistula and hemorrhoids that I operated on in the spring.  Took a while for his external wound to close down but is fully closed.  He tells me that you had removed an ascending colon polyp but were worried about recurrence and recommended follow-up colonoscopy in a few months.  I think we delayed things until everything had healed up from my surgery.  Because everything has healed up, I am fine with proceeding.  Patient wanted to make sure you knew.  Thanks.    Your regularly humbled and sometimes obedient colleague,  SG [phone]

## 2024-08-31 NOTE — Telephone Encounter (Signed)
 Request for surgical clearance:     Endoscopy Procedure  What type of surgery is being performed?     Colonoscopy   When is this surgery scheduled?     10/26/24  What type of clearance is required ?   Pharmacy  Are there any medications that need to be held prior to surgery and how long? Eliquis  x 2 days   Practice name and name of physician performing surgery?      [facility]  What is your office phone and fax number?      Phone- [phone]  Fax- [phone]  Anesthesia type (None, local, MAC, general) ?       MAC  Please route your response to Blondie Barks, CMA

## 2024-08-31 NOTE — Telephone Encounter (Signed)
 This is an FYI- If the patient calls back. I am not here the next 2 days. I had to leave patient a detailed message with instructions.   Patient has been scheduled for 10/26/24 @ [facility] for colonoscopy with Dr.Nandigam,( that was the next available date for Dr.Nandigam). All instructions have been sent to patient via my chart. I did not re-send Suflave  to the pharmacy as patient should still have prep kit from previously being scheduled. Clearance for Eliquis  has also been re-sent to Cardiology asking for clearance to hold Eliquis  x2 days prior to procedure.

## 2024-09-01 ENCOUNTER — Telehealth: Payer: Self-pay | Admitting: Gastroenterology

## 2024-09-01 NOTE — Telephone Encounter (Signed)
 Spoke with the patient. He needs any day other than a Thursday.  He takes care of his grandchildren on Thursdays. For now he agrees to leave it scheduled for 10/26/24. Will try to move the case if possible to something sooner.

## 2024-09-01 NOTE — Telephone Encounter (Signed)
 Inbound call from patient stating he would like to reschedule colonoscopy he has on 10/26/24 at [facility] due to Thursdays not being a good day for him and patient also stated he would like to know if it can be scheduled sooner than November  Requesting a call back  Please advise  Thank you

## 2024-09-04 NOTE — Telephone Encounter (Signed)
 I Called patient and informed him he is ok to hold Eliquis  2 days before his hospital procedure scheduled in November, I told him to write it on his calendar so he will not forget   He said he understood and will call back as needed

## 2024-09-04 NOTE — Telephone Encounter (Signed)
 Patient with diagnosis of Afib on Eliquis  for anticoagulation.    What type of surgery is being performed?    Colonoscopy    When is this surgery scheduled?     10/26/24   CHA2DS2-VASc Score = 5   This indicates a 7.2% annual risk of stroke. The patient's score is based upon: CHF History: 1 HTN History: 1 Diabetes History: 1 Stroke History: 0 Vascular Disease History: 1 Age Score: 1 Gender Score: 0   CrCl 175 ml/min Platelet count 273 K   Patient has not had an Afib/aflutter ablation within the last 3 months or DCCV within the last 30 days  Per office protocol, patient can hold Eliquis  for 2 days prior to procedure.   Patient will not need bridging with Lovenox  (enoxaparin ) around procedure.  **This guidance is not considered finalized until pre-operative APP has relayed final recommendations.**

## 2024-09-05 NOTE — Telephone Encounter (Signed)
   Patient Name: Arthur Church  DOB: 12-Jun-1956 MRN: [id number]  Primary Cardiologist: Vinie JAYSON Maxcy, MD  Chart reviewed as part of pre-operative protocol coverage. Patient is scheduled to have a colonoscopy in 10/2024, and Cardiology was asked to give recommendations for holding Eliquis .   Per pharmacy and office protocol: Patient can hold Eliquis  for 2 days prior to procedure. Patient will not need bridging with Lovenox  (enoxaparin ) around procedure.  I will route this recommendation to the requesting party and remove from pre-op pool.  Please call with questions.  Penina Reisner E Aswad Wandrey, PA-C 09/05/2024, 7:58 AM

## 2024-09-14 ENCOUNTER — Other Ambulatory Visit: Payer: Self-pay

## 2024-09-14 NOTE — Telephone Encounter (Signed)
 Spoke with the patient. Offered  Tuesday 10/10/24 for his colonoscopy. The patient accepts the date. He then says he is in a kind of emergency situation and I need to go. Please send the information to My Chart. New instructions sent to My Chart. Will print these to be mailed as well. Anti coagulation clearance has already been obtained for Eliquis .

## 2024-09-21 ENCOUNTER — Other Ambulatory Visit: Payer: Self-pay | Admitting: Internal Medicine

## 2024-10-04 ENCOUNTER — Encounter (HOSPITAL_COMMUNITY): Payer: Self-pay | Admitting: Gastroenterology

## 2024-10-04 NOTE — Progress Notes (Signed)
 Pre op call Arthur Church   PCP- Tammy PA Cardiologist-Camnitz MD  PulmonologistGLENWOOD Gore MD  EKG-03/24/24 Echo-04/26/24 Cath-03/18/22 Stress-n/a ICD/PM-n/a GLP1-Ozempic hold 1 week last dose 10/19 Blood Thinner-Eliquis  2 day hold   History: CHF,CAD,HTN,Afib,CABGx2, Pre DM. Patient last saw his cardiologist 8/14 and at that appt they gave clearance for an eye surgery.  He saw his pulm MD 04/19/24 with f/u 6 months. He does use 3L 02 at night, will bring portable tank, endorses no heart or breathing issues, no equipment use. Anesthesia Review- Yes- approved to proceed

## 2024-10-04 NOTE — Progress Notes (Signed)
 Attempted to obtain medical history for pre op call via telephone, unable to reach at this time. HIPAA compliant voicemail message left requesting return call to pre surgical testing department.

## 2024-10-06 ENCOUNTER — Telehealth: Payer: Self-pay

## 2024-10-06 NOTE — Telephone Encounter (Signed)
 Procedure:COLON Procedure date: 10/10/24 Procedure location: [facility] Arrival Time: 9:46 Spoke with the patient Y/N: Y Any prep concerns? N  Has the patient obtained the prep from the pharmacy ? Y Do you have a care partner and transportation: Y Any additional concerns? N

## 2024-10-10 ENCOUNTER — Ambulatory Visit (HOSPITAL_COMMUNITY): Admitting: Certified Registered Nurse Anesthetist

## 2024-10-10 ENCOUNTER — Encounter (HOSPITAL_COMMUNITY)
Admission: RE | Disposition: A | Discharge status: Home / Self Care | Payer: Self-pay | Source: Home / Self Care | Attending: Gastroenterology

## 2024-10-10 ENCOUNTER — Ambulatory Visit (HOSPITAL_COMMUNITY)
Admission: RE | Admit: 2024-10-10 | Discharge: 2024-10-10 | Disposition: A | Discharge status: Home / Self Care | Attending: Gastroenterology | Admitting: Gastroenterology

## 2024-10-10 ENCOUNTER — Encounter (HOSPITAL_COMMUNITY): Payer: Self-pay | Admitting: Gastroenterology

## 2024-10-10 ENCOUNTER — Other Ambulatory Visit: Payer: Self-pay

## 2024-10-10 DIAGNOSIS — G4730 Sleep apnea, unspecified: Secondary | ICD-10-CM | POA: Insufficient documentation

## 2024-10-10 DIAGNOSIS — I110 Hypertensive heart disease with heart failure: Secondary | ICD-10-CM | POA: Insufficient documentation

## 2024-10-10 DIAGNOSIS — J449 Chronic obstructive pulmonary disease, unspecified: Secondary | ICD-10-CM | POA: Diagnosis not present

## 2024-10-10 DIAGNOSIS — K648 Other hemorrhoids: Secondary | ICD-10-CM | POA: Diagnosis not present

## 2024-10-10 DIAGNOSIS — E119 Type 2 diabetes mellitus without complications: Secondary | ICD-10-CM | POA: Insufficient documentation

## 2024-10-10 DIAGNOSIS — I252 Old myocardial infarction: Secondary | ICD-10-CM | POA: Diagnosis not present

## 2024-10-10 DIAGNOSIS — Z87891 Personal history of nicotine dependence: Secondary | ICD-10-CM | POA: Diagnosis not present

## 2024-10-10 DIAGNOSIS — I2510 Atherosclerotic heart disease of native coronary artery without angina pectoris: Secondary | ICD-10-CM | POA: Insufficient documentation

## 2024-10-10 DIAGNOSIS — E66813 Obesity, class 3: Secondary | ICD-10-CM | POA: Diagnosis not present

## 2024-10-10 DIAGNOSIS — I5022 Chronic systolic (congestive) heart failure: Secondary | ICD-10-CM | POA: Insufficient documentation

## 2024-10-10 DIAGNOSIS — K219 Gastro-esophageal reflux disease without esophagitis: Secondary | ICD-10-CM | POA: Diagnosis not present

## 2024-10-10 DIAGNOSIS — K5730 Diverticulosis of large intestine without perforation or abscess without bleeding: Secondary | ICD-10-CM | POA: Insufficient documentation

## 2024-10-10 DIAGNOSIS — D122 Benign neoplasm of ascending colon: Secondary | ICD-10-CM | POA: Insufficient documentation

## 2024-10-10 DIAGNOSIS — K644 Residual hemorrhoidal skin tags: Secondary | ICD-10-CM | POA: Insufficient documentation

## 2024-10-10 DIAGNOSIS — D123 Benign neoplasm of transverse colon: Secondary | ICD-10-CM | POA: Insufficient documentation

## 2024-10-10 DIAGNOSIS — Z1211 Encounter for screening for malignant neoplasm of colon: Secondary | ICD-10-CM | POA: Diagnosis present

## 2024-10-10 DIAGNOSIS — Z951 Presence of aortocoronary bypass graft: Secondary | ICD-10-CM | POA: Insufficient documentation

## 2024-10-10 DIAGNOSIS — D126 Benign neoplasm of colon, unspecified: Secondary | ICD-10-CM

## 2024-10-10 DIAGNOSIS — Z6841 Body Mass Index (BMI) 40.0 and over, adult: Secondary | ICD-10-CM | POA: Insufficient documentation

## 2024-10-10 DIAGNOSIS — D120 Benign neoplasm of cecum: Secondary | ICD-10-CM

## 2024-10-10 LAB — GLUCOSE, CAPILLARY: Glucose-Capillary: 105 mg/dL — ABNORMAL HIGH (ref 70–99)

## 2024-10-10 SURGERY — COLONOSCOPY
Anesthesia: Monitor Anesthesia Care

## 2024-10-10 MED ORDER — PROPOFOL 500 MG/50ML IV EMUL
INTRAVENOUS | Status: AC
  Filled 2024-10-10: qty 100

## 2024-10-10 MED ORDER — ONDANSETRON HCL 4 MG/2ML IJ SOLN
INTRAMUSCULAR | Status: DC | PRN
  Administered 2024-10-10: 4 mg via INTRAVENOUS

## 2024-10-10 MED ORDER — SODIUM CHLORIDE 0.9 % IV SOLN: INTRAVENOUS | Status: DC

## 2024-10-10 MED ORDER — PROPOFOL 500 MG/50ML IV EMUL
INTRAVENOUS | Status: DC | PRN
Start: 2024-10-10 — End: 2024-10-10
  Administered 2024-10-10 (×2): 20 mg via INTRAVENOUS
  Administered 2024-10-10: 180 ug/kg/min via INTRAVENOUS

## 2024-10-10 MED ORDER — PHENYLEPHRINE HCL (PRESSORS) 10 MG/ML IV SOLN
INTRAVENOUS | Status: DC | PRN
Start: 2024-10-10 — End: 2024-10-10
  Administered 2024-10-10 (×2): 80 ug via INTRAVENOUS

## 2024-10-10 MED ORDER — LIDOCAINE HCL (PF) 2 % IJ SOLN
INTRAMUSCULAR | Status: DC | PRN
  Administered 2024-10-10: 40 mg via INTRADERMAL

## 2024-10-10 NOTE — Anesthesia Procedure Notes (Signed)
 Procedure Name: MAC Date/Time: 10/10/2024 11:31 AM  Performed by: Joshua Vernell BROCKS, CRNAPre-anesthesia Checklist: Patient identified, Emergency Drugs available, Suction available and Patient being monitored Patient Re-evaluated:Patient Re-evaluated prior to induction Oxygen  Delivery Method: Simple face mask Preoxygenation: Pre-oxygenation with 100% oxygen  Placement Confirmation: positive ETCO2 and breath sounds checked- equal and bilateral Dental Injury: Teeth and Oropharynx as per pre-operative assessment

## 2024-10-10 NOTE — H&P (Signed)
  Gastroenterology History and Physical   Primary Care Physician:  Tammy Tari ONEIDA DEVONNA   Reason for Procedure:  History of adenomatous colon polyps  Plan:    Surveillance colonoscopy with possible interventions as needed     HPI: Arthur Church is a very pleasant 68 y.o. male here for surveillance colonoscopy. Denies any nausea, vomiting, abdominal pain, melena or bright red blood per rectum  The risks and benefits as well as alternatives of endoscopic procedure(s) have been discussed and reviewed. All questions answered. The patient agrees to proceed.    Past Medical History:  Diagnosis Date   Anxiety    Chronic systolic CHF (congestive heart failure) (HCC)    a. 2D ECHO (03/06/15) EF 20-25%, diffuse HK. Asc aortic diameter: 40mm. Mild LA dilation, mild RV dilation. Mild RV systolic dysfunction   b. LifeVest placed on 02/2015 admissoin    Coronary artery disease    a. 70% LAD lesion   Dysrhythmia    Elevated TSH    ETOH abuse    Hypertension    Morbid obesity (HCC)    a. BMI ~46   Persistent atrial fibrillation (HCC)    a. newly dx 02/2015 admission. s/p successful TEE/DCCV  b. on Eliquis    Pre-diabetes    a. HgA1c 6.1    Past Surgical History:  Procedure Laterality Date   ANAL FISTULOTOMY N/A 03/24/2024   Procedure: ANAL FISTULA REPAIR - LIFT (Ligation of Intersphincteric fistula tract);  Surgeon: Sheldon Standing, MD;  Location: [facility];  Service: General;  Laterality: N/A;  REPAIR OF PERIRECTAL, POSSIBLE HEMORRHOIDECTOMY   ATRIAL FIBRILLATION ABLATION N/A 01/28/2023   Procedure: ATRIAL FIBRILLATION ABLATION;  Surgeon: Inocencio Soyla Lunger, MD;  Location: [facility] INVASIVE CV LAB;  Service: Cardiovascular;  Laterality: N/A;   CARDIAC CATHETERIZATION N/A 04/26/2015   Procedure: Left Heart Cath and Coronary Angiography;  Surgeon: Victory LELON Sharps, MD;  Location: [facility] INVASIVE CV LAB;  Service: Cardiovascular;  Laterality: N/A;   CARDIOVERSION N/A 03/06/2015   Procedure:  CARDIOVERSION;  Surgeon: Ezra GORMAN Shuck, MD;  Location: [facility] ENDOSCOPY;  Service: Cardiovascular;  Laterality: N/A;   CARDIOVERSION N/A 06/02/2022   Procedure: CARDIOVERSION;  Surgeon: Alveta Aleene PARAS, MD;  Location: [facility] ENDOSCOPY;  Service: Cardiovascular;  Laterality: N/A;   CARDIOVERSION N/A 07/14/2022   Procedure: CARDIOVERSION;  Surgeon: Francyne Headland, MD;  Location: [facility] ENDOSCOPY;  Service: Cardiovascular;  Laterality: N/A;   CATARACT EXTRACTION, BILATERAL Bilateral    COLONOSCOPY WITH PROPOFOL  N/A 02/24/2024   Procedure: COLONOSCOPY WITH PROPOFOL ;  Surgeon: Shila Gustav GAILS, MD;  Location: [facility] ENDOSCOPY;  Service: Gastroenterology;  Laterality: N/A;   CORONARY ANGIOGRAPHY N/A 03/18/2022   Procedure: CORONARY ANGIOGRAPHY;  Surgeon: Verlin Lonni BIRCH, MD;  Location: [facility] INVASIVE CV LAB;  Service: Cardiovascular;  Laterality: N/A;   CORONARY ARTERY BYPASS GRAFT N/A 03/24/2022   Procedure: CORONARY ARTERY BYPASS GRAFTING (CABG) TIMES TWO USING LEFT INTERNAL MAMMARY ARTERY AND ENDOSCOPICALLY HARVESTED RIGHT GREATER SAPHENOUS VEIN;  Surgeon: Obadiah Coy, MD;  Location: [facility] OR;  Service: Open Heart Surgery;  Laterality: N/A;   ENDOSCOPIC MUCOSAL RESECTION  02/24/2024   Procedure: RESECTION, MUCOSAL LESION, GI TRACT, ENDOSCOPIC;  Surgeon: Shila Gustav GAILS, MD;  Location: [facility] ENDOSCOPY;  Service: Gastroenterology;;   ENDOVEIN HARVEST OF GREATER SAPHENOUS VEIN Right 03/24/2022   Procedure: ENDOVEIN HARVEST OF GREATER SAPHENOUS VEIN;  Surgeon: Obadiah Coy, MD;  Location: [facility] OR;  Service: Open Heart Surgery;  Laterality: Right;   HEMORRHOID SURGERY  03/24/2024   Procedure: HEMORRHOIDECTOMY X2;  Surgeon: Sheldon Standing, MD;  Location: [facility];  Service: General;;   HERNIA REPAIR     LEFT HEART CATH N/A 03/16/2022   Procedure: Left Heart Cath;  Surgeon: Claudene Pacific, MD;  Location: [facility] INVASIVE CV LAB;  Service: Cardiovascular;  Laterality: N/A;   POLYPECTOMY  02/24/2024   Procedure:  POLYPECTOMY;  Surgeon: Shila Gustav GAILS, MD;  Location: [facility] ENDOSCOPY;  Service: Gastroenterology;;   RECTAL EXAM UNDER ANESTHESIA N/A 03/24/2024   Procedure: HEMORRHOIDAL LIGATION & HEMORRHOIDOPEXY ANORECTAL EXAMINATION UNDER ANESTHESIA;  Surgeon: Sheldon Standing, MD;  Location: [facility]S;  Service: General;  Laterality: N/A;   TEE WITHOUT CARDIOVERSION N/A 03/06/2015   Procedure: TRANSESOPHAGEAL ECHOCARDIOGRAM (TEE);  Surgeon: Ezra GORMAN Shuck, MD;  Location: [facility] ENDOSCOPY;  Service: Cardiovascular;  Laterality: N/A;   TEE WITHOUT CARDIOVERSION N/A 05/02/2015   Procedure: TRANSESOPHAGEAL ECHOCARDIOGRAM (TEE);  Surgeon: Redell GORMAN Shallow, MD;  Location: [facility] ENDOSCOPY;  Service: Cardiovascular;  Laterality: N/A;   TEE WITHOUT CARDIOVERSION N/A 03/24/2022   Procedure: TRANSESOPHAGEAL ECHOCARDIOGRAM (TEE);  Surgeon: Obadiah Coy, MD;  Location: [facility] OR;  Service: Open Heart Surgery;  Laterality: N/A;    Prior to Admission medications   Medication Sig Start Date End Date Taking? Authorizing Provider  Semaglutide,0.25 or 0.5MG /DOS, (OZEMPIC, 0.25 OR 0.5 MG/DOSE,) 2 MG/1.5ML SOPN Inject 0.25 mg into the skin once a week.   Yes [provider]  acetaminophen  (TYLENOL ) 500 MG tablet Take 500 mg by mouth every 6 (six) hours as needed for mild pain, moderate pain or headache.    [provider]  albuterol  (VENTOLIN  HFA) 108 (90 Base) MCG/ACT inhaler Inhale 2 puffs into the lungs every 6 (six) hours as needed for wheezing or shortness of breath. 04/19/24   Meade Verdon GORMAN, MD  ALPRAZolam  (XANAX ) 0.5 MG tablet TAKE 1 TABLET BY MOUTH THREE TIMES DAILYAS NEEDED FOR ANXIETY 08/04/19   Curtis Debby PARAS, MD  apixaban  (ELIQUIS ) 5 MG TABS tablet Take 1 tablet (5 mg total) by mouth 2 (two) times daily. 06/15/23   Milford, Harlene HERO, FNP  atorvastatin  (LIPITOR ) 80 MG tablet Take 1 tablet (80 mg total) by mouth daily. 03/20/24   Hilty, Vinie BROCKS, MD  carvedilol  (COREG ) 6.25 MG tablet TAKE 1 TABLET(6.25 MG)  BY MOUTH TWICE DAILY WITH A MEAL 05/29/24   Hilty, Vinie BROCKS, MD  furosemide  (LASIX ) 40 MG tablet TAKE 1 TABLET(40 MG) BY MOUTH TWICE DAILY 09/22/24   Hilty, Vinie BROCKS, MD  hydrocortisone 2.5 % cream Apply 1 Application topically daily as needed (rash, itching, irritation).    [provider]  loratadine  (CLARITIN ) 10 MG tablet Take 10 mg by mouth daily.    [provider]  Magnesium  400 MG TABS Take 400 mg by mouth 2 (two) times a day. 07/14/19   Hilty, Vinie BROCKS, MD  montelukast  (SINGULAIR ) 10 MG tablet Take 10 mg by mouth at bedtime. 01/14/22   [provider]  oxyCODONE -acetaminophen  (PERCOCET) 10-325 MG tablet Take 1 tablet by mouth every 6 (six) hours as needed for pain. 03/24/24   Sheldon Standing, MD  OXYGEN  Inhale 2-3 L into the lungs at bedtime.    [provider]  pantoprazole  (PROTONIX ) 40 MG tablet Take 1 tablet (40 mg total) by mouth daily. 11/30/19   Curtis Debby PARAS, MD  sacubitril -valsartan  (ENTRESTO ) 24-26 MG Take 1 tablet by mouth 2 (two) times daily. 04/28/24   Mona Vinie BROCKS, MD  spironolactone  (ALDACTONE ) 25 MG tablet TAKE 1/2 TABLET(12.5 MG) BY MOUTH DAILY 04/04/24   Hilty,  Vinie BROCKS, MD  TRELEGY ELLIPTA  100-62.5-25 MCG/ACT AEPB Inhale 1 puff into the lungs daily. 04/19/24   Meade Verdon RAMAN, MD    No current facility-administered medications for this encounter.    Allergies as of 08/31/2024   (No Known Allergies)    Family History  Problem Relation Age of Onset   Heart attack Mother    Breast cancer Mother    Uterine cancer Sister    Sleep apnea Brother    Sleep apnea Brother    Heart attack Maternal Grandfather    Colon cancer Neg Hx    Liver cancer Neg Hx    Stomach cancer Neg Hx    Esophageal cancer Neg Hx     Social History   Socioeconomic History   Marital status: Widowed    Spouse name: Not on file   Number of children: 1   Years of education: Not on file   Highest education level: Not on file  Occupational  History   Occupation: security guard  Tobacco Use   Smoking status: Former    Current packs/day: 0.00    Types: Cigarettes    Quit date: 01/02/2013    Years since quitting: 11.7   Smokeless tobacco: Never   Tobacco comments:    smokes about a week  Vaping Use   Vaping status: Never Used  Substance and Sexual Activity   Alcohol use: Yes    Comment: Occassional   Drug use: No   Sexual activity: Not Currently  Other Topics Concern   Not on file  Social History Narrative   Mr Cruise is a 68 year old male 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 He reports he is independent with all his care needs and transportation to medical appointments   He is stating he is to either retire or go out on disability soon.    His daughter, Mylene is a SW per pt   Social Drivers of Corporate Investment Banker Strain: Low Risk  (04/20/2023)   Overall Financial Resource Strain (CARDIA)    Difficulty of Paying Living Expenses: Not hard at all  Food Insecurity: Low Risk  (08/02/2024)   Received from [facility]   Hunger Vital Sign    Within the past 12 months, you worried that your food would run out before you got money to buy more: Never true    Within the past 12 months, the food you bought just didn't last and you didn't have money to get more. : Never true  Transportation Needs: No Transportation Needs (08/02/2024)   Received from [facility]    In the past 12 months, has lack of reliable transportation kept you from medical appointments, meetings, work or from getting things needed for daily living? : No  Physical Activity: Not on file  Stress: Not on file  Social Connections: Moderately Integrated (03/24/2024)   Social Connection and Isolation Panel    Frequency of Communication with Friends and Family: More than three times a week    Frequency of Social Gatherings with Friends and Family: More than three times a week    Attends Religious Services: More than 4  times per year    Active Member of [employer] or Organizations: Yes    Attends Banker Meetings: Never    Marital Status: Widowed  Intimate Partner Violence: Not At Risk (03/24/2024)   Humiliation, Afraid, Rape, and Kick questionnaire    Fear of Current or  Ex-Partner: No    Emotionally Abused: No    Physically Abused: No    Sexually Abused: No    Review of Systems:  All other review of systems negative except as mentioned in the HPI.  Physical Exam: Vital signs in last 24 hours: There were no vitals taken for this visit. General:   Alert, NAD Lungs:  Clear .   Heart:  Regular rate and rhythm Abdomen:  Soft, nontender and nondistended. Neuro/Psych:  Alert and cooperative. Normal mood and affect. A and O x 3  Reviewed labs, radiology imaging, old records and pertinent past GI work up     K. Veena Anthoni Geerts , MD [phone]

## 2024-10-10 NOTE — Discharge Instructions (Signed)

## 2024-10-10 NOTE — Transfer of Care (Signed)
 Immediate Anesthesia Transfer of Care Note  Patient: Arthur Church  Procedure(s) Performed: Procedure(s): COLONOSCOPY (N/A)  Patient Location: PACU  Anesthesia Type:MAC  Level of Consciousness: Patient easily awoken, comfortable, cooperative, following commands, responds to stimulation.   Airway & Oxygen  Therapy: Patient spontaneously breathing, ventilating well, oxygen  via simple oxygen  mask.  Post-op Assessment: Report given to PACU RN, vital signs reviewed and stable, moving all extremities.   Post vital signs: Reviewed and stable.  Complications: No apparent anesthesia complications  Last Vitals:  Vitals Value Taken Time  BP 113/52 10/10/24 12:20  Temp    Pulse 69 10/10/24 12:22  Resp 16 10/10/24 12:22  SpO2 97 % 10/10/24 12:22  Vitals shown include unfiled device data.  Last Pain:  Vitals:   10/10/24 1220  TempSrc:   PainSc: 0-No pain         Complications: No notable events documented.

## 2024-10-10 NOTE — Op Note (Signed)
 [facility] Patient Name: Arthur Church Procedure Date: 10/10/2024 MRN: [id number] Attending MD: Gustav ALONSO Mcgee , MD, [id number] Date of Birth: 10/10/1956 CSN: [id number] Age: 68 Admit Type: Ambulatory Procedure:                Colonoscopy Indications:              Surveillance: Piecemeal removal of large sessile                            adenoma last colonoscopy (< 3 yrs), High risk colon                            cancer surveillance: Personal history of adenoma                            (10 mm or greater in size), High risk colon cancer                            surveillance: Personal history of multiple (3 or                            more) adenomas Providers:                Gustav ALONSO Mcgee, MD, Ozell Pouch, [place], Technician Referring MD:              Medicines:                Monitored Anesthesia Care Complications:            No immediate complications. Estimated Blood Loss:     Estimated blood loss was minimal. Procedure:                Pre-Anesthesia Assessment:                           - Prior to the procedure, a History and Physical                            was performed, and patient medications and                            allergies were reviewed. The patient's tolerance of                            previous anesthesia was also reviewed. The risks                            and benefits of the procedure and the sedation                            options and risks were discussed with the patient.  All questions were answered, and informed consent                            was obtained. Prior Anticoagulants: The patient                            last took Eliquis  (apixaban ) 2 days prior to the                            procedure. ASA Grade Assessment: III - A patient                            with severe systemic disease. After reviewing the                             risks and benefits, the patient was deemed in                            satisfactory condition to undergo the procedure.                           After obtaining informed consent, the colonoscope                            was passed under direct vision. Throughout the                            procedure, the patient's blood pressure, pulse, and                            oxygen  saturations were monitored continuously. The                            [id number]-[id number] ([id number]) Olympus Colonoscope was                            introduced through the anus and advanced to the the                            cecum, identified by appendiceal orifice and                            ileocecal valve. The colonoscopy was performed                            without difficulty. The patient tolerated the                            procedure well. The quality of the bowel                            preparation was good. The ileocecal valve,  appendiceal orifice, and rectum were photographed. Scope In: 11:36:51 AM Scope Out: 12:10:55 PM Scope Withdrawal Time: 0 hours 31 minutes 38 seconds  Total Procedure Duration: 0 hours 34 minutes 4 seconds  Findings:      The perianal and digital rectal examinations were normal.      A 5 mm polyp was found in the ileocecal valve. The polyp was granular       lateral spreading. Polypectomy was attempted, initially using a cold       snare. Polyp resection was incomplete with this device. This       intervention then required a different device and polypectomy technique.       The polyp was removed with a piecemeal technique using a cold biopsy       forceps. Resection and retrieval were complete.      Three sessile polyps were found in the descending colon, transverse       colon and ascending colon. The polyps were 4 to 8 mm in size. These       polyps were removed with a cold snare. Resection and retrieval were       complete.       Scattered small-mouthed diverticula were found in the sigmoid colon,       descending colon and ascending colon.      Non-bleeding external and internal hemorrhoids were found during       retroflexion. The hemorrhoids were medium-sized. Impression:               - One 5 mm polyp at the ileocecal valve, removed                            piecemeal using a cold biopsy forceps. Resected and                            retrieved.                           - Three 4 to 8 mm polyps in the descending colon,                            in the transverse colon and in the ascending colon,                            removed with a cold snare. Resected and retrieved.                           - Moderate diverticulosis in the sigmoid colon, in                            the descending colon and in the ascending colon.                           - Non-bleeding external and internal hemorrhoids. Moderate Sedation:      N/A Recommendation:           - Patient has a contact number available for  emergencies. The signs and symptoms of potential                            delayed complications were discussed with the                            patient. Return to normal activities tomorrow.                            Written discharge instructions were provided to the                            patient.                           - Resume previous diet.                           - Continue present medications.                           - Await pathology results.                           - Repeat colonoscopy in 1 year for surveillance                            based on pathology results. Procedure Code(s):        --- Professional ---                           [phone], Colonoscopy, flexible; with removal of                            tumor(s), polyp(s), or other lesion(s) by snare                            technique                           [id number], 59, Colonoscopy, flexible; with  biopsy,                            single or multiple Diagnosis Code(s):        --- Professional ---                           D12.0, Benign neoplasm of cecum                           D12.4, Benign neoplasm of descending colon                           D12.3, Benign neoplasm of transverse colon (hepatic                            flexure or splenic flexure)  D12.2, Benign neoplasm of ascending colon                           K64.8, Other hemorrhoids                           Z86.010, Personal history of colonic polyps                           K57.30, Diverticulosis of large intestine without                            perforation or abscess without bleeding CPT copyright 2022 American Medical Association. All rights reserved. The codes documented in this report are preliminary and upon coder review may  be revised to meet current compliance requirements. Elvena Oyer V. Chaquetta Schlottman, MD 10/10/2024 12:31:09 PM This report has been signed electronically. Number of Addenda: 0

## 2024-10-10 NOTE — Anesthesia Preprocedure Evaluation (Addendum)
 Anesthesia Evaluation  Patient identified by MRN, date of birth, ID band Patient awake    Reviewed: Allergy & Precautions, H&P , NPO status , Patient's Chart, lab work & pertinent test results, reviewed documented beta blocker date and time , Unable to perform ROS - Chart review only  Airway Mallampati: II  TM Distance: >3 FB Neck ROM: Full    Dental  (+) Poor Dentition, Partial Upper, Missing, Partial Lower,    Pulmonary shortness of breath and with exertion, sleep apnea , pneumonia, COPD,  COPD inhaler, neg recent URI, former smoker    + decreased breath sounds      Cardiovascular hypertension, Pt. on medications and Pt. on home beta blockers + CAD, + Past MI, + CABG and +CHF  + dysrhythmias Atrial Fibrillation + Valvular Problems/Murmurs MR  Rhythm:Regular Rate:Normal  Echo 04/2024  1. Left ventricular ejection fraction, by estimation, is 45 to 50%. The left  ventricle has mildly decreased function. The left ventricle demonstrates  global hypokinesis. The left ventricular internal cavity size was  moderately dilated. Left ventricular diastolic parameters were normal.   2. Right ventricular systolic function is normal. The right ventricular  size is moderately enlarged.   3. Left atrial size was moderately dilated.   4. Right atrial size was moderately dilated.   5. The mitral valve is normal in structure. No evidence of mitral valve  regurgitation. No evidence of mitral stenosis.   6. The aortic valve is normal in structure. Aortic valve regurgitation is  not visualized. No aortic stenosis is present.   7. The inferior vena cava is normal in size with greater than 50%  respiratory variability, suggesting right atrial pressure of 3 mmHg.     '24 TTE - EF 50%. The left ventricle has mildly decreased function. Global hypokinesis. Grade II diastolic dysfunction (pseudonormalization). Right ventricular systolic function is mildly  reduced. Left atrial size was mild to moderately dilated. Mild mitral valve regurgitation (MR not fully visualized, could be worse). MR called moderate on echo in 2023  Cath 04/23:   Prox LAD to Mid LAD lesion is 85% stenosed.   Prox RCA lesion is 90% stenosed.   The left ventricular systolic function is normal.   LV end diastolic pressure is normal.    Neuro/Psych  PSYCHIATRIC DISORDERS Anxiety      Neuromuscular disease  negative psych ROS   GI/Hepatic ,GERD  Medicated and Controlled,,(+)     substance abuse  alcohol use  Endo/Other  diabetes, Type 2  Class 3 obesity  Renal/GU negative Renal ROS     Musculoskeletal negative musculoskeletal ROS (+)    Abdominal  (+) + obese  Peds negative pediatric ROS (+)  Hematology  On eliquis     Anesthesia Other Findings   Reproductive/Obstetrics                              Anesthesia Physical Anesthesia Plan  ASA: 4  Anesthesia Plan: MAC   Post-op Pain Management: Minimal or no pain anticipated   Induction: Intravenous  PONV Risk Score and Plan: 1 and Treatment may vary due to age or medical condition, Ondansetron , Dexamethasone  and Midazolam   Airway Management Planned: Natural Airway  Additional Equipment: None  Intra-op Plan:   Post-operative Plan: Extubation in OR  Informed Consent: I have reviewed the patients History and Physical, chart, labs and discussed the procedure including the risks, benefits and alternatives for the proposed anesthesia with the  patient or authorized representative who has indicated his/her understanding and acceptance.     Dental advisory given  Plan Discussed with: CRNA  Anesthesia Plan Comments: (PAT note by Lynwood Hope, PA-C: 68 year old male follows with cardiology for history of HTN, A-fib/flutter with RVR with associated systolic heart failure (EF initially 15 to 20%, subsequent improved, most recently 50% on echo 04/2023), CAD s/p NSTEMI and CABG  x 2 in 03/2022 (LIMA to LAD and SVG to RCA - grafts patent on CTA 01/27/2023), venous insufficiency. He has had multiple cardioversions as well as ablation in 01/2023, now on amiodarone  and Eliquis  (previously failed Tikosyn ).  Last seen by Dr. Mona on 02/18/2024, noted to be doing well from a cardiac standpoint, maintaining sinus rhythm on amiodarone .  Cleared by Dr. Mona for upcoming surgery in telephone encounter 03/15/2024, Ok to proceed with surgery from a cardiac standpoint - can hold Eliquis  for 3 days prior to procedure and restart when safe from a bleeding standpoint after.  Follows with pul[facility]ology at [facility] for history of former smoker with associated COPD with chronic bronchitis, OSA/restrictive lung disease (not on CPAP, uses 2-3 L supplemental O2 at night).  He is maintained on Trelegy Ellipta   Other pertinent history includes GERD on PPI, morbid obesity, EtOH abuse.  Patient reports last dose Eliquis  03/20/2024.  Preop labs reviewed, unremarkable.  EKG 02/18/2024: Sinus rhythm with 1st degree A-V block.  Rate 84. Nonspecific ST and T wave abnormality  TTE 04/19/2023: 1. Left ventricular ejection fraction, by estimation, is 50%. The left  ventricle has mildly decreased function. The left ventricle demonstrates  global hypokinesis. Left ventricular diastolic parameters are consistent  with Grade II diastolic dysfunction  (pseudonormalization).  2. Right ventricular systolic function is mildly reduced. The right  ventricular size is normal. Tricuspid regurgitation signal is inadequate  for assessing PA pressure.  3. Left atrial size was mild to moderately dilated.  4. The mitral valve is abnormal. Mild mitral valve regurgitation (MR not  fully visualized, could be worse). No evidence of mitral stenosis.  5. The aortic valve is tricuspid. Aortic valve regurgitation is not  visualized. No aortic stenosis is present.  6. The inferior vena cava is dilated in size with <50% respiratory   variability, suggesting right atrial pressure of 15 mmHg.  7. Technically difficult study with very poor images.   )         Anesthesia Quick Evaluation

## 2024-10-11 ENCOUNTER — Encounter (HOSPITAL_COMMUNITY): Payer: Self-pay | Admitting: Gastroenterology

## 2024-10-11 ENCOUNTER — Ambulatory Visit: Payer: Self-pay | Admitting: Gastroenterology

## 2024-10-11 LAB — SURGICAL PATHOLOGY

## 2024-10-11 NOTE — Anesthesia Postprocedure Evaluation (Signed)
 Anesthesia Post Note  Patient: Arthur Church  Procedure(s) Performed: COLONOSCOPY     Patient location during evaluation: PACU Anesthesia Type: MAC Level of consciousness: awake and alert Pain management: pain level controlled Vital Signs Assessment: post-procedure vital signs reviewed and stable Respiratory status: spontaneous breathing Cardiovascular status: stable Anesthetic complications: no   No notable events documented.  Last Vitals:  Vitals:   10/10/24 1245 10/10/24 1250  BP: 138/68 (!) 147/76  Pulse: 63 62  Resp: 11 11  Temp:    SpO2: 99% 98%    Last Pain:  Vitals:   10/10/24 1250  TempSrc:   PainSc: 0-No pain                 [name]

## 2024-11-20 NOTE — Progress Notes (Signed)
 Arthur Church is a 68 y.o.  with  reports that he quit smoking about 12 years ago. His smoking use included cigarettes. He has never been exposed to tobacco smoke. He has never used smokeless tobacco. with  Active Ambulatory Problems    Diagnosis Date Noted  . COPD with chronic bronchitis and emphysema    (CMD) 08/31/2019  . Essential hypertension 06/28/2015  . LPRD (laryngopharyngeal reflux disease) 02/03/2019  . Non-ischemic cardiomyopathy    (CMD) 05/03/2015  . PAF (paroxysmal atrial fibrillation) (CMD) 05/03/2015  . 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/2017  . Psoriasis 04/03/2015  . Radiculitis of left cervical region 04/03/2019  . Rosacea 04/03/2015  . Varicose veins 09/06/2015  . Mixed hyperlipidemia 09/19/2020  . Acute coronary syndrome    (CMD) 03/14/2022  . History of non-ST elevation myocardial infarction (NSTEMI) 04/09/2022  . 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  . Dermatochalasis of both upper eyelids 11/26/2022  . Chronic combined systolic and diastolic heart failure    (CMD) 05/15/2022  . History of adenomatous polyp of colon 03/15/2024  . 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 OSA (obstructive sleep apnea) 10/08/2020   Resolved Ambulatory Problems    Diagnosis Date Noted  . Diabetes mellitus type 2, diet-controlled (HCC) 04/03/2019  . Morbid obesity (HCC) 03/05/2015  . Right foot pain 05/17/2017  . Shortness of breath 07/17/2019  . Chronic heart failure with preserved ejection fraction (HFpEF) (HCC) 08/08/2020  . Morbid obesity with body mass index (BMI) of 40.0 to 44.9 in adult (CMD) 05/03/2023  . Morbid obesity with body mass index (BMI) of 45.0 to 49.9 in adult (CMS/HCC) 01/19/2024   Past Medical History:  Diagnosis Date  . Anxiety 3 years ago  . Cataract   . CHF (congestive heart failure)    (CMD) 6 years ago  . COPD (chronic obstructive pulmonary disease) (CMD)   . Diabetes mellitus (CMD)   . Eczema 10 years ago  . GERD (gastroesophageal reflux disease)  11/24/20  . Hypertension   . Interstitial lung disease    (CMD) 11/24/20  . Myocardial infarction    (CMD) 3 years ago  . Obesity Most of life  . Pulmonary arterial hypertension    (CMD) 11/24/20  . Pulmonary embolism (CMD) 11/24/20  . Varicella As a child  . Visual impairment Corrected with cadiract surgery   who presents today at Urgent Care for  Chief Complaint  Patient presents with  . Coughing Up Blood    Pt reports coughing up blood started 3 weeks ago. Pt states he went on vacation and that's when it states they had to give him oxygen  on the air plane when he was in high altitudes but felt fine by the time he landed. Pt states that he's unsure if the bleeding is from sinuses from the past but this time it was all blood/ big blood clots and very little blood yesterday and today it was more mucus and blood. Pt denies taking any medication for treatment of current condition.        History of Present Illness This is a 68 year old male with a history of pulmonary edema and open-heart surgery presenting with hemoptysis for the last 3 weeks.  The patient reports that his symptoms began during a vacation, where he required oxygen  therapy due to difficulty breathing at high altitudes. His condition improved upon landing. He is uncertain if the bleeding is from his sinuses, a condition he has experienced in the past. He has observed significant blood and  blood clots in his sputum, with minimal blood yesterday and an increase in mucus today. He believes his symptoms originated from his nasal passages, as he recalls coughing up solid blood clots 2 to 3 weeks ago. The severity of his symptoms has decreased over time, but they persist. He has not taken any medications for his current condition.   He has been prescribed a Z-Pak by his primary care physician in the past, but due to potential interactions with his other medications, he was given Augmentin  instead. This treatment resolved his symptoms  within a week. He has an appointment with his pulmonologist tomorrow. He has been on Lasix  25 mg every morning and takes more if needed. He has been increasing the dose as needed. He uses oxygen  at home while sleeping because his oxygen  levels drop too low during sleep. He has to slowly wean off the oxygen  in the morning, but after a couple of hours, he does not need it anymore. His normal SpO2 is 93.  He has a history of open-heart surgery and believes there may be an issue with the valve between his heart and lungs.  PAST SURGICAL HISTORY: Open-heart surgery      Patient has no co-morbidities  or medications that might increase the risk for developing a severe infection     reports that he quit smoking about 12 years ago. His smoking use included cigarettes. He has never been exposed to tobacco smoke. He has never used smokeless tobacco.  Review of Systems  Review of systems is otherwise negative except as noted in the HPI and Assessment/MDM   Physical Exam  BP 130/74   Pulse 63   Temp 97.7 F (36.5 C) (Oral)   Resp 20   Ht 1.829 m (6')   Wt (!) 156 kg (345 lb)   SpO2 95%   BMI 46.79 kg/m   Constitutional:      General: Patient is not in acute distress.    Appearance: Normal appearance.  Neurological:     General: No focal deficit present.     Mental Status: alert and oriented to person, place, and time.  HENT:     Eyes:     Conjunctiva/sclera: Conjunctivae normal. Pharynx normal    There is no associated anterior cervical lymphadenopathy    Pupils: Pupils are equal, round, and reactive to light.     TM's normal with no visible effusion  Cardiovascular:     Heart sounds: Normal heart sounds. No murmur heard.    No Lower extremity edema noted Pulmonary:     Effort: Pulmonary effort is normal. No respiratory distress.     Breath sounds: No wheezing, rhonchi or rales.  Abdominal:     General: Bowel sounds are normal. There is no distension.     Tenderness: There  is no abdominal tenderness.  Musculoskeletal:        General: Normal range of motion.     Neck:  No rigidity.  Skin:    General: Skin is warm and dry.  Psychiatric:        Mood and Affect: Mood normal.   XR Chest 2 Views  Final Result by Deward Franky Conn, MD (12/08 1637)  XR CHEST 2 VIEWS, 11/20/2024 4:29 PM    INDICATION: Hemoptysis \ R04.2 Hemoptysis     COMPARISON: 08/21/2020    FINDINGS:     Cardiovascular: Chronic cardiomegaly. Status post CABG.    Lungs/pleura: Generalized interstitial and pulmonary vascular prominence.   Trace left pleural  effusion.     Upper abdomen: No acute abnormality.     Osseous structures: No acute abnormality.      IMPRESSION:  Cardiomegaly, pulmonary edema, and trace left pleural effusion.              DIAGNOSIS/PLAN     1. COPD with chronic bronchitis and emphysema    (CMD)  amoxicillin -pot clavulanate (AUGMENTIN ) 875-125 mg per tablet   predniSONE  (DELTASONE ) 20 mg tablet    2. Hemoptysis  XR Chest 2 Views      Assessment & Plan Initial Assessment: 68 year old male with 3-week history of coughing up blood, recent vacation requiring oxygen  at high altitudes, and yellow sputum.  Differential Diagnosis: - Pulmonary edema   - Fluid in alveoli causing difficulty breathing and blood-tinged sputum   - Plan: Augmentin  875 mg twice daily for 10 days, prednisone  40 mg daily for 5 days, continue furosemide , follow up with pulmonologist - Pneumonia   - Lung infiltrates suggest possible infection   - Plan: Augmentin , prednisone , follow up with pulmonologist - Aspiration   - Lung infiltrates could indicate aspiration   - Plan: Augmentin , prednisone , follow up with pulmonologist - Cardiac-related issues   - Symptoms may be associated with heart condition   - Plan: Follow up with cardiologist if pulmonologist deems non-lung related  ED Course: - Prescription for Augmentin  875 mg twice daily for 10 days - Prescription for  prednisone  40 mg daily for 5 days  Final Assessment: Pulmonary edema with possible pneumonia or aspiration. Treatment initiated with antibiotics and steroids. Follow-up with pulmonologist and cardiologist recommended.  Clinical Impression: - Pulmonary edema - Pneumonia - Aspiration  Follow-Up: - Pulmonologist appointment tomorrow - Cardiologist consultation if symptoms persist  Patient Education: Medication instructions, importance of follow-up with pulmonologist and cardiologist, signs to seek immediate medical attention  Portions of this note were created using the aid of voice recognition Dragon/DAX dictation software.   We discussed risks and side effects of medications, and also discussed red flags which would warrant immediate follow-up.   Urgent Care Disposition:  Home Care

## 2024-11-21 ENCOUNTER — Ambulatory Visit

## 2024-11-21 VITALS — BP 124/62 | HR 74 | Temp 98.1°F | Ht 73.0 in | Wt 350.0 lb

## 2024-11-21 DIAGNOSIS — R042 Hemoptysis: Secondary | ICD-10-CM

## 2024-11-21 DIAGNOSIS — J439 Emphysema, unspecified: Secondary | ICD-10-CM

## 2024-11-21 DIAGNOSIS — J810 Acute pulmonary edema: Secondary | ICD-10-CM

## 2024-11-21 NOTE — Progress Notes (Signed)
 Subjective:   PATIENT ID: Arthur Church GENDER: male DOB: Aug 29, 1956, MRN: [id number]   HPI Discussed the use of AI scribe software for clinical note transcription with the patient, who gave verbal consent to proceed.  History of Present Illness Arthur Church is a 68 year old male with COPD who presents with hemoptysis after recent air travel.  He experienced hemoptysis following a recent flight, during which he required supplemental oxygen . Upon returning home, he began coughing up blood, initially dark but becoming brighter. He attributes the bleeding to a possible sinus infection, noting the use of a nasal inhaler before the flight. No epistaxis, but he felt something different in his nasal passages. He has been coughing up both blood and phlegm, with the blood being 'pure blood' for a while.  He visited urgent care where he received an x-ray and was told the results included pulmonary edema, left-sided pleural effusion, and cardiomegaly. He was treated with steroids, Lasix , and an antibiotic. He is currently on Lasix  40 mg in the morning and more as needed, having taken 60 mg today and planning to take 40 mg more tonight. His leg swelling fluctuates, worsening with dietary indiscretions, but is currently improving.  He has a history of COPD and uses Trelegy daily, ensuring to rinse his mouth after use. He also has a rescue albuterol  inhaler, which he seldom uses. He quit smoking in 2014. He uses oxygen  at night at 3 liters and occasionally requires it for exertion in certain situations.  He is on Eliquis , initially prescribed for atrial fibrillation, which he no longer experiences following an ablation in 2024. He has not had any clots in his legs or lungs. He reports calf cramps, likely due to Lasix  use, and has potassium pills available to manage this.  He traveled to [place]  for Thanksgiving, which coincided with the onset of his current symptoms. He has a history of sinus  issues, with his left nostril frequently becoming blocked. He uses over-the-counter Flonase and has previously used Afrin, which he no longer uses due to its addictive nature.     Past Medical History:  Diagnosis Date   Anxiety    Chronic systolic CHF (congestive heart failure) (HCC)    a. 2D ECHO (03/06/15) EF 20-25%, diffuse HK. Asc aortic diameter: 40mm. Mild LA dilation, mild RV dilation. Mild RV systolic dysfunction   b. LifeVest placed on 02/2015 admissoin    Coronary artery disease    a. 70% LAD lesion   Dysrhythmia    Elevated TSH    ETOH abuse    Hypertension    Morbid obesity (HCC)    a. BMI ~46   Persistent atrial fibrillation (HCC)    a. newly dx 02/2015 admission. s/p successful TEE/DCCV  b. on Eliquis    Pre-diabetes    a. HgA1c 6.1     Family History  Problem Relation Age of Onset   Heart attack Mother    Breast cancer Mother    Uterine cancer Sister    Sleep apnea Brother    Sleep apnea Brother    Heart attack Maternal Grandfather    Colon cancer Neg Hx    Liver cancer Neg Hx    Stomach cancer Neg Hx    Esophageal cancer Neg Hx      Social History   Socioeconomic History   Marital status: Widowed    Spouse name: Not on file   Number of children: 1   Years of education: Not on  file   Highest education level: Not on file  Occupational History   Occupation: security guard  Tobacco Use   Smoking status: Former    Current packs/day: 0.00    Types: Cigarettes    Quit date: 01/02/2013    Years since quitting: 11.8   Smokeless tobacco: Never   Tobacco comments:    smokes about a week  Vaping Use   Vaping status: Never Used  Substance and Sexual Activity   Alcohol use: Yes    Comment: Occassional   Drug use: No   Sexual activity: Not Currently  Other Topics Concern   Not on file  Social History Narrative   Arthur Church is a 68 year old male 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 He reports he is independent with all his care needs and  transportation to medical appointments   He is stating he is to either retire or go out on disability soon.    His daughter, Arthur Church is a SW per pt   Social Drivers of Corporate Investment Banker Strain: Low Risk  (04/20/2023)   Overall Financial Resource Strain (CARDIA)    Difficulty of Paying Living Expenses: Not hard at all  Food Insecurity: Low Risk (08/02/2024)   Received from [facility]   Hunger Vital Sign    Within the past 12 months, you worried that your food would run out before you got money to buy more: Never true    Within the past 12 months, the food you bought just didn't last and you didn't have money to get more. : Never true  Transportation Needs: No Transportation Needs (08/02/2024)   Received from [facility]    In the past 12 months, has lack of reliable transportation kept you from medical appointments, meetings, work or from getting things needed for daily living? : No  Physical Activity: Not on file  Stress: Not on file  Social Connections: Moderately Integrated (03/24/2024)   Social Connection and Isolation Panel    Frequency of Communication with Friends and Family: More than three times a week    Frequency of Social Gatherings with Friends and Family: More than three times a week    Attends Religious Services: More than 4 times per year    Active Member of [employer] or Organizations: Yes    Attends Banker Meetings: Never    Marital Status: Widowed  Intimate Partner Violence: Not At Risk (03/24/2024)   Humiliation, Afraid, Rape, and Kick questionnaire    Fear of Current or Ex-Partner: No    Emotionally Abused: No    Physically Abused: No    Sexually Abused: No     No Known Allergies   Outpatient Medications Prior to Visit  Medication Sig Dispense Refill   acetaminophen  (TYLENOL ) 500 MG tablet Take 500 mg by mouth every 6 (six) hours as needed for mild pain, moderate pain or headache.     albuterol  (VENTOLIN  HFA) 108 (90  Base) MCG/ACT inhaler Inhale 2 puffs into the lungs every 6 (six) hours as needed for wheezing or shortness of breath. 1 each 5   ALPRAZolam  (XANAX ) 0.5 MG tablet TAKE 1 TABLET BY MOUTH THREE TIMES DAILYAS NEEDED FOR ANXIETY 20 tablet 0   apixaban  (ELIQUIS ) 5 MG TABS tablet Take 1 tablet (5 mg total) by mouth 2 (two) times daily. 180 tablet 0   atorvastatin  (LIPITOR ) 80 MG tablet Take 1 tablet (80 mg total) by mouth daily.  90 tablet 3   carvedilol  (COREG ) 6.25 MG tablet TAKE 1 TABLET(6.25 MG) BY MOUTH TWICE DAILY WITH A MEAL 180 tablet 3   furosemide  (LASIX ) 40 MG tablet TAKE 1 TABLET(40 MG) BY MOUTH TWICE DAILY 180 tablet 1   hydrocortisone 2.5 % cream Apply 1 Application topically daily as needed (rash, itching, irritation).     loratadine  (CLARITIN ) 10 MG tablet Take 10 mg by mouth daily.     Magnesium  400 MG TABS Take 400 mg by mouth 2 (two) times a day. 60 tablet 11   montelukast  (SINGULAIR ) 10 MG tablet Take 10 mg by mouth at bedtime.     OXYGEN  Inhale 2-3 L into the lungs at bedtime.     pantoprazole  (PROTONIX ) 40 MG tablet Take 1 tablet (40 mg total) by mouth daily. 90 tablet 1   sacubitril -valsartan  (ENTRESTO ) 24-26 MG Take 1 tablet by mouth 2 (two) times daily. 180 tablet 3   Semaglutide,0.25 or 0.5MG /DOS, (OZEMPIC, 0.25 OR 0.5 MG/DOSE,) 2 MG/1.5ML SOPN Inject 0.25 mg into the skin once a week.     spironolactone  (ALDACTONE ) 25 MG tablet TAKE 1/2 TABLET(12.5 MG) BY MOUTH DAILY 45 tablet 3   TRELEGY ELLIPTA  100-62.5-25 MCG/ACT AEPB Inhale 1 puff into the lungs daily. 1 each 11   oxyCODONE -acetaminophen  (PERCOCET) 10-325 MG tablet Take 1 tablet by mouth every 6 (six) hours as needed for pain. (Patient not taking: Reported on 11/21/2024) 30 tablet 0   No facility-administered medications prior to visit.    ROS Reviewed all systems and reported negative except as above     Objective:   Vitals:   11/21/24 1558  BP: 124/62  Pulse: 74  Temp: 98.1 F (36.7 C)  SpO2: 94%  Weight:  (!) 350 lb (158.8 kg)  Height: 6' 1 (1.854 m)    Physical Exam Physical Exam GENERAL: Appropriate to age, no acute distress. HEAD EYES EARS NOSE THROAT: Moist mucous membranes, atraumatic, normocephalic. CHEST: Clear to auscultation bilaterally, no wheezing, no crackles, no rales. CARDIAC: Regular rate and rhythm, normal S1, normal S2, no murmurs, no rubs, no gallops. ABDOMEN: Soft, nontender. NEUROLOGICAL: Motor and sensation grossly intact, alert and oriented times X 3. EXTREMITIES: Warm, well perfused, no edema.     CBC    Component Value Date/Time   WBC 12.7 (H) 03/26/2024 1156   RBC 5.01 03/26/2024 1156   HGB 14.2 03/26/2024 1156   HGB 13.8 01/25/2023 1229   HCT 45.7 03/26/2024 1156   HCT 44.7 01/25/2023 1229   PLT 273 03/26/2024 1156   PLT 342 01/25/2023 1229   MCV 91.2 03/26/2024 1156   MCV 86 01/25/2023 1229   MCH 28.3 03/26/2024 1156   MCHC 31.1 03/26/2024 1156   RDW 14.6 03/26/2024 1156   RDW 13.0 01/25/2023 1229   LYMPHSABS 2.3 04/18/2023 1938   MONOABS 0.9 04/18/2023 1938   EOSABS 0.2 04/18/2023 1938   BASOSABS 0.1 04/18/2023 1938    PFT:    Latest Ref Rng & Units 03/19/2022   11:17 AM 08/31/2019   10:46 AM  PFT Results  FVC-Pre L 3.95  2.84   FVC-Predicted Pre % 79  56   FVC-Post L  3.07   FVC-Predicted Post %  60   Pre FEV1/FVC % % 61  66   Post FEV1/FCV % %  67   FEV1-Pre L 2.41  1.86   FEV1-Predicted Pre % 64  49   FEV1-Post L  2.06   DLCO uncorrected ml/min/mmHg  21.31   DLCO  [facility]% %  73   DLVA Predicted %  100   TLC L  6.04   TLC % Predicted %  81   RV % Predicted %  133          Assessment & Plan:   Assessment and Plan Assessment & Plan Acute pulmonary edema with hemoptysis Acute pulmonary edema likely exacerbated by travel and sinusitis. Hemoptysis likely due to sinusitis and possible epistaxis. Eliquis  held to prevent further bleeding. - Hold Eliquis  for 3 days. - Increase Lasix  to 80 mg in the morning and 40 mg at 2 PM for 3  days. - Finish Augmentin  and prednisone . - Monitor symptoms; consider ER if hemoptysis persists or worsens. - Consider chest CT if no improvement.  Chronic obstructive pulmonary disease COPD managed with Trelegy inhaler. No issues with inhaler use. - Continue Trelegy inhaler daily.  Nocturnal hypoxemia Managed with nighttime oxygen  therapy at 3 liters. No issues reported. - Continue nighttime oxygen  therapy at 3 liters.  Acute sinusitis Suspected due to nasal congestion and epistaxis, contributing to hemoptysis. Nasal inhaler may have exacerbated symptoms. - Finish Augmentin  for 10 days. - Avoid Afrin. - Use saline nasal spray or humidifier.        Zola Herter, MD [facility] Office: [phone]

## 2024-11-21 NOTE — Patient Instructions (Signed)
  VISIT SUMMARY: Today, you were seen for coughing up blood after a recent flight. You have a history of COPD and use oxygen  at night. You were treated at urgent care with medications and had an x-ray showing fluid in your lungs and an enlarged heart. We discussed your symptoms and adjusted your medications to help manage your condition.  YOUR PLAN: -ACUTE PULMONARY EDEMA WITH HEMOPTYSIS: Acute pulmonary edema is a condition where fluid builds up in the lungs, which can be worsened by travel and sinus issues. Hemoptysis means coughing up blood. You should stop taking Eliquis  for 3 days to prevent further bleeding, increase your Lasix  dose to 80 mg in the morning and 40 mg at 2 PM for 3 days, and finish your course of Augmentin  and prednisone . Monitor your symptoms and go to the ER if the bleeding continues or gets worse. We may consider a chest CT if there is no improvement.  -CHRONIC OBSTRUCTIVE PULMONARY DISEASE (COPD): COPD is a chronic lung disease that makes it hard to breathe. Continue using your Trelegy inhaler daily as prescribed.  -NOCTURNAL HYPOXEMIA: Nocturnal hypoxemia means low oxygen  levels at night. Continue using your nighttime oxygen  therapy at 3 liters as you have been.  -ACUTE SINUSITIS: Acute sinusitis is an infection or inflammation of the sinuses. Finish your Augmentin  for 10 days, avoid using Afrin, and use a saline nasal spray or humidifier to help with your symptoms.  INSTRUCTIONS: Hold Eliquis  for 3 days. Increase Lasix  to 80 mg in the morning and 40 mg at 2 PM for 3 days. Finish Augmentin  and prednisone . Monitor symptoms and go to the ER if hemoptysis persists or worsens. Consider chest CT if no improvement.          Contains text generated by [employer].

## 2024-11-24 ENCOUNTER — Other Ambulatory Visit (HOSPITAL_BASED_OUTPATIENT_CLINIC_OR_DEPARTMENT_OTHER): Payer: Self-pay

## 2024-11-24 ENCOUNTER — Telehealth: Payer: Self-pay | Admitting: Internal Medicine

## 2024-11-24 NOTE — Telephone Encounter (Signed)
 Pt c/o medication issue:  1. Name of Medication:    sacubitril -valsartan  (ENTRESTO ) 24-26 MG    2. How are you currently taking this medication (dosage and times per day)? As written   3. Are you having a reaction (difficulty breathing--STAT)? No   4. What is your medication issue? Pt called in stating he received notice that his insurance will no longer cover this med starting the beginning of the year. Please advise what is he supposed to do.

## 2024-11-24 NOTE — Telephone Encounter (Signed)
 Spoke with pt regarding his Entresto  and concerns that this medication will not the covered after the beginning of the year. Explained that entresto  recently transitioned to a generic medication. Pt received this information via email and he does not have this email handy. Pt will look back over email and check with his pharmacy. Pt will let us  know if we need to sent in generic of Entresto  sent in or if prior authorization is needed. Pt states he has enough of his medication to get him into the beginning of the year. No further questions at this time.

## 2024-12-08 ENCOUNTER — Other Ambulatory Visit: Payer: Self-pay | Admitting: Internal Medicine

## 2025-01-12 ENCOUNTER — Encounter (HOSPITAL_COMMUNITY): Payer: Self-pay

## 2025-01-12 ENCOUNTER — Emergency Department (HOSPITAL_COMMUNITY)

## 2025-01-12 ENCOUNTER — Other Ambulatory Visit: Payer: Self-pay

## 2025-01-12 ENCOUNTER — Ambulatory Visit: Attending: Nurse Practitioner | Admitting: Nurse Practitioner

## 2025-01-12 ENCOUNTER — Inpatient Hospital Stay (HOSPITAL_COMMUNITY)
Admission: EM | Admit: 2025-01-12 | Discharge: 2025-01-15 | DRG: 193 | Disposition: A | Attending: Internal Medicine | Admitting: Internal Medicine

## 2025-01-12 VITALS — BP 128/70 | HR 74 | Ht 72.0 in | Wt 352.0 lb

## 2025-01-12 DIAGNOSIS — Z951 Presence of aortocoronary bypass graft: Secondary | ICD-10-CM

## 2025-01-12 DIAGNOSIS — I2510 Atherosclerotic heart disease of native coronary artery without angina pectoris: Secondary | ICD-10-CM

## 2025-01-12 DIAGNOSIS — E785 Hyperlipidemia, unspecified: Secondary | ICD-10-CM | POA: Diagnosis not present

## 2025-01-12 DIAGNOSIS — F419 Anxiety disorder, unspecified: Secondary | ICD-10-CM | POA: Diagnosis present

## 2025-01-12 DIAGNOSIS — Z79899 Other long term (current) drug therapy: Secondary | ICD-10-CM

## 2025-01-12 DIAGNOSIS — Z87891 Personal history of nicotine dependence: Secondary | ICD-10-CM

## 2025-01-12 DIAGNOSIS — E66813 Obesity, class 3: Secondary | ICD-10-CM | POA: Diagnosis present

## 2025-01-12 DIAGNOSIS — I4819 Other persistent atrial fibrillation: Secondary | ICD-10-CM | POA: Diagnosis not present

## 2025-01-12 DIAGNOSIS — I110 Hypertensive heart disease with heart failure: Secondary | ICD-10-CM | POA: Diagnosis present

## 2025-01-12 DIAGNOSIS — I5020 Unspecified systolic (congestive) heart failure: Secondary | ICD-10-CM | POA: Diagnosis not present

## 2025-01-12 DIAGNOSIS — I4892 Unspecified atrial flutter: Secondary | ICD-10-CM | POA: Diagnosis present

## 2025-01-12 DIAGNOSIS — Z9842 Cataract extraction status, left eye: Secondary | ICD-10-CM

## 2025-01-12 DIAGNOSIS — Z9841 Cataract extraction status, right eye: Secondary | ICD-10-CM

## 2025-01-12 DIAGNOSIS — J449 Chronic obstructive pulmonary disease, unspecified: Secondary | ICD-10-CM | POA: Diagnosis not present

## 2025-01-12 DIAGNOSIS — I255 Ischemic cardiomyopathy: Secondary | ICD-10-CM

## 2025-01-12 DIAGNOSIS — J189 Pneumonia, unspecified organism: Principal | ICD-10-CM | POA: Diagnosis present

## 2025-01-12 DIAGNOSIS — Z7985 Long-term (current) use of injectable non-insulin antidiabetic drugs: Secondary | ICD-10-CM

## 2025-01-12 DIAGNOSIS — Z7901 Long term (current) use of anticoagulants: Secondary | ICD-10-CM

## 2025-01-12 DIAGNOSIS — Z8249 Family history of ischemic heart disease and other diseases of the circulatory system: Secondary | ICD-10-CM

## 2025-01-12 DIAGNOSIS — Z8719 Personal history of other diseases of the digestive system: Secondary | ICD-10-CM

## 2025-01-12 DIAGNOSIS — J9621 Acute and chronic respiratory failure with hypoxia: Secondary | ICD-10-CM | POA: Diagnosis present

## 2025-01-12 DIAGNOSIS — I10 Essential (primary) hypertension: Secondary | ICD-10-CM | POA: Diagnosis not present

## 2025-01-12 DIAGNOSIS — J440 Chronic obstructive pulmonary disease with acute lower respiratory infection: Secondary | ICD-10-CM | POA: Diagnosis present

## 2025-01-12 DIAGNOSIS — I5042 Chronic combined systolic (congestive) and diastolic (congestive) heart failure: Secondary | ICD-10-CM | POA: Diagnosis present

## 2025-01-12 DIAGNOSIS — J439 Emphysema, unspecified: Secondary | ICD-10-CM | POA: Diagnosis present

## 2025-01-12 DIAGNOSIS — R0602 Shortness of breath: Secondary | ICD-10-CM

## 2025-01-12 DIAGNOSIS — Z6841 Body Mass Index (BMI) 40.0 and over, adult: Secondary | ICD-10-CM

## 2025-01-12 DIAGNOSIS — K219 Gastro-esophageal reflux disease without esophagitis: Secondary | ICD-10-CM | POA: Diagnosis present

## 2025-01-12 LAB — BASIC METABOLIC PANEL WITH GFR
Anion gap: 8 (ref 5–15)
BUN: 22 mg/dL (ref 8–23)
CO2: 30 mmol/L (ref 22–32)
Calcium: 9.9 mg/dL (ref 8.9–10.3)
Chloride: 100 mmol/L (ref 98–111)
Creatinine, Ser: 0.7 mg/dL (ref 0.61–1.24)
GFR, Estimated: >60 mL/min
Glucose, Bld: 130 mg/dL — ABNORMAL HIGH (ref 70–99)
Potassium: 4.3 mmol/L (ref 3.5–5.1)
Sodium: 138 mmol/L (ref 135–145)

## 2025-01-12 LAB — CBC
HCT: 46.8 % (ref 39.0–52.0)
Hemoglobin: 14.5 g/dL (ref 13.0–17.0)
MCH: 27.3 pg (ref 26.0–34.0)
MCHC: 31 g/dL (ref 30.0–36.0)
MCV: 88.1 fL (ref 80.0–100.0)
Platelets: 309 10*3/uL (ref 150–400)
RBC: 5.31 MIL/uL (ref 4.22–5.81)
RDW: 15.2 % (ref 11.5–15.5)
WBC: 12.7 10*3/uL — ABNORMAL HIGH (ref 4.0–10.5)
nRBC: 0 % (ref 0.0–0.2)

## 2025-01-12 LAB — TROPONIN T, HIGH SENSITIVITY: Troponin T High Sensitivity: 18 ng/L (ref 0–19)

## 2025-01-12 NOTE — ED Triage Notes (Signed)
 Pt with a pocket knife given to security for pt to pick up upon discharge.

## 2025-01-12 NOTE — ED Triage Notes (Signed)
 Pt presents with sudden onset of fatigue, chest pain, and ShOB that started an hour ago. He reports his home pulse ox was reading HRs going down to the 40s. The chest pain is described as a jolt followed by a dull pain. Denies nausea or diaphoresis.

## 2025-01-13 ENCOUNTER — Encounter (HOSPITAL_COMMUNITY): Payer: Self-pay

## 2025-01-13 ENCOUNTER — Emergency Department (HOSPITAL_COMMUNITY)

## 2025-01-13 ENCOUNTER — Encounter: Payer: Self-pay | Admitting: Nurse Practitioner

## 2025-01-13 DIAGNOSIS — I4819 Other persistent atrial fibrillation: Secondary | ICD-10-CM | POA: Diagnosis present

## 2025-01-13 DIAGNOSIS — E66813 Obesity, class 3: Secondary | ICD-10-CM | POA: Diagnosis present

## 2025-01-13 DIAGNOSIS — J440 Chronic obstructive pulmonary disease with acute lower respiratory infection: Secondary | ICD-10-CM | POA: Diagnosis present

## 2025-01-13 DIAGNOSIS — J439 Emphysema, unspecified: Secondary | ICD-10-CM | POA: Diagnosis present

## 2025-01-13 DIAGNOSIS — I5042 Chronic combined systolic (congestive) and diastolic (congestive) heart failure: Secondary | ICD-10-CM | POA: Diagnosis present

## 2025-01-13 DIAGNOSIS — I2510 Atherosclerotic heart disease of native coronary artery without angina pectoris: Secondary | ICD-10-CM | POA: Diagnosis present

## 2025-01-13 DIAGNOSIS — K219 Gastro-esophageal reflux disease without esophagitis: Secondary | ICD-10-CM | POA: Diagnosis present

## 2025-01-13 DIAGNOSIS — F419 Anxiety disorder, unspecified: Secondary | ICD-10-CM | POA: Diagnosis present

## 2025-01-13 DIAGNOSIS — Z7901 Long term (current) use of anticoagulants: Secondary | ICD-10-CM | POA: Diagnosis not present

## 2025-01-13 DIAGNOSIS — Z9841 Cataract extraction status, right eye: Secondary | ICD-10-CM | POA: Diagnosis not present

## 2025-01-13 DIAGNOSIS — Z951 Presence of aortocoronary bypass graft: Secondary | ICD-10-CM | POA: Diagnosis not present

## 2025-01-13 DIAGNOSIS — Z8249 Family history of ischemic heart disease and other diseases of the circulatory system: Secondary | ICD-10-CM | POA: Diagnosis not present

## 2025-01-13 DIAGNOSIS — Z8719 Personal history of other diseases of the digestive system: Secondary | ICD-10-CM | POA: Diagnosis not present

## 2025-01-13 DIAGNOSIS — E785 Hyperlipidemia, unspecified: Secondary | ICD-10-CM | POA: Diagnosis present

## 2025-01-13 DIAGNOSIS — J9621 Acute and chronic respiratory failure with hypoxia: Secondary | ICD-10-CM | POA: Diagnosis present

## 2025-01-13 DIAGNOSIS — J189 Pneumonia, unspecified organism: Secondary | ICD-10-CM

## 2025-01-13 DIAGNOSIS — R0609 Other forms of dyspnea: Secondary | ICD-10-CM | POA: Diagnosis not present

## 2025-01-13 DIAGNOSIS — Z87891 Personal history of nicotine dependence: Secondary | ICD-10-CM | POA: Diagnosis not present

## 2025-01-13 DIAGNOSIS — I4892 Unspecified atrial flutter: Secondary | ICD-10-CM | POA: Diagnosis present

## 2025-01-13 DIAGNOSIS — Z6841 Body Mass Index (BMI) 40.0 and over, adult: Secondary | ICD-10-CM | POA: Diagnosis not present

## 2025-01-13 DIAGNOSIS — Z9842 Cataract extraction status, left eye: Secondary | ICD-10-CM | POA: Diagnosis not present

## 2025-01-13 DIAGNOSIS — Z7985 Long-term (current) use of injectable non-insulin antidiabetic drugs: Secondary | ICD-10-CM | POA: Diagnosis not present

## 2025-01-13 DIAGNOSIS — I110 Hypertensive heart disease with heart failure: Secondary | ICD-10-CM | POA: Diagnosis present

## 2025-01-13 DIAGNOSIS — Z79899 Other long term (current) drug therapy: Secondary | ICD-10-CM | POA: Diagnosis not present

## 2025-01-13 LAB — LACTIC ACID, PLASMA: Lactic Acid, Venous: 0.9 mmol/L (ref 0.5–1.9)

## 2025-01-13 LAB — TROPONIN T, HIGH SENSITIVITY: Troponin T High Sensitivity: 20 ng/L — ABNORMAL HIGH (ref 0–19)

## 2025-01-13 MED ORDER — CARVEDILOL 6.25 MG PO TABS
6.2500 mg | ORAL_TABLET | Freq: Two times a day (BID) | ORAL | Status: DC
  Administered 2025-01-13 – 2025-01-15 (×5): 6.25 mg via ORAL
  Filled 2025-01-13: qty 1
  Filled 2025-01-13: qty 2
  Filled 2025-01-13 (×3): qty 1

## 2025-01-13 MED ORDER — SODIUM CHLORIDE 0.9 % IV SOLN
1.0000 g | Freq: Once | INTRAVENOUS | Status: AC
  Administered 2025-01-13: 1 g via INTRAVENOUS
  Filled 2025-01-13: qty 10

## 2025-01-13 MED ORDER — SPIRONOLACTONE 12.5 MG HALF TABLET: 12.5000 mg | ORAL_TABLET | Freq: Every day | ORAL | Status: DC

## 2025-01-13 MED ORDER — ATORVASTATIN CALCIUM 40 MG PO TABS
80.0000 mg | ORAL_TABLET | Freq: Every day | ORAL | Status: DC
  Administered 2025-01-13 – 2025-01-15 (×3): 80 mg via ORAL
  Filled 2025-01-13 (×3): qty 2

## 2025-01-13 MED ORDER — SPIRONOLACTONE 12.5 MG HALF TABLET
12.5000 mg | ORAL_TABLET | Freq: Every day | ORAL | Status: DC
  Administered 2025-01-14 – 2025-01-15 (×2): 12.5 mg via ORAL
  Filled 2025-01-13 (×2): qty 1

## 2025-01-13 MED ORDER — MAGNESIUM OXIDE -MG SUPPLEMENT 400 (240 MG) MG PO TABS
400.0000 mg | ORAL_TABLET | Freq: Two times a day (BID) | ORAL | Status: DC
  Administered 2025-01-13 – 2025-01-15 (×5): 400 mg via ORAL
  Filled 2025-01-13 (×5): qty 1

## 2025-01-13 MED ORDER — TRAZODONE HCL 50 MG PO TABS
25.0000 mg | ORAL_TABLET | Freq: Every evening | ORAL | Status: DC | PRN
  Administered 2025-01-13 – 2025-01-14 (×2): 25 mg via ORAL
  Filled 2025-01-13 (×2): qty 1

## 2025-01-13 MED ORDER — BUDESON-GLYCOPYRROL-FORMOTEROL 160-9-4.8 MCG/ACT IN AERO
2.0000 | INHALATION_SPRAY | Freq: Two times a day (BID) | RESPIRATORY_TRACT | Status: DC
  Administered 2025-01-13 – 2025-01-15 (×4): 2 via RESPIRATORY_TRACT
  Filled 2025-01-13: qty 5.9

## 2025-01-13 MED ORDER — ALPRAZOLAM 0.5 MG PO TABS
0.5000 mg | ORAL_TABLET | Freq: Three times a day (TID) | ORAL | Status: DC | PRN
  Administered 2025-01-13 – 2025-01-14 (×2): 0.5 mg via ORAL
  Filled 2025-01-13 (×2): qty 1

## 2025-01-13 MED ORDER — ONDANSETRON HCL 4 MG/2ML IJ SOLN: 4.0000 mg | Freq: Four times a day (QID) | INTRAMUSCULAR | Status: DC | PRN

## 2025-01-13 MED ORDER — APIXABAN 5 MG PO TABS
5.0000 mg | ORAL_TABLET | Freq: Two times a day (BID) | ORAL | Status: DC
  Administered 2025-01-13 – 2025-01-15 (×5): 5 mg via ORAL
  Filled 2025-01-13 (×5): qty 1

## 2025-01-13 MED ORDER — PANTOPRAZOLE SODIUM 40 MG PO TBEC
40.0000 mg | DELAYED_RELEASE_TABLET | Freq: Every day | ORAL | Status: DC
  Administered 2025-01-13 – 2025-01-15 (×3): 40 mg via ORAL
  Filled 2025-01-13 (×3): qty 1

## 2025-01-13 MED ORDER — ONDANSETRON HCL 4 MG PO TABS: 4.0000 mg | ORAL_TABLET | Freq: Four times a day (QID) | ORAL | Status: DC | PRN

## 2025-01-13 MED ORDER — IPRATROPIUM-ALBUTEROL 0.5-2.5 (3) MG/3ML IN SOLN
3.0000 mL | Freq: Once | RESPIRATORY_TRACT | Status: AC
  Administered 2025-01-13: 3 mL via RESPIRATORY_TRACT
  Filled 2025-01-13: qty 3

## 2025-01-13 MED ORDER — ACETAMINOPHEN 650 MG RE SUPP: 650.0000 mg | Freq: Four times a day (QID) | RECTAL | Status: DC | PRN

## 2025-01-13 MED ORDER — OXYCODONE HCL 5 MG PO TABS: 5.0000 mg | ORAL_TABLET | ORAL | Status: DC | PRN

## 2025-01-13 MED ORDER — SODIUM CHLORIDE 0.9 % IV SOLN
2.0000 g | INTRAVENOUS | Status: DC
  Administered 2025-01-13 – 2025-01-15 (×3): 2 g via INTRAVENOUS
  Filled 2025-01-13 (×3): qty 20

## 2025-01-13 MED ORDER — IOHEXOL 350 MG/ML SOLN
75.0000 mL | Freq: Once | INTRAVENOUS | Status: AC | PRN
  Administered 2025-01-13: 75 mL via INTRAVENOUS

## 2025-01-13 MED ORDER — SODIUM CHLORIDE 0.9 % IV SOLN
500.0000 mg | Freq: Once | INTRAVENOUS | Status: AC
  Administered 2025-01-13: 500 mg via INTRAVENOUS
  Filled 2025-01-13: qty 5

## 2025-01-13 MED ORDER — LORATADINE 10 MG PO TABS
10.0000 mg | ORAL_TABLET | Freq: Every day | ORAL | Status: DC
  Administered 2025-01-13 – 2025-01-15 (×3): 10 mg via ORAL
  Filled 2025-01-13 (×3): qty 1

## 2025-01-13 MED ORDER — ALBUTEROL SULFATE (2.5 MG/3ML) 0.083% IN NEBU: 2.5000 mg | INHALATION_SOLUTION | RESPIRATORY_TRACT | Status: DC | PRN

## 2025-01-13 MED ORDER — MONTELUKAST SODIUM 10 MG PO TABS
10.0000 mg | ORAL_TABLET | Freq: Every day | ORAL | Status: DC
  Administered 2025-01-13 – 2025-01-14 (×2): 10 mg via ORAL
  Filled 2025-01-13 (×2): qty 1

## 2025-01-13 MED ORDER — SACUBITRIL-VALSARTAN 24-26 MG PO TABS
1.0000 | ORAL_TABLET | Freq: Two times a day (BID) | ORAL | Status: DC
  Administered 2025-01-13 – 2025-01-15 (×5): 1 via ORAL
  Filled 2025-01-13 (×6): qty 1

## 2025-01-13 MED ORDER — ACETAMINOPHEN 325 MG PO TABS
650.0000 mg | ORAL_TABLET | Freq: Four times a day (QID) | ORAL | Status: DC | PRN
  Administered 2025-01-14: 650 mg via ORAL
  Filled 2025-01-13: qty 2

## 2025-01-13 MED ORDER — AZITHROMYCIN 500 MG PO TABS
500.0000 mg | ORAL_TABLET | Freq: Every day | ORAL | Status: DC
  Administered 2025-01-13 – 2025-01-15 (×3): 500 mg via ORAL
  Filled 2025-01-13: qty 2
  Filled 2025-01-13 (×2): qty 1

## 2025-01-13 NOTE — ED Notes (Signed)
 Patient refuses to wear O2 at this tie stating he wants to wean himself off.

## 2025-01-13 NOTE — Progress Notes (Signed)
 Patient not refusing to wear O2, wanted to clarify in his chart, his O2 was 92% when he said he did not need to wear it at that time; he is willing and compliant with wearing oxygen  when needed. Also states he wears 2L O2 at home at night instead of CPAP and will wear this tonight.

## 2025-01-14 DIAGNOSIS — J189 Pneumonia, unspecified organism: Secondary | ICD-10-CM | POA: Diagnosis not present

## 2025-01-14 LAB — CBC
HCT: 43.3 % (ref 39.0–52.0)
Hemoglobin: 13.9 g/dL (ref 13.0–17.0)
MCH: 27.7 pg (ref 26.0–34.0)
MCHC: 32.1 g/dL (ref 30.0–36.0)
MCV: 86.3 fL (ref 80.0–100.0)
Platelets: 275 10*3/uL (ref 150–400)
RBC: 5.02 MIL/uL (ref 4.22–5.81)
RDW: 15.5 % (ref 11.5–15.5)
WBC: 10.2 10*3/uL (ref 4.0–10.5)
nRBC: 0 % (ref 0.0–0.2)

## 2025-01-14 LAB — BASIC METABOLIC PANEL WITH GFR
Anion gap: 8 (ref 5–15)
BUN: 11 mg/dL (ref 8–23)
CO2: 28 mmol/L (ref 22–32)
Calcium: 9.5 mg/dL (ref 8.9–10.3)
Chloride: 104 mmol/L (ref 98–111)
Creatinine, Ser: 0.59 mg/dL — ABNORMAL LOW (ref 0.61–1.24)
GFR, Estimated: >60 mL/min
Glucose, Bld: 123 mg/dL — ABNORMAL HIGH (ref 70–99)
Potassium: 4.1 mmol/L (ref 3.5–5.1)
Sodium: 140 mmol/L (ref 135–145)

## 2025-01-14 LAB — PRO BRAIN NATRIURETIC PEPTIDE: Pro Brain Natriuretic Peptide: 580 pg/mL — ABNORMAL HIGH

## 2025-01-14 NOTE — Progress Notes (Addendum)
 " PROGRESS NOTE    Arthur Church  FMW:[id number] DOB: 12/17/1955 DOA: 01/12/2025 PCP: Tammy Tari DASEN, PA-C    Brief Narrative:   Arthur Church is a 69 y.o. male with past medical history significant for combined systolic/diastolic congestive heart failure, HTN, CAD s/p CABG, persistent atrial fibrillation on Eliquis , mitral valve regurgitation, HLD, COPD on nocturnal 2L [place], prediabetes, anxiety, obesity who presented to [facility] ED on 01/12/2025 with shortness of breath and cough.  Has been persistent since December when he took a trip to [place] .  Reports yellow sputum production.  Reports over the last 5-6 weeks, 2 different antibiotic courses and oral steroids without improvement of symptoms.  Denies wheezing, no chest pain, no fevers.  In the ED, temperature 98.0 F, HR 89, RR 18, BP 162/91, SpO2 93% on room air.  WBC 12.7, hemoglobin 14.5, platelet count 309.  Sodium 138, potassium 4.3, chloride 100, CO2 30, creatinine 0.70, BUN 22, glucose 130.  Chest x-ray with no acute findings, cardiomegaly with CABG markers.  CT angiogram chest negative for pulmonary embolism, small amount of atelectasis/airspace disease left lower lobe, mildly enlarged heart with coronary and aortic atherosclerotic calcifications and sternotomy wires.  Started on azithromycin  and ceftriaxone  by EDP.  TRH consulted for admission for further evaluation management of commune acquired pneumonia despite outpatient antibiotic treatment.  Assessment & Plan:   Community-acquired pneumonia Patient presenting with persistent shortness of breath, cough with yellow sputum.  Previously completed a course of Augmentin  followed by doxycycline ; as well as steroids.  Patient is afebrile, WBC count elevated 12.7.  CT angiogram chest negative for PE but with airspace disease left lower lobe. -- Azithromycin  500 mg p.o. daily -- Ceftriaxone  2 g IV every 24 hours -- Weaned to room air as patient only requires nocturnal O2 at  baseline; goal SpO2 88%  Chronic combined systolic and diastolic congestive heart failure, compensated HTN Follows with cardiology outpatient, Dr. Mona.  Recently seen in clinic on 01/12/2025.  Most recent TTE May 2025 with LVEF 45-50%, mild decreased LV function with global hypokinesis, normal RV systolic function, moderate BAE, no significant valvular abnormalities. -- Carvedilol  6.25 m p.o. twice daily -- Entresto  24-26 mg p.o. twice daily -- Spironolactone  12.5 mg p.o. daily -- BNP elevated; TTE ordered at recent cardiology visit, given elevated BNP will obtain echocardiogram inpatient -- Strict I's and O's, daily weights  Persistent atrial fibrillation Underwent A-fib ablation February 2024.  Follows with cardiology outpatient. --Carvedilol  6.25 mg p.o. twice daily -- Eliquis  5 mg p.o. twice daily  HLD -- Atorvastatin  80 mg p.o. daily  Chronic hypoxic respiratory failure/COPD/emphysema on 2 L [place] Nocturnally Follows with pulmonology outpatient, Dr. Zaida. -- Breztri  2 puffs twice daily -- Montelukast  10 mg p.o. nightly -- Albuterol  neb every 2 hours.  Wheezing/shortness of breath  Anxiety -- Xanax  0.5 mg p.o. 3 times daily as needed anxiety  GERD -- Protonix  40 mams p.o. daily  Obesity, class III Body mass index is 47.71 kg/m.   DVT prophylaxis:  apixaban  (ELIQUIS ) tablet 5 mg    Code Status: Full Code Family Communication: No family present at bedside this morning  Disposition Plan:  Level of care: Telemetry Status is: Inpatient Remains inpatient appropriate because: IV antibiotics    Consultants:  None  Procedures:  None  Antimicrobials:  Azithromycin  1/31>> Ceftriaxone  1/31>>   Subjective: Patient seen examined bedside,  sitting edge of bed eating breakfast.  Continues with intermittent cough.  Currently on 2 L nasal cannula  with SpO2 95%.  Reports only uses oxygen  nocturnally at baseline.  No other specific questions or concerns at this time.   Denies headache, no dizziness, no chest pain, no palpitations, no abdominal pain, no fever/chills/night sweats, no nausea/vomit/diarrhea, no focal weakness, no fatigue, no paresthesias.  No acute events overnight per nurse staff.  Objective: Vitals:   01/14/25 0238 01/14/25 0538 01/14/25 0539 01/14/25 0924  BP: 136/72 (!) 104/57 127/72   Pulse: 98 78 (!) 59   Resp: 17 12    Temp: 99.5 F (37.5 C) 97.8 F (36.6 C)    TempSrc: Oral Oral    SpO2: 98% 94%  95%  Weight:      Height:        Intake/Output Summary (Last 24 hours) at 01/14/2025 1216 Last data filed at 01/14/2025 0550 Gross per 24 hour  Intake 700 ml  Output 700 ml  Net 0 ml   Filed Weights   01/12/25 2152 01/13/25 1440  Weight: (!) 158.8 kg (!) 159.6 kg    Examination:  Physical Exam: GEN: NAD, alert and oriented x 3, obese, chronically ill in appearance HEENT: NCAT, PERRL, EOMI, sclera clear, MMM PULM: CTAB w/o wheezes/crackles, normal respiratory effort, on 2 L nasal cannula SpO2 95% CV: RRR w/o M/G/R GI: abd soft, NTND, + BS MSK: no peripheral edema, moves all extremities independently NEURO: No focal neurological deficit PSYCH: normal mood/affect Integumentary: No concerning rashes/lesions/wounds noted on exposed skin surfaces    Data Reviewed: I have personally reviewed following labs and imaging studies  CBC: Recent Labs  Lab 01/12/25 2205 01/14/25 0621  WBC 12.7* 10.2  HGB 14.5 13.9  HCT 46.8 43.3  MCV 88.1 86.3  PLT 309 275   Basic Metabolic Panel: Recent Labs  Lab 01/12/25 2205 01/14/25 0621  NA 138 140  K 4.3 4.1  CL 100 104  CO2 30 28  GLUCOSE 130* 123*  BUN 22 11  CREATININE 0.70 0.59*  CALCIUM  9.9 9.5   GFR: Estimated Creatinine Clearance: 139.9 mL/min (A) (by C-G formula based on SCr of 0.59 mg/dL (L)). Liver Function Tests: No results for input(s): AST, ALT, ALKPHOS, BILITOT, PROT, ALBUMIN  in the last 168 hours. No results for input(s): LIPASE, AMYLASE in  the last 168 hours. No results for input(s): AMMONIA in the last 168 hours. Coagulation Profile: No results for input(s): INR, PROTIME in the last 168 hours. Cardiac Enzymes: No results for input(s): CKTOTAL, CKMB, CKMBINDEX, TROPONINI in the last 168 hours. BNP (last 3 results) Recent Labs    01/14/25 0621  PROBNP 580.0*   HbA1C: No results for input(s): HGBA1C in the last 72 hours. CBG: No results for input(s): GLUCAP in the last 168 hours. Lipid Profile: No results for input(s): CHOL, HDL, LDLCALC, TRIG, CHOLHDL, LDLDIRECT in the last 72 hours. Thyroid Function Tests: No results for input(s): TSH, T4TOTAL, FREET4, T3FREE, THYROIDAB in the last 72 hours. Anemia Panel: No results for input(s): VITAMINB12, FOLATE, FERRITIN, TIBC, IRON, RETICCTPCT in the last 72 hours. Sepsis Labs: Recent Labs  Lab 01/13/25 0904  LATICACIDVEN 0.9    No results found for this or any previous visit (from the past 240 hours).       Radiology Studies: CT Angio Chest PE W and/or Wo Contrast Result Date: 01/13/2025 EXAM: CTA CHEST 01/13/2025 12:39:47 AM TECHNIQUE: CTA of the chest was performed after the administration of intravenous contrast. Multiplanar reformatted images are provided for review. MIP images are provided for review. Automated exposure control, iterative reconstruction, and/or  weight based adjustment of the mA/kV was utilized to reduce the radiation dose to as low as reasonably achievable. COMPARISON: CT angiogram chest 04/19/2023. CLINICAL HISTORY: Lung/mediastinal abscess. FINDINGS: PULMONARY ARTERIES: Pulmonary arteries are adequately opacified for evaluation. No acute pulmonary embolus. Main pulmonary artery is normal in caliber. MEDIASTINUM: Coronary and aortic atherosclerotic calcifications are noted. The heart is moderately enlarged. Sternotomy wires are present. There is no acute abnormality of the thoracic aorta. LYMPH NODES:  No mediastinal, hilar or axillary lymphadenopathy. LUNGS AND PLEURA: There is mild elevation of the left hemidiaphragm. There is a small amount of atelectasis/airspace disease in the left lower lobe/left lung base. There is a band of atelectasis in the right middle lobe and right lower lobe. No focal consolidation or pulmonary edema. No evidence of pleural effusion or pneumothorax. UPPER ABDOMEN: Limited images of the upper abdomen are unremarkable. SOFT TISSUES AND BONES: No acute bone or soft tissue abnormality. IMPRESSION: 1. No evidence of pulmonary embolism. 2. Small amount of atelectasis/airspace disease in the left lower lobe. 3. Moderately enlarged heart with coronary and aortic atherosclerotic calcifications and sternotomy wires. Electronically signed by: Greig Pique MD 01/13/2025 02:04 AM EST RP Workstation: [id number]   DG Chest 2 View Result Date: 01/12/2025 EXAM: 2 VIEW(S) XRAY OF THE CHEST 01/12/2025 10:31:24 PM COMPARISON: 03/24/2024 CLINICAL HISTORY: Chest pain. FINDINGS: LUNGS AND PLEURA: No focal pulmonary opacity. No pleural effusion. No pneumothorax. HEART AND MEDIASTINUM: Cardiomegaly. CABG markers noted. BONES AND SOFT TISSUES: Sternotomy wires noted. Thoracic degenerative changes. IMPRESSION: 1. No acute findings. 2. Cardiomegaly with CABG markers. Electronically signed by: Greig Pique MD 01/12/2025 11:52 PM EST RP Workstation: [id number]        Scheduled Meds:  apixaban   5 mg Oral BID   atorvastatin   80 mg Oral Daily   azithromycin   500 mg Oral Daily   budesonide -glycopyrrolate -formoterol   2 puff Inhalation BID   carvedilol   6.25 mg Oral BID WC   loratadine   10 mg Oral Daily   magnesium  oxide  400 mg Oral BID   montelukast   10 mg Oral QHS   pantoprazole   40 mg Oral Daily   sacubitril -valsartan   1 tablet Oral BID   spironolactone   12.5 mg Oral Daily   Continuous Infusions:  cefTRIAXone  (ROCEPHIN )  IV 2 g (01/13/25 2051)     LOS: 1 day    Time spent: 52 minutes  spent on 01/14/2025 caring for this patient face-to-face including chart review, ordering labs/tests, documenting, discussion with nursing staff, consultants, updating family and interview/physical exam    Camellia PARAS Chrystina Naff, DO [facility] Available via Epic secure chat 7am-7pm After these hours, please refer to coverage provider listed on [email or web address] 01/14/2025, 12:16 PM   "

## 2025-01-14 NOTE — Progress Notes (Signed)
 Patient O2 while ambulating on room air 85%. Patient said his oxygen  drops then normally comes back up on it's own and wanted to wait and see. We walked back to the room and he sat in the chair, we waited and after sitting down it came back up to 94% after 2 minutes.

## 2025-01-15 ENCOUNTER — Other Ambulatory Visit (HOSPITAL_COMMUNITY): Payer: Self-pay

## 2025-01-15 ENCOUNTER — Inpatient Hospital Stay (HOSPITAL_COMMUNITY)

## 2025-01-15 ENCOUNTER — Other Ambulatory Visit: Payer: Self-pay

## 2025-01-15 DIAGNOSIS — R0609 Other forms of dyspnea: Secondary | ICD-10-CM | POA: Diagnosis not present

## 2025-01-15 DIAGNOSIS — J189 Pneumonia, unspecified organism: Secondary | ICD-10-CM | POA: Diagnosis not present

## 2025-01-15 LAB — ECHOCARDIOGRAM COMPLETE
AR max vel: 3.39 cm2
AV Area VTI: 2.49 cm2
AV Area mean vel: 3.59 cm2
AV Mean grad: 3 mmHg
AV Peak grad: 5 mmHg
Ao pk vel: 1.12 m/s
Area-P 1/2: 3.06 cm2
Height: 72 in
S' Lateral: 4.1 cm
Single Plane A4C EF: 64.5 %
Weight: 5636.72 [oz_av]

## 2025-01-15 LAB — CBC
HCT: 44.8 % (ref 39.0–52.0)
Hemoglobin: 13.7 g/dL (ref 13.0–17.0)
MCH: 27.1 pg (ref 26.0–34.0)
MCHC: 30.6 g/dL (ref 30.0–36.0)
MCV: 88.5 fL (ref 80.0–100.0)
Platelets: 277 10*3/uL (ref 150–400)
RBC: 5.06 MIL/uL (ref 4.22–5.81)
RDW: 15.6 % — ABNORMAL HIGH (ref 11.5–15.5)
WBC: 10.7 10*3/uL — ABNORMAL HIGH (ref 4.0–10.5)
nRBC: 0 % (ref 0.0–0.2)

## 2025-01-15 LAB — BASIC METABOLIC PANEL WITH GFR
Anion gap: 9 (ref 5–15)
BUN: 11 mg/dL (ref 8–23)
CO2: 28 mmol/L (ref 22–32)
Calcium: 9.9 mg/dL (ref 8.9–10.3)
Chloride: 100 mmol/L (ref 98–111)
Creatinine, Ser: 0.74 mg/dL (ref 0.61–1.24)
GFR, Estimated: >60 mL/min
Glucose, Bld: 103 mg/dL — ABNORMAL HIGH (ref 70–99)
Potassium: 4.9 mmol/L (ref 3.5–5.1)
Sodium: 137 mmol/L (ref 135–145)

## 2025-01-15 LAB — MISC LABCORP TEST (SEND OUT)
LabCorp test name: 83935
Labcorp test code: 83935

## 2025-01-15 LAB — PRO BRAIN NATRIURETIC PEPTIDE: Pro Brain Natriuretic Peptide: 300 pg/mL — ABNORMAL HIGH

## 2025-01-15 MED ORDER — LEVOFLOXACIN 750 MG PO TABS
750.0000 mg | ORAL_TABLET | Freq: Every day | ORAL | 0 refills | Status: AC
  Filled 2025-01-15 (×2): qty 3, 3d supply, fill #0

## 2025-01-15 MED ORDER — PERFLUTREN LIPID MICROSPHERE
1.0000 mL | INTRAVENOUS | Status: AC | PRN
  Administered 2025-01-15: 2 mL via INTRAVENOUS

## 2025-01-15 NOTE — Progress Notes (Signed)
" °  Echocardiogram 2D Echocardiogram has been performed.  Tinnie FORBES Gosling RDCS 01/15/2025, 10:51 AM "

## 2025-01-15 NOTE — Progress Notes (Signed)
 Discharge med in  a secure bag delivered to patient by this RN

## 2025-01-15 NOTE — Progress Notes (Signed)
 Mobility Specialist - Progress Note  (RA) Pre-mobility: 65 bpm HR, 97% SpO2 During mobility: 89% SpO2 Post-mobility: 73 bpm HR, 91% SPO2   01/15/25 1304  Mobility  Activity Ambulated independently  Level of Assistance Independent  Assistive Device None  Distance Ambulated (ft) 100 ft  Range of Motion/Exercises Active  Activity Response Tolerated fair  Mobility visit 1 Mobility  Mobility Specialist Start Time (ACUTE ONLY) 1254  Mobility Specialist Stop Time (ACUTE ONLY) 1304  Mobility Specialist Time Calculation (min) (ACUTE ONLY) 10 min   Pt was found in room and agreeable to mobilize. No complaints. At EOS returned to recliner chair. All needs met and call bell in reach.   Erminio Leos,  Mobility Specialist Can be reached via Secure Chat

## 2025-01-16 ENCOUNTER — Other Ambulatory Visit (HOSPITAL_COMMUNITY): Payer: Self-pay

## 2025-02-07 ENCOUNTER — Ambulatory Visit
